# Patient Record
Sex: Female | Born: 1955
Health system: Southern US, Community
[De-identification: ages and names within clinical notes are randomized; demographics above are authoritative.]

## PROBLEM LIST (undated history)

## (undated) DIAGNOSIS — F32A Depression, unspecified: Secondary | ICD-10-CM

## (undated) DIAGNOSIS — M1990 Unspecified osteoarthritis, unspecified site: Secondary | ICD-10-CM

## (undated) DIAGNOSIS — I10 Essential (primary) hypertension: Secondary | ICD-10-CM

## (undated) DIAGNOSIS — M069 Rheumatoid arthritis, unspecified: Secondary | ICD-10-CM

## (undated) DIAGNOSIS — F329 Major depressive disorder, single episode, unspecified: Secondary | ICD-10-CM

## (undated) HISTORY — DX: Unspecified osteoarthritis, unspecified site: M19.90

## (undated) HISTORY — PX: BREAST SURGERY: SHX581

## (undated) HISTORY — DX: Depression, unspecified: F32.A

## (undated) HISTORY — DX: Essential (primary) hypertension: I10

## (undated) HISTORY — DX: Rheumatoid arthritis, unspecified: M06.9

## (undated) HISTORY — DX: Major depressive disorder, single episode, unspecified: F32.9

## (undated) HISTORY — PX: BREAST EXCISIONAL BIOPSY: SUR124

## (undated) MED FILL — Adalimumab Auto-injector Kit 40 MG/0.4ML: SUBCUTANEOUS | Fill #0 | Status: CN

---

## 2019-01-08 ENCOUNTER — Ambulatory Visit (INDEPENDENT_AMBULATORY_CARE_PROVIDER_SITE_OTHER): Payer: PRIVATE HEALTH INSURANCE | Admitting: Family Medicine

## 2019-01-08 ENCOUNTER — Encounter: Payer: Self-pay | Admitting: Family Medicine

## 2019-01-08 VITALS — BP 112/75 | HR 79 | Temp 98.4°F | Ht 63.5 in | Wt 219.0 lb

## 2019-01-08 DIAGNOSIS — Z114 Encounter for screening for human immunodeficiency virus [HIV]: Secondary | ICD-10-CM

## 2019-01-08 DIAGNOSIS — I10 Essential (primary) hypertension: Secondary | ICD-10-CM | POA: Insufficient documentation

## 2019-01-08 DIAGNOSIS — Z1211 Encounter for screening for malignant neoplasm of colon: Secondary | ICD-10-CM

## 2019-01-08 DIAGNOSIS — M2550 Pain in unspecified joint: Secondary | ICD-10-CM | POA: Diagnosis not present

## 2019-01-08 DIAGNOSIS — Z800 Family history of malignant neoplasm of digestive organs: Secondary | ICD-10-CM | POA: Insufficient documentation

## 2019-01-08 DIAGNOSIS — Z8349 Family history of other endocrine, nutritional and metabolic diseases: Secondary | ICD-10-CM | POA: Insufficient documentation

## 2019-01-08 DIAGNOSIS — Z1159 Encounter for screening for other viral diseases: Secondary | ICD-10-CM

## 2019-01-08 MED ORDER — LOSARTAN POTASSIUM-HCTZ 100-12.5 MG PO TABS: 1.0000 | ORAL_TABLET | Freq: Every day | ORAL | 1 refills | Status: DC

## 2019-01-08 NOTE — Patient Instructions (Signed)
STOP the Amlodipine START the increased dose of Losartan/ HCTZ (100/12.5mg ).  Take ONE tablet a day.  Return in 1 week for recheck lab and blood pressure with the nurse.  We discussed that you are on high dose of the celebrex. We are checking labs to look for autoimmune arthritis like rheumatoid or lupus arthritis today.  If we find that you are positive for these markers in your blood, I will place the referral to the specialist.  I will contact you within the next 3 days with your lab results.  Schedule your mammogram when you check out today.  I am placing referral to the GI doctor to do your colonoscopy.

## 2019-01-08 NOTE — Progress Notes (Signed)
Subjective: WG:[id number] care,  HTN HPI: Jean Howell is a 63 y.o. female presenting to clinic today for:  1. HTN Patient with hypertension that was diagnosed 1 year ago.  She had been lost to follow-up for many years prior to that so unsure as to how long she has had elevated blood pressures.  She is been under good control with losartan 50/12.5 mg and Norvasc 10 mg daily.  She denies any chest pain, shortness of breath, lower extremity edema but does note she has "tingling in both legs" since starting Norvasc 10 mg.  She does not find that amitriptyline changes this.  She is on chronic NSAID with Celebrex for arthritis.  She is a non-smoker.  No known family history of heart disease but there was a history of CVA in her mother she thinks in her mother 1s.  2.  Polyarthralgia Patient reports longstanding history of polyarthritis.  She feels like symptoms got worse in February of last year and she was started on Celebrex by her previous PCP.  She currently takes 200 mg p.o. twice daily and amitriptyline 25 mg nightly.  She does report some improvement at nighttime because the amitriptyline helps her sleep.  However, during the daytime she continues to have arthritic pain.  She wears braces in bilateral hands.  She reports pain is present in bilateral hands, fingers, wrists, knees and left ankle.  She has some joint swelling within the hands.  She has pins-and-needles sensation in bilateral lower extremities but does not endorse any numbness or tingling in the hands.  She does not work and she attributes this to symptomatic arthritis.  3. Preventative care Patient has never had a colonoscopy but does report that there was a history of colon cancer in her father.  Denies any rectal bleeding, early satiety, unplanned weight loss.  She has not had a mammogram or Pap smear in some time now.  She declines all vaccinations.  Unsure if she is ever been screened for HIV or hepatitis C.  Past Medical  History:  Diagnosis Date  . Arthritis   . Depression   . Hypertension    Past Surgical History:  Procedure Laterality Date  . BREAST SURGERY     nodules removed   Social History   Socioeconomic History  . Marital status: Widowed    Spouse name: Not on file  . Number of children: 1  . Years of education: Not on file  . Highest education level: Not on file  Occupational History  . Not on file  Social Needs  . Financial resource strain: Not on file  . Food insecurity:    Worry: Not on file    Inability: Not on file  . Transportation needs:    Medical: Not on file    Non-medical: Not on file  Tobacco Use  . Smoking status: Former Smoker    Packs/day: 1.00    Years: 15.00    Pack years: 15.00    Types: Cigarettes    Last attempt to quit: 2000    Years since quitting: 20.1  Substance and Sexual Activity  . Alcohol use: Not Currently  . Drug use: Never  . Sexual activity: Not Currently    Comment: widowed  Lifestyle  . Physical activity:    Days per week: Not on file    Minutes per session: Not on file  . Stress: Not on file  Relationships  . Social connections:    Talks on phone: Not on file  Gets together: Not on file    Attends religious service: Not on file    Active member of club or organization: Not on file    Attends meetings of clubs or organizations: Not on file    Relationship status: Not on file  . Intimate partner violence:    Fear of current or ex partner: Not on file    Emotionally abused: Not on file    Physically abused: Not on file    Forced sexual activity: Not on file  Other Topics Concern  . Not on file  Social History Narrative   Patient is a widow that resides independently in [place].  She has 1 son, who resides in [place].   Current Meds  Medication Sig  . amitriptyline (ELAVIL) 25 MG tablet Take 25 mg by mouth at bedtime.  . celecoxib (CELEBREX) 200 MG capsule Take 200 mg by mouth 2 (two) times daily.  . [DISCONTINUED]  amLODipine (NORVASC) 10 MG tablet Take 10 mg by mouth daily.  . [DISCONTINUED] losartan-hydrochlorothiazide (HYZAAR) 50-12.5 MG tablet Take 1 tablet by mouth daily.   Family History  Problem Relation Age of Onset  . Cancer Father   . Colon cancer Father   . Thyroid disease Mother   . CVA Mother 37  . Thyroid disease Sister    Allergies not on file   Health Maintenance: Colonscopy, declines vaccines. Mammogram and pap also due.  ROS: Per HPI  Objective: Office vital signs reviewed. BP 112/75   Pulse 79   Temp 98.4 F (36.9 C) (Oral)   Ht 5' 3.5" (1.613 m)   Wt 219 lb (99.3 kg)   BMI 38.19 kg/m   Physical Examination:  General: Awake, alert, well nourished, No acute distress HEENT: Normal, sclera white, MMM Cardio: regular rate and rhythm, S1S2 heard, no murmurs appreciated Pulm: clear to auscultation bilaterally, no wheezes, rhonchi or rales; normal work of breathing on room air Extremities: warm, well perfused, No edema, cyanosis or clubbing; +2 pulses bilaterally MSK: slow/stiff gait and station  Hands: She has hyperpigmentation and joint swelling noted within bilateral DIP joints of the third fingers and in the PIP joint of the second finger of the left hand.  No increased warmth.  Range of motion is somewhat limited secondary to stiffness. Skin: dry; intact; no rashes or lesions Neuro: light touch sensation grossly in tact Psych: mood stable, speech normal.  Pleasant. Depression screen [facility] 2/9 01/08/2019  Decreased Interest 0  Down, Depressed, Hopeless 0  PHQ - 2 Score 0  Altered sleeping 0  Tired, decreased energy 0  Change in appetite 0  Feeling bad or failure about yourself  0  Trouble concentrating 0  Moving slowly or fidgety/restless 0  Suicidal thoughts 0  PHQ-9 Score 0    Assessment/ Plan: 63 y.o. female   1. Essential hypertension Under excellent control but patient feels that the Norvasc has caused side effects within the lower extremities.  We will  trial off of the Norvasc though if and instructed her to keep the medication for now.  Will increase losartan to 100 mg with remaining 12.5 mg of hydrochlorothiazide.  We discussed this dose change.  New prescription has been sent to pharmacy.  She will return in 1 week for BMP check.  In the meantime, will check nonfasting labs of A1c, lipid panel, metabolic panel.  I will contact you with results once available - Lipid Panel - Bayer DCA Hb A1c Waived - CMP14+EGFR - losartan-hydrochlorothiazide (HYZAAR) 100-12.5  MG tablet; Take 1 tablet by mouth daily.  Dispense: 30 tablet; Refill: 1  2. Morbid obesity ([place]) - TSH - Bayer DCA Hb A1c Waived  3. Family history of thyroid disease - TSH  4. Polyarthralgia ?  Autoimmune arthritis given joint changes within the DIP joints of bilateral hands.  Check ANA, RF, sed rate and CRP.  For now, continue Celebrex at max dose and amitriptyline - amitriptyline (ELAVIL) 25 MG tablet; Take 25 mg by mouth at bedtime. - CMP14+EGFR - CBC - ANA w/Reflex if Positive - Rheumatoid factor - Sedimentation Rate - C-reactive protein  5. Encounter for hepatitis C screening test for low risk patient - Hepatitis C antibody  6. Screening for HIV without presence of risk factors - HIV antibody (with reflex)  7. Screen for colon cancer - Ambulatory referral to Gastroenterology  8. Family history of colon cancer - Ambulatory referral to Gastroenterology   [name], [place] [phone]

## 2019-01-09 ENCOUNTER — Other Ambulatory Visit: Payer: Self-pay | Admitting: Family Medicine

## 2019-01-09 DIAGNOSIS — M0580 Other rheumatoid arthritis with rheumatoid factor of unspecified site: Secondary | ICD-10-CM

## 2019-01-09 LAB — BAYER DCA HB A1C WAIVED: HB A1C (BAYER DCA - WAIVED): 5.3 % (ref <7.0)

## 2019-01-09 NOTE — Progress Notes (Signed)
r 

## 2019-01-11 LAB — HEPATITIS C ANTIBODY: Hep C Virus Ab: <0.1 s/co ratio (ref 0.0–0.9)

## 2019-01-11 LAB — CMP14+EGFR
ALT: 13 IU/L (ref 0–32)
AST: 12 IU/L (ref 0–40)
Albumin/Globulin Ratio: 0.9 — ABNORMAL LOW (ref 1.2–2.2)
Albumin: 3.7 g/dL — ABNORMAL LOW (ref 3.8–4.8)
Alkaline Phosphatase: 90 IU/L (ref 39–117)
BUN/Creatinine Ratio: 19 (ref 12–28)
BUN: 25 mg/dL (ref 8–27)
Bilirubin Total: 0.3 mg/dL (ref 0.0–1.2)
CHLORIDE: 99 mmol/L (ref 96–106)
CO2: 25 mmol/L (ref 20–29)
Calcium: 9.4 mg/dL (ref 8.7–10.3)
Creatinine, Ser: 1.35 mg/dL — ABNORMAL HIGH (ref 0.57–1.00)
GFR calc Af Amer: 48 mL/min/{1.73_m2} — ABNORMAL LOW (ref >59)
GFR calc non Af Amer: 42 mL/min/{1.73_m2} — ABNORMAL LOW (ref >59)
Globulin, Total: 3.9 g/dL (ref 1.5–4.5)
Glucose: 98 mg/dL (ref 65–99)
Potassium: 4.3 mmol/L (ref 3.5–5.2)
Sodium: 141 mmol/L (ref 134–144)
Total Protein: 7.6 g/dL (ref 6.0–8.5)

## 2019-01-11 LAB — ANA W/REFLEX IF POSITIVE
Anti JO-1: <0.2 AI (ref 0.0–0.9)
Anti Nuclear Antibody(ANA): POSITIVE — AB
Centromere Ab Screen: <0.2 AI (ref 0.0–0.9)
Chromatin Ab SerPl-aCnc: <0.2 AI (ref 0.0–0.9)
ENA RNP Ab: <0.2 AI (ref 0.0–0.9)
ENA SM Ab Ser-aCnc: <0.2 AI (ref 0.0–0.9)
ENA SSA (RO) Ab: <0.2 AI (ref 0.0–0.9)
ENA SSB (LA) Ab: <0.2 AI (ref 0.0–0.9)
Scleroderma SCL-70: 1.7 AI — ABNORMAL HIGH (ref 0.0–0.9)
dsDNA Ab: <1 IU/mL (ref 0–9)

## 2019-01-11 LAB — SEDIMENTATION RATE: Sed Rate: 1 mm/hr (ref 0–40)

## 2019-01-11 LAB — CBC
HEMATOCRIT: 34.5 % (ref 34.0–46.6)
Hemoglobin: 11.5 g/dL (ref 11.1–15.9)
MCH: 26.9 pg (ref 26.6–33.0)
MCHC: 33.3 g/dL (ref 31.5–35.7)
MCV: 81 fL (ref 79–97)
Platelets: 456 10*3/uL — ABNORMAL HIGH (ref 150–450)
RBC: 4.27 x10E6/uL (ref 3.77–5.28)
RDW: 15.3 % (ref 11.7–15.4)
WBC: 7.7 10*3/uL (ref 3.4–10.8)

## 2019-01-11 LAB — HIV ANTIBODY (ROUTINE TESTING W REFLEX): HIV Screen 4th Generation wRfx: NONREACTIVE

## 2019-01-11 LAB — LIPID PANEL
Chol/HDL Ratio: 3 ratio (ref 0.0–4.4)
Cholesterol, Total: 168 mg/dL (ref 100–199)
HDL: 56 mg/dL (ref >39)
LDL Calculated: 97 mg/dL (ref 0–99)
Triglycerides: 77 mg/dL (ref 0–149)
VLDL CHOLESTEROL CAL: 15 mg/dL (ref 5–40)

## 2019-01-11 LAB — TSH: TSH: 1.16 u[IU]/mL (ref 0.450–4.500)

## 2019-01-11 LAB — RHEUMATOID FACTOR: Rheumatoid fact SerPl-aCnc: 100.6 IU/mL — ABNORMAL HIGH (ref 0.0–13.9)

## 2019-01-11 LAB — C-REACTIVE PROTEIN: CRP: 47 mg/L — ABNORMAL HIGH (ref 0–10)

## 2019-01-15 ENCOUNTER — Ambulatory Visit (INDEPENDENT_AMBULATORY_CARE_PROVIDER_SITE_OTHER): Payer: PRIVATE HEALTH INSURANCE | Admitting: *Deleted

## 2019-01-15 VITALS — BP 127/82 | HR 83

## 2019-01-15 DIAGNOSIS — Z0130 Encounter for examination of blood pressure without abnormal findings: Secondary | ICD-10-CM

## 2019-01-15 NOTE — Progress Notes (Signed)
  Bp here today for Bp Check Blood pressure reading 127/82 pulse 83

## 2019-01-15 NOTE — Progress Notes (Signed)
BP under good control.  Ok to stay off Norvasc and continue ARB/ HCTZ combo.

## 2019-01-30 LAB — HM MAMMOGRAPHY

## 2019-02-09 ENCOUNTER — Other Ambulatory Visit: Payer: Self-pay | Admitting: Family Medicine

## 2019-02-09 DIAGNOSIS — I10 Essential (primary) hypertension: Secondary | ICD-10-CM

## 2019-02-12 ENCOUNTER — Encounter: Payer: Self-pay | Admitting: Family Medicine

## 2019-02-12 ENCOUNTER — Ambulatory Visit (INDEPENDENT_AMBULATORY_CARE_PROVIDER_SITE_OTHER): Payer: PRIVATE HEALTH INSURANCE | Admitting: Family Medicine

## 2019-02-12 VITALS — BP 127/79 | HR 89 | Temp 98.4°F | Ht 63.5 in | Wt 229.0 lb

## 2019-02-12 DIAGNOSIS — R768 Other specified abnormal immunological findings in serum: Secondary | ICD-10-CM | POA: Diagnosis not present

## 2019-02-12 DIAGNOSIS — R7689 Other specified abnormal immunological findings in serum: Secondary | ICD-10-CM | POA: Insufficient documentation

## 2019-02-12 DIAGNOSIS — I10 Essential (primary) hypertension: Secondary | ICD-10-CM

## 2019-02-12 DIAGNOSIS — M2550 Pain in unspecified joint: Secondary | ICD-10-CM

## 2019-02-12 MED ORDER — LOSARTAN POTASSIUM-HCTZ 100-12.5 MG PO TABS: ORAL_TABLET | ORAL | 0 refills | Status: DC

## 2019-02-12 NOTE — Progress Notes (Signed)
Subjective: CC: Arthritis, HTN HPI: Jean Howell is a 63 y.o. female presenting to clinic today for:  1. HTN Reports compliance with losartan hydrochlorothiazide.  Doing well after discontinuation of Norvasc.  No chest pain, shortness of breath, dizziness.  2.  Polyarthralgia Noted to have positive rheumatoid markers, positive ANA, elevated CRP and mildly elevated platelets.  Referral to rheumatology in place and patient aware of appointment.  She continues to have various aches and pains but states that the arthritis actually seems a little bit better than last visit.  3. Preventative care Scheduled to see GI for colonoscopy.  Past Medical History:  Diagnosis Date  . Arthritis   . Depression   . Hypertension    Past Surgical History:  Procedure Laterality Date  . BREAST SURGERY     nodules removed   Social History   Socioeconomic History  . Marital status: Widowed    Spouse name: Not on file  . Number of children: 1  . Years of education: Not on file  . Highest education level: Not on file  Occupational History  . Not on file  Social Needs  . Financial resource strain: Not on file  . Food insecurity:    Worry: Not on file    Inability: Not on file  . Transportation needs:    Medical: Not on file    Non-medical: Not on file  Tobacco Use  . Smoking status: Former Smoker    Packs/day: 1.00    Years: 15.00    Pack years: 15.00    Types: Cigarettes    Last attempt to quit: 2000    Years since quitting: 20.2  . Smokeless tobacco: Never Used  Substance and Sexual Activity  . Alcohol use: Not Currently  . Drug use: Never  . Sexual activity: Not Currently    Comment: widowed  Lifestyle  . Physical activity:    Days per week: Not on file    Minutes per session: Not on file  . Stress: Not on file  Relationships  . Social connections:    Talks on phone: Not on file    Gets together: Not on file    Attends religious service: Not on file    Active member of  club or organization: Not on file    Attends meetings of clubs or organizations: Not on file    Relationship status: Not on file  . Intimate partner violence:    Fear of current or ex partner: Not on file    Emotionally abused: Not on file    Physically abused: Not on file    Forced sexual activity: Not on file  Other Topics Concern  . Not on file  Social History Narrative   Patient is a widow that resides independently in [place].  She has 1 son, who resides in [place].   No outpatient medications have been marked as taking for the 02/12/19 encounter (Appointment) with [name], DO.   Family History  Problem Relation Age of Onset  . Cancer Father   . Colon cancer Father   . Thyroid disease Mother   . CVA Mother 23  . Thyroid disease Sister    No Known Allergies   Health Maintenance: Colonscopy, declines vaccines. Mammogram and pap also due.  ROS: Per HPI  Objective: Office vital signs reviewed. BP 127/79   Pulse 89   Temp 98.4 F (36.9 C) (Oral)   Ht 5' 3.5" (1.613 m)   Wt 229 lb (103.9  kg)   BMI 39.93 kg/m   Physical Examination:  General: Awake, alert, well nourished, No acute distress HEENT: Normal, sclera white, MMM Cardio: regular rate and rhythm, S1S2 heard, no murmurs appreciated Pulm: clear to auscultation bilaterally, no wheezes, rhonchi or rales; normal work of breathing on room air Extremities: warm, well perfused, No edema, cyanosis or clubbing; +2 pulses bilaterally Psych: mood stable, speech normal.  Pleasant. Depression screen [facility] 2/9 02/12/2019 01/08/2019  Decreased Interest 0 0  Down, Depressed, Hopeless 0 0  PHQ - 2 Score 0 0  Altered sleeping 0 0  Tired, decreased energy 0 0  Change in appetite 0 0  Feeling bad or failure about yourself  0 0  Trouble concentrating 0 0  Moving slowly or fidgety/restless 0 0  Suicidal thoughts 0 0  PHQ-9 Score 0 0    Assessment/ Plan: 63 y.o. female   1. Essential hypertension Under  excellent control despite discontinuation of Norvasc.  Refill of losartan/hydrochlorthiazide sent to pharmacy.  Follow-up in 3 months.  Plan for repeat renal panel.  Advised to discontinue twice daily dosing of Celebrex. - losartan-hydrochlorothiazide (HYZAAR) 100-12.5 MG tablet; TAKE ONE (1) TABLET EACH DAY  Dispense: 90 tablet; Refill: 0  2. Polyarthralgia Okay to continue once daily dosing of Celebrex if needed.  Plan for repeat renal function panel at next visit.  Polyarthralgia likely due to underlying rheumatoid arthritis given positive RA latex and ANA.  She has an appointment scheduled with rheumatology in 2 weeks.  Follow-up in 3 months with me.  3. ANA positive  4. Rheumatoid factor positive   [name], DO [place] [phone]

## 2019-02-12 NOTE — Progress Notes (Signed)
Office Visit Note  Patient: Jean Howell             Date of Birth: [birth year]           MRN: [id number]             PCP: [name], DO Referring: [name], DO Visit Date: 02/26/2019 Occupation: @GUAROCC @  Subjective:  Pain in multiple joints.   History of Present Illness: Jean Howell is a 63 y.o. femalewith history of inflammatory arthritis and positive rheumatoid factor.  According to patient about 1 year ago she started experiencing morning stiffness.  She states she had difficulty getting out of the bed.  She states she was experiencing pain in her shoulders, wrist joints, hands, knees and ankles for many months now.  She also had some neck and lower back pain.  She states she suffered for a while and took [facility] powders Tylenol and over-the-counter medications.  She states in April 2019 she was seen by her PCP at the time she was placed on Celebrex and Tylenol combination.  She states she felt some better but her symptoms never did resolve.  She continued to have joint pain and joint swelling.  She states recently she has been experiencing a lot of discomfort in her joints and swelling in her both hands her right knee joint and left ankle joint.  She has difficulty doing routine activities.  Activities of Daily Living:  Patient reports morning stiffness for 4 hours.   Patient Reports nocturnal pain.  Difficulty dressing/grooming: Reports Difficulty climbing stairs: Reports Difficulty getting out of chair: Reports Difficulty using hands for taps, buttons, cutlery, and/or writing: Reports  Review of Systems  Constitutional: Positive for fatigue. Negative for night sweats, weight gain and weight loss.  HENT: Positive for mouth dryness. Negative for mouth sores, trouble swallowing, trouble swallowing and nose dryness.   Eyes: Positive for dryness. Negative for pain, redness and visual disturbance.  Respiratory: Negative for cough, shortness of breath and difficulty  breathing.   Cardiovascular: Negative for chest pain, palpitations, hypertension, irregular heartbeat and swelling in legs/feet.  Gastrointestinal: Positive for constipation. Negative for blood in stool and diarrhea.  Endocrine: Negative for increased urination.  Genitourinary: Negative for vaginal dryness.  Musculoskeletal: Positive for arthralgias, joint pain, joint swelling and morning stiffness. Negative for myalgias, muscle weakness, muscle tenderness and myalgias.  Skin: Negative for color change, rash, hair loss, skin tightness, ulcers and sensitivity to sunlight.  Allergic/Immunologic: Negative for susceptible to infections.  Neurological: Negative for dizziness, memory loss, night sweats and weakness.  Hematological: Negative for swollen glands.  Psychiatric/Behavioral: Positive for depressed mood and sleep disturbance. The patient is not nervous/anxious.     PMFS History:  Patient Active Problem List   Diagnosis Date Noted  . ANA positive 02/12/2019  . Family history of thyroid disease 01/08/2019  . Morbid obesity ([place]) 01/08/2019  . Essential hypertension 01/08/2019  . Polyarthralgia 01/08/2019  . Family history of colon cancer 01/08/2019    Past Medical History:  Diagnosis Date  . Arthritis   . Depression   . Hypertension     Family History  Problem Relation Age of Onset  . Cancer Father   . Colon cancer Father   . Thyroid disease Mother   . CVA Mother 70  . Thyroid disease Sister   . Healthy Son   . Congestive Heart Failure Sister   . Hypertension Sister   . Diabetes Sister   . Hypertension Sister   .  Diabetes Sister   . Hypertension Sister   . Hypertension Sister   . Cancer Brother        kidney    Past Surgical History:  Procedure Laterality Date  . BREAST SURGERY     nodules removed   Social History   Social History Narrative   Patient is a widow that resides independently in [place].  She has 1 son, who resides in [place].    There  is no immunization history on file for this patient.   Objective: Vital Signs: BP (!) 145/102 (BP Location: Right Arm, Patient Position: Sitting, Cuff Size: Normal)   Pulse 76   Resp 14   Ht 5' 4.5" (1.638 m)   Wt 229 lb (103.9 kg)   BMI 38.70 kg/m    Physical Exam Vitals signs and nursing note reviewed.  Constitutional:      Appearance: She is well-developed.  HENT:     Head: Normocephalic and atraumatic.  Eyes:     Conjunctiva/sclera: Conjunctivae normal.  Neck:     Musculoskeletal: Normal range of motion.  Cardiovascular:     Rate and Rhythm: Normal rate and regular rhythm.     Heart sounds: Normal heart sounds.  Pulmonary:     Effort: Pulmonary effort is normal.     Breath sounds: Rales present.     Comments: Bilateral lung bases Abdominal:     General: Bowel sounds are normal.     Palpations: Abdomen is soft.  Lymphadenopathy:     Cervical: No cervical adenopathy.  Skin:    General: Skin is warm and dry.     Capillary Refill: Capillary refill takes less than 2 seconds.  Neurological:     Mental Status: She is alert and oriented to person, place, and time.  Psychiatric:        Behavior: Behavior normal.      Musculoskeletal Exam: C-spine her lumbar spine limited range of motion.  She has painful range of motion of bilateral shoulder joints.  Elbow joints with good range of motion.  Wrist joints with good range of motion without any swelling.  She has some tenderness that is across MCPs and PIPs without any swelling.  She has right second DIP thickening.  Hip joints with good range of motion.  Right knee joint had limited extension with warmth and swelling.  Left ankle joint was also warm and swollen.  She is some tenderness across MTPs and PIPs but no synovitis was noted.  CDAI Exam: CDAI Score: Not documented Patient Global Assessment: Not documented; Provider Global Assessment: Not documented Swollen: Not documented; Tender: Not documented Joint Exam   Not  documented   There is currently no information documented on the homunculus. Go to the Rheumatology activity and complete the homunculus joint exam.  Investigation: Findings:  01/08/19: RF 100.6, sed rate 1, CRP 47, Scl-70 1.7, ANA+, dsDNA-, RNP-, Ro-, La-, smith-, Hep C ab-, HIV-  Component     Latest Ref Rng & Units 01/08/2019  Anti Nuclear Antibody(ANA)     Negative Positive (A)  dsDNA Ab     0 - 9 IU/mL <1  ENA RNP Ab     0.0 - 0.9 AI <0.2  ENA SM Ab Ser-aCnc     0.0 - 0.9 AI <0.2  Scleroderma SCL-70     0.0 - 0.9 AI 1.7 (H)  ENA SSA (RO) Ab     0.0 - 0.9 AI <0.2  ENA SSB (LA) Ab  0.0 - 0.9 AI <0.2  Chromatin Ab SerPl-aCnc     0.0 - 0.9 AI <0.2  Anti JO-1     0.0 - 0.9 AI <0.2  CENTROMERE AB SCREEN     0.0 - 0.9 AI <0.2  SEE BELOW      Comment  RA Latex Turbid.     0.0 - 13.9 IU/mL 100.6 (H)  Sed Rate     0 - 40 mm/hr 1  CRP     0 - 10 mg/L 47 (H)  Hep C Virus Ab     0.0 - 0.9 s/co ratio <0.1  HIV Screen 4th Generation wRfx     Non Reactive Non Reactive   Imaging: Hm Mammography  Result Date: 01/30/2019 [facility] Cat 2   Recent Labs: Lab Results  Component Value Date   WBC 7.7 01/08/2019   HGB 11.5 01/08/2019   PLT 456 (H) 01/08/2019   NA 141 01/08/2019   K 4.3 01/08/2019   CL 99 01/08/2019   CO2 25 01/08/2019   GLUCOSE 98 01/08/2019   BUN 25 01/08/2019   CREATININE 1.35 (H) 01/08/2019   BILITOT 0.3 01/08/2019   ALKPHOS 90 01/08/2019   AST 12 01/08/2019   ALT 13 01/08/2019   PROT 7.6 01/08/2019   ALBUMIN 3.7 (L) 01/08/2019   CALCIUM 9.4 01/08/2019   GFRAA 48 (L) 01/08/2019    Speciality Comments: No specialty comments available.  Procedures:  No procedures performed Allergies: Patient has no known allergies.   Assessment / Plan:     Visit Diagnoses: Rheumatoid factor positive - 01/08/19: RF 100.6, sed rate 1, CRP 47, Scl-70 1.7, ANA+, dsDNA-, RNP-, Ro-, La-, smith-, Hep C ab-, HIV-.  Patient is  significantly high rheumatoid factor.  She also complains of significant morning stiffness generalized pain and intermittent swelling in multiple joints.  She had swelling in her right knee and left ankle today.  Most likely diagnosis is rheumatoid arthritis.  I will obtain some additional labs and x-rays today.  Chronic pain of both shoulders -she had painful range of motion of bilateral shoulders today.  Plan: XR Shoulder Left  Chronic right shoulder pain - Plan: XR Shoulder Right.  The x-ray of shoulder joints were unremarkable.  Pain in both hands -painful range of motion of bilateral hands with tenderness over MCPs and PIPs.  No obvious synovitis was noted.  She is in complete fist formation with the right hand.  Plan: XR Hand 2 View Right, XR Hand 2 View Left, 14-3-3 eta Protein, Rheumatoid factor.  X-rays were consistent with rheumatoid arthritis and osteoarthritis overlap.  Chronic pain of both knees -she has warmth and swelling in her right knee joint.  Plan: XR KNEE 3 VIEW RIGHT, XR KNEE 3 VIEW LEFT.  Right knee joint showed severe osteoarthritis and inflammatory arthritis .  Left knee joint showed moderate osteoarthritis.  Pain in both feet -she had tenderness across MTPs and PIPs.  She also had left ankle joint swelling.  Plan: XR Foot 2 Views Right, XR Foot 2 Views Left.  X-rays were consistent with inflammatory and osteoarthritis overlap.  Former smoker-patient had crackles in her lung bases.  Due to pandemic issue currently I would hold off x-ray of her chest until next visit.  I plan to obtain this x-ray next visit.  High risk medication use - Plan: Urinalysis, Routine w reflex microscopic, Hepatitis B core antibody, IgM, Hepatitis B surface antigen, HIV Antibody (routine testing w  rflx), QuantiFERON-TB Gold Plus, Serum protein electrophoresis with reflex, IgG, IgA, IgM, Glucose 6 phosphate dehydrogenase  Other fatigue - Plan: CK   ANA positive  Essential hypertension  History  of depression  Family history of thyroid disease  Family history of colon cancer    Orders: Orders Placed This Encounter  Procedures  . XR Shoulder Left  . XR Shoulder Right  . XR Hand 2 View Right  . XR Hand 2 View Left  . XR KNEE 3 VIEW RIGHT  . XR KNEE 3 VIEW LEFT  . XR Foot 2 Views Right  . XR Foot 2 Views Left  . Urinalysis, Routine w reflex microscopic  . CK  . 14-3-3 eta Protein  . Rheumatoid factor  . Hepatitis B core antibody, IgM  . Hepatitis B surface antigen  . QuantiFERON-TB Gold Plus  . Serum protein electrophoresis with reflex  . IgG, IgA, IgM  . Glucose 6 phosphate dehydrogenase   No orders of the defined types were placed in this encounter.   Face-to-face time spent with patient was 50 minutes. Greater than 50% of time was spent in counseling and coordination of care.  Follow-Up Instructions: Return for arthritis and positive rheumatoid factor.   Bo Merino, MD  Note - This record has been created using Editor, commissioning.  Chart creation errors have been sought, but may not always  have been located. Such creation errors do not reflect on  the standard of medical care.

## 2019-02-12 NOTE — Patient Instructions (Signed)
Blood pressure looks good on just the Losartan/ hydrochlorothiazide pill.  I refilled this today.  Take ONLY 1 capsule of the celebrex a day.  I think this is what may have caused your kidney function issue last check.  We will recheck your kidney function at your next visit.  See me in 3 months for recheck.   Rheumatoid Arthritis Rheumatoid arthritis (RA) is a long-term (chronic) disease. RA causes inflammation in your joints. Your joints may feel painful, stiff, swollen, and warm. RA may start slowly. It most often affects the small joints of the hands and feet. It can also affect other parts of the body. Symptoms of RA often come and go. There is no cure for RA, but medicines can help your symptoms. What are the causes?  RA is an autoimmune disease. This means that your body's defense system (immune system) attacks healthy parts of your body by mistake. The exact cause of RA is not known. What increases the risk?  Being a woman.  Having a family history of RA or other diseases like RA.  Smoking.  Being overweight.  Being exposed to pollutants or chemicals. What are the signs or symptoms?  Morning stiffness that lasts longer than 30 minutes. This is often the first symptom.  Symptoms start slowly. They are often worse in the morning.  As RA gets worse, symptoms may include: ? Pain, stiffness, swelling, warmth, and tenderness in joints on both sides of your body. ? Loss of energy. ? Not feeling hungry. ? Weight loss. ? A low fever. ? Dry eyes and a dry mouth. ? Firm lumps that grow under your skin. ? Changes in the way your joints look. ? Changes in the way your joints work.  Symptoms vary and they: ? Often come and go. ? Sometimes get worse for a period of time. These are called flares. How is this treated?   Treatment may include: ? Taking good care of yourself. Be sure to rest as needed, eat a healthy diet, and exercise. ? Medicines. These may include:  Pain  relievers.  Medicines to help with inflammation.  Disease-modifying antirheumatic drugs (DMARDs).  Medicines called biologic response modifiers. ? Physical therapy and occupational therapy. ? Surgery, if joint damage is very bad. Your doctor will work with you to find the best treatments. Follow these instructions at home: Activity  Return to your normal activities as told by your doctor. Ask your doctor what activities are safe for you.  Rest when you have a flare.  Exercise as told by your doctor. General instructions  Take over-the-counter and prescription medicines only as told by your doctor.  Keep all follow-up visits as told by your doctor. This is important. Where to find more information  [employer]: www.rheumatology.[place]: www.arthritis.org Contact a doctor if:  You have a flare.  You have a fever.  You have problems because of your medicines. Get help right away if:  You have chest pain.  You have trouble breathing.  You get a hot, painful joint all of a sudden, and it is worse than your normal joint aches. Summary  RA is a long-term disease.  Symptoms of RA start slowly. They are often worse in the morning.  RA causes inflammation in your joints. This information is not intended to replace advice given to you by your health care provider. Make sure you discuss any questions you have with your health care provider. Document Released: 02/14/2012 Document Revised: 07/26/2018  Document Reviewed: 07/26/2018 Elsevier Interactive Patient Education  [employer].

## 2019-02-19 ENCOUNTER — Other Ambulatory Visit: Payer: Self-pay

## 2019-02-19 ENCOUNTER — Ambulatory Visit (INDEPENDENT_AMBULATORY_CARE_PROVIDER_SITE_OTHER): Payer: Self-pay | Admitting: *Deleted

## 2019-02-19 DIAGNOSIS — Z1211 Encounter for screening for malignant neoplasm of colon: Secondary | ICD-10-CM

## 2019-02-19 MED ORDER — NA SULFATE-K SULFATE-MG SULF 17.5-3.13-1.6 GM/177ML PO SOLN: 1.0000 | Freq: Once | ORAL | 0 refills | Status: AC

## 2019-02-19 NOTE — Patient Instructions (Addendum)
Jean Howell  1956/08/28 MRN: [id number]     Procedure Date: 06/25/2019 Time to register: 11:00 Place to register: [facility] Procedure Time: 12:00 Scheduled provider: Barney Drain, MD    PREPARATION FOR COLONOSCOPY WITH SUPREP BOWEL PREP KIT  Note: Suprep Bowel Prep Kit is a split-dose (2day) regimen. Consumption of BOTH 6-ounce bottles is required for a complete prep.  Please notify us immediately if you are diabetic, take iron supplements, or if you are on Coumadin or any other blood thinners.                                                                                                                                                 1 DAY BEFORE PROCEDURE:  DATE: 06/24/2019   DAY: Sunday clear liquids the entire day - NO SOLID FOOD.    At 6:00pm: Complete steps 1 through 4 below, using ONE (1) 6-ounce bottle, before going to bed. Step 1:  Pour ONE (1) 6-ounce bottle of SUPREP liquid into the mixing container.  Step 2:  Add cool drinking water to the 16 ounce line on the container and mix.  Note: Dilute the solution concentrate as directed prior to use. Step 3:  DRINK ALL the liquid in the container. Step 4:  You MUST drink an additional two (2) or more 16 ounce containers of water over the next one (1) hour.   Continue clear liquids.  DAY OF PROCEDURE:   DATE: 06/25/2019   DAY: Monday If you take medications for your heart, blood pressure, or breathing, you may take these medications.   5 hours before your procedure at 7:00: Step 1:  Pour ONE (1) 6-ounce bottle of SUPREP liquid into the mixing container.  Step 2:  Add cool drinking water to the 16 ounce line on the container and mix.  Note: Dilute the solution concentrate as directed prior to use. Step 3:  DRINK ALL the liquid in the container. Step 4:  You MUST drink an additional two (2) or more 16 ounce containers of water over the next one (1) hour. You MUST complete the final glass of water at least 3 hours  before your colonoscopy. Nothing by mouth past 9:00.  You may take your morning medications with sip of water unless we have instructed otherwise.    Please see below for Dietary Information.  CLEAR LIQUIDS INCLUDE:  Water Jello (NOT red in color)   Ice Popsicles (NOT red in color)   Tea (sugar ok, no milk/cream) Powdered fruit flavored drinks  Coffee (sugar ok, no milk/cream) Gatorade/ Lemonade/ Kool-Aid  (NOT red in color)   Juice: apple, white grape, white cranberry Soft drinks  Clear bullion, consomme, broth (fat free beef/chicken/vegetable)  Carbonated beverages (any kind)  Strained chicken noodle soup Hard Candy   Remember: Clear liquids are liquids that will allow you to  see your fingers on the other side of a clear glass. Be sure liquids are NOT red in color, and not cloudy, but CLEAR.  DO NOT EAT OR DRINK ANY OF THE FOLLOWING:  Dairy products of any kind   Cranberry juice Tomato juice / V8 juice   Grapefruit juice Orange juice     Red grape juice  Do not eat any solid foods, including such foods as: cereal, oatmeal, yogurt, fruits, vegetables, creamed soups, eggs, bread, crackers, pureed foods in a blender, etc.   HELPFUL HINTS FOR DRINKING PREP SOLUTION:   Make sure prep is extremely cold. Mix and refrigerate the the morning of the prep. You may also put in the freezer.   You may try mixing some Crystal Light or Country Time Lemonade if you prefer. Mix in small amounts; add more if necessary.  Try drinking through a straw  Rinse mouth with water or a mouthwash between glasses, to remove after-taste.  Try sipping on a cold beverage /ice/ popsicles between glasses of prep.  Place a piece of sugar-free hard candy in mouth between glasses.  If you become nauseated, try consuming smaller amounts, or stretch out the time between glasses. Stop for 30-60 minutes, then slowly start back drinking.     OTHER INSTRUCTIONS  You will need a responsible adult at least 65  years of age to accompany you and drive you home. This person must remain in the waiting room during your procedure. The hospital will cancel your procedure if you do not have a responsible adult with you.   1. Wear loose fitting clothing that is easily removed. 2. Leave jewelry and other valuables at home.  3. Remove all body piercing jewelry and leave at home. 4. Total time from sign-in until discharge is approximately 2-3 hours. 5. You should go home directly after your procedure and rest. You can resume normal activities the day after your procedure. 6. The day of your procedure you should not:  Drive  Make legal decisions  Operate machinery  Drink alcohol  Return to work   You may call the office (Dept: [phone]) before 5:00pm, or page the doctor on call [phone]) after 5:00pm, for further instructions, if necessary.   Insurance Information YOU WILL NEED TO CHECK WITH YOUR INSURANCE COMPANY FOR THE BENEFITS OF COVERAGE YOU HAVE FOR THIS PROCEDURE.  UNFORTUNATELY, NOT ALL INSURANCE COMPANIES HAVE BENEFITS TO COVER ALL OR PART OF THESE TYPES OF PROCEDURES.  IT IS YOUR RESPONSIBILITY TO CHECK YOUR BENEFITS, HOWEVER, WE WILL BE GLAD TO ASSIST YOU WITH ANY CODES YOUR INSURANCE COMPANY MAY NEED.    PLEASE NOTE THAT MOST INSURANCE COMPANIES WILL NOT COVER A SCREENING COLONOSCOPY FOR PEOPLE UNDER THE AGE OF 50  IF YOU HAVE BCBS INSURANCE, YOU MAY HAVE BENEFITS FOR A SCREENING COLONOSCOPY BUT IF POLYPS ARE FOUND THE DIAGNOSIS WILL CHANGE AND THEN YOU MAY HAVE A DEDUCTIBLE THAT WILL NEED TO BE MET. SO PLEASE MAKE SURE YOU CHECK YOUR BENEFITS FOR A SCREENING COLONOSCOPY AS WELL AS A DIAGNOSTIC COLONOSCOPY.

## 2019-02-19 NOTE — Progress Notes (Addendum)
Gastroenterology Pre-Procedure Review  Request Date: 02/19/2019 Requesting Physician: Adam Phenix [facility]  PATIENT REVIEW QUESTIONS: The patient responded to the following health history questions as indicated:    1. Diabetes Melitis: no 2. Joint replacements in the past 12 months: no 3. Major health problems in the past 3 months: no 4. Has an artificial valve or MVP: no 5. Has a defibrillator: no 6. Has been advised in past to take antibiotics in advance of a procedure like teeth cleaning: no 7. Family history of colon cancer: no  8. Alcohol Use: no 9. History of sleep apnea: no  10. History of coronary artery or other vascular stents placed within the last 12 months: no 11. History of any prior anesthesia complications: no    MEDICATIONS & ALLERGIES:    Patient reports the following regarding taking any blood thinners:   Plavix? no Aspirin? no Coumadin? no Brilinta? no Xarelto? no Eliquis? no Pradaxa? no Savaysa? no Effient? no  Patient confirms/reports the following medications:  Current Outpatient Medications  Medication Sig Dispense Refill  . amitriptyline (ELAVIL) 25 MG tablet Take 25 mg by mouth at bedtime.    . celecoxib (CELEBREX) 200 MG capsule Take 200 mg by mouth daily as needed.    Marland Kitchen losartan-hydrochlorothiazide (HYZAAR) 100-12.5 MG tablet TAKE ONE (1) TABLET EACH DAY 90 tablet 0   No current facility-administered medications for this visit.     Patient confirms/reports the following allergies:  No Known Allergies  No orders of the defined types were placed in this encounter.   AUTHORIZATION INFORMATION Primary Insurance: [employer] [place],  ID #: [id number] Pre-Cert / Josem Kaufmann required: no Pre-Cert / Auth #: Per Morey Hum[employer] Ref# [id number]   SCHEDULE INFORMATION: Procedure has been scheduled as follows:  Date: 05/07/2019, Time: 1:30 Location: [facility]/[name]  This Gastroenterology Pre-Precedure Review Form is being routed to the following  provider(s): Walden Field, NP

## 2019-02-22 NOTE — Addendum Note (Signed)
Addended by: Metro Kung on: 02/22/2019 11:04 AM   Modules accepted: Orders, SmartSet

## 2019-02-22 NOTE — Progress Notes (Signed)
Ok to schedule.

## 2019-02-26 ENCOUNTER — Ambulatory Visit (INDEPENDENT_AMBULATORY_CARE_PROVIDER_SITE_OTHER): Payer: PRIVATE HEALTH INSURANCE

## 2019-02-26 ENCOUNTER — Ambulatory Visit (INDEPENDENT_AMBULATORY_CARE_PROVIDER_SITE_OTHER): Payer: Self-pay

## 2019-02-26 ENCOUNTER — Other Ambulatory Visit: Payer: Self-pay

## 2019-02-26 ENCOUNTER — Ambulatory Visit: Payer: PRIVATE HEALTH INSURANCE | Admitting: Rheumatology

## 2019-02-26 ENCOUNTER — Encounter: Payer: Self-pay | Admitting: Rheumatology

## 2019-02-26 ENCOUNTER — Telehealth: Payer: Self-pay | Admitting: *Deleted

## 2019-02-26 VITALS — BP 145/102 | HR 76 | Resp 14 | Ht 64.5 in | Wt 229.0 lb

## 2019-02-26 DIAGNOSIS — M79642 Pain in left hand: Secondary | ICD-10-CM

## 2019-02-26 DIAGNOSIS — M79671 Pain in right foot: Secondary | ICD-10-CM

## 2019-02-26 DIAGNOSIS — M79641 Pain in right hand: Secondary | ICD-10-CM | POA: Diagnosis not present

## 2019-02-26 DIAGNOSIS — Z8659 Personal history of other mental and behavioral disorders: Secondary | ICD-10-CM

## 2019-02-26 DIAGNOSIS — M25511 Pain in right shoulder: Secondary | ICD-10-CM

## 2019-02-26 DIAGNOSIS — M25562 Pain in left knee: Secondary | ICD-10-CM | POA: Diagnosis not present

## 2019-02-26 DIAGNOSIS — M79672 Pain in left foot: Secondary | ICD-10-CM

## 2019-02-26 DIAGNOSIS — G8929 Other chronic pain: Secondary | ICD-10-CM

## 2019-02-26 DIAGNOSIS — M25512 Pain in left shoulder: Secondary | ICD-10-CM

## 2019-02-26 DIAGNOSIS — R768 Other specified abnormal immunological findings in serum: Secondary | ICD-10-CM | POA: Diagnosis not present

## 2019-02-26 DIAGNOSIS — R5383 Other fatigue: Secondary | ICD-10-CM

## 2019-02-26 DIAGNOSIS — I10 Essential (primary) hypertension: Secondary | ICD-10-CM

## 2019-02-26 DIAGNOSIS — Z87891 Personal history of nicotine dependence: Secondary | ICD-10-CM

## 2019-02-26 DIAGNOSIS — M25561 Pain in right knee: Secondary | ICD-10-CM

## 2019-02-26 DIAGNOSIS — Z8349 Family history of other endocrine, nutritional and metabolic diseases: Secondary | ICD-10-CM

## 2019-02-26 DIAGNOSIS — Z79899 Other long term (current) drug therapy: Secondary | ICD-10-CM

## 2019-02-26 DIAGNOSIS — Z800 Family history of malignant neoplasm of digestive organs: Secondary | ICD-10-CM

## 2019-02-26 NOTE — Telephone Encounter (Signed)
Called pt to let her know that no pre-cert was required for her colonoscopy as long as our provider was in network.  Advised pt to call to follow up to make sure we are in network.  Pt voiced understanding.

## 2019-03-05 LAB — PROTEIN ELECTROPHORESIS, SERUM, WITH REFLEX
ALPHA 2: 0.9 g/dL (ref 0.5–0.9)
Albumin ELP: 3.3 g/dL — ABNORMAL LOW (ref 3.8–4.8)
Alpha 1: 0.4 g/dL — ABNORMAL HIGH (ref 0.2–0.3)
Beta 2: 0.8 g/dL — ABNORMAL HIGH (ref 0.2–0.5)
Beta Globulin: 0.6 g/dL (ref 0.4–0.6)
Gamma Globulin: 1.8 g/dL — ABNORMAL HIGH (ref 0.8–1.7)
TOTAL PROTEIN: 7.9 g/dL (ref 6.1–8.1)

## 2019-03-05 LAB — RHEUMATOID FACTOR: Rheumatoid fact SerPl-aCnc: 152 IU/mL — ABNORMAL HIGH (ref <14)

## 2019-03-05 LAB — URINALYSIS, ROUTINE W REFLEX MICROSCOPIC
Bacteria, UA: NONE SEEN /HPF
Bilirubin Urine: NEGATIVE
Glucose, UA: NEGATIVE
Hgb urine dipstick: NEGATIVE
Hyaline Cast: NONE SEEN /LPF
NITRITE: NEGATIVE
Protein, ur: NEGATIVE
Specific Gravity, Urine: 1.025 (ref 1.001–1.03)

## 2019-03-05 LAB — QUANTIFERON-TB GOLD PLUS
Mitogen-NIL: 8.1 IU/mL
NIL: 0.02 IU/mL
QuantiFERON-TB Gold Plus: NEGATIVE
TB1-NIL: 0 IU/mL
TB2-NIL: 0 IU/mL

## 2019-03-05 LAB — IGG, IGA, IGM
IgG (Immunoglobin G), Serum: 1848 mg/dL — ABNORMAL HIGH (ref 600–1540)
IgM, Serum: 118 mg/dL (ref 50–300)
Immunoglobulin A: 967 mg/dL — ABNORMAL HIGH (ref 70–320)

## 2019-03-05 LAB — IFE INTERPRETATION: Immunofix Electr Int: NOT DETECTED

## 2019-03-05 LAB — GLUCOSE 6 PHOSPHATE DEHYDROGENASE: G-6PDH: 14.3 U/g{Hb} (ref 7.0–20.5)

## 2019-03-05 LAB — HEPATITIS B SURFACE ANTIGEN: Hepatitis B Surface Ag: NONREACTIVE

## 2019-03-05 LAB — HEPATITIS B CORE ANTIBODY, IGM: Hep B C IgM: NONREACTIVE

## 2019-03-05 LAB — CK: Total CK: 48 U/L (ref 29–143)

## 2019-03-05 LAB — 14-3-3 ETA PROTEIN: 14-3-3 eta Protein: 4.5 ng/mL — ABNORMAL HIGH (ref <0.2)

## 2019-03-05 NOTE — Progress Notes (Signed)
Labs are c/w RA. Plan tt at the fu visit. If earlier appt is available then you may schedule.

## 2019-03-06 ENCOUNTER — Telehealth (INDEPENDENT_AMBULATORY_CARE_PROVIDER_SITE_OTHER): Payer: PRIVATE HEALTH INSURANCE | Admitting: Rheumatology

## 2019-03-06 ENCOUNTER — Encounter: Payer: Self-pay | Admitting: Rheumatology

## 2019-03-06 ENCOUNTER — Telehealth: Payer: Self-pay | Admitting: Rheumatology

## 2019-03-06 DIAGNOSIS — M0579 Rheumatoid arthritis with rheumatoid factor of multiple sites without organ or systems involvement: Secondary | ICD-10-CM | POA: Diagnosis not present

## 2019-03-06 DIAGNOSIS — Z79899 Other long term (current) drug therapy: Secondary | ICD-10-CM

## 2019-03-06 DIAGNOSIS — M170 Bilateral primary osteoarthritis of knee: Secondary | ICD-10-CM | POA: Insufficient documentation

## 2019-03-06 DIAGNOSIS — I10 Essential (primary) hypertension: Secondary | ICD-10-CM

## 2019-03-06 DIAGNOSIS — Z87891 Personal history of nicotine dependence: Secondary | ICD-10-CM

## 2019-03-06 DIAGNOSIS — Z800 Family history of malignant neoplasm of digestive organs: Secondary | ICD-10-CM

## 2019-03-06 MED ORDER — HYDROXYCHLOROQUINE SULFATE 200 MG PO TABS: 200.0000 mg | ORAL_TABLET | Freq: Two times a day (BID) | ORAL | 0 refills | Status: DC

## 2019-03-06 NOTE — Progress Notes (Signed)
Virtual Visit via Telephone Note  I connected with Jean Howell on 03/06/19 at  1:00 PM EDT by telephone and verified that I am speaking with the correct person using two identifiers.   I discussed the limitations, risks, security and privacy concerns of performing an evaluation and management service by telephone and the availability of in person appointments. I also discussed with the patient that there may be a patient responsible charge related to this service. The patient expressed understanding and agreed to proceed.  CC: Pain in multiple joints  History of Present Illness: Patient is a 63 year old female with a past medical history of rheumatoid arthritis and osteoarthritis. She takes celebrex 200 mg 1 tablet by mouth daily for pain relief.  She has pain in both hands, both wrist joints, right knee joint, and left ankle joint.  She has been using a right knee joint brace which has been helping relieve some of her discomfort.  She has difficulty walking due to the pain in both knee joints.  She has joint swelling in both hands, right knee, and left ankle joint.    Review of Systems  Constitutional: Positive for malaise/fatigue. Negative for fever.  Eyes: Negative for pain and redness.  Respiratory: Negative for cough, shortness of breath and wheezing.   Cardiovascular: Negative for chest pain and palpitations.  Gastrointestinal: Negative for blood in stool, constipation and diarrhea.  Genitourinary: Negative for dysuria.  Musculoskeletal: Positive for joint pain. Negative for back pain, myalgias and neck pain.  Skin: Negative for rash.  Neurological: Negative for dizziness and headaches.  Psychiatric/Behavioral: Negative for depression. The patient is not nervous/anxious.    Observations/Objective: Physical Exam  Constitutional: She is oriented to person, place, and time.  Neurological: She is alert and oriented to person, place, and time.  Psychiatric: Mood, memory, affect and  judgment normal.    Patient reports  stiffness all day.   Patient reports nocturnal pain.  Difficulty dressing/grooming: Denies Difficulty climbing stairs: Reports Difficulty getting out of chair: Reports Difficulty using hands for taps, buttons, cutlery, and/or writing: Reports  February 26, 2019 UA negative, IFE negative, G6PD normal, hepatitis B-, IgG elevated, TB Gold negative, CK 48, RF 152, 14 3 3  eta 4.5, 01/08/19: RF 100.6, sed rate 1, CRP 47, Scl-70 1.7, ANA+, dsDNA-, RNP-, Ro-, La-, smith-, Hep C ab-, HIV-  Assessment and Plan: Rheumatoid arthritis involving multiple joints with positive rheumatoid factor: Positive RF, +14-3-3 eta, Positive ANA, inflammatory arthritis:  The lab work obtained on 02/26/19 was reviewed with the patient and all questions were addressed.  We discussed the disease process of rheumatoid arthritis and the role of DMARDs.  She continues to have pain in multiple joints including bilateral hands, bilateral wrist joints, right knee joint, and left ankle joint.  She report swelling in both hands, right knee joint, and left ankle joint.  Her joint stiffness has been lasting all day.  We discussed the indications, contraindications, and potential side effects of plaquenil.  All questions were addressed.  She will schedule a baseline PLQ eye exam and return for lab work in 1 month, 3 months, and then every 5 months.  A prescription for Plaquenil 200 mg 1 tablet by mouth twice daily will be sent to the pharmacy.  She was advised to notify us if she cannot tolerate taking PLQ or develops any SEs.  She will also notify us if she has increased joint pain or joint swelling. She will follow up in 3 months.  High risk medication use: Discussed starting on plaquenil for treatment of rheumatoid arthritis.  We reviewed the indications, contraindications, and potential side effects of PLQ.  We will send her a consent form to fill out and send back to [place].  We advised her to schedule a  baseline PLQ eye exam.  We will send an eye exam form to her to take with her to her appointment. She will return for lab work in 1 month, 3 months, and then every 5 months.  Standing orders were placed today.  Patient was counseled on the purpose, proper use, and adverse effects of hydroxychloroquine including nausea/diarrhea, skin rash, headaches, and sun sensitivity.  Discussed importance of annual eye exams while on hydroxychloroquine to monitor to ocular toxicity and discussed importance of frequent laboratory monitoring.  Provided patient with eye exam form for baseline ophthalmologic exam.  Provided patient with educational materials on hydroxychloroquine and answered all questions.  Patient consented to hydroxychloroquine.  Will upload consent in the media tab.    Dose will be Plaquenil 200 mg twice daily.  Prescription pending lab results.  Primary osteoarthritis of both knees: right knee severe OA, Left knee moderate OA: She has chronic pain in both knee joints.  She reports right knee joint swelling intermittently.  She has been wearing a right knee joint brace which has been providing some pain relief.    Follow Up Instructions: She will follow up in 3 months.  We will send in a prescription for PLQ 200 mg 1 tablet by mouth twice daily.   She will return for lab work in 1 month, then 3 months, then every 5 months.   Information about PLQ and the consent form will be mailed to the patient.  She will send the completed consent form back to our office.  She voiced understanding of the above plan.     I discussed the assessment and treatment plan with the patient. The patient was provided an opportunity to ask questions and all were answered. The patient agreed with the plan and demonstrated an understanding of the instructions.   The patient was advised to call back or seek an in-person evaluation if the symptoms worsen or if the condition fails to improve as anticipated.  I provided 25  minutes of non-face-to-face time during this encounter.  Bo Merino, MD  Scribed by- Ofilia Neas, PA-C

## 2019-03-06 NOTE — Patient Instructions (Addendum)
Standing Labs We placed an order today for your standing lab work.    Please come back and get your standing labs in 1 month after starting plaquenil and then every 3 months.  We have open lab Monday through Friday from 8:30-11:30 AM and 1:30-4:00 PM  at the office of Dr. Bo Merino.   You may experience shorter wait times on Monday and Friday afternoons. The office is located at [address] [place], [place], [place] [address] No appointment is necessary.   Labs are drawn by [facility].  You may receive a bill from [facility] for your lab work.  If you wish to have your labs drawn at another location, please call the office 24 hours in advance to send orders.  If you have any questions regarding directions or hours of operation,  please call [phone].   Just as a reminder please drink plenty of water prior to coming for your lab work. Thanks!   Hydroxychloroquine tablets What is this medicine? HYDROXYCHLOROQUINE (hye drox ee KLOR oh kwin) is used to treat rheumatoid arthritis and systemic lupus erythematosus. It is also used to treat malaria. This medicine may be used for other purposes; ask your health care provider or pharmacist if you have questions. COMMON BRAND NAME(S): Plaquenil, Quineprox What should I tell my health care provider before I take this medicine? They need to know if you have any of these conditions: -diabetes -eye disease, vision problems -G6PD deficiency -history of blood diseases -history of irregular heartbeat -if you often drink alcohol -kidney disease -liver disease -porphyria -psoriasis -seizures -an unusual or allergic reaction to chloroquine, hydroxychloroquine, other medicines, foods, dyes, or preservatives -pregnant or trying to get pregnant -breast-feeding How should I use this medicine? Take this medicine by mouth with a glass of water. Follow the directions on the prescription label. Avoid taking antacids within 4 hours of taking  this medicine. It is best to separate these medicines by at least 4 hours. Do not cut, crush or chew this medicine. You can take it with or without food. If it upsets your stomach, take it with food. Take your medicine at regular intervals. Do not take your medicine more often than directed. Take all of your medicine as directed even if you think you are better. Do not skip doses or stop your medicine early. Talk to your pediatrician regarding the use of this medicine in children. While this drug may be prescribed for selected conditions, precautions do apply. Overdosage: If you think you have taken too much of this medicine contact a poison control center or emergency room at once. NOTE: This medicine is only for you. Do not share this medicine with others. What if I miss a dose? If you miss a dose, take it as soon as you can. If it is almost time for your next dose, take only that dose. Do not take double or extra doses. What may interact with this medicine? Do not take this medicine with any of the following medications: -cisapride -dofetilide -dronedarone -live virus vaccines -penicillamine -pimozide -thioridazine -ziprasidone This medicine may also interact with the following medications: -ampicillin -antacids -cimetidine -cyclosporine -digoxin -medicines for diabetes, like insulin, glipizide, glyburide -medicines for seizures like carbamazepine, phenobarbital, phenytoin -mefloquine -methotrexate -other medicines that prolong the QT interval (cause an abnormal heart rhythm) -praziquantel This list may not describe all possible interactions. Give your health care provider a list of all the medicines, herbs, non-prescription drugs, or dietary supplements you use. Also tell them if you  smoke, drink alcohol, or use illegal drugs. Some items may interact with your medicine. What should I watch for while using this medicine? Tell your doctor or healthcare professional if your symptoms do  not start to get better or if they get worse. Avoid taking antacids within 4 hours of taking this medicine. It is best to separate these medicines by at least 4 hours. Tell your doctor or health care professional right away if you have any change in your eyesight. Your vision and blood may be tested before and during use of this medicine. This medicine can make you more sensitive to the sun. Keep out of the sun. If you cannot avoid being in the sun, wear protective clothing and use sunscreen. Do not use sun lamps or tanning beds/booths. What side effects may I notice from receiving this medicine? Side effects that you should report to your doctor or health care professional as soon as possible: -allergic reactions like skin rash, itching or hives, swelling of the face, lips, or tongue -changes in vision -decreased hearing or ringing of the ears -redness, blistering, peeling or loosening of the skin, including inside the mouth -seizures -sensitivity to light -signs and symptoms of a dangerous change in heartbeat or heart rhythm like chest pain; dizziness; fast or irregular heartbeat; palpitations; feeling faint or lightheaded, falls; breathing problems -signs and symptoms of liver injury like dark yellow or brown urine; general ill feeling or flu-like symptoms; light-colored stools; loss of appetite; nausea; right upper belly pain; unusually weak or tired; yellowing of the eyes or skin -signs and symptoms of low blood sugar such as feeling anxious; confusion; dizziness; increased hunger; unusually weak or tired; sweating; shakiness; cold; irritable; headache; blurred vision; fast heartbeat; loss of consciousness -uncontrollable head, mouth, neck, arm, or leg movements Side effects that usually do not require medical attention (report to your doctor or health care professional if they continue or are bothersome): -anxious -diarrhea -dizziness -hair loss -headache -irritable -loss of  appetite -nausea, vomiting -stomach pain This list may not describe all possible side effects. Call your doctor for medical advice about side effects. You may report side effects to FDA at 1-800-FDA-1088. Where should I keep my medicine? Keep out of the reach of children. In children, this medicine can cause overdose with small doses. Store at room temperature between 15 and 30 degrees C (59 and 86 degrees F). Protect from moisture and light. Throw away any unused medicine after the expiration date. NOTE: This sheet is a summary. It may not cover all possible information. If you have questions about this medicine, talk to your doctor, pharmacist, or health care provider.  2019 Elsevier/Gold Standard (2016-07-07 14:16:15)

## 2019-03-06 NOTE — Telephone Encounter (Signed)
Patient just had a virtual appt with doctor. Doctor mentioned calling in a new medication for her to her pharmacy. [facility] in [place] [facility] not have the medication doctor sent in, and cannot get it. Patient ? calling in a different medication. Please call to advise.

## 2019-03-06 NOTE — Telephone Encounter (Signed)
Patient advised the prescription is PLQ. Patient advised to try one of the chain pharmacies, as they may have the prescription. Prescription sent to the [facility] in [place]. Patient advised.

## 2019-03-26 ENCOUNTER — Ambulatory Visit: Payer: PRIVATE HEALTH INSURANCE | Admitting: Rheumatology

## 2019-04-10 ENCOUNTER — Other Ambulatory Visit: Payer: Self-pay | Admitting: Family Medicine

## 2019-04-10 DIAGNOSIS — I10 Essential (primary) hypertension: Secondary | ICD-10-CM

## 2019-04-26 NOTE — Progress Notes (Signed)
Called pt to re-schedule her procedure on 05/07/2019 due to COVID 19 restrictions.  Pt aware that we will mail out new prep instructions.  Endo notified.

## 2019-05-11 ENCOUNTER — Other Ambulatory Visit: Payer: Self-pay

## 2019-05-14 ENCOUNTER — Other Ambulatory Visit: Payer: Self-pay

## 2019-05-14 ENCOUNTER — Ambulatory Visit (INDEPENDENT_AMBULATORY_CARE_PROVIDER_SITE_OTHER): Payer: PRIVATE HEALTH INSURANCE | Admitting: Family Medicine

## 2019-05-14 VITALS — BP 133/87 | HR 85 | Temp 97.9°F | Ht 64.5 in | Wt 238.0 lb

## 2019-05-14 DIAGNOSIS — M0579 Rheumatoid arthritis with rheumatoid factor of multiple sites without organ or systems involvement: Secondary | ICD-10-CM

## 2019-05-14 DIAGNOSIS — I10 Essential (primary) hypertension: Secondary | ICD-10-CM

## 2019-05-14 MED ORDER — LOSARTAN POTASSIUM-HCTZ 100-12.5 MG PO TABS: 1.0000 | ORAL_TABLET | Freq: Every day | ORAL | 3 refills | Status: DC

## 2019-05-14 NOTE — Patient Instructions (Signed)
Blood pressure looks great.  I have sent your medication to the pharmacy.  You had labs performed today.  You will be contacted with the results of the labs once they are available, usually in the next 3 business days for routine lab work.  If you had a pap smear or biopsy performed, expect to be contacted in about 7-10 days.  I will copy Dr Estanislado Pandy on your labs.

## 2019-05-14 NOTE — Progress Notes (Signed)
Subjective: CC: Arthritis, HTN HPI: Jean Howell is a 63 y.o. female presenting to clinic today for:  1. HTN Patient reports compliance with her Hyzaar.  No chest pain, shortness of breath, dizziness or lower extremity edema.  She overall feels good.  She does not exercise regularly secondary to chronic pain in her left ankle and right knee.  She is under the care of rheumatology for rheumatoid arthritis and is treated with Plaquenil.  She is compliant with his medication as well.  She had a virtual visit recently and no changes were made but she was encouraged to obtain labs.  She would like to get these done at our office today.  Additionally, since our last visit she has only been using the Celebrex 200 mg once daily as we discussed.  She does find this to be somewhat helpful but again continues to have some pain that she does not want to see the orthopedist for because she does not want any shots or surgery.    Past Medical History:  Diagnosis Date  . Arthritis   . Depression   . Hypertension    Past Surgical History:  Procedure Laterality Date  . BREAST SURGERY     nodules removed   Social History   Socioeconomic History  . Marital status: Widowed    Spouse name: Not on file  . Number of children: 1  . Years of education: Not on file  . Highest education level: Not on file  Occupational History  . Not on file  Social Needs  . Financial resource strain: Not on file  . Food insecurity:    Worry: Not on file    Inability: Not on file  . Transportation needs:    Medical: Not on file    Non-medical: Not on file  Tobacco Use  . Smoking status: Former Smoker    Packs/day: 1.00    Years: 15.00    Pack years: 15.00    Types: Cigarettes    Last attempt to quit: 2000    Years since quitting: 20.4  . Smokeless tobacco: Never Used  Substance and Sexual Activity  . Alcohol use: Not Currently  . Drug use: Never  . Sexual activity: Not Currently    Comment: widowed   Lifestyle  . Physical activity:    Days per week: Not on file    Minutes per session: Not on file  . Stress: Not on file  Relationships  . Social connections:    Talks on phone: Not on file    Gets together: Not on file    Attends religious service: Not on file    Active member of club or organization: Not on file    Attends meetings of clubs or organizations: Not on file    Relationship status: Not on file  . Intimate partner violence:    Fear of current or ex partner: Not on file    Emotionally abused: Not on file    Physically abused: Not on file    Forced sexual activity: Not on file  Other Topics Concern  . Not on file  Social History Narrative   Patient is a widow that resides independently in [place].  She has 1 son, who resides in [place].   Current Meds  Medication Sig  . amitriptyline (ELAVIL) 25 MG tablet Take 25 mg by mouth at bedtime.  . celecoxib (CELEBREX) 200 MG capsule Take 200 mg by mouth daily as needed.  . hydroxychloroquine (PLAQUENIL) 200  MG tablet Take 1 tablet (200 mg total) by mouth 2 (two) times daily.  Marland Kitchen losartan-hydrochlorothiazide (HYZAAR) 100-12.5 MG tablet TAKE ONE (1) TABLET EACH DAY   Family History  Problem Relation Age of Onset  . Cancer Father   . Colon cancer Father   . Thyroid disease Mother   . CVA Mother 56  . Thyroid disease Sister   . Healthy Son   . Congestive Heart Failure Sister   . Hypertension Sister   . Diabetes Sister   . Hypertension Sister   . Diabetes Sister   . Hypertension Sister   . Hypertension Sister   . Cancer Brother        kidney    No Known Allergies   Health Maintenance: Colonscopy, declines vaccines. Mammogram and pap also due.  ROS: Per HPI  Objective: Office vital signs reviewed. BP 133/87   Pulse 85   Temp 97.9 F (36.6 C) (Oral)   Ht 5' 4.5" (1.638 m)   Wt 238 lb (108 kg)   BMI 40.22 kg/m   Physical Examination:  General: Awake, alert, well nourished, No acute distress  HEENT: Normal, sclera white, MMM Cardio: regular rate and rhythm, S1S2 heard, no murmurs appreciated Pulm: clear to auscultation bilaterally, no wheezes, rhonchi or rales; normal work of breathing on room air Extremities: warm, well perfused, No edema, cyanosis or clubbing; +2 pulses bilaterally MSK: Left ankle with some joint swelling.  Right knee with no significant swelling.  She is able to ambulate independently.  Assessment/ Plan: 63 y.o. female   1. Essential hypertension Controlled.  Continue current regimen.  Refills have been sent to her pharmacy.  Check renal function. - losartan-hydrochlorothiazide (HYZAAR) 100-12.5 MG tablet; Take 1 tablet by mouth daily.  Dispense: 90 tablet; Refill: 3 - CMP14+EGFR  2. Rheumatoid arthritis involving multiple sites with positive rheumatoid factor ([place]) Stable and followed by rheumatology.  I will obtain the CMP and CBC that were recommended.  Will CC results to rheumatologist once available. - [place], [place] [phone]

## 2019-05-15 LAB — CMP14+EGFR
ALT: 13 IU/L (ref 0–32)
AST: 14 IU/L (ref 0–40)
Albumin/Globulin Ratio: 0.9 — ABNORMAL LOW (ref 1.2–2.2)
Albumin: 3.5 g/dL — ABNORMAL LOW (ref 3.8–4.8)
Alkaline Phosphatase: 97 IU/L (ref 39–117)
BUN/Creatinine Ratio: 15 (ref 12–28)
BUN: 18 mg/dL (ref 8–27)
Bilirubin Total: <0.2 mg/dL (ref 0.0–1.2)
CO2: 26 mmol/L (ref 20–29)
Calcium: 8.8 mg/dL (ref 8.7–10.3)
Chloride: 99 mmol/L (ref 96–106)
Creatinine, Ser: 1.21 mg/dL — ABNORMAL HIGH (ref 0.57–1.00)
GFR calc Af Amer: 55 mL/min/{1.73_m2} — ABNORMAL LOW (ref >59)
GFR calc non Af Amer: 47 mL/min/{1.73_m2} — ABNORMAL LOW (ref >59)
Globulin, Total: 4 g/dL (ref 1.5–4.5)
Glucose: 84 mg/dL (ref 65–99)
Potassium: 4.3 mmol/L (ref 3.5–5.2)
Sodium: 136 mmol/L (ref 134–144)
Total Protein: 7.5 g/dL (ref 6.0–8.5)

## 2019-05-15 LAB — CBC
Hematocrit: 34.1 % (ref 34.0–46.6)
Hemoglobin: 11.4 g/dL (ref 11.1–15.9)
MCH: 26.3 pg — ABNORMAL LOW (ref 26.6–33.0)
MCHC: 33.4 g/dL (ref 31.5–35.7)
MCV: 79 fL (ref 79–97)
Platelets: 387 10*3/uL (ref 150–450)
RBC: 4.33 x10E6/uL (ref 3.77–5.28)
RDW: 15.2 % (ref 11.7–15.4)
WBC: 6.2 10*3/uL (ref 3.4–10.8)

## 2019-05-28 NOTE — Progress Notes (Signed)
Office Visit Note  Patient: Jean Howell             Date of Birth: [birth year]           MRN: [id number]             PCP: [name], DO Referring: [name], DO Visit Date: 06/11/2019 Occupation: @GUAROCC @  Subjective:  Pain in multiple joints.      History of Present Illness: Jean Howell is a 63 y.o. female with seropositive rheumatoid arthritis and osteoarthritis.  She states she has not noticed great improvement on Plaquenil so far.  She still has intermittent swelling in her hands, her knee joints and her ankles.  Although she is not interested in more aggressive therapy at this point.  She states she gets a stiff after prolonged sitting.  Patient has been having difficulty doing routine activities.  She has been limping and having difficulty walking.  She states her right knee joint is so swollen that she has to wear a brace on it.  Activities of Daily Living:  Patient reports morning stiffness for several hours.   Patient Reports nocturnal pain.  Difficulty dressing/grooming: Reports Difficulty climbing stairs: Reports Difficulty getting out of chair: Reports Difficulty using hands for taps, buttons, cutlery, and/or writing: Reports  Review of Systems  Constitutional: Positive for fatigue. Negative for night sweats, weight gain and weight loss.  HENT: Negative for mouth sores, trouble swallowing, trouble swallowing, mouth dryness and nose dryness.   Eyes: Negative for pain, redness, itching, visual disturbance and dryness.  Respiratory: Negative for cough, shortness of breath and difficulty breathing.   Cardiovascular: Negative for chest pain, palpitations, hypertension, irregular heartbeat and swelling in legs/feet.  Gastrointestinal: Negative for abdominal pain, blood in stool, constipation and diarrhea.  Endocrine: Negative for increased urination.  Genitourinary: Negative for painful urination, pelvic pain and vaginal dryness.  Musculoskeletal:  Positive for arthralgias, joint pain, joint swelling and morning stiffness. Negative for myalgias, muscle weakness, muscle tenderness and myalgias.  Skin: Negative for color change, rash, hair loss, redness, skin tightness, ulcers and sensitivity to sunlight.  Allergic/Immunologic: Negative for susceptible to infections.  Neurological: Positive for weakness. Negative for dizziness, light-headedness, headaches, memory loss and night sweats.  Hematological: Negative for bruising/bleeding tendency and swollen glands.  Psychiatric/Behavioral: Positive for sleep disturbance. Negative for depressed mood and confusion. The patient is not nervous/anxious.     PMFS History:  Patient Active Problem List   Diagnosis Date Noted  . Rheumatoid arthritis involving multiple sites with positive rheumatoid factor ([place]) 03/06/2019  . Primary osteoarthritis of both knees 03/06/2019  . Former smoker 03/06/2019  . ANA positive 02/12/2019  . Family history of thyroid disease 01/08/2019  . Morbid obesity ([facility] [facility]) 01/08/2019  . Essential hypertension 01/08/2019  . Polyarthralgia 01/08/2019  . Family history of colon cancer 01/08/2019    Past Medical History:  Diagnosis Date  . Arthritis   . Depression   . Hypertension     Family History  Problem Relation Age of Onset  . Cancer Father   . Colon cancer Father   . Thyroid disease Mother   . CVA Mother 64  . Thyroid disease Sister   . Healthy Son   . Congestive Heart Failure Sister   . Hypertension Sister   . Diabetes Sister   . Hypertension Sister   . Diabetes Sister   . Hypertension Sister   . Hypertension Sister   . Cancer Brother  kidney    Past Surgical History:  Procedure Laterality Date  . BREAST SURGERY     nodules removed   Social History   Social History Narrative   Patient is a widow that resides independently in [place].  She has 1 son, who resides in [place].    There is no immunization history on file for this  patient.   Objective: Vital Signs: BP 133/85 (BP Location: Left Arm, Patient Position: Sitting, Cuff Size: Normal)   Pulse 73   Resp 14   Ht 5\' 4"  (1.626 m)   Wt 238 lb (108 kg)   BMI 40.85 kg/m    Physical Exam Vitals signs and nursing note reviewed.  Constitutional:      Appearance: She is well-developed.  HENT:     Head: Normocephalic and atraumatic.  Eyes:     Conjunctiva/sclera: Conjunctivae normal.  Neck:     Musculoskeletal: Normal range of motion.  Cardiovascular:     Rate and Rhythm: Normal rate and regular rhythm.     Heart sounds: Normal heart sounds.  Pulmonary:     Effort: Pulmonary effort is normal.     Breath sounds: Normal breath sounds.  Abdominal:     General: Bowel sounds are normal.     Palpations: Abdomen is soft.  Lymphadenopathy:     Cervical: No cervical adenopathy.  Skin:    General: Skin is warm and dry.     Capillary Refill: Capillary refill takes less than 2 seconds.  Neurological:     Mental Status: She is alert and oriented to person, place, and time.  Psychiatric:        Behavior: Behavior normal.      Musculoskeletal Exam: C-spine was in good range of motion.  Lumbar spine was difficult to assess.  Shoulder joints and elbow joints with good range of motion.  She has synovitis over left second PIP joint.  She has warmth and swelling over her right knee joint.  She has warmth and swelling over her ankle joint.  She was limping during the exam.  CDAI Exam: CDAI Score: 5.4  Patient Global: 7 mm; Provider Global: 7 mm Swollen: 3 ; Tender: 3  Joint Exam      Right  Left  PIP 2     Swollen Tender  Knee  Swollen Tender     Ankle     Swollen Tender     Investigation: No additional findings.  Imaging: No results found.  Recent Labs: Lab Results  Component Value Date   WBC 6.2 05/14/2019   HGB 11.4 05/14/2019   PLT 387 05/14/2019   NA 136 05/14/2019   K 4.3 05/14/2019   CL 99 05/14/2019   CO2 26 05/14/2019   GLUCOSE 84  05/14/2019   BUN 18 05/14/2019   CREATININE 1.21 (H) 05/14/2019   BILITOT <0.2 05/14/2019   ALKPHOS 97 05/14/2019   AST 14 05/14/2019   ALT 13 05/14/2019   PROT 7.5 05/14/2019   ALBUMIN 3.5 (L) 05/14/2019   CALCIUM 8.8 05/14/2019   GFRAA 55 (L) 05/14/2019   QFTBGOLDPLUS NEGATIVE 02/26/2019    Speciality Comments: No specialty comments available.  Procedures:  No procedures performed Allergies: Patient has no known allergies.   Assessment / Plan:     Visit Diagnoses: Rheumatoid arthritis involving multiple sites with positive rheumatoid factor (H[facility]) - Positive RF, +14-3-3 eta, Positive ANA, erosive inflammatory arthritis -patient has severe disease with pain and inflammation in multiple joints.  She has an  adequate response to Plaquenil.  We had detailed discussion regarding different treatment options and their side effects.  She is quite hesitant about the side effects of the medications.  After reviewing indications side effects contraindications she was willing to proceed with methotrexate.  A handout was given and consent was taken.  She will get a baseline chest x-ray today.  After that we will start her on methotrexate 6 tablets p.o. weekly along with folic acid 1 mg p.o. daily.  We will check labs in 2 weeks, 2 months and then every 3 months.  Will prefer to keep her on a lower dose of methotrexate due to low GFR.  Leflunomide could be the next choice if she has changes in her GFR on methotrexate.  High risk medication use -Plaquenil 200 mg 1 tablet twice daily started in April 2020.  No baseline Plaquenil eye exam on file.  Most recent CBC/CMP within normal limits except for elevated creatinine and decreased GFR but stable on 05/14/2019. Due for CBC/CMP in August and will monitor every 5 months.  Standing orders are in place. Recommend annual influenza, Pneumovax 23, Prevnar 13, and Shingrix as indicated for immunosuppressant therapy.   Primary osteoarthritis and rheumatoid arthritis  of both knees - right knee severe OA, Left knee moderate OA -she has warmth and swelling in her right knee joint.  She also has severe osteoarthritis in this knee joint.  She is not ready for total knee replacement yet.  She may benefit from more aggressive therapy.  Swelling of ankle joint, left - Plan: More aggressive therapy for rheumatoid arthritis was discussed.  ANA positive   Other fatigue -likely due to chronic illness.  Essential hypertension-blood pressure is fairly well controlled.  History of depression  Family history of thyroid disease  Family history of colon cancer Orders: Orders Placed This Encounter  Procedures  . DG Chest 2 View   Meds ordered this encounter  Medications  . hydroxychloroquine (PLAQUENIL) 200 MG tablet    Sig: Take 1 tablet (200 mg total) by mouth 2 (two) times daily.    Dispense:  180 tablet    Refill:  0    Rheumatoid arthritis    Face-to-face time spent with patient was 35 minutes. Greater than 50% of time was spent in counseling and coordination of care.  Follow-Up Instructions: Return in about 5 months (around 11/11/2019) for Rheumatoid arthritis, Osteoarthritis.   Bo Merino, MD  Note - This record has been created using Editor, commissioning.  Chart creation errors have been sought, but may not always  have been located. Such creation errors do not reflect on  the standard of medical care.

## 2019-06-05 ENCOUNTER — Telehealth: Payer: Self-pay | Admitting: *Deleted

## 2019-06-05 NOTE — Telephone Encounter (Signed)
Pt is scheduled for her COVID 19 screening on 06/21/2019.  Pt is aware to remain in quarantine once testing is done.  Pt voiced understanding.

## 2019-06-11 ENCOUNTER — Ambulatory Visit (HOSPITAL_COMMUNITY)
Admission: RE | Admit: 2019-06-11 | Discharge: 2019-06-11 | Disposition: A | Discharge status: Home / Self Care | Payer: PRIVATE HEALTH INSURANCE | Source: Ambulatory Visit | Attending: Rheumatology | Admitting: Rheumatology

## 2019-06-11 ENCOUNTER — Ambulatory Visit (INDEPENDENT_AMBULATORY_CARE_PROVIDER_SITE_OTHER): Payer: PRIVATE HEALTH INSURANCE | Admitting: Rheumatology

## 2019-06-11 ENCOUNTER — Encounter: Payer: Self-pay | Admitting: Rheumatology

## 2019-06-11 ENCOUNTER — Telehealth: Payer: Self-pay | Admitting: Pharmacist

## 2019-06-11 ENCOUNTER — Other Ambulatory Visit: Payer: Self-pay

## 2019-06-11 VITALS — BP 133/85 | HR 73 | Resp 14 | Ht 64.0 in | Wt 238.0 lb

## 2019-06-11 DIAGNOSIS — Z9225 Personal history of immunosupression therapy: Secondary | ICD-10-CM | POA: Insufficient documentation

## 2019-06-11 DIAGNOSIS — M170 Bilateral primary osteoarthritis of knee: Secondary | ICD-10-CM | POA: Diagnosis not present

## 2019-06-11 DIAGNOSIS — M0579 Rheumatoid arthritis with rheumatoid factor of multiple sites without organ or systems involvement: Secondary | ICD-10-CM

## 2019-06-11 DIAGNOSIS — M25472 Effusion, left ankle: Secondary | ICD-10-CM

## 2019-06-11 DIAGNOSIS — R768 Other specified abnormal immunological findings in serum: Secondary | ICD-10-CM

## 2019-06-11 DIAGNOSIS — Z79899 Other long term (current) drug therapy: Secondary | ICD-10-CM | POA: Diagnosis not present

## 2019-06-11 DIAGNOSIS — R5383 Other fatigue: Secondary | ICD-10-CM

## 2019-06-11 MED ORDER — HYDROXYCHLOROQUINE SULFATE 200 MG PO TABS: 200.0000 mg | ORAL_TABLET | Freq: Two times a day (BID) | ORAL | 0 refills | Status: DC

## 2019-06-11 NOTE — Telephone Encounter (Signed)
Dose of methotrexate will be 6 tablets weekly along with folic acid 1mg  2 tablets daily. Prescription pending chest x-ray.

## 2019-06-11 NOTE — Progress Notes (Signed)
Pharmacy Note  Subjective: Patient presents today to the [facility] to see Dr. Estanislado Pandy.  Patient seen by the pharmacist for counseling on methotrexate for rheumatoid arthritis. Prior therapy includes:Plaquenil with inadequate response. She is hesitant to try any other medications and is fearful of any side effects.  Objective: CBC    Component Value Date/Time   WBC 6.2 05/14/2019 1606   RBC 4.33 05/14/2019 1606   HGB 11.4 05/14/2019 1606   HCT 34.1 05/14/2019 1606   PLT 387 05/14/2019 1606   MCV 79 05/14/2019 1606   MCH 26.3 (L) 05/14/2019 1606   MCHC 33.4 05/14/2019 1606   RDW 15.2 05/14/2019 1606    CMP     Component Value Date/Time   NA 136 05/14/2019 1606   K 4.3 05/14/2019 1606   CL 99 05/14/2019 1606   CO2 26 05/14/2019 1606   GLUCOSE 84 05/14/2019 1606   BUN 18 05/14/2019 1606   CREATININE 1.21 (H) 05/14/2019 1606   CALCIUM 8.8 05/14/2019 1606   PROT 7.5 05/14/2019 1606   ALBUMIN 3.5 (L) 05/14/2019 1606   AST 14 05/14/2019 1606   ALT 13 05/14/2019 1606   ALKPHOS 97 05/14/2019 1606   BILITOT <0.2 05/14/2019 1606   GFRNONAA 47 (L) 05/14/2019 1606   GFRAA 55 (L) 05/14/2019 1606    Baseline Immunosuppressant Therapy Labs TB GOLD Quantiferon TB Gold Latest Ref Rng & Units 02/26/2019  Quantiferon TB Gold Plus NEGATIVE NEGATIVE   Hepatitis Panel Hepatitis Latest Ref Rng & Units 02/26/2019  Hep B Surface Ag NON-REACTI NON-REACTIVE  Hep B IgM NON-REACTI NON-REACTIVE   HIV Lab Results  Component Value Date   HIV Non Reactive 01/08/2019   Immunoglobulins Immunoglobulin Electrophoresis Latest Ref Rng & Units 02/26/2019  IgA  70 - 320 mg/dL 967(H)  IgG 600 - 1,540 mg/dL 1,848(H)  IgM 50 - 300 mg/dL 118   SPEP Serum Protein Electrophoresis Latest Ref Rng & Units 05/14/2019  Total Protein 6.0 - 8.5 g/dL 7.5  Albumin 3.8 - 4.8 g/dL -  Alpha-1 0.2 - 0.3 g/dL -  Alpha-2 0.5 - 0.9 g/dL -  Beta Globulin 0.4 - 0.6 g/dL -  Beta 2 0.2 - 0.5 g/dL -   Gamma Globulin 0.8 - 1.7 g/dL -   G6PD Lab Results  Component Value Date   G6PDH 14.3 02/26/2019   TPMT No results found for: TPMT   Chest-xray:  Pending 06/11/2019  Contraception: post-menopausal  Alcohol use: N/A  Assessment/Plan:   Extensive discussion about rheumatoid arthritis disease process and long term complications if left untreated.  Compared treatment options of methotrexate and Arava.  She was leaning more towards methotrexate.  Patient was counseled on the purpose, proper use, and adverse effects of methotrexate including nausea, infection, and signs and symptoms of pneumonitis. Discussed that there is the possibility of an increased risk of malignancy, specifically lymphomas, but it is not well understood if this increased risk is due to the medication or the disease state.  Instructed patient that medication should be held for infection and prior to surgery.  Advised patient to avoid live vaccines. Recommend annual influenza, Pneumovax 23, Prevnar 13, and Shingrix as indicated.   Reviewed instructions with patient to take methotrexate weekly along with folic acid daily.  Discussed the importance of frequent monitoring of kidney and liver function and blood counts, and provided patient with standing lab instructions.  Counseled patient to avoid NSAIDs and alcohol while on methotrexate.  Provided patient with educational materials on methotrexate  and answered all questions.   Patient voiced understanding.  Patient consented to methotrexate use.  Will upload into chart.    Dose of methotrexate will be 6 tablets weekly along with folic acid 1mg  2 tablets daily. Prescription pending chest x-ray.  All questions encouraged and answered.  Instructed patient to call with any further questions or concerns.  Mariella Saa, PharmD, [facility] Clinical Pharmacist  06/11/2019 11:18 AM

## 2019-06-11 NOTE — Patient Instructions (Signed)
Standing Labs We placed an order today for your standing lab work.    Please come back and get your standing labs in 2 weeks, 4 weeks, 8 weeks, then every 3 months.  We have open lab daily Monday through Thursday from 8:30-12:30 PM and 1:30-4:30 PM and Friday from 8:30-12:30 PM and 1:30 -4:00 PM at the office of Dr. Bo Merino.   You may experience shorter wait times on Monday and Friday afternoons. The office is located at [address] [place], [place], [place] [address] No appointment is necessary.   Labs are drawn by [facility].  You may receive a bill from [facility] for your lab work.  If you wish to have your labs drawn at another location, please call the office 24 hours in advance to send orders.  If you have any questions regarding directions or hours of operation,  please call [phone].   Just as a reminder please drink plenty of water prior to coming for your lab work. Thanks!  Marland KitchenMethotrexate tablets What is this medicine? METHOTREXATE (METH oh TREX ate) is a chemotherapy drug used to treat cancer including breast cancer, leukemia, and lymphoma. This medicine can also be used to treat psoriasis and certain kinds of arthritis. This medicine may be used for other purposes; ask your health care provider or pharmacist if you have questions. COMMON BRAND NAME(S): Rheumatrex, Trexall What should I tell my health care provider before I take this medicine? They need to know if you have any of these conditions:  fluid in the stomach area or lungs  if you often drink alcohol  infection or immune system problems  kidney disease or on hemodialysis  liver disease  low blood counts, like low white cell, platelet, or red cell counts  lung disease  radiation therapy  stomach ulcers  ulcerative colitis  an unusual or allergic reaction to methotrexate, other medicines, foods, dyes, or preservatives  pregnant or trying to get pregnant  breast-feeding How should I  use this medicine? Take this medicine by mouth with a glass of water. Follow the directions on the prescription label. Take your medicine at regular intervals. Do not take it more often than directed. Do not stop taking except on your doctor's advice. Make sure you know why you are taking this medicine and how often you should take it. If this medicine is used for a condition that is not cancer, like arthritis or psoriasis, it should be taken weekly, NOT daily. Taking this medicine more often than directed can cause serious side effects, even death. Talk to your healthcare provider about safe handling and disposal of this medicine. You may need to take special precautions. Talk to your pediatrician regarding the use of this medicine in children. While this drug may be prescribed for selected conditions, precautions do apply. Overdosage: If you think you have taken too much of this medicine contact a poison control center or emergency room at once. NOTE: This medicine is only for you. Do not share this medicine with others. What if I miss a dose? If you miss a dose, talk with your doctor or health care professional. Do not take double or extra doses. What may interact with this medicine? This medicine may interact with the following medication:  acitretin  aspirin and aspirin-like medicines including salicylates  azathioprine  certain antibiotics like penicillins, tetracycline, and chloramphenicol  cyclosporine  gold  hydroxychloroquine  live virus vaccines  NSAIDs, medicines for pain and inflammation, like ibuprofen or naproxen  other  cytotoxic agents  penicillamine  phenylbutazone  phenytoin  probenecid  retinoids such as isotretinoin and tretinoin  steroid medicines like prednisone or cortisone  sulfonamides like sulfasalazine and trimethoprim/sulfamethoxazole  theophylline This list may not describe all possible interactions. Give your health care provider a list of  all the medicines, herbs, non-prescription drugs, or dietary supplements you use. Also tell them if you smoke, drink alcohol, or use illegal drugs. Some items may interact with your medicine. What should I watch for while using this medicine? Avoid alcoholic drinks. This medicine can make you more sensitive to the sun. Keep out of the sun. If you cannot avoid being in the sun, wear protective clothing and use sunscreen. Do not use sun lamps or tanning beds/booths. You may need blood work done while you are taking this medicine. Call your doctor or health care professional for advice if you get a fever, chills or sore throat, or other symptoms of a cold or flu. Do not treat yourself. This drug decreases your body's ability to fight infections. Try to avoid being around people who are sick. This medicine may increase your risk to bruise or bleed. Call your doctor or health care professional if you notice any unusual bleeding. Check with your doctor or health care professional if you get an attack of severe diarrhea, nausea and vomiting, or if you sweat a lot. The loss of too much body fluid can make it dangerous for you to take this medicine. Talk to your doctor about your risk of cancer. You may be more at risk for certain types of cancers if you take this medicine. Both men and women must use effective birth control with this medicine. Do not become pregnant while taking this medicine or until at least 1 normal menstrual cycle has occurred after stopping it. Women should inform their doctor if they wish to become pregnant or think they might be pregnant. Men should not father a child while taking this medicine and for 3 months after stopping it. There is a potential for serious side effects to an unborn child. Talk to your health care professional or pharmacist for more information. Do not breast-feed an infant while taking this medicine. What side effects may I notice from receiving this medicine? Side  effects that you should report to your doctor or health care professional as soon as possible:  allergic reactions like skin rash, itching or hives, swelling of the face, lips, or tongue  breathing problems or shortness of breath  diarrhea  dry, nonproductive cough  low blood counts - this medicine may decrease the number of white blood cells, red blood cells and platelets. You may be at increased risk for infections and bleeding.  mouth sores  redness, blistering, peeling or loosening of the skin, including inside the mouth  signs of infection - fever or chills, cough, sore throat, pain or trouble passing urine  signs and symptoms of bleeding such as bloody or black, tarry stools; red or dark-brown urine; spitting up blood or brown material that looks like coffee grounds; red spots on the skin; unusual bruising or bleeding from the eye, gums, or nose  signs and symptoms of kidney injury like trouble passing urine or change in the amount of urine  signs and symptoms of liver injury like dark yellow or brown urine; general ill feeling or flu-like symptoms; light-colored stools; loss of appetite; nausea; right upper belly pain; unusually weak or tired; yellowing of the eyes or skin Side effects that usually  do not require medical attention (report to your doctor or health care professional if they continue or are bothersome):  dizziness  hair loss  tiredness  upset stomach  vomiting This list may not describe all possible side effects. Call your doctor for medical advice about side effects. You may report side effects to FDA at 1-800-FDA-1088. Where should I keep my medicine? Keep out of the reach of children. Store at room temperature between 20 and 25 degrees C (68 and 77 degrees F). Protect from light. Throw away any unused medicine after the expiration date. NOTE: This sheet is a summary. It may not cover all possible information. If you have questions about this medicine,  talk to your doctor, pharmacist, or health care provider.  2020 Elsevier/Gold Standard (2017-07-14 13:38:43)

## 2019-06-13 MED ORDER — FOLIC ACID 1 MG PO TABS: 2.0000 mg | ORAL_TABLET | Freq: Every day | ORAL | 3 refills | Status: DC

## 2019-06-13 MED ORDER — METHOTREXATE 2.5 MG PO TABS: 15.0000 mg | ORAL_TABLET | ORAL | 2 refills | Status: DC

## 2019-06-13 NOTE — Telephone Encounter (Signed)
Received chest x-ray results. Prescription sent to the pharmacy. Patient advised.

## 2019-06-14 ENCOUNTER — Telehealth: Payer: Self-pay | Admitting: Pharmacist

## 2019-06-14 NOTE — Telephone Encounter (Signed)
Received voicemail from patient with question about methotrexate.  Return patient call.  She was confused with methotrexate dosing.  She thought she was going to take 1 tablet every week and a prescription was written for 6 tablets every week.  Confirm that she is to take 1 dose of 6 tablets every week.  Patient verbalized understanding.    Asked patient if she picked up the folic acid prescription and patient confirmed that she picked it up.  She plans to start taking methotrexate on Sunday.  She asked if she should go ahead and start taking the folic acid now.  Instructed patient to go ahead and start folic acid.  Instructed patient to return in 2 weeks which will be the week of July 27.  Patient verbalized understanding.  All questions encouraged and answered.  Instructed patient to call with any further questions or concerns.  Mariella Saa, PharmD, [facility] Clinical Pharmacist  06/14/2019 3:02 PM

## 2019-06-21 ENCOUNTER — Other Ambulatory Visit: Payer: Self-pay

## 2019-06-21 ENCOUNTER — Other Ambulatory Visit (HOSPITAL_COMMUNITY)
Admission: RE | Admit: 2019-06-21 | Discharge: 2019-06-21 | Disposition: A | Discharge status: Home / Self Care | Payer: PRIVATE HEALTH INSURANCE | Source: Ambulatory Visit | Attending: Gastroenterology | Admitting: Gastroenterology

## 2019-06-21 DIAGNOSIS — Z1159 Encounter for screening for other viral diseases: Secondary | ICD-10-CM | POA: Diagnosis not present

## 2019-06-21 LAB — SARS CORONAVIRUS 2 (TAT 6-24 HRS): SARS Coronavirus 2: NEGATIVE

## 2019-06-25 ENCOUNTER — Ambulatory Visit (HOSPITAL_COMMUNITY)
Admission: RE | Admit: 2019-06-25 | Discharge: 2019-06-25 | Disposition: A | Discharge status: Home / Self Care | Payer: PRIVATE HEALTH INSURANCE | Attending: Gastroenterology | Admitting: Gastroenterology

## 2019-06-25 ENCOUNTER — Telehealth: Payer: Self-pay | Admitting: Pharmacist

## 2019-06-25 ENCOUNTER — Encounter (HOSPITAL_COMMUNITY)
Admission: RE | Disposition: A | Discharge status: Home / Self Care | Payer: Self-pay | Source: Home / Self Care | Attending: Gastroenterology

## 2019-06-25 ENCOUNTER — Encounter (HOSPITAL_COMMUNITY): Payer: Self-pay

## 2019-06-25 ENCOUNTER — Other Ambulatory Visit: Payer: Self-pay

## 2019-06-25 DIAGNOSIS — Z1211 Encounter for screening for malignant neoplasm of colon: Secondary | ICD-10-CM | POA: Diagnosis present

## 2019-06-25 DIAGNOSIS — F329 Major depressive disorder, single episode, unspecified: Secondary | ICD-10-CM | POA: Diagnosis not present

## 2019-06-25 DIAGNOSIS — K644 Residual hemorrhoidal skin tags: Secondary | ICD-10-CM | POA: Insufficient documentation

## 2019-06-25 DIAGNOSIS — Z79899 Other long term (current) drug therapy: Secondary | ICD-10-CM | POA: Diagnosis not present

## 2019-06-25 DIAGNOSIS — I10 Essential (primary) hypertension: Secondary | ICD-10-CM | POA: Diagnosis not present

## 2019-06-25 DIAGNOSIS — Z87891 Personal history of nicotine dependence: Secondary | ICD-10-CM | POA: Diagnosis not present

## 2019-06-25 DIAGNOSIS — K635 Polyp of colon: Secondary | ICD-10-CM

## 2019-06-25 DIAGNOSIS — D123 Benign neoplasm of transverse colon: Secondary | ICD-10-CM | POA: Insufficient documentation

## 2019-06-25 DIAGNOSIS — Q439 Congenital malformation of intestine, unspecified: Secondary | ICD-10-CM | POA: Insufficient documentation

## 2019-06-25 DIAGNOSIS — K648 Other hemorrhoids: Secondary | ICD-10-CM | POA: Insufficient documentation

## 2019-06-25 HISTORY — PX: POLYPECTOMY: SHX5525

## 2019-06-25 HISTORY — PX: COLONOSCOPY: SHX5424

## 2019-06-25 SURGERY — COLONOSCOPY
Anesthesia: Moderate Sedation

## 2019-06-25 MED ORDER — MIDAZOLAM HCL 5 MG/5ML IJ SOLN
INTRAMUSCULAR | Status: AC
  Filled 2019-06-25: qty 10

## 2019-06-25 MED ORDER — MIDAZOLAM HCL 5 MG/5ML IJ SOLN
INTRAMUSCULAR | Status: DC | PRN
  Administered 2019-06-25 (×2): 2 mg via INTRAVENOUS

## 2019-06-25 MED ORDER — STERILE WATER FOR IRRIGATION IR SOLN
Status: DC | PRN
  Administered 2019-06-25: 1.5 mL

## 2019-06-25 MED ORDER — MEPERIDINE HCL 100 MG/ML IJ SOLN
INTRAMUSCULAR | Status: DC | PRN
  Administered 2019-06-25: 50 mg
  Administered 2019-06-25: 25 mg via INTRAVENOUS

## 2019-06-25 MED ORDER — SODIUM CHLORIDE 0.9 % IV SOLN
INTRAVENOUS | Status: DC
  Administered 2019-06-25: 11:00:00 via INTRAVENOUS

## 2019-06-25 MED ORDER — MEPERIDINE HCL 100 MG/ML IJ SOLN
INTRAMUSCULAR | Status: AC
  Filled 2019-06-25: qty 2

## 2019-06-25 NOTE — H&P (Signed)
Primary Care Physician:  [name], DO Primary Gastroenterologist:  [name]  Pre-Procedure History & Physical: HPI:  Jean Howell is a 63 y.o. female here for [place].  Past Medical History:  Diagnosis Date  . Arthritis   . Depression   . Hypertension     Past Surgical History:  Procedure Laterality Date  . BREAST SURGERY     nodules removed    Prior to Admission medications   Medication Sig Start Date End Date Taking? Authorizing Provider  amitriptyline (ELAVIL) 25 MG tablet Take 25 mg by mouth at bedtime.   Yes [provider]  celecoxib (CELEBREX) 200 MG capsule Take 200 mg by mouth daily as needed (pain).    Yes [provider]  folic acid (FOLVITE) 1 MG tablet Take 2 tablets (2 mg total) by mouth daily. 06/13/19  Yes Deveshwar, Abel Presto, MD  losartan-hydrochlorothiazide (HYZAAR) 100-12.5 MG tablet Take 1 tablet by mouth daily. 05/14/19  Yes Gottschalk, Leatrice Jewels M, DO  methotrexate (RHEUMATREX) 2.5 MG tablet Take 6 tablets (15 mg total) by mouth once a week. Caution:Chemotherapy. Protect from light. Patient taking differently: Take 15 mg by mouth every Sunday. Caution:Chemotherapy. Protect from light. 06/13/19  Yes Deveshwar, Abel Presto, MD  hydroxychloroquine (PLAQUENIL) 200 MG tablet Take 1 tablet (200 mg total) by mouth 2 (two) times daily. Patient not taking: Reported on 06/19/2019 06/11/19   Bo Merino, MD    Allergies as of 02/22/2019  . (No Known Allergies)    Family History  Problem Relation Age of Onset  . Cancer Father   . Colon cancer Father   . Thyroid disease Mother   . CVA Mother 15  . Thyroid disease Sister   . Healthy Son   . Congestive Heart Failure Sister   . Hypertension Sister   . Diabetes Sister   . Hypertension Sister   . Diabetes Sister   . Hypertension Sister   . Hypertension Sister   . Cancer Brother        kidney     Social History   Socioeconomic History  . Marital status: Widowed    Spouse  name: Not on file  . Number of children: 1  . Years of education: Not on file  . Highest education level: Not on file  Occupational History  . Not on file  Social Needs  . Financial resource strain: Not on file  . Food insecurity    Worry: Not on file    Inability: Not on file  . Transportation needs    Medical: Not on file    Non-medical: Not on file  Tobacco Use  . Smoking status: Former Smoker    Packs/day: 1.00    Years: 15.00    Pack years: 15.00    Types: Cigarettes    Quit date: 2000    Years since quitting: 20.5  . Smokeless tobacco: Never Used  Substance and Sexual Activity  . Alcohol use: Not Currently  . Drug use: Never  . Sexual activity: Not Currently    Comment: widowed  Lifestyle  . Physical activity    Days per week: Not on file    Minutes per session: Not on file  . Stress: Not on file  Relationships  . Social Herbalist on phone: Not on file    Gets together: Not on file    Attends religious service: Not on file    Active member of club or organization: Not on file  Attends meetings of clubs or organizations: Not on file    Relationship status: Not on file  . Intimate partner violence    Fear of current or ex partner: Not on file    Emotionally abused: Not on file    Physically abused: Not on file    Forced sexual activity: Not on file  Other Topics Concern  . Not on file  Social History Narrative   Patient is a widow that resides independently in [place].  She has 1 son, who resides in [place].    Review of Systems: See HPI, otherwise negative ROS   Physical Exam: BP 137/82   Temp 97.8 F (36.6 C) (Oral)   Resp 20   SpO2 100%  General:   Alert,  pleasant and cooperative in NAD Head:  Normocephalic and atraumatic. Neck:  Supple; Lungs:  Clear throughout to auscultation.    Heart:  Regular rate and rhythm. Abdomen:  Soft, nontender and nondistended. Normal bowel sounds, without guarding, and without rebound.    Neurologic:  Alert and  oriented x4;  grossly normal neurologically.  Impression/Plan:    SCREENING  Plan:  1. TCS TODAY DISCUSSED PROCEDURE, BENEFITS, & RISKS: < 1% chance of medication reaction, bleeding, perforation, ASPIRATION, or rupture of spleen/liver requiring surgery to fix it and missed polyps < 1 cm 10-20% of the time.

## 2019-06-25 NOTE — Op Note (Signed)
[facility] Patient Name: Jean Howell Procedure Date: 06/25/2019 12:07 PM MRN: [id number] Date of Birth: 1956/11/22 Attending MD: Barney Drain MD, MD CSN: [id number] Age: 63 Admit Type: Outpatient Procedure:                Colonoscopy WITH COLD SNARE POLYPECTOMY Indications:              Screening for colorectal malignant neoplasm Providers:                Barney Drain MD, MD, Charlsie Quest. Theda Sers RN, RN,                            Raphael Gibney, Technician Referring MD:             Koleen Distance. Gottschalk Medicines:                Meperidine 75 mg IV, Midazolam 4 mg IV Complications:            No immediate complications. Estimated Blood Loss:     Estimated blood loss was minimal. Procedure:                Pre-Anesthesia Assessment:                           - Prior to the procedure, a History and Physical                            was performed, and patient medications and                            allergies were reviewed. The patient's tolerance of                            previous anesthesia was also reviewed. The risks                            and benefits of the procedure and the sedation                            options and risks were discussed with the patient.                            All questions were answered, and informed consent                            was obtained. Prior Anticoagulants: The patient has                            taken no previous anticoagulant or antiplatelet                            agents except for NSAID medication. ASA Grade                            Assessment: II - A patient with mild systemic  disease. After reviewing the risks and benefits,                            the patient was deemed in satisfactory condition to                            undergo the procedure. After obtaining informed                            consent, the colonoscope was passed under direct                            vision.  Throughout the procedure, the patient's                            blood pressure, pulse, and oxygen saturations were                            monitored continuously. The [id number]-[id number] ([id number])                            scope was introduced through the anus and advanced                            to the the cecum, identified by appendiceal orifice                            and ileocecal valve. The colonoscopy was somewhat                            difficult due to a tortuous colon. Successful                            completion of the procedure was aided by                            straightening and shortening the scope to obtain                            bowel loop reduction and COLOWRAP. The patient                            tolerated the procedure well. The quality of the                            bowel preparation was excellent. The ileocecal                            valve, appendiceal orifice, and rectum were                            photographed. Scope In: 12:25:47 PM Scope Out: 12:43:06 PM Scope Withdrawal Time: 0 hours 15 minutes 24 seconds  Total Procedure Duration: 0  hours 17 minutes 19 seconds  Findings:      Two sessile polyps were found in the hepatic flexure. The polyps were 5       to 6 mm in size. These polyps were removed with a cold snare. Resection       and retrieval were complete.      The recto-sigmoid colon and sigmoid colon were mildly tortuous.      External and internal hemorrhoids were found. Impression:               - Two 5 to 6 mm polyps at the hepatic flexure,                            removed with a cold snare. Resected and retrieved.                           - Tortuous LEFT colon.                           - External and internal hemorrhoids. Moderate Sedation:      Moderate (conscious) sedation was administered by the endoscopy nurse       and supervised by the endoscopist. The following parameters were       monitored: oxygen  saturation, heart rate, blood pressure, and response       to care. Total physician intraservice time was 28 minutes. Recommendation:           - Patient has a contact number available for                            emergencies. The signs and symptoms of potential                            delayed complications were discussed with the                            patient. Return to normal activities tomorrow.                            Written discharge instructions were provided to the                            patient.                           - High fiber diet.                           - Continue present medications.                           - Await pathology results.                           - Repeat colonoscopy in 5-10 years for surveillance. Procedure Code(s):        --- Professional ---  [id number], Colonoscopy, flexible; with removal of                            tumor(s), polyp(s), or other lesion(s) by snare                            technique                           99153, Moderate sedation; each additional 15                            minutes intraservice time                           G0500, Moderate sedation services provided by the                            same physician or other qualified health care                            professional performing a gastrointestinal                            endoscopic service that sedation supports,                            requiring the presence of an independent trained                            observer to assist in the monitoring of the                            patient's level of consciousness and physiological                            status; initial 15 minutes of intra-service time;                            patient age 53 years or older (additional time may                            be reported with [id number], as appropriate) Diagnosis Code(s):        --- Professional ---                            Z12.11, Encounter for screening for malignant                            neoplasm of colon                           K63.5, Polyp of colon                           K64.8, Other hemorrhoids  Q43.8, Other specified congenital malformations of                            intestine CPT copyright 2019 American Medical Association. All rights reserved. The codes documented in this report are preliminary and upon coder review may  be revised to meet current compliance requirements. Barney Drain, MD Barney Drain MD, MD 06/25/2019 12:55:43 PM This report has been signed electronically. Number of Addenda: 0

## 2019-06-25 NOTE — Telephone Encounter (Signed)
Received voicemail from patient on Sunday afternoon.  She is scheduled to have colonoscopy and doing the bowel prep regimen.  She takes her MTX on the weekend.  She was instructed to take her MTX with food but she is unable to eat due to her bowel prep.  She wanted to know if she should take her MTX or hold until she is able to take with food.  Returned patient call but line was busy.  Will try again at a later time.

## 2019-06-25 NOTE — Telephone Encounter (Signed)
Left message that I was returning her call with family member.

## 2019-06-25 NOTE — Discharge Instructions (Addendum)
You had 2 polyps removed. You have internal AND EXTERNAL hemorrhoids.   DRINK WATER TO KEEP YOUR URINE LIGHT YELLOW. Do not drink SODA, DIET SODA, OR ENERGY DRINKS. AVOID HIGH FRUCTOSE CORN SYRUP. DO NOT Chew SUGAR FREE GUM, OR USE ARTIFICIAL SWEETENERS. USE STEVIA ONLY AS A SWEETENER.  FOLLOW A HIGH FIBER DIET. AVOID ITEMS THAT CAUSE BLOATING & GAS. SEE INFO BELOW.  CONTINUE YOUR WEIGHT LOSS EFFORTS. YOUR BODY MASS INDEX IS OVER 40 WHICH MEANS YOU ARE MORBIDLY OBESE. OBESITY IS ASSOCIATED WITH AN INCREASED FOR CIRRHOSIS AND ALL CANCERS, INCLUDING ESOPHAGEAL AND COLON CANCER  AND DECREASES YOUR LIFE EXPECTANCY 10 YEARS. I RECOMMEND YOU READ AND FOLLOW RECOMMENDATIONS BY DR. MARK HYMAN, "10-DAY DETOX DIET". IF YOU TRY AND CANNOT LOSE WEIGHT OVER THE NEXT YEAR, PLEASE CALL FOR A REFERRAL TO THE BARIATRIC WEIGHT LOSS CLINIC.   YOUR BIOPSY RESULTS WILL BE BACK IN 5 BUSINESS DAYS.  Next colonoscopy BETWEEN AGE 38-71.   Colonoscopy Care After Read the instructions outlined below and refer to this sheet in the next week. These discharge instructions provide you with general information on caring for yourself after you leave the hospital. While your treatment has been planned according to the most current medical practices available, unavoidable complications occasionally occur. If you have any problems or questions after discharge, call DR. Shaconda Hajduk, [phone].  ACTIVITY  You may resume your regular activity, but move at a slower pace for the next 24 hours.   Take frequent rest periods for the next 24 hours.   Walking will help get rid of the air and reduce the bloated feeling in your belly (abdomen).   No driving for 24 hours (because of the medicine (anesthesia) used during the test).   You may shower.   Do not sign any important legal documents or operate any machinery for 24 hours (because of the anesthesia used during the test).    NUTRITION  Drink plenty of fluids.   You may  resume your normal diet as instructed by your doctor.   Begin with a light meal and progress to your normal diet. Heavy or fried foods are harder to digest and may make you feel sick to your stomach (nauseated).   Avoid alcoholic beverages for 24 hours or as instructed.    MEDICATIONS  You may resume your normal medications.   WHAT YOU CAN EXPECT TODAY  Some feelings of bloating in the abdomen.   Passage of more gas than usual.   Spotting of blood in your stool or on the toilet paper  .  IF YOU HAD POLYPS REMOVED DURING THE COLONOSCOPY:  Eat a soft diet IF YOU HAVE NAUSEA, BLOATING, ABDOMINAL PAIN, OR VOMITING.    FINDING OUT THE RESULTS OF YOUR TEST Not all test results are available during your visit. [name] WILL CALL YOU WITHIN 14 DAYS OF YOUR PROCEDUE WITH YOUR RESULTS. Do not assume everything is normal if you have not heard from DR. Solace Wendorff, CALL HER OFFICE AT [phone].  SEEK IMMEDIATE MEDICAL ATTENTION AND CALL THE OFFICE: [phone] IF:  You have more than a spotting of blood in your stool.   Your belly is swollen (abdominal distention).   You are nauseated or vomiting.   You have a temperature over 101F.   You have abdominal pain or discomfort that is severe or gets worse throughout the day.   High-Fiber Diet A high-fiber diet changes your normal diet to include more whole grains, legumes, fruits, and vegetables. Changes in  the diet involve replacing refined carbohydrates with unrefined foods. The calorie level of the diet is essentially unchanged. The Dietary Reference Intake (recommended amount) for adult males is 38 grams per day. For adult females, it is 25 grams per day. Pregnant and lactating women should consume 28 grams of fiber per day. Fiber is the intact part of a plant that is not broken down during digestion. Functional fiber is fiber that has been isolated from the plant to provide a beneficial effect in the body.  PURPOSE  Increase stool  bulk.   Ease and regulate bowel movements.   Lower cholesterol.   REDUCE RISK OF COLON CANCER  INDICATIONS THAT YOU NEED MORE FIBER  Constipation and hemorrhoids.   Uncomplicated diverticulosis (intestine condition) and irritable bowel syndrome.   Weight management.   As a protective measure against hardening of the arteries (atherosclerosis), diabetes, and cancer.   GUIDELINES FOR INCREASING FIBER IN THE DIET  Start adding fiber to the diet slowly. A gradual increase of about 5 more grams (2 slices of whole-wheat bread, 2 servings of most fruits or vegetables, or 1 bowl of high-fiber cereal) per day is best. Too rapid an increase in fiber may result in constipation, flatulence, and bloating.   Drink enough water and fluids to keep your urine clear or pale yellow. Water, juice, or caffeine-free drinks are recommended. Not drinking enough fluid may cause constipation.   Eat a variety of high-fiber foods rather than one type of fiber.   Try to increase your intake of fiber through using high-fiber foods rather than fiber pills or supplements that contain small amounts of fiber.   The goal is to change the types of food eaten. Do not supplement your present diet with high-fiber foods, but replace foods in your present diet.   INCLUDE A VARIETY OF FIBER SOURCES  Replace refined and processed grains with whole grains, canned fruits with fresh fruits, and incorporate other fiber sources. White rice, white breads, and most bakery goods contain little or no fiber.   Brown whole-grain rice, buckwheat oats, and many fruits and vegetables are all good sources of fiber. These include: broccoli, Brussels sprouts, cabbage, cauliflower, beets, sweet potatoes, white potatoes (skin on), carrots, tomatoes, eggplant, squash, berries, fresh fruits, and dried fruits.   Cereals appear to be the richest source of fiber. Cereal fiber is found in whole grains and bran. Bran is the fiber-rich outer coat of  cereal grain, which is largely removed in refining. In whole-grain cereals, the bran remains. In breakfast cereals, the largest amount of fiber is found in those with "bran" in their names. The fiber content is sometimes indicated on the label.   You may need to include additional fruits and vegetables each day.   In baking, for 1 cup white flour, you may use the following substitutions:   1 cup whole-wheat flour minus 2 tablespoons.   1/2 cup white flour plus 1/2 cup whole-wheat flour.   Polyps, Colon  A polyp is extra tissue that grows inside your body. Colon polyps grow in the large intestine. The large intestine, also called the colon, is part of your digestive system. It is a long, hollow tube at the end of your digestive tract where your body makes and stores stool. Most polyps are not dangerous. They are benign. This means they are not cancerous. But over time, some types of polyps can turn into cancer. Polyps that are smaller than a pea are usually not harmful. But larger polyps  could someday become or may already be cancerous. To be safe, doctors remove all polyps and test them.   WHO GETS POLYPS? Anyone can get polyps, but certain people are more likely than others. You may have a greater chance of getting polyps if:  You are over 50.   You have had polyps before.   Someone in your family has had polyps.   Someone in your family has had cancer of the large intestine.   Find out if someone in your family has had polyps. You may also be more likely to get polyps if you:   Eat a lot of fatty foods   Smoke   Drink alcohol   Do not exercise  Eat too much   PREVENTION There is not one sure way to prevent polyps. You might be able to lower your risk of getting them if you:  Eat more fruits and vegetables and less fatty food.   Do not smoke.   Avoid alcohol.   Exercise every day.   Lose weight if you are overweight.   Eating more calcium and folate can also lower your  risk of getting polyps. Some foods that are rich in calcium are milk, cheese, and broccoli. Some foods that are rich in folate are chickpeas, kidney beans, and spinach.

## 2019-06-28 NOTE — Telephone Encounter (Signed)
Patient returned call.  She took her MTX on Sunday.  She wanted to know if she should continue taking with food.  Advised patient if she tolerated the MtX without issue with no food it was not necessary.  Patient verbalized understanding.

## 2019-07-02 ENCOUNTER — Encounter (HOSPITAL_COMMUNITY): Payer: Self-pay | Admitting: Gastroenterology

## 2019-07-04 ENCOUNTER — Telehealth: Payer: Self-pay

## 2019-07-04 ENCOUNTER — Other Ambulatory Visit: Payer: Self-pay

## 2019-07-04 DIAGNOSIS — Z79899 Other long term (current) drug therapy: Secondary | ICD-10-CM

## 2019-07-04 NOTE — Telephone Encounter (Signed)
Pt received letter to call for path results and is now aware.

## 2019-07-05 LAB — CBC WITH DIFFERENTIAL/PLATELET
Absolute Monocytes: 273 cells/uL (ref 200–950)
Basophils Absolute: 39 cells/uL (ref 0–200)
Basophils Relative: 0.6 %
Eosinophils Absolute: 202 cells/uL (ref 15–500)
Eosinophils Relative: 3.1 %
HCT: 37.9 % (ref 35.0–45.0)
Hemoglobin: 12.1 g/dL (ref 11.7–15.5)
Lymphs Abs: 1801 cells/uL (ref 850–3900)
MCH: 26.3 pg — ABNORMAL LOW (ref 27.0–33.0)
MCHC: 31.9 g/dL — ABNORMAL LOW (ref 32.0–36.0)
MCV: 82.4 fL (ref 80.0–100.0)
MPV: 9 fL (ref 7.5–12.5)
Monocytes Relative: 4.2 %
Neutro Abs: 4186 cells/uL (ref 1500–7800)
Neutrophils Relative %: 64.4 %
Platelets: 402 10*3/uL — ABNORMAL HIGH (ref 140–400)
RBC: 4.6 10*6/uL (ref 3.80–5.10)
RDW: 16.5 % — ABNORMAL HIGH (ref 11.0–15.0)
Total Lymphocyte: 27.7 %
WBC: 6.5 10*3/uL (ref 3.8–10.8)

## 2019-07-05 LAB — COMPLETE METABOLIC PANEL WITH GFR
AG Ratio: 0.8 (calc) — ABNORMAL LOW (ref 1.0–2.5)
ALT: 19 U/L (ref 6–29)
AST: 15 U/L (ref 10–35)
Albumin: 3.5 g/dL — ABNORMAL LOW (ref 3.6–5.1)
Alkaline phosphatase (APISO): 78 U/L (ref 37–153)
BUN/Creatinine Ratio: 19 (calc) (ref 6–22)
BUN: 25 mg/dL (ref 7–25)
CO2: 30 mmol/L (ref 20–32)
Calcium: 9.3 mg/dL (ref 8.6–10.4)
Chloride: 100 mmol/L (ref 98–110)
Creat: 1.29 mg/dL — ABNORMAL HIGH (ref 0.50–0.99)
GFR, Est African American: 51 mL/min/{1.73_m2} — ABNORMAL LOW (ref >=60)
GFR, Est Non African American: 44 mL/min/{1.73_m2} — ABNORMAL LOW (ref >=60)
Globulin: 4.2 g/dL (calc) — ABNORMAL HIGH (ref 1.9–3.7)
Glucose, Bld: 99 mg/dL (ref 65–99)
Potassium: 4.6 mmol/L (ref 3.5–5.3)
Sodium: 137 mmol/L (ref 135–146)
Total Bilirubin: 0.4 mg/dL (ref 0.2–1.2)
Total Protein: 7.7 g/dL (ref 6.1–8.1)

## 2019-07-05 NOTE — Progress Notes (Signed)
Creatinine is elevated-1.29 and GFR is 51.  Please clarify if the patient continues to take Celebrex.  Please advise patient to avoid NSAIDs.  CBC stable

## 2019-07-25 ENCOUNTER — Other Ambulatory Visit: Payer: Self-pay

## 2019-07-25 DIAGNOSIS — Z79899 Other long term (current) drug therapy: Secondary | ICD-10-CM

## 2019-07-26 LAB — COMPLETE METABOLIC PANEL WITH GFR
AG Ratio: 1 (calc) (ref 1.0–2.5)
ALT: 25 U/L (ref 6–29)
AST: 17 U/L (ref 10–35)
Albumin: 3.8 g/dL (ref 3.6–5.1)
Alkaline phosphatase (APISO): 66 U/L (ref 37–153)
BUN/Creatinine Ratio: 16 (calc) (ref 6–22)
BUN: 18 mg/dL (ref 7–25)
CO2: 27 mmol/L (ref 20–32)
Calcium: 9.9 mg/dL (ref 8.6–10.4)
Chloride: 100 mmol/L (ref 98–110)
Creat: 1.15 mg/dL — ABNORMAL HIGH (ref 0.50–0.99)
GFR, Est African American: 58 mL/min/{1.73_m2} — ABNORMAL LOW (ref >=60)
GFR, Est Non African American: 50 mL/min/{1.73_m2} — ABNORMAL LOW (ref >=60)
Globulin: 3.8 g/dL (calc) — ABNORMAL HIGH (ref 1.9–3.7)
Glucose, Bld: 99 mg/dL (ref 65–99)
Potassium: 4.6 mmol/L (ref 3.5–5.3)
Sodium: 139 mmol/L (ref 135–146)
Total Bilirubin: 0.5 mg/dL (ref 0.2–1.2)
Total Protein: 7.6 g/dL (ref 6.1–8.1)

## 2019-07-26 LAB — CBC WITH DIFFERENTIAL/PLATELET
Absolute Monocytes: 348 cells/uL (ref 200–950)
Basophils Absolute: 40 cells/uL (ref 0–200)
Basophils Relative: 0.6 %
Eosinophils Absolute: 127 cells/uL (ref 15–500)
Eosinophils Relative: 1.9 %
HCT: 37.4 % (ref 35.0–45.0)
Hemoglobin: 12.4 g/dL (ref 11.7–15.5)
Lymphs Abs: 1809 cells/uL (ref 850–3900)
MCH: 27.9 pg (ref 27.0–33.0)
MCHC: 33.2 g/dL (ref 32.0–36.0)
MCV: 84.2 fL (ref 80.0–100.0)
MPV: 9.2 fL (ref 7.5–12.5)
Monocytes Relative: 5.2 %
Neutro Abs: 4375 cells/uL (ref 1500–7800)
Neutrophils Relative %: 65.3 %
Platelets: 395 10*3/uL (ref 140–400)
RBC: 4.44 10*6/uL (ref 3.80–5.10)
RDW: 17.1 % — ABNORMAL HIGH (ref 11.0–15.0)
Total Lymphocyte: 27 %
WBC: 6.7 10*3/uL (ref 3.8–10.8)

## 2019-07-26 NOTE — Progress Notes (Signed)
Creatinine borderline elevated-1.15 but improved.  GFR improved-50.  RDW elevated. Rest of CBC WNL

## 2019-08-10 ENCOUNTER — Other Ambulatory Visit: Payer: Self-pay | Admitting: Rheumatology

## 2019-08-10 ENCOUNTER — Other Ambulatory Visit: Payer: Self-pay | Admitting: Family Medicine

## 2019-08-10 DIAGNOSIS — M2550 Pain in unspecified joint: Secondary | ICD-10-CM

## 2019-08-10 NOTE — Telephone Encounter (Signed)
Please have schedule 3 m follow up.  We focused on HTN at last visit.

## 2019-09-05 ENCOUNTER — Telehealth: Payer: Self-pay | Admitting: Rheumatology

## 2019-09-05 NOTE — Telephone Encounter (Signed)
Patient left a voicemail requesting a return call regarding her knee pain.  Patient states she has been unable to exercise due to pain.

## 2019-09-05 NOTE — Telephone Encounter (Signed)
Spoke with patient and schedule patient for an evaluation with Dr. Estanislado Pandy on 09/06/19 at 9:15 am.

## 2019-09-05 NOTE — Progress Notes (Signed)
Office Visit Note  Patient: Jean Howell             Date of Birth: Feb 08, 1956           MRN: [id number]             PCP: [name], DO Referring: [name], DO Visit Date: 09/06/2019 Occupation: @GUAROCC @  Subjective:  Right knee pain and swelling   History of Present Illness: Jean Howell is a 63 y.o. female with history of seropositive rheumatoid arthritis and osteoarthritis.  Patient was started on methotrexate 6 tablets by mouth once weekly and folic acid 2 mg by mouth daily at the beginning of July.  She has been taking methotrexate for most 3 months and has not noticed much improvement.  She states she has not noticed any improvement since switching from methotrexate to Plaquenil. She continues to have pain in the right wrist, right knee, and left ankle joint.  She has warmth and inflammation in the right knee and left ankle.  She states that she discontinued taking Celebrex once she started on methotrexate.  She has not tried icing or elevating her ankle or knee joint.  She has tried using a walker but it exacerbates her right wrist pain.  She denies any recent falls.  She continues to have discomfort intermittently in both shoulders and both hands.     Activities of Daily Living:  Patient reports joint stiffness all day  Patient Reports nocturnal pain.  Difficulty dressing/grooming: Reports Difficulty climbing stairs: Reports Difficulty getting out of chair: Reports Difficulty using hands for taps, buttons, cutlery, and/or writing: Reports  Review of Systems  Constitutional: Positive for fatigue.  HENT: Negative for mouth sores, mouth dryness and nose dryness.   Eyes: Negative for pain, itching, visual disturbance and dryness.  Respiratory: Negative for cough, hemoptysis, shortness of breath, wheezing and difficulty breathing.   Cardiovascular: Negative for chest pain, palpitations, hypertension and swelling in legs/feet.  Gastrointestinal: Negative  for abdominal pain, blood in stool, constipation, diarrhea, nausea and vomiting.  Endocrine: Negative for increased urination.  Genitourinary: Negative for difficulty urinating and painful urination.  Musculoskeletal: Positive for arthralgias, joint pain, joint swelling and morning stiffness. Negative for myalgias, muscle weakness, muscle tenderness and myalgias.  Skin: Negative for color change, pallor, rash, hair loss, nodules/bumps, skin tightness, ulcers and sensitivity to sunlight.  Allergic/Immunologic: Negative for susceptible to infections.  Neurological: Negative for dizziness, light-headedness, headaches and memory loss.  Hematological: Negative for swollen glands.  Psychiatric/Behavioral: Positive for sleep disturbance. Negative for depressed mood and confusion. The patient is not nervous/anxious.     PMFS History:  Patient Active Problem List   Diagnosis Date Noted  . Special screening for malignant neoplasms, colon   . Rheumatoid arthritis involving multiple sites with positive rheumatoid factor ([place]) 03/06/2019  . Primary osteoarthritis of both knees 03/06/2019  . Former smoker 03/06/2019  . ANA positive 02/12/2019  . Family history of thyroid disease 01/08/2019  . Morbid obesity ([place]) 01/08/2019  . Essential hypertension 01/08/2019  . Polyarthralgia 01/08/2019  . Family history of colon cancer 01/08/2019    Past Medical History:  Diagnosis Date  . Arthritis   . Depression   . Hypertension     Family History  Problem Relation Age of Onset  . Cancer Father   . Colon cancer Father   . Thyroid disease Mother   . CVA Mother 7  . Thyroid disease Sister   . Healthy Son   . Congestive  Heart Failure Sister   . Hypertension Sister   . Diabetes Sister   . Hypertension Sister   . Diabetes Sister   . Hypertension Sister   . Hypertension Sister   . Cancer Brother        kidney    Past Surgical History:  Procedure Laterality Date  . BREAST SURGERY     nodules  removed  . COLONOSCOPY N/A 06/25/2019   Procedure: COLONOSCOPY;  Surgeon: Danie Binder, MD;  Location: [facility];  Service: Endoscopy;  Laterality: N/A;  1:30  . POLYPECTOMY  06/25/2019   Procedure: POLYPECTOMY;  Surgeon: Danie Binder, MD;  Location: [facility];  Service: Endoscopy;;   Social History   Social History Narrative   Patient is a widow that resides independently in [place].  She has 1 son, who resides in [place].    There is no immunization history on file for this patient.   Objective: Vital Signs: BP 132/89 (BP Location: Left Arm, Patient Position: Sitting, Cuff Size: Normal)   Pulse 86   Resp 16   Ht 5\' 4"  (1.626 m)   Wt 234 lb 3.2 oz (106.2 kg)   BMI 40.20 kg/m    Physical Exam Vitals signs and nursing note reviewed.  Constitutional:      Appearance: She is well-developed.  HENT:     Head: Normocephalic and atraumatic.  Eyes:     Conjunctiva/sclera: Conjunctivae normal.  Neck:     Musculoskeletal: Normal range of motion.  Cardiovascular:     Rate and Rhythm: Normal rate and regular rhythm.     Heart sounds: Normal heart sounds.  Pulmonary:     Effort: Pulmonary effort is normal.     Breath sounds: Normal breath sounds.  Abdominal:     General: Bowel sounds are normal.     Palpations: Abdomen is soft.  Lymphadenopathy:     Cervical: No cervical adenopathy.  Skin:    General: Skin is warm and dry.     Capillary Refill: Capillary refill takes less than 2 seconds.  Neurological:     Mental Status: She is alert and oriented to person, place, and time.  Psychiatric:        Behavior: Behavior normal.      Musculoskeletal Exam: C-spine, thoracic spine, lumbar spine good range of motion.  Shoulders, elbow joints, wrist joints, MCPs, PIPs, DIPs good range of motion no synovitis.  She has complete fist formation bilaterally.  Hip joints have good range of motion no discomfort.  Right knee has limited range of motion with discomfort.   Right knee warmth and swelling noted.  Left knee no warmth or effusion.  She has tenderness warmth, and swelling of the left ankle joint.  Right ankle has good range of motion no tenderness or inflammation.  CDAI Exam: CDAI Score: - Patient Global: -; Provider Global: - Swollen: 2 ; Tender: 3  Joint Exam      Right  Left  Wrist   Tender     Knee  Swollen Tender     Ankle     Swollen Tender     Investigation: No additional findings.  Imaging: No results found.  Recent Labs: Lab Results  Component Value Date   WBC 6.7 07/25/2019   HGB 12.4 07/25/2019   PLT 395 07/25/2019   NA 139 07/25/2019   K 4.6 07/25/2019   CL 100 07/25/2019   CO2 27 07/25/2019   GLUCOSE 99 07/25/2019   BUN 18  07/25/2019   CREATININE 1.15 (H) 07/25/2019   BILITOT 0.5 07/25/2019   ALKPHOS 97 05/14/2019   AST 17 07/25/2019   ALT 25 07/25/2019   PROT 7.6 07/25/2019   ALBUMIN 3.5 (L) 05/14/2019   CALCIUM 9.9 07/25/2019   GFRAA 58 (L) 07/25/2019   QFTBGOLDPLUS NEGATIVE 02/26/2019    Speciality Comments: PLQ Eye Exam: 12/16/2018 WNL @ Dr Anthony Sar Follow up in 1 year  Procedures:  No procedures performed Allergies: Patient has no known allergies.   Assessment / Plan:     Visit Diagnoses: Rheumatoid arthritis involving multiple sites with positive rheumatoid factor (HCC) - Positive RF, +14-3-3 eta, Positive ANA, erosive inflammatory arthritis: She has severe pain and inflammation in the right knee joint and left ankle joint.  She previously had an adequate response to Plaquenil and was switched to methotrexate in July 2020.  She has been taking methotrexate for about 3 months and has only noticed minimal improvement.  She has tenderness, warmth, and inflammation of the right knee joint and left ankle joint.  She has tenderness of the right wrist joint.  She has been taking methotrexate 6 tablets by mouth once weekly and folic acid 2 mg by mouth daily.  Creatinine was 1.15 and GFR was 58 on 07/25/2019.  She  declined a cortisone injection today.  We discussed adding on an anti-TNF to her current treatment regimen.  Indications, contraindications, potential side effects of Humira were discussed today.  All questions were addressed and consent was obtained.  We will apply for Humira, and once it has been approved we will schedule a nurse visit for the administration of the first injection.  She will follow up in the office in 6 weeks.   Counseled patient that Humira is a TNF blocking agent.  Counseled patient on purpose, proper use, and adverse effects of Humira.  Reviewed the most common adverse effects including infections, headache, and injection site reactions. Discussed that there is the possibility of an increased risk of malignancy but it is not well understood if this increased risk is due to the medication or the disease state.  Advised patient to get yearly dermatology exams due to risk of skin cancer. Counseled patient that Humira should be held prior to scheduled surgery.  Counseled patient to avoid live vaccines while on Humira.  Advised patient to get annual influenza vaccine and the pneumococcal vaccine as indicated.    Reviewed the importance of regular labs while on Humira therapy.  Standing orders placed.  Provided patient with medication education material and answered all questions.  Patient consented to Humira.  Will upload consent into the media tab.  Reviewed storage instructions of Humira.  Advised initial injection must be administered in office.  Patient verbalized understanding.  Dose will be for rheumatoid arthritis Humira 40 mg every 14 days.  Prescription pending lab results and/or insurance approval.  High risk medication use -methotrexate 6 tablets by mouth once weekly and folic acid 2 mg by mouth daily.  She is on reduced dose of methotrexate due to elevated creatinine-1.15 on 07/15/1819.  She previously had an inadequate response to Plaquenil. PLQ eye Exam: 12/16/2018  Primary  osteoarthritis and rheumatoid arthritis of both knees - Right knee severe OA, Left knee moderate OA: She is having severe pain in the right knee joint.  She has discomfort with range of motion and warmth and swelling on exam.  She has no left knee joint pain at this time.  No warmth or effusion of  the left knee was noted.  She declined a cortisone injection in the right knee today.  She has not tried ice or elevation.  She is not taking any over-the-counter products for pain relief.  She has tried walking with a walker but it exacerbated the right wrist pain.  Swelling of ankle joint, left: She has tenderness, warmth, and swelling of the left ankle joint on examination today.  She has not noticed any improvement since switching from Plaquenil to methotrexate.  She has been taking methotrexate 6 tablets by mouth once weekly for about 3 months.  ANA positive:  Positive ANA-no titer, Scl-70+: She has no other clinical features of autoimmune disease.   Other fatigue: Chronic and related to insomnia.   Other medical conditions are listed as follows:   Essential hypertension  Family history of colon cancer  Family history of thyroid disease  History of depression  Former smoker  Orders: No orders of the defined types were placed in this encounter.  No orders of the defined types were placed in this encounter.   Face-to-face time spent with patient was 30 minutes. Greater than 50% of time was spent in counseling and coordination of care.  Follow-Up Instructions: Return in 6 weeks (on 10/18/2019) for Rheumatoid arthritis, Osteoarthritis.   Ofilia Neas, PA-C   I examined and evaluated the patient with Hazel Sams PA.  Patient had significant synovitis on examination today.  She had an inadequate response to methotrexate.  Different treatment options and their side effects were discussed at length.  She was in agreement to proceed with Humira.  We will apply for Humira.  The plan of care was  discussed as noted above.  Bo Merino, MD  Note - This record has been created using Editor, commissioning.  Chart creation errors have been sought, but may not always  have been located. Such creation errors do not reflect on  the standard of medical care.

## 2019-09-06 ENCOUNTER — Telehealth: Payer: Self-pay | Admitting: Pharmacist

## 2019-09-06 ENCOUNTER — Encounter: Payer: Self-pay | Admitting: Rheumatology

## 2019-09-06 ENCOUNTER — Other Ambulatory Visit: Payer: Self-pay

## 2019-09-06 ENCOUNTER — Ambulatory Visit (INDEPENDENT_AMBULATORY_CARE_PROVIDER_SITE_OTHER): Payer: Medicare Other | Admitting: Rheumatology

## 2019-09-06 VITALS — BP 132/89 | HR 86 | Resp 16 | Ht 64.0 in | Wt 234.2 lb

## 2019-09-06 DIAGNOSIS — Z8659 Personal history of other mental and behavioral disorders: Secondary | ICD-10-CM

## 2019-09-06 DIAGNOSIS — M0579 Rheumatoid arthritis with rheumatoid factor of multiple sites without organ or systems involvement: Secondary | ICD-10-CM

## 2019-09-06 DIAGNOSIS — Z79899 Other long term (current) drug therapy: Secondary | ICD-10-CM | POA: Diagnosis not present

## 2019-09-06 DIAGNOSIS — M25472 Effusion, left ankle: Secondary | ICD-10-CM

## 2019-09-06 DIAGNOSIS — Z87891 Personal history of nicotine dependence: Secondary | ICD-10-CM

## 2019-09-06 DIAGNOSIS — R5383 Other fatigue: Secondary | ICD-10-CM

## 2019-09-06 DIAGNOSIS — M170 Bilateral primary osteoarthritis of knee: Secondary | ICD-10-CM | POA: Diagnosis not present

## 2019-09-06 DIAGNOSIS — R768 Other specified abnormal immunological findings in serum: Secondary | ICD-10-CM

## 2019-09-06 DIAGNOSIS — I10 Essential (primary) hypertension: Secondary | ICD-10-CM

## 2019-09-06 DIAGNOSIS — Z8349 Family history of other endocrine, nutritional and metabolic diseases: Secondary | ICD-10-CM

## 2019-09-06 DIAGNOSIS — Z800 Family history of malignant neoplasm of digestive organs: Secondary | ICD-10-CM

## 2019-09-06 NOTE — Telephone Encounter (Signed)
Received notification from [facility] regarding a prior authorization for [place]. Authorization has been APPROVED from 09/06/19 to 12/05/20.   Will send document to scan center.  Authorization # [id number] Phone # [phone]   Ran test claim, patient's plan did not show any pharmacy restrictions.She could use [facility].  Copay for 1 month supply is $8.95  12:57 PM Beatriz Chancellor, CPhT

## 2019-09-06 NOTE — Patient Instructions (Addendum)
We will apply for approval for Humira through insurance and update when we hear a response.  Standing Labs We placed an order today for your standing lab work.    Please come back and get your standing labs in November and every 3 months   We have open lab daily Monday through Thursday from 8:30-12:30 PM and 1:30-4:30 PM and Friday from 8:30-12:30 PM and 1:30-4:00 PM at the office of Dr. Bo Merino.   You may experience shorter wait times on Monday and Friday afternoons. The office is located at [address], [place], [place], [place] [address] No appointment is necessary.   Labs are drawn by [facility].  You may receive a bill from [facility] for your lab work.  If you wish to have your labs drawn at another location, please call the office 24 hours in advance to send orders.  If you have any questions regarding directions or hours of operation,  please call [phone].   Just as a reminder please drink plenty of water prior to coming for your lab work. Thanks!  Vaccines You are taking a medication(s) that can suppress your immune system.  The following immunizations are recommended: . Flu annually . Pneumonia (Pneumovax 23 and Prevnar 13 spaced at least 1 year apart) . Shingrix  Please check with your PCP to make sure you are up to date.  In addition to staying up to date on lab work and vaccines it is important to:  Marland Kitchen Yearly skin checks with dermatologist due to increase risk in skin cancer with TNF inhibitors .  Adalimumab Injection What is this medicine? ADALIMUMAB (a dal AYE mu mab) is used to treat rheumatoid and psoriatic arthritis. It is also used to treat ankylosing spondylitis, Crohn's disease, ulcerative colitis, plaque psoriasis, hidradenitis suppurativa, and uveitis. This medicine may be used for other purposes; ask your health care provider or pharmacist if you have questions. COMMON BRAND NAME(S): CYLTEZO, Humira What should I tell my health care provider  before I take this medicine? They need to know if you have any of these conditions:  diabetes  heart disease  hepatitis B or history of hepatitis B infection  immune system problems  infection or history of infections  multiple sclerosis  recently received or scheduled to receive a vaccine  scheduled to have surgery  tuberculosis, a positive skin test for tuberculosis or have recently been in close contact with someone who has tuberculosis  an unusual reaction to adalimumab, other medicines, mannitol, latex, rubber, foods, dyes, or preservatives  pregnant or trying to get pregnant  breast-feeding How should I use this medicine? This medicine is for injection under the skin. You will be taught how to prepare and give this medicine. Use exactly as directed. Take your medicine at regular intervals. Do not take your medicine more often than directed. A special MedGuide will be given to you by the pharmacist with each prescription and refill. Be sure to read this information carefully each time. It is important that you put your used needles and syringes in a special sharps container. Do not put them in a trash can. If you do not have a sharps container, call your pharmacist or healthcare provider to get one. Talk to your pediatrician regarding the use of this medicine in children. While this drug may be prescribed for children as young as 2 years for selected conditions, precautions do apply. The manufacturer of the medicine offers free information to patients and their health care partners. Call  [phone] for more information. Overdosage: If you think you have taken too much of this medicine contact a poison control center or emergency room at once. NOTE: This medicine is only for you. Do not share this medicine with others. What if I miss a dose? If you miss a dose, take it as soon as you can. If it is almost time for your next dose, take only that dose. Do not take double or  extra doses. Give the next dose when your next scheduled dose is due. Call your doctor or health care professional if you are not sure how to handle a missed dose. What may interact with this medicine? Do not take this medicine with any of the following medications:  abatacept  anakinra  etanercept  infliximab  live virus vaccines  rilonacept This medicine may also interact with the following medications:  vaccines This list may not describe all possible interactions. Give your health care provider a list of all the medicines, herbs, non-prescription drugs, or dietary supplements you use. Also tell them if you smoke, drink alcohol, or use illegal drugs. Some items may interact with your medicine. What should I watch for while using this medicine? Visit your doctor or health care professional for regular checks on your progress. Tell your doctor or healthcare professional if your symptoms do not start to get better or if they get worse. You will be tested for tuberculosis (TB) before you start this medicine. If your doctor prescribes any medicine for TB, you should start taking the TB medicine before starting this medicine. Make sure to finish the full course of TB medicine. Call your doctor or health care professional if you get a cold or other infection while receiving this medicine. Do not treat yourself. This medicine may decrease your body's ability to fight infection. Talk to your doctor about your risk of cancer. You may be more at risk for certain types of cancers if you take this medicine. What side effects may I notice from receiving this medicine? Side effects that you should report to your doctor or health care professional as soon as possible:  allergic reactions like skin rash, itching or hives, swelling of the face, lips, or tongue  breathing problems  changes in vision  chest pain  fever, chills, or any other sign of infection  numbness or tingling  red, scaly  patches or raised bumps on the skin  swelling of the ankles  swollen lymph nodes in the neck, underarm, or groin areas  unexplained weight loss  unusual bleeding or bruising  unusually weak or tired Side effects that usually do not require medical attention (report to your doctor or health care professional if they continue or are bothersome):  headache  nausea  redness, itching, swelling, or bruising at site where injected This list may not describe all possible side effects. Call your doctor for medical advice about side effects. You may report side effects to FDA at 1-800-FDA-1088. Where should I keep my medicine? Keep out of the reach of children. Store in the original container and in the refrigerator between 2 and 8 degrees C (36 and 46 degrees F). Do not freeze. The product may be stored in a cool carrier with an ice pack, if needed. Protect from light. Throw away any unused medicine after the expiration date. NOTE: This sheet is a summary. It may not cover all possible information. If you have questions about this medicine, talk to your doctor, pharmacist, or health care provider.  2020 Elsevier/Gold Standard (2018-09-11 13:22:46)

## 2019-09-06 NOTE — Telephone Encounter (Signed)
Please start benefits investigation for Humira for RA.  Patient has tried Plaquenil and MTX with inadequate response.

## 2019-09-06 NOTE — Telephone Encounter (Signed)
Patient notified.  Scheduled new start visit for 10/5 at 10AM.

## 2019-09-06 NOTE — Telephone Encounter (Signed)
Submitted a Prior Authorization request to [facility] [name] via Cover My Meds. Will update once we receive a response.  12:47 PM Beatriz Chancellor, CPhT

## 2019-09-06 NOTE — Progress Notes (Signed)
Pharmacy Note Subjective: Patient presents today to the [facility] to see Dr. Estanislado Pandy.   Patient seen by the pharmacist for counseling on Humira for rheumatoid arthritis.  Prior therapy includes: Plaquenil and methotrexate with inadequate response.  Objective:  CBC    Component Value Date/Time   WBC 6.7 07/25/2019 1429   RBC 4.44 07/25/2019 1429   HGB 12.4 07/25/2019 1429   HGB 11.4 05/14/2019 1606   HCT 37.4 07/25/2019 1429   HCT 34.1 05/14/2019 1606   PLT 395 07/25/2019 1429   PLT 387 05/14/2019 1606   MCV 84.2 07/25/2019 1429   MCV 79 05/14/2019 1606   MCH 27.9 07/25/2019 1429   MCHC 33.2 07/25/2019 1429   RDW 17.1 (H) 07/25/2019 1429   RDW 15.2 05/14/2019 1606   LYMPHSABS 1,809 07/25/2019 1429   EOSABS 127 07/25/2019 1429   BASOSABS 40 07/25/2019 1429     CMP     Component Value Date/Time   NA 139 07/25/2019 1429   NA 136 05/14/2019 1606   K 4.6 07/25/2019 1429   CL 100 07/25/2019 1429   CO2 27 07/25/2019 1429   GLUCOSE 99 07/25/2019 1429   BUN 18 07/25/2019 1429   BUN 18 05/14/2019 1606   CREATININE 1.15 (H) 07/25/2019 1429   CALCIUM 9.9 07/25/2019 1429   PROT 7.6 07/25/2019 1429   PROT 7.5 05/14/2019 1606   ALBUMIN 3.5 (L) 05/14/2019 1606   AST 17 07/25/2019 1429   ALT 25 07/25/2019 1429   ALKPHOS 97 05/14/2019 1606   BILITOT 0.5 07/25/2019 1429   BILITOT <0.2 05/14/2019 1606   GFRNONAA 50 (L) 07/25/2019 1429   GFRAA 58 (L) 07/25/2019 1429      Baseline Immunosuppressant Therapy Labs TB GOLD Quantiferon TB Gold Latest Ref Rng & Units 02/26/2019  Quantiferon TB Gold Plus NEGATIVE NEGATIVE   Hepatitis Panel Hepatitis Latest Ref Rng & Units 02/26/2019  Hep B Surface Ag NON-REACTI NON-REACTIVE  Hep B IgM NON-REACTI NON-REACTIVE   HIV Lab Results  Component Value Date   HIV Non Reactive 01/08/2019   Immunoglobulins Immunoglobulin Electrophoresis Latest Ref Rng & Units 02/26/2019  IgA  70 - 320 mg/dL 967(H)  IgG 600 - 1,540 mg/dL  1,848(H)  IgM 50 - 300 mg/dL 118   SPEP Serum Protein Electrophoresis Latest Ref Rng & Units 07/25/2019  Total Protein 6.1 - 8.1 g/dL 7.6  Albumin 3.8 - 4.8 g/dL -  Alpha-1 0.2 - 0.3 g/dL -  Alpha-2 0.5 - 0.9 g/dL -  Beta Globulin 0.4 - 0.6 g/dL -  Beta 2 0.2 - 0.5 g/dL -  Gamma Globulin 0.8 - 1.7 g/dL -   G6PD Lab Results  Component Value Date   G6PDH 14.3 02/26/2019   TPMT No results found for: TPMT   Chest x-ray: no active cardiopulmonary disease 06/11/2019  Does patient have diagnosis of heart failure?  No  Assessment/Plan:  Counseled patient that Humira is a TNF blocking agent.  Counseled patient on purpose, proper use, and adverse effects of Humira.  Reviewed the most common adverse effects including infections, headache, and injection site reactions. Discussed that there is the possibility of an increased risk of malignancy but it is not well understood if this increased risk is due to the medication or the disease state.  Advised patient to get yearly dermatology exams due to risk of skin cancer. Counseled patient that Humira should be held prior to scheduled surgery.  Counseled patient to avoid live vaccines while on Humira.  Advised  patient to get annual influenza vaccine and the pneumococcal vaccine as indicated.    Reviewed the importance of regular labs while on Humira therapy.  Standing orders placed.  Provided patient with medication education material and answered all questions.  Patient consented to Humira.  Will upload consent into the media tab.  Reviewed storage instructions of Humira.  Advised initial injection must be administered in office.  Patient verbalized understanding.  Dose will be for rheumatoid arthritis Humira 40 mg every 14 days.  Prescription pending lab results and/or insurance approval.  All questions encouraged and answered.  Instructed patient to call with any questions or concerns.   Mariella Saa, PharmD, [place]  Pharmac[facility] [phone]  09/06/2019 10:11 AM

## 2019-09-07 ENCOUNTER — Other Ambulatory Visit: Payer: Self-pay | Admitting: Rheumatology

## 2019-09-07 NOTE — Telephone Encounter (Signed)
Last Visit: 09/06/19 Next Visit: 10/08/19 Labs: 07/25/19 Creatinine borderline elevated-1.15 but improved. GFR improved-50. RDW elevated. Rest of CBC WNL.   Okay to refill per Dr. Estanislado Pandy

## 2019-09-10 ENCOUNTER — Other Ambulatory Visit: Payer: Self-pay

## 2019-09-10 ENCOUNTER — Ambulatory Visit (INDEPENDENT_AMBULATORY_CARE_PROVIDER_SITE_OTHER): Payer: Medicare Other | Admitting: Pharmacist

## 2019-09-10 VITALS — BP 132/86 | HR 90

## 2019-09-10 DIAGNOSIS — M0579 Rheumatoid arthritis with rheumatoid factor of multiple sites without organ or systems involvement: Secondary | ICD-10-CM

## 2019-09-10 MED ORDER — HUMIRA (2 PEN) 40 MG/0.4ML ~~LOC~~ AJKT: 40.0000 mg | AUTO-INJECTOR | Freq: Once | SUBCUTANEOUS | 0 refills | Status: DC

## 2019-09-10 MED ORDER — HUMIRA (2 PEN) 40 MG/0.4ML ~~LOC~~ AJKT: 40.0000 mg | AUTO-INJECTOR | SUBCUTANEOUS | 0 refills | Status: DC

## 2019-09-10 NOTE — Progress Notes (Signed)
Pharmacy Note  Subjective:   Patient is being initiated on Humira.  Patient was previously counseled extensively on and consented to initiation of Humira at that time.  Patient presents to clinic today to receive the first dose of Humira.     Objective: CMP     Component Value Date/Time   NA 139 07/25/2019 1429   NA 136 05/14/2019 1606   K 4.6 07/25/2019 1429   CL 100 07/25/2019 1429   CO2 27 07/25/2019 1429   GLUCOSE 99 07/25/2019 1429   BUN 18 07/25/2019 1429   BUN 18 05/14/2019 1606   CREATININE 1.15 (H) 07/25/2019 1429   CALCIUM 9.9 07/25/2019 1429   PROT 7.6 07/25/2019 1429   PROT 7.5 05/14/2019 1606   ALBUMIN 3.5 (L) 05/14/2019 1606   AST 17 07/25/2019 1429   ALT 25 07/25/2019 1429   ALKPHOS 97 05/14/2019 1606   BILITOT 0.5 07/25/2019 1429   BILITOT <0.2 05/14/2019 1606   GFRNONAA 50 (L) 07/25/2019 1429   GFRAA 58 (L) 07/25/2019 1429    CBC    Component Value Date/Time   WBC 6.7 07/25/2019 1429   RBC 4.44 07/25/2019 1429   HGB 12.4 07/25/2019 1429   HGB 11.4 05/14/2019 1606   HCT 37.4 07/25/2019 1429   HCT 34.1 05/14/2019 1606   PLT 395 07/25/2019 1429   PLT 387 05/14/2019 1606   MCV 84.2 07/25/2019 1429   MCV 79 05/14/2019 1606   MCH 27.9 07/25/2019 1429   MCHC 33.2 07/25/2019 1429   RDW 17.1 (H) 07/25/2019 1429   RDW 15.2 05/14/2019 1606   LYMPHSABS 1,809 07/25/2019 1429   EOSABS 127 07/25/2019 1429   BASOSABS 40 07/25/2019 1429    Baseline Immunosuppressant Therapy Labs TB GOLD Quantiferon TB Gold Latest Ref Rng & Units 02/26/2019  Quantiferon TB Gold Plus NEGATIVE NEGATIVE   Hepatitis Panel Hepatitis Latest Ref Rng & Units 02/26/2019  Hep B Surface Ag NON-REACTI NON-REACTIVE  Hep B IgM NON-REACTI NON-REACTIVE   HIV Lab Results  Component Value Date   HIV Non Reactive 01/08/2019   Immunoglobulins Immunoglobulin Electrophoresis Latest Ref Rng & Units 02/26/2019  IgA  70 - 320 mg/dL 967(H)  IgG 600 - 1,540 mg/dL 1,848(H)  IgM 50 - 300 mg/dL  118   SPEP Serum Protein Electrophoresis Latest Ref Rng & Units 07/25/2019  Total Protein 6.1 - 8.1 g/dL 7.6  Albumin 3.8 - 4.8 g/dL -  Alpha-1 0.2 - 0.3 g/dL -  Alpha-2 0.5 - 0.9 g/dL -  Beta Globulin 0.4 - 0.6 g/dL -  Beta 2 0.2 - 0.5 g/dL -  Gamma Globulin 0.8 - 1.7 g/dL -   G6PD Lab Results  Component Value Date   G6PDH 14.3 02/26/2019   TPMT No results found for: TPMT   Chest x-ray: no active cardiopulmonary disease  Patient running a fever or have signs/symptoms of infection? No  Assessment/Plan:   Counseled patient that Humira is a TNF blocking agent.  Counseled patient on purpose, proper use, and adverse effects of Humira.  Reviewed the most common adverse effects including infections, headache, and injection site reactions. Discussed that there is the possibility of an increased risk of malignancy but it is not well understood if this increased risk is due to the medication or the disease state.  Advised patient to get yearly dermatology exams due to risk of skin cancer. Counseled patient that Humira should be held prior to scheduled surgery.  Counseled patient to avoid live vaccines while on Humira.  Advised patient to get annual influenza vaccine and the pneumococcal vaccine as indicated.    Demonstrated proper injection technique with Humira demo pen.  Patient able to demonstrate proper injection technique using the teach back method.  Patient self injected in the right anterior thigh with:  Sample Medication: Humira 40 mg/0.4 ml NDC: [id number] Lot: [id number] Expiration: 05/2020  Patient tolerated well.  Observed for 30 mins in office for adverse reaction and none noted.   Patient is to return in 1 month for labs.  Standing orders placed. Prescription sent to [facility] per patient's request.  All questions encouraged and answered.  Instructed patient to call with any further questions or concerns.  Mariella Saa, PharmD, [facility] Clinical Pharmacist  09/10/2019  10:11 AM

## 2019-09-10 NOTE — Telephone Encounter (Signed)
Patient's 1st shipment of Humira from [facility] is scheduled for 09/17/19 to deliver to the patient on 09/18/19.

## 2019-09-10 NOTE — Patient Instructions (Addendum)
Standing Labs We placed an order today for your standing lab work.    Please come back and get your standing labs in 1 month and then every 3 months.  We have open lab daily Monday through Thursday from 8:30-12:30 PM and 1:30-4:30 PM and Friday from 8:30-12:30 PM and 1:30-4:00 PM at the office of Dr. Bo Merino.   You may experience shorter wait times on Monday and Friday afternoons. The office is located at [address], [place], [place], [place] [address] No appointment is necessary.   Labs are drawn by [facility].  You may receive a bill from [facility] for your lab work.  If you wish to have your labs drawn at another location, please call the office 24 hours in advance to send orders.  If you have any questions regarding directions or hours of operation,  please call [phone].   Just as a reminder please drink plenty of water prior to coming for your lab work. Thanks!  In addition to staying up to date on lab work and vaccines it is important to:  Marland Kitchen Yearly skin checks with dermatologist due to increase risk in skin cancer with TNF inhibitors .  Helpful Tips for Injecting To help alleviate pain and injection site reactions consider the following tips:  . Placing something cold (like and ice gel pack or cold water bottle) on the injection site just before cleansing with alcohol may help reduce pain . If you have a localized reaction (redness, mild swelling, warmth, and itching) you can use topical corticosteroids (hydrocortisone cream) or antihistamine (Claritin) to help minimize reaction the day of, the day before, and the day after injecting . Always inject this medication at room temperature (remove from refrigerator 15-20 minutes before injecting; this may help eliminate stinging). . Always rotate your injection sites using inside/outside thigh of both legs, abdomen divided into 4 quadrants (stay away from waist line, and 2" away from navel). . Do not inject into areas  where skin is tender, bruised, red, or hard or where there are scars or stretch marks. . Always let alcohol dry on the skin before injecting. . Place a cold, damp towel or small ice pack on the injection site for 10 or 15 minutes every 1 to 2 hours if it hurts or is swollen.

## 2019-09-17 MED FILL — HUMIRA PEN 40 MG/0.4ML PNKT: 40 | 84 days supply | Qty: 6 | Fill #0

## 2019-09-19 ENCOUNTER — Telehealth: Payer: Self-pay | Admitting: Rheumatology

## 2019-09-19 NOTE — Telephone Encounter (Signed)
Patient returned call to the office and left message asking if she was okay to get the flu vaccination while on Humira. Left message to advise patient she may get the flu vaccine.

## 2019-09-19 NOTE — Telephone Encounter (Signed)
Patient called requesting a return call regarding the flu vaccine.

## 2019-09-19 NOTE — Telephone Encounter (Signed)
Attempted to contact patient and left message for patient to call the office.  

## 2019-10-03 NOTE — Progress Notes (Signed)
Office Visit Note  Patient: Jean Howell             Date of Birth: 19-Nov-1956           MRN: [id number]             PCP: [name], DO Referring: [name], DO Visit Date: 10/16/2019 Occupation: @GUAROCC @  Subjective:  Right knee joint pain    History of Present Illness: Sheindy Klipp is a 63 y.o. female with history of seropositive rheumatoid arthritis and osteoarthritis.  She was started on Humira on 09/10/19.  She has had 3 injections so far.  She continues to take Methotrexate 6 tablets by mouth once weekly and folic acid 2 mg po daily.  She continues to have pain and intermittent swelling in the right knee joint. she states her left leg feels "heavy." She has noticed less discomfort in both hands and both wrist joints.  She is having less difficulty with ADLs.  She has been able to write better.  Activities of Daily Living:  Patient reports joint stiffness all day.  Patient Reports nocturnal pain.  Difficulty dressing/grooming: Reports Difficulty climbing stairs: Reports Difficulty getting out of chair: Reports Difficulty using hands for taps, buttons, cutlery, and/or writing: Reports  Review of Systems  Constitutional: Positive for fatigue.  HENT: Negative for mouth sores, mouth dryness and nose dryness.   Eyes: Negative for pain, visual disturbance and dryness.  Respiratory: Negative for cough, hemoptysis, shortness of breath and difficulty breathing.   Cardiovascular: Negative for chest pain, palpitations and hypertension.  Gastrointestinal: Negative for blood in stool, constipation and diarrhea.  Endocrine: Negative for increased urination.  Genitourinary: Negative for difficulty urinating and painful urination.  Musculoskeletal: Positive for arthralgias, joint pain, joint swelling, muscle weakness, morning stiffness and muscle tenderness. Negative for myalgias and myalgias.  Skin: Negative for color change, pallor, rash, hair loss, nodules/bumps,  skin tightness, ulcers and sensitivity to sunlight.  Allergic/Immunologic: Negative for susceptible to infections.  Neurological: Negative for dizziness and headaches.  Hematological: Negative for bruising/bleeding tendency and swollen glands.  Psychiatric/Behavioral: Positive for sleep disturbance. Negative for depressed mood. The patient is not nervous/anxious.     PMFS History:  Patient Active Problem List   Diagnosis Date Noted  . Special screening for malignant neoplasms, colon   . Rheumatoid arthritis involving multiple sites with positive rheumatoid factor ([place]) 03/06/2019  . Primary osteoarthritis of both knees 03/06/2019  . Former smoker 03/06/2019  . ANA positive 02/12/2019  . Family history of thyroid disease 01/08/2019  . Morbid obesity ([place]) 01/08/2019  . Essential hypertension 01/08/2019  . Polyarthralgia 01/08/2019  . Family history of colon cancer 01/08/2019    Past Medical History:  Diagnosis Date  . Arthritis   . Depression   . Hypertension   . Rheumatoid arthritis ([place])     Family History  Problem Relation Age of Onset  . Cancer Father   . Colon cancer Father   . Thyroid disease Mother   . CVA Mother 41  . Thyroid disease Sister   . Healthy Son   . Congestive Heart Failure Sister   . Hypertension Sister   . Diabetes Sister   . Hypertension Sister   . Diabetes Sister   . Hypertension Sister   . Hypertension Sister   . Cancer Brother        kidney    Past Surgical History:  Procedure Laterality Date  . BREAST SURGERY     nodules removed  .  COLONOSCOPY N/A 06/25/2019   Procedure: COLONOSCOPY;  Surgeon: Danie Binder, MD;  [facility];  Service: Endoscopy;  Laterality: N/A;  1:30  . POLYPECTOMY  06/25/2019   Procedure: POLYPECTOMY;  Surgeon: Danie Binder, MD;  [facility];  Service: Endoscopy;;   Social History   Social History Narrative   Patient is a widow that resides independently in [place].  She has 1 son,  who resides in [place].    There is no immunization history on file for this patient.   Objective: Vital Signs: BP 134/86 (BP Location: Left Arm, Patient Position: Sitting, Cuff Size: Normal)   Pulse 81   Resp 16   Ht 5\' 3"  (1.6 m)   Wt 236 lb 6.4 oz (107.2 kg)   BMI 41.88 kg/m    Physical Exam Vitals signs and nursing note reviewed.  Constitutional:      Appearance: She is well-developed.  HENT:     Head: Normocephalic and atraumatic.  Eyes:     Conjunctiva/sclera: Conjunctivae normal.  Neck:     Musculoskeletal: Normal range of motion.  Cardiovascular:     Rate and Rhythm: Normal rate and regular rhythm.     Heart sounds: Normal heart sounds.  Pulmonary:     Effort: Pulmonary effort is normal.     Breath sounds: Normal breath sounds.  Abdominal:     General: Bowel sounds are normal.     Palpations: Abdomen is soft.  Lymphadenopathy:     Cervical: No cervical adenopathy.  Skin:    General: Skin is warm and dry.     Capillary Refill: Capillary refill takes less than 2 seconds.  Neurological:     Mental Status: She is alert and oriented to person, place, and time.  Psychiatric:        Behavior: Behavior normal.      Musculoskeletal Exam: C-spine, thoracic spine, and lumbar spine good ROM.  Left shoulder ROM limited with discomfort.  Right shoulder full ROM with no discomfort.  Elbow joints good ROM with no tenderness or inflammation.  Wrist joints, MCPs, PIPs, and DIPs good ROM with no synovitis.  PIP and DIP synovial thickening consistent with osteoarthritis of both hands.  Hip joints, knee joints, ankle joints, MTPs, PIPs, and DIPs good ROM with no synovitis.  Right knee warmth but no effusion. Left ankle joint tenderness and swelling.   CDAI Exam: CDAI Score: 3.4  Patient Global: 7 mm; Provider Global: 7 mm Swollen: 2 ; Tender: 2  Joint Exam      Right  Left  Knee  Swollen Tender     Ankle     Swollen Tender     Investigation: No additional  findings.  Imaging: No results found.  Recent Labs: Lab Results  Component Value Date   WBC 6.2 10/11/2019   HGB 12.6 10/11/2019   PLT 393 10/11/2019   NA 139 10/11/2019   K 4.2 10/11/2019   CL 100 10/11/2019   CO2 29 10/11/2019   GLUCOSE 96 10/11/2019   BUN 15 10/11/2019   CREATININE 1.18 (H) 10/11/2019   BILITOT 0.4 10/11/2019   ALKPHOS 97 05/14/2019   AST 17 10/11/2019   ALT 18 10/11/2019   PROT 7.4 10/11/2019   ALBUMIN 3.5 (L) 05/14/2019   CALCIUM 9.4 10/11/2019   GFRAA 56 (L) 10/11/2019   QFTBGOLDPLUS NEGATIVE 02/26/2019    Speciality Comments: PLQ Eye Exam: 12/16/2018 WNL @ Dr Anthony Sar Follow up in 1 year  Procedures:  Large Joint Inj: R knee on 10/16/2019 1:45 PM Indications: pain Details: 27 G 1.5 in needle, medial approach  Arthrogram: No  Medications: 1.5 mL lidocaine 1 %; 40 mg triamcinolone acetonide 40 MG/ML Aspirate: 0 mL Outcome: tolerated well, no immediate complications Procedure, treatment alternatives, risks and benefits explained, specific risks discussed. Consent was given by the patient. Immediately prior to procedure a time out was called to verify the correct patient, procedure, equipment, support staff and site/side marked as required. Patient was prepped and draped in the usual sterile fashion.     Allergies: Patient has no known allergies.   Assessment / Plan:     Visit Diagnoses: Rheumatoid arthritis involving multiple sites with positive rheumatoid factor (HCC) - Positive RF, +14-3-3 eta, Positive ANA, erosive inflammatory arthritis: She has ongoing right knee joint warmth and inflammation and left ankle joint tenderness and synovitis.  She had a right knee joint cortisone injection today in the office.  She was started on Humira 40 mg subcutaneous injections every 14 days on 09/10/2019.  She has had 3 injections of Humira.  She continues to take methotrexate 6 tablets by mouth once weekly and folic acid 2 mg by mouth daily.  She has not missed  any doses of these medications recently.  She has noticed improvement since starting on Humira.  She has had less pain and stiffness in both hands and both wrist joints.  She will continue on the current treatment regimen.  She was advised to notify us if she develops increased joint pain or joint swelling.  She will follow-up in the office in 3 months.  High risk medication use - Humira 40 mg every 14 days, methotrexate 6 tablets every 7 days, and folic acid 2 mg daily. Last TB gold negative on 02/26/2019 and will monitor yearly.  Most recent CBC/CMP within normal limits except for elevated creatinine and decreased GFR but stable on 10/11/2019.    Primary osteoarthritis and rheumatoid arthritis of both knees: She has chronic pain in bilateral knee joints.  The pain is most severe in the right knee.  She had a right knee joint cortisone injection today.  We will refer her to physical therapy for lower extremity muscle strengthening.   Chronic pain of right knee: She presents today with ongoing right knee joint pain.  She has no mechanical symptoms at this time.  She has warmth in the right knee joint on exam today.  She has pain with range of motion.  Different treatment options were discussed.  She requested a right knee joint cortisone injection.  She tolerated the procedure well.  The procedure note was completed above.  Aftercare was discussed.  ANA positive - Positive ANA-no titer, Scl-70+: She has no other clinical features of autoimmune disease.   Other fatigue: She has chronic fatigue which has been stable.  Other medical conditions are listed as follows:   Essential hypertension  History of depression  Former smoker  Family history of colon cancer  Family history of thyroid disease  Orders: Orders Placed This Encounter  Procedures  . Large Joint Inj   No orders of the defined types were placed in this encounter.   Face-to-face time spent with patient was 30 minutes. Greater  than 50% of time was spent in counseling and coordination of care.  Follow-Up Instructions: Return in about 3 months (around 01/16/2020) for Rheumatoid arthritis, Osteoarthritis.   Ofilia Neas, PA-C   I examined and evaluated the patient with Hazel Sams PA.  Patient recently started Humira and she is doing much better.  She still had some warmth and swelling in her right knee joint.  After different treatment options were discussed right knee joint was injected with cortisone which she tolerated well.  We will see response to Humira over the next few months.  The plan of care was discussed as noted above.  Bo Merino, MD  Note - This record has been created using Editor, commissioning.  Chart creation errors have been sought, but may not always  have been located. Such creation errors do not reflect on  the standard of medical care.

## 2019-10-11 ENCOUNTER — Other Ambulatory Visit: Payer: Self-pay

## 2019-10-11 ENCOUNTER — Telehealth: Payer: Self-pay | Admitting: Rheumatology

## 2019-10-11 DIAGNOSIS — Z79899 Other long term (current) drug therapy: Secondary | ICD-10-CM | POA: Diagnosis not present

## 2019-10-11 NOTE — Telephone Encounter (Signed)
Patient called requesting a return call to let her know If she is due to come in today for labwork.

## 2019-10-11 NOTE — Telephone Encounter (Signed)
Patient advised to come into the office for labs today.

## 2019-10-12 LAB — CBC WITH DIFFERENTIAL/PLATELET
Absolute Monocytes: 260 cells/uL (ref 200–950)
Basophils Absolute: 37 cells/uL (ref 0–200)
Basophils Relative: 0.6 %
Eosinophils Absolute: 242 cells/uL (ref 15–500)
Eosinophils Relative: 3.9 %
HCT: 37.3 % (ref 35.0–45.0)
Hemoglobin: 12.6 g/dL (ref 11.7–15.5)
Lymphs Abs: 2468 cells/uL (ref 850–3900)
MCH: 29.8 pg (ref 27.0–33.0)
MCHC: 33.8 g/dL (ref 32.0–36.0)
MCV: 88.2 fL (ref 80.0–100.0)
MPV: 9.6 fL (ref 7.5–12.5)
Monocytes Relative: 4.2 %
Neutro Abs: 3193 cells/uL (ref 1500–7800)
Neutrophils Relative %: 51.5 %
Platelets: 393 10*3/uL (ref 140–400)
RBC: 4.23 10*6/uL (ref 3.80–5.10)
RDW: 14.9 % (ref 11.0–15.0)
Total Lymphocyte: 39.8 %
WBC: 6.2 10*3/uL (ref 3.8–10.8)

## 2019-10-12 LAB — COMPLETE METABOLIC PANEL WITH GFR
AG Ratio: 1.1 (calc) (ref 1.0–2.5)
ALT: 18 U/L (ref 6–29)
AST: 17 U/L (ref 10–35)
Albumin: 3.8 g/dL (ref 3.6–5.1)
Alkaline phosphatase (APISO): 71 U/L (ref 37–153)
BUN/Creatinine Ratio: 13 (calc) (ref 6–22)
BUN: 15 mg/dL (ref 7–25)
CO2: 29 mmol/L (ref 20–32)
Calcium: 9.4 mg/dL (ref 8.6–10.4)
Chloride: 100 mmol/L (ref 98–110)
Creat: 1.18 mg/dL — ABNORMAL HIGH (ref 0.50–0.99)
GFR, Est African American: 56 mL/min/{1.73_m2} — ABNORMAL LOW (ref >=60)
GFR, Est Non African American: 48 mL/min/{1.73_m2} — ABNORMAL LOW (ref >=60)
Globulin: 3.6 g/dL (calc) (ref 1.9–3.7)
Glucose, Bld: 96 mg/dL (ref 65–99)
Potassium: 4.2 mmol/L (ref 3.5–5.3)
Sodium: 139 mmol/L (ref 135–146)
Total Bilirubin: 0.4 mg/dL (ref 0.2–1.2)
Total Protein: 7.4 g/dL (ref 6.1–8.1)

## 2019-10-15 NOTE — Progress Notes (Signed)
Creatinine elevated but stable.  GFR is stable.  CBC WNL

## 2019-10-16 ENCOUNTER — Other Ambulatory Visit: Payer: Self-pay

## 2019-10-16 ENCOUNTER — Encounter: Payer: Self-pay | Admitting: Rheumatology

## 2019-10-16 ENCOUNTER — Ambulatory Visit (INDEPENDENT_AMBULATORY_CARE_PROVIDER_SITE_OTHER): Payer: Medicare Other | Admitting: Rheumatology

## 2019-10-16 VITALS — BP 134/86 | HR 81 | Resp 16 | Ht 63.0 in | Wt 236.4 lb

## 2019-10-16 DIAGNOSIS — Z8349 Family history of other endocrine, nutritional and metabolic diseases: Secondary | ICD-10-CM

## 2019-10-16 DIAGNOSIS — Z87891 Personal history of nicotine dependence: Secondary | ICD-10-CM

## 2019-10-16 DIAGNOSIS — G8929 Other chronic pain: Secondary | ICD-10-CM | POA: Diagnosis not present

## 2019-10-16 DIAGNOSIS — Z79899 Other long term (current) drug therapy: Secondary | ICD-10-CM

## 2019-10-16 DIAGNOSIS — M170 Bilateral primary osteoarthritis of knee: Secondary | ICD-10-CM

## 2019-10-16 DIAGNOSIS — R5383 Other fatigue: Secondary | ICD-10-CM

## 2019-10-16 DIAGNOSIS — M25561 Pain in right knee: Secondary | ICD-10-CM

## 2019-10-16 DIAGNOSIS — M0579 Rheumatoid arthritis with rheumatoid factor of multiple sites without organ or systems involvement: Secondary | ICD-10-CM | POA: Diagnosis not present

## 2019-10-16 DIAGNOSIS — Z8659 Personal history of other mental and behavioral disorders: Secondary | ICD-10-CM

## 2019-10-16 DIAGNOSIS — Z800 Family history of malignant neoplasm of digestive organs: Secondary | ICD-10-CM

## 2019-10-16 DIAGNOSIS — R7689 Other specified abnormal immunological findings in serum: Secondary | ICD-10-CM

## 2019-10-16 DIAGNOSIS — I10 Essential (primary) hypertension: Secondary | ICD-10-CM

## 2019-10-16 DIAGNOSIS — R768 Other specified abnormal immunological findings in serum: Secondary | ICD-10-CM

## 2019-10-16 MED ORDER — TRIAMCINOLONE ACETONIDE 40 MG/ML IJ SUSP
40.0000 mg | INTRAMUSCULAR | Status: AC | PRN
  Administered 2019-10-16: 40 mg via INTRA_ARTICULAR

## 2019-10-16 MED ORDER — LIDOCAINE HCL 1 % IJ SOLN
1.5000 mL | INTRAMUSCULAR | Status: AC | PRN
  Administered 2019-10-16: 1.5 mL

## 2019-10-16 NOTE — Patient Instructions (Addendum)
Recommend Shingrix vaccine (dead shingles vaccine)    Standing Labs We placed an order today for your standing lab work.    Please come back and get your standing labs in February and every 3 months   We have open lab daily Monday through Thursday from 8:30-12:30 PM and 1:30-4:30 PM and Friday from 8:30-12:30 PM and 1:30-4:00 PM at the office of Dr. Bo Merino.   You may experience shorter wait times on Monday and Friday afternoons. The office is located at [address], [place], [place], [place] [address] No appointment is necessary.   Labs are drawn by [facility].  You may receive a bill from [facility] for your lab work.  If you wish to have your labs drawn at another location, please call the office 24 hours in advance to send orders.  If you have any questions regarding directions or hours of operation,  please call [phone].   Just as a reminder please drink plenty of water prior to coming for your lab work. Thanks!

## 2019-10-24 ENCOUNTER — Other Ambulatory Visit: Payer: Self-pay

## 2019-10-24 ENCOUNTER — Ambulatory Visit: Payer: Medicare Other | Attending: Physician Assistant | Admitting: Physical Therapy

## 2019-10-24 ENCOUNTER — Encounter: Payer: Self-pay | Admitting: Physical Therapy

## 2019-10-24 DIAGNOSIS — M545 Low back pain: Secondary | ICD-10-CM | POA: Insufficient documentation

## 2019-10-24 DIAGNOSIS — M25561 Pain in right knee: Secondary | ICD-10-CM | POA: Diagnosis not present

## 2019-10-24 DIAGNOSIS — G8929 Other chronic pain: Secondary | ICD-10-CM

## 2019-10-24 DIAGNOSIS — R262 Difficulty in walking, not elsewhere classified: Secondary | ICD-10-CM | POA: Insufficient documentation

## 2019-10-24 NOTE — Therapy (Signed)
[facility] [place], [place], [address] Phone: [phone]   Fax:  [phone]  Physical Therapy Evaluation  Patient Details  Name: Jean Howell MRN: [id number] Date of Birth: Jan 16, 1956 Referring Provider (PT): Hazel Sams, PA-C   Encounter Date: 10/24/2019  PT End of Session - 10/24/19 1906    Visit Number  1    Number of Visits  12    Date for PT Re-Evaluation  12/12/19    Authorization Type  Progress note at 10th visit    PT Start Time  1115    PT Stop Time  1201    PT Time Calculation (min)  46 min    Activity Tolerance  Patient tolerated treatment well;Patient limited by pain    Behavior During Therapy  [facility] for tasks assessed/performed       Past Medical History:  Diagnosis Date  . Arthritis   . Depression   . Hypertension   . Rheumatoid arthritis [facility])     Past Surgical History:  Procedure Laterality Date  . BREAST SURGERY     nodules removed  . COLONOSCOPY N/A 06/25/2019   Procedure: COLONOSCOPY;  Surgeon: Danie Binder, MD;  [facility];  Service: Endoscopy;  Laterality: N/A;  1:30  . POLYPECTOMY  06/25/2019   Procedure: POLYPECTOMY;  Surgeon: Danie Binder, MD;  [facility];  Service: Endoscopy;;    There were no vitals filed for this visit.   Subjective Assessment - 10/24/19 1230    Subjective  COVID-19 screening performed upon arrival.Patient arrives to physical therapy with reports of bilateral knee pain, R>L, and low back pain that progressively worsened in the past year. Patient reported being diagnosed with Rheumatoid Arthritis to which worsened her pain. Patient reports a sedentary lifestyle and mainly sits at home due to pain throughout with activity. Patient reports difficulties with walking, transfers, and requires modifications to perform dressing and showering activities. Patient's pain at worst is 8/10 and pain at best is mild to moderate and states pain  fluctuates depending on activity. Patient's goals are to decrease pain, improve movement, improve strength, and improve standing tolerance, and improve ability to perform home activities.    Pertinent History  RA, HTN, Depression, arthritis    Limitations  Lifting;Standing;Walking;House hold activities    How long can you sit comfortably?  unlimited    How long can you stand comfortably?  short time    How long can you walk comfortably?  room to room comfortably    Diagnostic tests  X-Ray, see imaging    Patient Stated Goals  decrease pain, improve ability to perform ADLs.    Currently in Pain?  Yes    Pain Score  8     Pain Location  Back    Pain Descriptors / Indicators  Sharp    Pain Type  Chronic pain    Pain Onset  More than a month ago    Pain Frequency  Constant    Aggravating Factors   walking, standing or moving    Pain Relieving Factors  sitting    Effect of Pain on Daily Activities  "cant walk without pain"    Multiple Pain Sites  Yes    Pain Score  8    Pain Location  Knee    Pain Orientation  Right;Left    Pain Descriptors / Indicators  Sharp    Pain Type  Chronic pain    Pain Onset  More than  a month ago    Pain Frequency  Constant         [facility] PT Assessment - 10/24/19 0001      Assessment   Medical Diagnosis  Rheumatoid arthritis involving multiple sites with positive rheumatoid factor, Primary osteoarthritis of both knees, chronic pain of right knee    Referring Provider (PT)  Hazel Sams, PA-C    Onset Date/Surgical Date  --   about a year ago   Next MD Visit  February 2021    Prior Therapy  no      Precautions   Precautions  None      Restrictions   Weight Bearing Restrictions  No      Balance Screen   Has the patient fallen in the past 6 months  No    Has the patient had a decrease in activity level because of a fear of falling?   Yes    Is the patient reluctant to leave their home because of a fear of falling?   No      Home Environment    Living Environment  Private residence    Living Arrangements  Alone    Type of [place] Access  Stairs to enter    Entrance Stairs-Number of Steps  5    Entrance Stairs-Rails  Right    Home Layout  One level      Prior Function   Level of Independence  Independent with basic ADLs      ROM / Strength   AROM / PROM / Strength  Strength      Strength   Strength Assessment Site  Hip;Knee    Right/Left Hip  Right;Left    Right Hip Flexion  3+/5    Right Hip Extension  3+/5    Right Hip ABduction  3+/5    Left Hip Flexion  3+/5    Left Hip Extension  3+/5    Left Hip ABduction  3+/5    Right/Left Knee  Right;Left    Right Knee Flexion  4-/5    Right Knee Extension  4/5    Left Knee Flexion  4-/5    Left Knee Extension  4/5      Palpation   Palpation comment  very tender to palpation to bilateral lumbar paraspinals and QLs and upper gluteals. increased tenderness to right knee      Transfers   Comments  able to perform but with increased time and reports of pain      Ambulation/Gait   Gait Pattern  Step-to pattern;Step-through pattern;Decreased stride length;Decreased step length - right;Decreased step length - left;Shuffle;Antalgic;Decreased stance time - right;Decreased weight shift to right;Trunk flexed                Objective measurements completed on examination: See above findings.              PT Education - 10/24/19 1300    Education Details  draw ins, hip abduction, hip adduction, scapular retractions    Person(s) Educated  Patient    Methods  Explanation;Handout;Demonstration    Comprehension  Verbalized understanding;Returned demonstration          PT Long Term Goals - 10/24/19 1910      PT LONG TERM GOAL #1   Title  Patient will be independent with HEP    Time  6    Period  Weeks    Status  New  PT LONG TERM GOAL #2   Title  Patient will report ability to perform ADLs with low back and bilateral knee pain less than  or equal to 4/10    Time  6    Period  Weeks    Status  New      PT LONG TERM GOAL #3   Title  Patient will report ability to walk for 10 minutes or greater with low back pain and bilateral knee pain less than or equal to 4/10.    Time  6    Period  Weeks    Status  New             Plan - 10/24/19 2004    Clinical Impression Statement  Patient is a 63 year old female who presents to physical therapy with chronic low back pain and bilateral knee pain R>L that has progressively worsened over the past year. Patient is able to transfer independently but with increased time and forearm support and momentum as wrist pain causes difficulty with UE weight bearing. Patient noted with decreased bilateral LE MMT with reports of slight increase of pain with resisted R knee MMT in both planes. Patient ambulates with decreased stride length, decreased right stance time, decreased gait speed and a flexed trunk. Patient noted with more antalgic gait at end of evaluation and patient and PT discussed possibly an AD for safety when pain levels become high. Patient would benefit from skilled physical therapy to address deficits and address patient's goals.    Personal Factors and Comorbidities  Comorbidity 1;Time since onset of injury/illness/exacerbation    Comorbidities  RA, HTN, Depression    Examination-Activity Limitations  Bathing;Stand;Stairs;Hygiene/Grooming;Dressing;Locomotion Level;Transfers    Examination-Participation Restrictions  Community Activity    Stability/Clinical Decision Making  Stable/Uncomplicated    Clinical Decision Making  Low    Rehab Potential  Fair    PT Frequency  2x / week    PT Duration  6 weeks    PT Treatment/Interventions  ADLs/Self Care Home Management;Cryotherapy;Electrical Stimulation;Moist Heat;Ultrasound;Gait training;Stair training;Functional mobility training;Therapeutic activities;Therapeutic exercise;Balance training;Neuromuscular re-education;Patient/family  education;Manual techniques;Passive range of motion;Vasopneumatic Device    PT Next Visit Plan  Nustep, gentle LE strengthening per tolerance, progress to standing when ready,  modalities for pain relief.    PT Home Exercise Plan  see patent education section    Consulted and Agree with Plan of Care  Patient       Patient will benefit from skilled therapeutic intervention in order to improve the following deficits and impairments:  Decreased activity tolerance, Decreased strength, Difficulty walking, Decreased range of motion, Pain, Postural dysfunction, Decreased balance  Visit Diagnosis: Chronic pain of right knee - Plan: PT plan of care cert/re-cert  Chronic bilateral low back pain without sciatica - Plan: PT plan of care cert/re-cert  Difficulty in walking, not elsewhere classified - Plan: PT plan of care cert/re-cert     Problem List Patient Active Problem List   Diagnosis Date Noted  . Special screening for malignant neoplasms, colon   . Rheumatoid arthritis involving multiple sites with positive rheumatoid factor ([place]) 03/06/2019  . Primary osteoarthritis of both knees 03/06/2019  . Former smoker 03/06/2019  . ANA positive 02/12/2019  . Family history of thyroid disease 01/08/2019  . Morbid obesity ([place]) 01/08/2019  . Essential hypertension 01/08/2019  . Polyarthralgia 01/08/2019  . Family history of colon cancer 01/08/2019    Gabriela Eves, PT, DPT 10/24/2019, 8:12 PM  [facility] [address]  [place], [place], [address] Phone: [phone]   Fax:  [phone]  Name: Jean Howell MRN: [id number] Date of Birth: 02/16/1956

## 2019-10-26 ENCOUNTER — Ambulatory Visit: Payer: Medicare Other | Admitting: Physical Therapy

## 2019-10-26 ENCOUNTER — Other Ambulatory Visit: Payer: Self-pay

## 2019-10-26 ENCOUNTER — Encounter: Payer: Self-pay | Admitting: Physical Therapy

## 2019-10-26 DIAGNOSIS — G8929 Other chronic pain: Secondary | ICD-10-CM | POA: Diagnosis not present

## 2019-10-26 DIAGNOSIS — M545 Low back pain: Secondary | ICD-10-CM | POA: Diagnosis not present

## 2019-10-26 DIAGNOSIS — R262 Difficulty in walking, not elsewhere classified: Secondary | ICD-10-CM

## 2019-10-26 DIAGNOSIS — M25561 Pain in right knee: Secondary | ICD-10-CM | POA: Diagnosis not present

## 2019-10-26 NOTE — Therapy (Signed)
[facility] [place], [place], [address] Phone: [phone]   Fax:  [phone]  Physical Therapy Treatment  Patient Details  Name: Jean Howell MRN: [id number] Date of Birth: 03-11-1956 Referring Provider (PT): Hazel Sams, PA-C   Encounter Date: 10/26/2019  PT End of Session - 10/26/19 1229    Visit Number  2    Number of Visits  12    Date for PT Re-Evaluation  12/12/19    Authorization Type  Progress note at 10th visit    PT Start Time  1030    PT Stop Time  1126    PT Time Calculation (min)  56 min    Activity Tolerance  Patient tolerated treatment well;Patient limited by pain    Behavior During Therapy  [facility] for tasks assessed/performed       Past Medical History:  Diagnosis Date  . Arthritis   . Depression   . Hypertension   . Rheumatoid arthritis [facility])     Past Surgical History:  Procedure Laterality Date  . BREAST SURGERY     nodules removed  . COLONOSCOPY N/A 06/25/2019   Procedure: COLONOSCOPY;  Surgeon: Danie Binder, MD;  [facility];  Service: Endoscopy;  Laterality: N/A;  1:30  . POLYPECTOMY  06/25/2019   Procedure: POLYPECTOMY;  Surgeon: Danie Binder, MD;  [facility];  Service: Endoscopy;;    There were no vitals filed for this visit.  Subjective Assessment - 10/26/19 1050    Subjective  COVID-19 screening performed upon arrival. Patient arrives feeling "alright" and has been compliant with HEP provided.    Pertinent History  RA, HTN, Depression, arthritis    Limitations  Lifting;Standing;Walking;House hold activities    How long can you sit comfortably?  unlimited    How long can you stand comfortably?  short time    How long can you walk comfortably?  room to room comfortably    Diagnostic tests  X-Ray, see imaging    Patient Stated Goals  decrease pain, improve ability to perform ADLs.    Currently in Pain?  Yes    Pain Score  7     Pain Location  Back    Pain Orientation  Lower    Pain Descriptors / Indicators  Discomfort    Pain Type  Chronic pain    Pain Score  7    Pain Location  Knee    Pain Orientation  Right;Left    Pain Descriptors / Indicators  Discomfort         [facility] PT Assessment - 10/26/19 0001      Assessment   Medical Diagnosis  Rheumatoid arthritis involving multiple sites with positive rheumatoid factor, Primary osteoarthritis of both knees, chronic pain of right knee    Referring Provider (PT)  Hazel Sams, PA-C    Next MD Visit  February 2021    Prior Therapy  no      Precautions   Precautions  None                   [facility] Adult PT Treatment/Exercise - 10/26/19 0001      Exercises   Exercises  Lumbar;Knee/Hip      Lumbar Exercises: Stretches   Passive Hamstring Stretch  Right;Left;3 reps;20 seconds      Lumbar Exercises: Aerobic   Nustep  Level 2 x10 minutes      Lumbar Exercises: Seated   Other Seated Lumbar Exercises  ab  set x20 3" hold    Other Seated Lumbar Exercises  scapular retractions x20       Knee/Hip Exercises: Seated   Long Arc Quad  AROM;Both;2 sets;10 reps    Clamshell with TheraBand  Yellow    Marching  AROM;Both;2 sets;10 reps    Hamstring Curl  AROM;Both;2 sets;10 reps    Hamstring Limitations  yellow theraband      Modalities   Modalities  Electrical Stimulation;Moist Heat      Moist Heat Therapy   Number Minutes Moist Heat  10 Minutes    Moist Heat Location  Lumbar Spine      Electrical Stimulation   Electrical Stimulation Location  bilateral low back and right knee    Electrical Stimulation Action  pre-mod, 2 channels    Electrical Stimulation Parameters  80-150 hz x10 mins    Electrical Stimulation Goals  Pain                  PT Long Term Goals - 10/24/19 1910      PT LONG TERM GOAL #1   Title  Patient will be independent with HEP    Time  6    Period  Weeks    Status  New      PT LONG TERM GOAL #2   Title  Patient will report ability  to perform ADLs with low back and bilateral knee pain less than or equal to 4/10    Time  6    Period  Weeks    Status  New      PT LONG TERM GOAL #3   Title  Patient will report ability to walk for 10 minutes or greater with low back pain and bilateral knee pain less than or equal to 4/10.    Time  6    Period  Weeks    Status  New            Plan - 10/26/19 1225    Clinical Impression Statement  Patient responded fairly well to first treatment session despite ongoing low back and right knee pain. Patient guided through gentle seated exercises to address ROM and strength. Patient provided with short rest breaks throughout and overall was able to complete with minimal reports of pain. No adverse affects to modalities upon removal of modalities.    Personal Factors and Comorbidities  Comorbidity 1;Time since onset of injury/illness/exacerbation    Comorbidities  RA, HTN, Depression    Examination-Activity Limitations  Bathing;Stand;Stairs;Hygiene/Grooming;Dressing;Locomotion Level;Transfers    Examination-Participation Restrictions  Community Activity    Stability/Clinical Decision Making  Stable/Uncomplicated    Clinical Decision Making  Low    Rehab Potential  Fair    PT Frequency  2x / week    PT Duration  6 weeks    PT Treatment/Interventions  ADLs/Self Care Home Management;Cryotherapy;Electrical Stimulation;Moist Heat;Ultrasound;Gait training;Stair training;Functional mobility training;Therapeutic activities;Therapeutic exercise;Balance training;Neuromuscular re-education;Patient/family education;Manual techniques;Passive range of motion;Vasopneumatic Device    PT Next Visit Plan  Nustep, gentle LE strengthening per tolerance, progress to standing when ready,  modalities for pain relief.    PT Home Exercise Plan  see patent education section    Consulted and Agree with Plan of Care  Patient       Patient will benefit from skilled therapeutic intervention in order to improve  the following deficits and impairments:  Decreased activity tolerance, Decreased strength, Difficulty walking, Decreased range of motion, Pain, Postural dysfunction, Decreased balance  Visit Diagnosis: Chronic pain of right  knee  Chronic bilateral low back pain without sciatica  Difficulty in walking, not elsewhere classified     Problem List Patient Active Problem List   Diagnosis Date Noted  . Special screening for malignant neoplasms, colon   . Rheumatoid arthritis involving multiple sites with positive rheumatoid factor ([place]) 03/06/2019  . Primary osteoarthritis of both knees 03/06/2019  . Former smoker 03/06/2019  . ANA positive 02/12/2019  . Family history of thyroid disease 01/08/2019  . Morbid obesity ([place]) 01/08/2019  . Essential hypertension 01/08/2019  . Polyarthralgia 01/08/2019  . Family history of colon cancer 01/08/2019    Gabriela Eves , PT, DPT 10/26/2019, 12:31 PM  [facility] [address] [place], [place], [address] Phone: [phone]   Fax:  [phone]  Name: Shunteria Bish MRN: [id number] Date of Birth: 1956/10/17

## 2019-10-31 ENCOUNTER — Ambulatory Visit: Payer: Medicare Other | Admitting: Physical Therapy

## 2019-10-31 ENCOUNTER — Other Ambulatory Visit: Payer: Self-pay

## 2019-10-31 DIAGNOSIS — G8929 Other chronic pain: Secondary | ICD-10-CM

## 2019-10-31 DIAGNOSIS — M25561 Pain in right knee: Secondary | ICD-10-CM | POA: Diagnosis not present

## 2019-10-31 DIAGNOSIS — R262 Difficulty in walking, not elsewhere classified: Secondary | ICD-10-CM

## 2019-10-31 DIAGNOSIS — M545 Low back pain: Secondary | ICD-10-CM | POA: Diagnosis not present

## 2019-10-31 NOTE — Therapy (Signed)
[facility] [place], [place], [address] Phone: [phone]   Fax:  [phone]  Physical Therapy Treatment  Patient Details  Name: Jean Howell MRN: [id number] Date of Birth: Dec 06, 1956 Referring Provider (PT): Hazel Sams, PA-C   Encounter Date: 10/31/2019  PT End of Session - 10/31/19 1125    Visit Number  3    Number of Visits  12    Date for PT Re-Evaluation  12/12/19    Authorization Type  Progress note at 10th visit    PT Start Time  1030    PT Stop Time  1120    PT Time Calculation (min)  50 min    Activity Tolerance  Patient tolerated treatment well;Patient limited by pain    Behavior During Therapy  [facility] for tasks assessed/performed       Past Medical History:  Diagnosis Date  . Arthritis   . Depression   . Hypertension   . Rheumatoid arthritis [facility])     Past Surgical History:  Procedure Laterality Date  . BREAST SURGERY     nodules removed  . COLONOSCOPY N/A 06/25/2019   Procedure: COLONOSCOPY;  Surgeon: Danie Binder, MD;  [facility];  Service: Endoscopy;  Laterality: N/A;  1:30  . POLYPECTOMY  06/25/2019   Procedure: POLYPECTOMY;  Surgeon: Danie Binder, MD;  [facility];  Service: Endoscopy;;    There were no vitals filed for this visit.  Subjective Assessment - 10/31/19 1037    Subjective  COVID-19 screening performed upon arrival. Patient arrives feeling better. 5/10 pain    Pertinent History  RA, HTN, Depression, arthritis    Limitations  Lifting;Standing;Walking;House hold activities    How long can you sit comfortably?  unlimited    How long can you stand comfortably?  short time    How long can you walk comfortably?  room to room comfortably    Diagnostic tests  X-Ray, see imaging    Patient Stated Goals  decrease pain, improve ability to perform ADLs.    Currently in Pain?  Yes    Pain Score  5     Pain Location  Back    Pain Orientation  Lower    Pain  Descriptors / Indicators  Discomfort    Pain Type  Chronic pain    Pain Onset  More than a month ago    Pain Score  5    Pain Location  Back    Pain Orientation  Right;Left    Pain Descriptors / Indicators  Discomfort    Pain Type  Chronic pain    Pain Onset  More than a month ago    Pain Frequency  Constant         [facility] PT Assessment - 10/31/19 0001      Assessment   Medical Diagnosis  Rheumatoid arthritis involving multiple sites with positive rheumatoid factor, Primary osteoarthritis of both knees, chronic pain of right knee    Referring Provider (PT)  Hazel Sams, PA-C    Next MD Visit  February 2021    Prior Therapy  no      Precautions   Precautions  None                   [facility] Adult PT Treatment/Exercise - 10/31/19 0001      Exercises   Exercises  Lumbar;Knee/Hip      Lumbar Exercises: Aerobic   Nustep  Level 2 x10 minutes  Lumbar Exercises: Seated   Other Seated Lumbar Exercises  lumbar extension x20 followed by thoracic rotation x20      Knee/Hip Exercises: Seated   Clamshell with TheraBand  Yellow   x20   Marching  AROM;Both;2 sets;10 reps      Modalities   Modalities  Electrical Stimulation;Moist Research officer, political party Location  bilateral low back and right knee    Electrical Stimulation Action  pre-mod 2 channels    Electrical Stimulation Parameters  80-150 hz x10 mins    Electrical Stimulation Goals  Pain                  PT Long Term Goals - 10/24/19 1910      PT LONG TERM GOAL #1   Title  Patient will be independent with HEP    Time  6    Period  Weeks    Status  New      PT LONG TERM GOAL #2   Title  Patient will report ability to perform ADLs with low back and bilateral knee pain less than or equal to 4/10    Time  6    Period  Weeks    Status  New      PT LONG TERM GOAL #3   Title  Patient will report ability to walk for 10 minutes or greater with low back pain and  bilateral knee pain less than or equal to 4/10.    Time  6    Period  Weeks    Status  New            Plan - 10/31/19 1102    Clinical Impression Statement  Patient responded fairly well but did report slight increase of pain and heaviness with left LE marching. Patient attempted lumbar extension with shoulder AROM but pain occured in left shoulder therefore exercise terminated and seated lumbar extension performed with no complaints. No MHP provided due to machine being out of order; no adverse affects to e-stim at end of session.    Personal Factors and Comorbidities  Comorbidity 1;Time since onset of injury/illness/exacerbation    Comorbidities  RA, HTN, Depression    Examination-Activity Limitations  Bathing;Stand;Stairs;Hygiene/Grooming;Dressing;Locomotion Level;Transfers    Examination-Participation Restrictions  Community Activity    Stability/Clinical Decision Making  Stable/Uncomplicated    Clinical Decision Making  Low    Rehab Potential  Fair    PT Frequency  2x / week    PT Duration  6 weeks    PT Treatment/Interventions  ADLs/Self Care Home Management;Cryotherapy;Electrical Stimulation;Moist Heat;Ultrasound;Gait training;Stair training;Functional mobility training;Therapeutic activities;Therapeutic exercise;Balance training;Neuromuscular re-education;Patient/family education;Manual techniques;Passive range of motion;Vasopneumatic Device    PT Next Visit Plan  Nustep, gentle LE strengthening per tolerance, progress to standing when ready,  modalities for pain relief.    PT Home Exercise Plan  see patent education section    Consulted and Agree with Plan of Care  Patient       Patient will benefit from skilled therapeutic intervention in order to improve the following deficits and impairments:  Decreased activity tolerance, Decreased strength, Difficulty walking, Decreased range of motion, Pain, Postural dysfunction, Decreased balance  Visit Diagnosis: Chronic pain of  right knee  Chronic bilateral low back pain without sciatica  Difficulty in walking, not elsewhere classified     Problem List Patient Active Problem List   Diagnosis Date Noted  . Special screening for malignant neoplasms, colon   . Rheumatoid  arthritis involving multiple sites with positive rheumatoid factor ([place]) 03/06/2019  . Primary osteoarthritis of both knees 03/06/2019  . Former smoker 03/06/2019  . ANA positive 02/12/2019  . Family history of thyroid disease 01/08/2019  . Morbid obesity ([place]) 01/08/2019  . Essential hypertension 01/08/2019  . Polyarthralgia 01/08/2019  . Family history of colon cancer 01/08/2019    Gabriela Eves, PT, DPT  10/31/2019, 11:28 AM  [facility] [place], [place], [address] Phone: [phone]   Fax:  [phone]  Name: Jean Howell MRN: [id number] Date of Birth: 1956-10-28

## 2019-11-02 ENCOUNTER — Other Ambulatory Visit: Payer: Self-pay | Admitting: Rheumatology

## 2019-11-05 NOTE — Telephone Encounter (Signed)
Notes recorded by Ofilia Neas, PA-C on 10/15/2019 at 8:07 AM EST  Creatinine elevated but stable. GFR is stable. CBC WNL  Last RF 09/07/2019 Last appt 10/16/2019 Next appt 01/21/2020

## 2019-11-05 NOTE — Telephone Encounter (Signed)
Ok to refill 

## 2019-11-07 ENCOUNTER — Ambulatory Visit: Payer: Medicare Other | Attending: Physician Assistant | Admitting: Physical Therapy

## 2019-11-07 ENCOUNTER — Other Ambulatory Visit: Payer: Self-pay

## 2019-11-07 DIAGNOSIS — M545 Low back pain: Secondary | ICD-10-CM | POA: Insufficient documentation

## 2019-11-07 DIAGNOSIS — M25561 Pain in right knee: Secondary | ICD-10-CM | POA: Diagnosis not present

## 2019-11-07 DIAGNOSIS — G8929 Other chronic pain: Secondary | ICD-10-CM | POA: Insufficient documentation

## 2019-11-07 DIAGNOSIS — R262 Difficulty in walking, not elsewhere classified: Secondary | ICD-10-CM | POA: Insufficient documentation

## 2019-11-07 NOTE — Therapy (Signed)
[facility] [place], [place], [address] Phone: [phone]   Fax:  [phone]  Physical Therapy Treatment  Patient Details  Name: Jean Howell MRN: [id number] Date of Birth: [birth year] Referring Provider (PT): Hazel Sams, PA-C   Encounter Date: 11/07/2019  PT End of Session - 11/07/19 1125    Visit Number  4    Number of Visits  12    Date for PT Re-Evaluation  12/12/19    Authorization Type  Progress note at 10th visit    PT Start Time  1030    PT Stop Time  1122    PT Time Calculation (min)  52 min    Activity Tolerance  Patient tolerated treatment well    Behavior During Therapy  [facility] for tasks assessed/performed       Past Medical History:  Diagnosis Date  . Arthritis   . Depression   . Hypertension   . Rheumatoid arthritis [facility])     Past Surgical History:  Procedure Laterality Date  . BREAST SURGERY     nodules removed  . COLONOSCOPY N/A 06/25/2019   Procedure: COLONOSCOPY;  Surgeon: Danie Binder, MD;  [facility];  Service: Endoscopy;  Laterality: N/A;  1:30  . POLYPECTOMY  06/25/2019   Procedure: POLYPECTOMY;  Surgeon: Danie Binder, MD;  [facility];  Service: Endoscopy;;    There were no vitals filed for this visit.  Subjective Assessment - 11/07/19 1128    Subjective  COVID-19 screening performed upon arrival. Patient arrives stating her back is feeling much better and she is able to walk with less pain.    Pertinent History  RA, HTN, Depression, arthritis    Limitations  Lifting;Standing;Walking;House hold activities    How long can you sit comfortably?  unlimited    How long can you stand comfortably?  short time    How long can you walk comfortably?  room to room comfortably    Diagnostic tests  X-Ray, see imaging    Patient Stated Goals  decrease pain, improve ability to perform ADLs.    Currently in Pain?  Yes    Pain Score  0-No pain    Pain Location  Back     Pain Orientation  Lower    Pain Descriptors / Indicators  Discomfort    Pain Type  Chronic pain    Pain Onset  More than a month ago    Pain Frequency  Constant    Pain Score  --   did not provide number on pain scale   Pain Location  Knee    Pain Orientation  Right    Pain Descriptors / Indicators  Discomfort    Pain Type  Chronic pain         [facility] PT Assessment - 11/07/19 0001      Assessment   Medical Diagnosis  Rheumatoid arthritis involving multiple sites with positive rheumatoid factor, Primary osteoarthritis of both knees, chronic pain of right knee    Referring Provider (PT)  Hazel Sams, PA-C    Next MD Visit  February 2021    Prior Therapy  no                   [facility] Adult PT Treatment/Exercise - 11/07/19 0001      Lumbar Exercises: Aerobic   Nustep  Level 4 x12 minutes      Lumbar Exercises: Seated   Other Seated Lumbar Exercises  scapular  retractions x20       Knee/Hip Exercises: Seated   Long Arc Quad  AROM;Both;2 sets;10 reps    Clamshell with TheraBand  Red   x20   Hamstring Curl  AROM;Both;2 sets;10 reps    Hamstring Limitations  red theraband    Sit to [employer]  10 reps;with UE support      Modalities   Modalities  Electrical Stimulation;Moist Heat      Moist Heat Therapy   Number Minutes Moist Heat  10 Minutes    Moist Heat Location  Lumbar Spine      Electrical Stimulation   Electrical Stimulation Location  bilateral low back and right knee    Electrical Stimulation Action  pre-mod 2 channels    Electrical Stimulation Parameters  80-150 hz x10 mins    Electrical Stimulation Goals  Pain                  PT Long Term Goals - 10/24/19 1910      PT LONG TERM GOAL #1   Title  Patient will be independent with HEP    Time  6    Period  Weeks    Status  New      PT LONG TERM GOAL #2   Title  Patient will report ability to perform ADLs with low back and bilateral knee pain less than or equal to 4/10    Time  6     Period  Weeks    Status  New      PT LONG TERM GOAL #3   Title  Patient will report ability to walk for 10 minutes or greater with low back pain and bilateral knee pain less than or equal to 4/10.    Time  6    Period  Weeks    Status  New            Plan - 11/07/19 1125    Clinical Impression Statement  Patient responded fairly well to exercise but still with intermittent rest breaks. Patient was able to complete all TEs with good form and technique. Patient noted with the use of her posterior legs to control descent to sit. Patient and PT discussed improving eccentric control of LEs throughout therapy session. No adverse affects upon removal of modalities.    Personal Factors and Comorbidities  Comorbidity 1;Time since onset of injury/illness/exacerbation    Comorbidities  RA, HTN, Depression    Examination-Activity Limitations  Bathing;Stand;Stairs;Hygiene/Grooming;Dressing;Locomotion Level;Transfers    Examination-Participation Restrictions  Community Activity    Stability/Clinical Decision Making  Stable/Uncomplicated    Clinical Decision Making  Low    Rehab Potential  Fair    PT Frequency  2x / week    PT Duration  6 weeks    PT Treatment/Interventions  ADLs/Self Care Home Management;Cryotherapy;Electrical Stimulation;Moist Heat;Ultrasound;Gait training;Stair training;Functional mobility training;Therapeutic activities;Therapeutic exercise;Balance training;Neuromuscular re-education;Patient/family education;Manual techniques;Passive range of motion;Vasopneumatic Device    PT Next Visit Plan  Nustep, gentle LE strengthening per tolerance, progress to standing when ready,  modalities for pain relief.    PT Home Exercise Plan  see patent education section    Consulted and Agree with Plan of Care  Patient       Patient will benefit from skilled therapeutic intervention in order to improve the following deficits and impairments:  Decreased activity tolerance, Decreased strength,  Difficulty walking, Decreased range of motion, Pain, Postural dysfunction, Decreased balance  Visit Diagnosis: Chronic pain of right knee  Chronic bilateral low  back pain without sciatica  Difficulty in walking, not elsewhere classified     Problem List Patient Active Problem List   Diagnosis Date Noted  . Special screening for malignant neoplasms, colon   . Rheumatoid arthritis involving multiple sites with positive rheumatoid factor ([place]) 03/06/2019  . Primary osteoarthritis of both knees 03/06/2019  . Former smoker 03/06/2019  . ANA positive 02/12/2019  . Family history of thyroid disease 01/08/2019  . Morbid obesity ([place]) 01/08/2019  . Essential hypertension 01/08/2019  . Polyarthralgia 01/08/2019  . Family history of colon cancer 01/08/2019    Gabriela Eves, PT, DPT 11/07/2019, 12:16 PM  [facility] [address] [place], [place], [address] Phone: [phone]   Fax:  [phone]  Name: Jean Howell MRN: [id number] Date of Birth: 26-Feb-1956

## 2019-11-12 ENCOUNTER — Ambulatory Visit: Payer: PRIVATE HEALTH INSURANCE | Admitting: Rheumatology

## 2019-11-14 ENCOUNTER — Ambulatory Visit: Payer: Medicare Other | Admitting: Physical Therapy

## 2019-11-14 ENCOUNTER — Other Ambulatory Visit: Payer: Self-pay

## 2019-11-14 ENCOUNTER — Other Ambulatory Visit: Payer: Self-pay | Admitting: Family Medicine

## 2019-11-14 ENCOUNTER — Encounter: Payer: Self-pay | Admitting: Physical Therapy

## 2019-11-14 DIAGNOSIS — M545 Low back pain, unspecified: Secondary | ICD-10-CM

## 2019-11-14 DIAGNOSIS — M25561 Pain in right knee: Secondary | ICD-10-CM

## 2019-11-14 DIAGNOSIS — R262 Difficulty in walking, not elsewhere classified: Secondary | ICD-10-CM

## 2019-11-14 DIAGNOSIS — M2550 Pain in unspecified joint: Secondary | ICD-10-CM

## 2019-11-14 DIAGNOSIS — G8929 Other chronic pain: Secondary | ICD-10-CM

## 2019-11-14 NOTE — Therapy (Signed)
[facility] [place], [place], [address] Phone: [phone]   Fax:  [phone]  Physical Therapy Treatment  Patient Details  Name: Jean Howell MRN: [id number] Date of Birth: [birth year] Referring Provider (PT): Hazel Sams, PA-C   Encounter Date: 11/14/2019  PT End of Session - 11/14/19 1044    Visit Number  5    Number of Visits  12    Date for PT Re-Evaluation  12/12/19    Authorization Type  Progress note at 10th visit    PT Start Time  1030    PT Stop Time  1122    PT Time Calculation (min)  52 min    Activity Tolerance  Patient tolerated treatment well    Behavior During Therapy  [facility] for tasks assessed/performed       Past Medical History:  Diagnosis Date  . Arthritis   . Depression   . Hypertension   . Rheumatoid arthritis [facility])     Past Surgical History:  Procedure Laterality Date  . BREAST SURGERY     nodules removed  . COLONOSCOPY N/A 06/25/2019   Procedure: COLONOSCOPY;  Surgeon: Danie Binder, MD;  [facility];  Service: Endoscopy;  Laterality: N/A;  1:30  . POLYPECTOMY  06/25/2019   Procedure: POLYPECTOMY;  Surgeon: Danie Binder, MD;  [facility];  Service: Endoscopy;;    There were no vitals filed for this visit.  Subjective Assessment - 11/14/19 1036    Subjective  COVID-19 screening performed upon arrival. Patient reports feeling good today and reports improvement in function since the start of therapy.    Pertinent History  RA, HTN, Depression, arthritis    Limitations  Lifting;Standing;Walking;House hold activities    How long can you sit comfortably?  unlimited    How long can you stand comfortably?  short time    How long can you walk comfortably?  room to room comfortably    Diagnostic tests  X-Ray, see imaging    Patient Stated Goals  decrease pain, improve ability to perform ADLs.    Currently in Pain?  Yes    Pain Score  4     Pain Location  Back    Pain Orientation  Lower    Pain Descriptors / Indicators  Discomfort    Pain Type  Chronic pain    Pain Onset  More than a month ago    Pain Frequency  Constant    Pain Score  4    Pain Location  Knee    Pain Orientation  Right    Pain Descriptors / Indicators  Discomfort    Pain Type  Chronic pain    Pain Onset  More than a month ago    Pain Frequency  Constant         [facility] PT Assessment - 11/14/19 0001      Assessment   Medical Diagnosis  Rheumatoid arthritis involving multiple sites with positive rheumatoid factor, Primary osteoarthritis of both knees, chronic pain of right knee    Referring Provider (PT)  Hazel Sams, PA-C    Next MD Visit  February 2021    Prior Therapy  no                   [facility] Adult PT Treatment/Exercise - 11/14/19 0001      Exercises   Exercises  Lumbar;Knee/Hip      Lumbar Exercises: Aerobic   Nustep  Level 4 x12  minutes      Lumbar Exercises: Seated   Other Seated Lumbar Exercises  lumbar extension x20 followed by thoracic rotation x20    Other Seated Lumbar Exercises  scapular retractions x20       Knee/Hip Exercises: Seated   Long Arc Quad  AROM;Both;2 sets;10 reps    Clamshell with TheraBand  Red   3x10   Sit to Sand  5 reps;without UE support   elevated plinth     Modalities   Modalities  Electrical Stimulation;Moist Heat      Moist Heat Therapy   Number Minutes Moist Heat  15 Minutes    Moist Heat Location  Lumbar Spine      Electrical Stimulation   Electrical Stimulation Location  bilateral low back and right knee    Electrical Stimulation Action  pre-mod 2 channels    Electrical Stimulation Parameters  80-150 hz x15 mins    Electrical Stimulation Goals  Pain                  PT Long Term Goals - 11/14/19 1045      PT LONG TERM GOAL #1   Title  Patient will be independent with HEP    Time  6    Period  Weeks    Status  Achieved      PT LONG TERM GOAL #2   Title  Patient will report ability  to perform ADLs with low back and bilateral knee pain less than or equal to 4/10    Period  Weeks    Status  On-going      PT LONG TERM GOAL #3   Title  Patient will report ability to walk for 10 minutes or greater with low back pain and bilateral knee pain less than or equal to 4/10.    Time  6    Period  Weeks    Status  On-going            Plan - 11/14/19 1050    Clinical Impression Statement  Patient tolerated treatment well and was able to demonstrate good form with all exercises. Patient guided through eccentric strengthening through sit to stands and reported pulling of the right knee. Patient instructed to practice at home on elevated surface like her bed. Patient reported understanding.  Normal response to modalities upon removal. Goals ongoing at this time.    Personal Factors and Comorbidities  Comorbidity 1;Time since onset of injury/illness/exacerbation    Comorbidities  RA, HTN, Depression    Examination-Activity Limitations  Bathing;Stand;Stairs;Hygiene/Grooming;Dressing;Locomotion Level;Transfers    Examination-Participation Restrictions  Community Activity    Stability/Clinical Decision Making  Stable/Uncomplicated    Clinical Decision Making  Low    Rehab Potential  Fair    PT Frequency  2x / week    PT Duration  6 weeks    PT Treatment/Interventions  ADLs/Self Care Home Management;Cryotherapy;Electrical Stimulation;Moist Heat;Ultrasound;Gait training;Stair training;Functional mobility training;Therapeutic activities;Therapeutic exercise;Balance training;Neuromuscular re-education;Patient/family education;Manual techniques;Passive range of motion;Vasopneumatic Device    PT Next Visit Plan  Nustep, gentle LE strengthening per tolerance, progress to standing when ready,  modalities for pain relief.    PT Home Exercise Plan  see patent education section    Consulted and Agree with Plan of Care  Patient       Patient will benefit from skilled therapeutic intervention  in order to improve the following deficits and impairments:  Decreased activity tolerance, Decreased strength, Difficulty walking, Decreased range of motion, Pain, Postural dysfunction, Decreased  balance  Visit Diagnosis: Chronic pain of right knee  Chronic bilateral low back pain without sciatica  Difficulty in walking, not elsewhere classified     Problem List Patient Active Problem List   Diagnosis Date Noted  . Special screening for malignant neoplasms, colon   . Rheumatoid arthritis involving multiple sites with positive rheumatoid factor ([place]) 03/06/2019  . Primary osteoarthritis of both knees 03/06/2019  . Former smoker 03/06/2019  . ANA positive 02/12/2019  . Family history of thyroid disease 01/08/2019  . Morbid obesity ([place]) 01/08/2019  . Essential hypertension 01/08/2019  . Polyarthralgia 01/08/2019  . Family history of colon cancer 01/08/2019    Gabriela Eves, PT, DPT 11/14/2019, 11:26 AM  [facility] [address] [place], [place], [address] Phone: [phone]   Fax:  [phone]  Name: Jean Howell MRN: [id number] Date of Birth: 03-05-1956

## 2019-11-21 ENCOUNTER — Other Ambulatory Visit: Payer: Self-pay

## 2019-11-21 ENCOUNTER — Ambulatory Visit: Payer: Medicare Other | Admitting: Physical Therapy

## 2019-11-21 DIAGNOSIS — G8929 Other chronic pain: Secondary | ICD-10-CM

## 2019-11-21 DIAGNOSIS — R262 Difficulty in walking, not elsewhere classified: Secondary | ICD-10-CM | POA: Diagnosis not present

## 2019-11-21 DIAGNOSIS — M545 Low back pain, unspecified: Secondary | ICD-10-CM

## 2019-11-21 DIAGNOSIS — M25561 Pain in right knee: Secondary | ICD-10-CM

## 2019-11-21 NOTE — Therapy (Addendum)
[facility] [place], [place], [address] Phone: [phone]   Fax:  [phone]  Physical Therapy Treatment PHYSICAL THERAPY DISCHARGE SUMMARY  Visits from Start of Care: 6  Current functional level related to goals / functional outcomes: See below   Remaining deficits: See goals   Education / Equipment: HEP Plan: Patient agrees to discharge.  Patient goals were partially met. Patient is being discharged due to not returning since the last visit.  ?????    Gabriela Eves, PT, DPT 01/24/20   Patient Details  Name: Jean Howell MRN: [id number] Date of Birth: 1956-06-16 Referring Provider (PT): Hazel Sams, PA-C   Encounter Date: 11/21/2019  PT End of Session - 11/21/19 1047    Visit Number  6    Number of Visits  12    Date for PT Re-Evaluation  12/12/19    Authorization Type  Progress note at 10th visit    PT Start Time  1032    PT Stop Time  1119    PT Time Calculation (min)  47 min    Activity Tolerance  Patient tolerated treatment well    Behavior During Therapy  [facility] for tasks assessed/performed       Past Medical History:  Diagnosis Date  . Arthritis   . Depression   . Hypertension   . Rheumatoid arthritis [facility])     Past Surgical History:  Procedure Laterality Date  . BREAST SURGERY     nodules removed  . COLONOSCOPY N/A 06/25/2019   Procedure: COLONOSCOPY;  Surgeon: Danie Binder, MD;  [facility];  Service: Endoscopy;  Laterality: N/A;  1:30  . POLYPECTOMY  06/25/2019   Procedure: POLYPECTOMY;  Surgeon: Danie Binder, MD;  [facility];  Service: Endoscopy;;    There were no vitals filed for this visit.  Subjective Assessment - 11/21/19 1043    Subjective  COVID-19 screening performed upon arrival. Patient reports having a bad day yesterday and is unsure why she was so sore but today she feels good.    Pertinent History  RA, HTN, Depression, arthritis     Limitations  Lifting;Standing;Walking;House hold activities    How long can you sit comfortably?  unlimited    How long can you stand comfortably?  short time    How long can you walk comfortably?  room to room comfortably    Diagnostic tests  X-Ray, see imaging    Patient Stated Goals  decrease pain, improve ability to perform ADLs.    Currently in Pain?  Other (Comment)   did not provide pain scale        [facility] PT Assessment - 11/21/19 0001      Assessment   Medical Diagnosis  Rheumatoid arthritis involving multiple sites with positive rheumatoid factor, Primary osteoarthritis of both knees, chronic pain of right knee    Referring Provider (PT)  Hazel Sams, PA-C    Next MD Visit  February 2021    Prior Therapy  no                   [facility] Adult PT Treatment/Exercise - 11/21/19 0001      Exercises   Exercises  Lumbar;Knee/Hip      Lumbar Exercises: Aerobic   Nustep  Level 4 x15 minutes      Lumbar Exercises: Seated   Other Seated Lumbar Exercises  lumbar extension x20       Knee/Hip Exercises: Standing   Heel  Raises  Both;10 reps    Hip Flexion  --    Hip Abduction  Both;10 reps      Knee/Hip Exercises: Seated   Long Arc Quad  AROM;Both;10 reps    Long Arc Quad Weight  2 lbs.    Clamshell with TheraBand  Red   x20     Modalities   Modalities  Electrical Stimulation;Moist Heat      Moist Heat Therapy   Number Minutes Moist Heat  15 Minutes    Moist Heat Location  Lumbar Spine      Electrical Stimulation   Electrical Stimulation Location  bilateral low back and right knee    Electrical Stimulation Action  pre-mod 2 channels    Electrical Stimulation Parameters  80-150 hz x15 mins    Electrical Stimulation Goals  Pain                  PT Long Term Goals - 11/14/19 1045      PT LONG TERM GOAL #1   Title  Patient will be independent with HEP    Time  6    Period  Weeks    Status  Achieved      PT LONG TERM GOAL #2   Title  Patient  will report ability to perform ADLs with low back and bilateral knee pain less than or equal to 4/10    Period  Weeks    Status  On-going      PT LONG TERM GOAL #3   Title  Patient will report ability to walk for 10 minutes or greater with low back pain and bilateral knee pain less than or equal to 4/10.    Time  6    Period  Weeks    Status  On-going            Plan - 11/21/19 1240    Clinical Impression Statement  Patient responded fairly well to therapy session but was having an increase of discomfort with right SLS duing hip abdution. Patient was able to complete with rests. Patient educated importance of exercise in various positions to build muscluature. Patient reported understanding. Patient provided with HEP as well as handout for home TENS unit. Patient instructed to bring unit in if she decides to buy it to help her with set up. Patient reported agreement.    Personal Factors and Comorbidities  Comorbidity 1;Time since onset of injury/illness/exacerbation    Comorbidities  RA, HTN, Depression    Examination-Activity Limitations  Bathing;Stand;Stairs;Hygiene/Grooming;Dressing;Locomotion Level;Transfers    Examination-Participation Restrictions  Community Activity    Stability/Clinical Decision Making  Stable/Uncomplicated    Clinical Decision Making  Low    Rehab Potential  Fair    PT Frequency  2x / week    PT Duration  6 weeks    PT Treatment/Interventions  ADLs/Self Care Home Management;Cryotherapy;Electrical Stimulation;Moist Heat;Ultrasound;Gait training;Stair training;Functional mobility training;Therapeutic activities;Therapeutic exercise;Balance training;Neuromuscular re-education;Patient/family education;Manual techniques;Passive range of motion;Vasopneumatic Device    PT Next Visit Plan  Nustep, gentle LE strengthening per tolerance, progress to standing when ready,  modalities for pain relief.    Consulted and Agree with Plan of Care  Patient       Patient will  benefit from skilled therapeutic intervention in order to improve the following deficits and impairments:  Decreased activity tolerance, Decreased strength, Difficulty walking, Decreased range of motion, Pain, Postural dysfunction, Decreased balance  Visit Diagnosis: Chronic pain of right knee  Chronic bilateral low back pain without sciatica  Difficulty in walking, not elsewhere classified     Problem List Patient Active Problem List   Diagnosis Date Noted  . Special screening for malignant neoplasms, colon   . Rheumatoid arthritis involving multiple sites with positive rheumatoid factor ([place]) 03/06/2019  . Primary osteoarthritis of both knees 03/06/2019  . Former smoker 03/06/2019  . ANA positive 02/12/2019  . Family history of thyroid disease 01/08/2019  . Morbid obesity ([place]) 01/08/2019  . Essential hypertension 01/08/2019  . Polyarthralgia 01/08/2019  . Family history of colon cancer 01/08/2019    Gabriela Eves, PT, DPT 11/21/2019, 12:44 PM  [facility] [address] [place], [place], [address] Phone: [phone]   Fax:  [phone]  Name: Jean Howell MRN: [id number] Date of Birth: [birth year]

## 2019-11-23 ENCOUNTER — Ambulatory Visit (INDEPENDENT_AMBULATORY_CARE_PROVIDER_SITE_OTHER): Payer: Medicare Other | Admitting: Family Medicine

## 2019-11-23 VITALS — BP 138/80

## 2019-11-23 DIAGNOSIS — M0579 Rheumatoid arthritis with rheumatoid factor of multiple sites without organ or systems involvement: Secondary | ICD-10-CM | POA: Diagnosis not present

## 2019-11-23 DIAGNOSIS — I10 Essential (primary) hypertension: Secondary | ICD-10-CM | POA: Diagnosis not present

## 2019-11-23 DIAGNOSIS — D84821 Immunodeficiency due to drugs: Secondary | ICD-10-CM | POA: Diagnosis not present

## 2019-11-23 DIAGNOSIS — Z79899 Other long term (current) drug therapy: Secondary | ICD-10-CM | POA: Diagnosis not present

## 2019-11-23 MED ORDER — AMITRIPTYLINE HCL 25 MG PO TABS: 25.0000 mg | ORAL_TABLET | Freq: Every day | ORAL | 3 refills | Status: DC

## 2019-11-23 NOTE — Patient Instructions (Signed)
Ok to use tylenol arthritis up to 3 times daily.  NO NSAIDS (ibuprofen/ motrin/ advil/ aleve/ naproxen)  Claritin on for ear.

## 2019-11-23 NOTE — Progress Notes (Signed)
Telephone visit  Subjective: CC: f/u hypertension, polyarthralgia PCP: [name], DO [id number] Schrom is a 63 y.o. female calls for telephone consult today. Patient provides verbal consent for consult held via phone.  Due to COVID-19 pandemic this visit was conducted virtually. This visit type was conducted due to national recommendations for restrictions regarding the COVID-19 Pandemic (e.g. social distancing, sheltering in place) in an effort to limit this patient's exposure and mitigate transmission in our community. All issues noted in this document were discussed and addressed.  A physical exam was not performed with this format.   Location of patient: home Location of provider: [facility] Others present for call: none  1.  Hypertension/morbid obesity Patient reports compliance with losartan-hydrochlorothiazide.  She keeps an eye on her blood pressure and systolics are never higher than 0000000 with diastolics no higher than [id number].  No chest pain, shortness of breath.  No lower extremity edema.  She also wants to see if we can get her scale and blood pressure cuff.  2.  Rheumatoid arthritis Patient is followed by rheumatology for rheumatoid arthritis with RF positive.  She is currently on Humira and methotrexate.  She is also taking folic acid for RA.  She notes that overall she is doing better than when we saw each other last time.  She seems to be ambulating more independently.  She does still continue to have some bad days but notes that she is only been on Humira for a little while now.  Her rheumatologist told her that it probably would start really taking until after 3 months.  She is optimistic about this.  She reports compliance with her folic acid.  She just wanted to clarify which medicine she could take together which needed to be separated.  She is interested in getting a thermometer and is asking if I can write a prescription for this.    ROS: Per HPI  No Known  Allergies Past Medical History:  Diagnosis Date  . Arthritis   . Depression   . Hypertension   . Rheumatoid arthritis ([place])     Current Outpatient Medications:  .  Adalimumab (HUMIRA PEN) 40 MG/0.4ML PNKT, Inject 40 mg into the skin every 14 (fourteen) days., Disp: 3 each, Rfl: 0 .  amitriptyline (ELAVIL) 25 MG tablet, Take 1 tablet (25 mg total) by mouth daily. (Needs to be seen before next refill), Disp: 30 tablet, Rfl: 0 .  folic acid (FOLVITE) 1 MG tablet, Take 2 tablets (2 mg total) by mouth daily., Disp: 180 tablet, Rfl: 3 .  losartan-hydrochlorothiazide (HYZAAR) 100-12.5 MG tablet, Take 1 tablet by mouth daily., Disp: 90 tablet, Rfl: 3 .  methotrexate (RHEUMATREX) 2.5 MG tablet, TAKE 6 TABLETS EVERY WEEK, Disp: 24 tablet, Rfl: 2  Blood pressure 138/80.  Assessment/ Plan: 63 y.o. female   1. Essential hypertension Controlled.  Continue current regimen.  Handwritten prescription for blood pressure cuff mailed to patient.  2. Rheumatoid arthritis involving multiple sites with positive rheumatoid factor (HCC) Improving.  Elavil renewed.  Continue to follow-up with rheumatologist as scheduled - amitriptyline (ELAVIL) 25 MG tablet; Take 1 tablet (25 mg total) by mouth at bedtime.  Dispense: 90 tablet; Refill: 3  3. Immunocompromised state due to drug therapy Handwritten prescription for thermometer mailed to patient.  4. Morbid obesity ([place]) Handwritten prescription for scale mailed to patient.   Start time: 1:27pm End time: 1:38pm  Total time spent on patient care (including telephone call/ virtual visit): 15 minutes  Jazminn Pomales M  [place], [place] [phone]

## 2019-11-26 ENCOUNTER — Other Ambulatory Visit: Payer: Self-pay | Admitting: Rheumatology

## 2019-11-26 DIAGNOSIS — M0579 Rheumatoid arthritis with rheumatoid factor of multiple sites without organ or systems involvement: Secondary | ICD-10-CM

## 2019-11-26 NOTE — Telephone Encounter (Signed)
Last Visit: 10/16/2019 Next Visit: 01/21/2020 Labs: 10/11/2019 Creatinine elevated but stable. GFR is stable. CBC WNL TB Gold: 02/26/2019 negative   Okay to refill per Dr. Estanislado Pandy.

## 2019-11-28 ENCOUNTER — Ambulatory Visit: Payer: Medicare Other | Admitting: Physical Therapy

## 2019-12-12 MED FILL — HUMIRA PEN 40 MG/0.4ML PNKT: 40 | 28 days supply | Qty: 2 | Fill #0

## 2020-01-09 MED FILL — HUMIRA PEN 40 MG/0.4ML PNKT: 40 | 28 days supply | Qty: 2 | Fill #1

## 2020-01-11 ENCOUNTER — Telehealth: Payer: Self-pay | Admitting: Pharmacy Technician

## 2020-01-11 NOTE — Telephone Encounter (Signed)
Received fax stating patient has new [employer] and new Prior Authorization for Humira is required.  ID- [id number] Phone# [phone]

## 2020-01-11 NOTE — Telephone Encounter (Signed)
Submitted a Prior Authorization request to [facility] for [place] via Phone. Will update once we receive a response.  Case# [id number] Phone# [phone]  3:56 PM Beatriz Chancellor, CPhT

## 2020-01-14 NOTE — Telephone Encounter (Signed)
Received notification from [facility] regarding a prior authorization for HUMIRA. Authorization has been APPROVED from 01/11/20 to 12/05/20.   Will send document to scan center.  Authorization # [id number] Phone # [phone]  8:26 AM Beatriz Chancellor, CPhT

## 2020-01-15 NOTE — Progress Notes (Signed)
Office Visit Note  Patient: Jean Howell             Date of Birth: [birth year]           MRN: [id number]             PCP: [name], DO Referring: [name], DO Visit Date: 01/21/2020 Occupation: @GUAROCC @  Subjective:  Pain in right knee   History of Present Illness: Zakaiya Grossman is a 64 y.o. female with a past medical history of seropositive rheumatoid arthritis and osteoarthritis. Patient endorses swelling in her right knee. She describes her right knee as feeling "tight and heavy." Patient states that her right knee is also sore at night. She states that her pain level has significantly improved and that she is no longer experiencing any pain. She endorses dry skin and numbness in her hands at night. Patient is taking Humira as prescribed and has recently stopped Methotrexate after receiving a phone call from our office advising her to do so.    Activities of Daily Living:  Patient reports morning stiffness for 2 minutes.   Patient Reports nocturnal pain.  Difficulty dressing/grooming: Denies Difficulty climbing stairs: Denies Difficulty getting out of chair: Denies Difficulty using hands for taps, buttons, cutlery, and/or writing: Reports  Review of Systems  Constitutional: Positive for fatigue. Negative for night sweats, weight gain and weight loss.  HENT: Positive for mouth dryness. Negative for mouth sores, trouble swallowing, trouble swallowing and nose dryness.   Eyes: Negative for pain, redness, visual disturbance and dryness.  Respiratory: Negative for cough, shortness of breath and difficulty breathing.   Cardiovascular: Negative for chest pain, palpitations, hypertension, irregular heartbeat and swelling in legs/feet.  Gastrointestinal: Negative for blood in stool, constipation and diarrhea.  Endocrine: Negative for increased urination.  Genitourinary: Negative for difficulty urinating and vaginal dryness.  Musculoskeletal: Positive for  arthralgias, gait problem, joint pain, joint swelling and morning stiffness. Negative for myalgias, muscle weakness, muscle tenderness and myalgias.  Skin: Negative for color change, rash, hair loss, skin tightness, ulcers and sensitivity to sunlight.  Allergic/Immunologic: Negative for susceptible to infections.  Neurological: Negative for dizziness, numbness, memory loss, night sweats and weakness.  Hematological: Negative for bruising/bleeding tendency and swollen glands.  Psychiatric/Behavioral: Positive for sleep disturbance. Negative for depressed mood. The patient is not nervous/anxious.     PMFS History:  Patient Active Problem List   Diagnosis Date Noted  . Special screening for malignant neoplasms, colon   . Rheumatoid arthritis involving multiple sites with positive rheumatoid factor ([facility]) 03/06/2019  . Primary osteoarthritis of both knees 03/06/2019  . Former smoker 03/06/2019  . ANA positive 02/12/2019  . Family history of thyroid disease 01/08/2019  . Morbid obesity ([place]) 01/08/2019  . Essential hypertension 01/08/2019  . Polyarthralgia 01/08/2019  . Family history of colon cancer 01/08/2019    Past Medical History:  Diagnosis Date  . Arthritis   . Depression   . Hypertension   . Rheumatoid arthritis ([place])     Family History  Problem Relation Age of Onset  . Cancer Father   . Colon cancer Father   . Thyroid disease Mother   . CVA Mother 56  . Thyroid disease Sister   . Healthy Son   . Congestive Heart Failure Sister   . Hypertension Sister   . Diabetes Sister   . Hypertension Sister   . Diabetes Sister   . Hypertension Sister   . Hypertension Sister   . Cancer Brother  kidney    Past Surgical History:  Procedure Laterality Date  . BREAST SURGERY     nodules removed  . COLONOSCOPY N/A 06/25/2019   Procedure: COLONOSCOPY;  Surgeon: Danie Binder, MD;  [facility];  Service: Endoscopy;  Laterality: N/A;  1:30  . POLYPECTOMY  06/25/2019    Procedure: POLYPECTOMY;  Surgeon: Danie Binder, MD;  Location: [facility];  Service: Endoscopy;;   Social History   Social History Narrative   Patient is a widow that resides independently in [place].  She has 1 son, who resides in [place].    There is no immunization history on file for this patient.   Objective: Vital Signs: BP (!) 154/79 (BP Location: Left Arm, Patient Position: Sitting, Cuff Size: Normal)   Pulse 83   Resp 18   Ht 5\' 3"  (1.6 m)   Wt 235 lb 12.8 oz (107 kg)   BMI 41.77 kg/m    Physical Exam Vitals and nursing note reviewed.  Constitutional:      Appearance: She is well-developed.  HENT:     Head: Normocephalic and atraumatic.  Eyes:     Conjunctiva/sclera: Conjunctivae normal.  Cardiovascular:     Rate and Rhythm: Normal rate and regular rhythm.     Heart sounds: Normal heart sounds.  Pulmonary:     Effort: Pulmonary effort is normal.     Breath sounds: Normal breath sounds.  Abdominal:     General: Bowel sounds are normal.     Palpations: Abdomen is soft.  Musculoskeletal:     Cervical back: Normal range of motion.  Lymphadenopathy:     Cervical: No cervical adenopathy.  Skin:    General: Skin is warm and dry.     Capillary Refill: Capillary refill takes less than 2 seconds.  Neurological:     Mental Status: She is alert and oriented to person, place, and time.  Psychiatric:        Behavior: Behavior normal.      Musculoskeletal Exam: C-spine was in good range of motion.  Shoulder joints elbow joints wrist joints with good range of motion.  She has no synovitis over MCPs or PIPs.  Some DIP and PIP thickening was noted.  She has crepitus in her bilateral knee joints more prominent in the right knee.  No warmth swelling or effusion was noted.  Ankle joints with good range of motion.  CDAI Exam: CDAI Score: 1.6  Patient Global: 3 mm; Provider Global: 3 mm Swollen: 0 ; Tender: 1  Joint Exam 01/21/2020      Right  Left  Knee    Tender      There is currently no information documented on the homunculus. Go to the Rheumatology activity and complete the homunculus joint exam.  Investigation: No additional findings.  Imaging: No results found.  Recent Labs: Lab Results  Component Value Date   WBC 4.8 01/16/2020   HGB 14.2 01/16/2020   PLT 370 01/16/2020   NA 137 01/16/2020   K 4.6 01/16/2020   CL 97 (L) 01/16/2020   CO2 31 01/16/2020   GLUCOSE 88 01/16/2020   BUN 14 01/16/2020   CREATININE 1.34 (H) 01/16/2020   BILITOT 0.7 01/16/2020   ALKPHOS 97 05/14/2019   AST 21 01/16/2020   ALT 35 (H) 01/16/2020   PROT 7.6 01/16/2020   ALBUMIN 3.5 (L) 05/14/2019   CALCIUM 9.7 01/16/2020   GFRAA 48 (L) 01/16/2020   QFTBGOLDPLUS NEGATIVE 02/26/2019  Speciality Comments: PLQ Eye Exam: 12/16/2018 WNL @ Dr Anthony Sar Follow up in 1 year  Procedures:  No procedures performed Allergies: Patient has no known allergies.   Assessment / Plan:     Visit Diagnoses: Rheumatoid arthritis involving multiple sites with positive rheumatoid factor (HCC) - Positive RF, +14-3-3 eta, Positive ANA, erosive inflammatory arthritis.  Patient had no synovitis on examination.  She has been on Humira and methotrexate combination.  We recently stopped methotrexate due to elevation in her creatinine.  High risk medication use - Humira 40 mg every 14 days, methotrexate 6 tablets every 7 days was discontinued due to elevated creatinine., and folic acid 2 mg daily. TB gold: 02/26/2019 negative  -we will recheck BMP in 2 weeks.  Plan: QuantiFERON-TB Gold Plus, BASIC METABOLIC PANEL WITH GFR  Primary osteoarthritis and rheumatoid arthritis of both knees-she been having increased pain and discomfort in her knee joints.  She has crepitus in her knee joints.  She would benefit from knee joint exercises which were emphasized.  No synovitis was noted.  ANA positive - Positive ANA-no titer, Scl-70+: She has no other clinical features of autoimmune  disease.   Other fatigue-improved.  Essential hypertension-her systolic blood pressure is elevated.  History of depression  Former smoker  Family history of colon cancer  Family history of thyroid disease  Orders: Orders Placed This Encounter  Procedures  . QuantiFERON-TB Gold Plus  . BASIC METABOLIC PANEL WITH GFR   No orders of the defined types were placed in this encounter.   Face-to-face time spent with patient was 30 minutes. Greater than 50% of time was spent in counseling and coordination of care.  Follow-Up Instructions: Return in about 5 months (around 06/19/2020) for Rheumatoid arthritis, Osteoarthritis.   Bo Merino, MD  Note - This record has been created using Editor, commissioning.  Chart creation errors have been sought, but may not always  have been located. Such creation errors do not reflect on  the standard of medical care.

## 2020-01-16 ENCOUNTER — Other Ambulatory Visit: Payer: Self-pay

## 2020-01-16 DIAGNOSIS — Z79899 Other long term (current) drug therapy: Secondary | ICD-10-CM

## 2020-01-17 LAB — CBC WITH DIFFERENTIAL/PLATELET
Absolute Monocytes: 221 cells/uL (ref 200–950)
Basophils Absolute: 38 cells/uL (ref 0–200)
Basophils Relative: 0.8 %
Eosinophils Absolute: 110 cells/uL (ref 15–500)
Eosinophils Relative: 2.3 %
HCT: 41.2 % (ref 35.0–45.0)
Hemoglobin: 14.2 g/dL (ref 11.7–15.5)
Lymphs Abs: 1646 cells/uL (ref 850–3900)
MCH: 31.4 pg (ref 27.0–33.0)
MCHC: 34.5 g/dL (ref 32.0–36.0)
MCV: 91.2 fL (ref 80.0–100.0)
MPV: 9.5 fL (ref 7.5–12.5)
Monocytes Relative: 4.6 %
Neutro Abs: 2784 cells/uL (ref 1500–7800)
Neutrophils Relative %: 58 %
Platelets: 370 10*3/uL (ref 140–400)
RBC: 4.52 10*6/uL (ref 3.80–5.10)
RDW: 16.2 % — ABNORMAL HIGH (ref 11.0–15.0)
Total Lymphocyte: 34.3 %
WBC: 4.8 10*3/uL (ref 3.8–10.8)

## 2020-01-17 LAB — COMPLETE METABOLIC PANEL WITH GFR
AG Ratio: 1.1 (calc) (ref 1.0–2.5)
ALT: 35 U/L — ABNORMAL HIGH (ref 6–29)
AST: 21 U/L (ref 10–35)
Albumin: 3.9 g/dL (ref 3.6–5.1)
Alkaline phosphatase (APISO): 78 U/L (ref 37–153)
BUN/Creatinine Ratio: 10 (calc) (ref 6–22)
BUN: 14 mg/dL (ref 7–25)
CO2: 31 mmol/L (ref 20–32)
Calcium: 9.7 mg/dL (ref 8.6–10.4)
Chloride: 97 mmol/L — ABNORMAL LOW (ref 98–110)
Creat: 1.34 mg/dL — ABNORMAL HIGH (ref 0.50–0.99)
GFR, Est African American: 48 mL/min/{1.73_m2} — ABNORMAL LOW (ref >=60)
GFR, Est Non African American: 41 mL/min/{1.73_m2} — ABNORMAL LOW (ref >=60)
Globulin: 3.7 g/dL (calc) (ref 1.9–3.7)
Glucose, Bld: 88 mg/dL (ref 65–99)
Potassium: 4.6 mmol/L (ref 3.5–5.3)
Sodium: 137 mmol/L (ref 135–146)
Total Bilirubin: 0.7 mg/dL (ref 0.2–1.2)
Total Protein: 7.6 g/dL (ref 6.1–8.1)

## 2020-01-17 NOTE — Progress Notes (Signed)
Creatinine is elevated.  GFR trending down-48.  ALT is borderline elevated.  She is taking MTX 6 tablets po once weekly. Please advise her to hold MTX. We will discuss other treatment options at her follow up visit on 01/21/20.  She will continue on Humira  as prescribed.  CBC stable.

## 2020-01-18 ENCOUNTER — Other Ambulatory Visit: Payer: Self-pay | Admitting: *Deleted

## 2020-01-21 ENCOUNTER — Other Ambulatory Visit: Payer: Self-pay

## 2020-01-21 ENCOUNTER — Ambulatory Visit: Payer: Medicare Other | Admitting: Rheumatology

## 2020-01-21 ENCOUNTER — Encounter: Payer: Self-pay | Admitting: Rheumatology

## 2020-01-21 VITALS — BP 154/79 | HR 83 | Resp 18 | Ht 63.0 in | Wt 235.8 lb

## 2020-01-21 DIAGNOSIS — M170 Bilateral primary osteoarthritis of knee: Secondary | ICD-10-CM | POA: Diagnosis not present

## 2020-01-21 DIAGNOSIS — R768 Other specified abnormal immunological findings in serum: Secondary | ICD-10-CM

## 2020-01-21 DIAGNOSIS — Z8659 Personal history of other mental and behavioral disorders: Secondary | ICD-10-CM | POA: Diagnosis not present

## 2020-01-21 DIAGNOSIS — Z87891 Personal history of nicotine dependence: Secondary | ICD-10-CM | POA: Diagnosis not present

## 2020-01-21 DIAGNOSIS — R5383 Other fatigue: Secondary | ICD-10-CM

## 2020-01-21 DIAGNOSIS — M0579 Rheumatoid arthritis with rheumatoid factor of multiple sites without organ or systems involvement: Secondary | ICD-10-CM | POA: Diagnosis not present

## 2020-01-21 DIAGNOSIS — Z79899 Other long term (current) drug therapy: Secondary | ICD-10-CM

## 2020-01-21 DIAGNOSIS — I10 Essential (primary) hypertension: Secondary | ICD-10-CM | POA: Diagnosis not present

## 2020-01-21 DIAGNOSIS — Z800 Family history of malignant neoplasm of digestive organs: Secondary | ICD-10-CM | POA: Diagnosis not present

## 2020-01-21 DIAGNOSIS — Z8349 Family history of other endocrine, nutritional and metabolic diseases: Secondary | ICD-10-CM

## 2020-01-21 NOTE — Patient Instructions (Signed)
Standing Labs We placed an order today for your standing lab work.    Please come back and get your standing labs in 2 weeks and then every 3 months  We have open lab daily Monday through Thursday from 8:30-12:30 PM and 1:30-4:30 PM and Friday from 8:30-12:30 PM and 1:30-4:00 PM at the office of Dr. Bo Merino.   You may experience shorter wait times on Monday and Friday afternoons. The office is located at [address], [place], [place], [place] [address] No appointment is necessary.   Labs are drawn by [facility].  You may receive a bill from [facility] for your lab work.  If you wish to have your labs drawn at another location, please call the office 24 hours in advance to send orders.  If you have any questions regarding directions or hours of operation,  please call [phone].   Just as a reminder please drink plenty of water prior to coming for your lab work. Thanks!

## 2020-02-04 ENCOUNTER — Other Ambulatory Visit: Payer: Self-pay | Admitting: *Deleted

## 2020-02-04 DIAGNOSIS — Z79899 Other long term (current) drug therapy: Secondary | ICD-10-CM

## 2020-02-05 NOTE — Progress Notes (Signed)
GFR is better.

## 2020-02-06 LAB — QUANTIFERON-TB GOLD PLUS
Mitogen-NIL: >10 [IU]/mL
NIL: 0.05 [IU]/mL
QuantiFERON-TB Gold Plus: NEGATIVE
TB1-NIL: 0.01 [IU]/mL
TB2-NIL: <0 [IU]/mL

## 2020-02-06 LAB — BASIC METABOLIC PANEL WITH GFR
BUN/Creatinine Ratio: 15 (calc) (ref 6–22)
BUN: 17 mg/dL (ref 7–25)
CO2: 27 mmol/L (ref 20–32)
Calcium: 9.2 mg/dL (ref 8.6–10.4)
Chloride: 104 mmol/L (ref 98–110)
Creat: 1.12 mg/dL — ABNORMAL HIGH (ref 0.50–0.99)
GFR, Est African American: 60 mL/min/{1.73_m2} (ref >=60)
GFR, Est Non African American: 52 mL/min/{1.73_m2} — ABNORMAL LOW (ref >=60)
Glucose, Bld: 79 mg/dL (ref 65–99)
Potassium: 4.3 mmol/L (ref 3.5–5.3)
Sodium: 141 mmol/L (ref 135–146)

## 2020-02-06 MED FILL — HUMIRA PEN 40 MG/0.4ML PNKT: 40 | 28 days supply | Qty: 2 | Fill #2

## 2020-03-05 ENCOUNTER — Other Ambulatory Visit: Payer: Self-pay | Admitting: Rheumatology

## 2020-03-05 DIAGNOSIS — M0579 Rheumatoid arthritis with rheumatoid factor of multiple sites without organ or systems involvement: Secondary | ICD-10-CM

## 2020-03-05 NOTE — Telephone Encounter (Signed)
Last Visit: 01/21/20 Next Visit: 06/18/20 Labs: 01/16/20 Creatinine is elevated. GFR trending down-48. ALT is borderline elevated. CBC stable TB Gold: 02/04/20 Neg   Okay to refill per Dr. Estanislado Pandy

## 2020-03-06 MED FILL — HUMIRA PEN 40 MG/0.4ML PNKT: 40 | 28 days supply | Qty: 2 | Fill #0

## 2020-03-10 ENCOUNTER — Other Ambulatory Visit: Payer: Self-pay | Admitting: Rheumatology

## 2020-03-10 NOTE — Telephone Encounter (Addendum)
Patient has discontinued MTX and folic acid per office note on 01/21/20.

## 2020-03-17 DIAGNOSIS — G5711 Meralgia paresthetica, right lower limb: Secondary | ICD-10-CM | POA: Diagnosis not present

## 2020-03-17 DIAGNOSIS — Z1211 Encounter for screening for malignant neoplasm of colon: Secondary | ICD-10-CM

## 2020-03-19 ENCOUNTER — Telehealth (INDEPENDENT_AMBULATORY_CARE_PROVIDER_SITE_OTHER): Payer: Medicare HMO | Admitting: Family Medicine

## 2020-03-19 ENCOUNTER — Other Ambulatory Visit: Payer: Self-pay | Admitting: Family Medicine

## 2020-03-19 DIAGNOSIS — Z5329 Procedure and treatment not carried out because of patient's decision for other reasons: Secondary | ICD-10-CM

## 2020-03-19 DIAGNOSIS — Z91199 Patient's noncompliance with other medical treatment and regimen due to unspecified reason: Secondary | ICD-10-CM

## 2020-03-19 NOTE — Progress Notes (Signed)
MyChart Video visit  No Show.  Start time: 8:15 am (video started) End time: 8:26pm (no answer. Waited for 11 minutes before hanging up)

## 2020-03-24 ENCOUNTER — Telehealth (INDEPENDENT_AMBULATORY_CARE_PROVIDER_SITE_OTHER): Payer: Medicare HMO | Admitting: Family Medicine

## 2020-03-24 DIAGNOSIS — M792 Neuralgia and neuritis, unspecified: Secondary | ICD-10-CM

## 2020-03-24 DIAGNOSIS — I10 Essential (primary) hypertension: Secondary | ICD-10-CM

## 2020-03-24 MED ORDER — LOSARTAN POTASSIUM-HCTZ 100-12.5 MG PO TABS: 1.0000 | ORAL_TABLET | Freq: Every day | ORAL | 3 refills | Status: DC

## 2020-03-24 NOTE — Progress Notes (Signed)
MyChart Video visit  Subjective: CC: arthritis PCP: [name], DO AXE:[id number] Jean Howell is a 64 y.o. female. Patient provides verbal consent for consult held via video.  Due to COVID-19 pandemic this visit was conducted virtually. This visit type was conducted due to national recommendations for restrictions regarding the COVID-19 Pandemic (e.g. social distancing, sheltering in place) in an effort to limit this patient's exposure and mitigate transmission in our community. All issues noted in this document were discussed and addressed.  A physical exam was not performed with this format.   Location of patient: home Location of provider: [facility] Others present for call: none  1. Arthritis Patient with known rheumatoid arthritis.  She is seen by rheumatology.  She is treated with Humira and folic acid.  Methotrexate discontinued due to elevated creatinine.  She did not administer her Humira shot totally and she had a flare of RA.   She reports to me that arthritis has flared as a result of trying to keep her legs cold at bedtime.  She has been having a burning sensation in bilateral legs that started in the feet and has progressed to her hips bilaterally.  She saw someone at urgent care and it was thought to be meralgia parasthetica.  She was placed on prednisone dosepak, which did help but she is nearing the end of the pak.  She is wanting to know what to do.  She has not contacted Dr D about the Humira issue or flare in her joints.  Her follow up is not until this summer.    ROS: Per HPI  No Known Allergies Past Medical History:  Diagnosis Date  . Arthritis   . Depression   . Hypertension   . Rheumatoid arthritis (HCC)     Current Outpatient Medications:  .  amitriptyline (ELAVIL) 25 MG tablet, Take 1 tablet (25 mg total) by mouth at bedtime., Disp: 90 tablet, Rfl: 3 .  folic acid (FOLVITE) 1 MG tablet, Take 2 tablets (2 mg total) by mouth daily., Disp: 180 tablet, Rfl: 3 .   HUMIRA PEN 40 MG/0.4ML PNKT, INJECT 40 MG INTO THE SKIN EVERY 14 (FOURTEEN) DAYS., Disp: 6 each, Rfl: 0 .  losartan-hydrochlorothiazide (HYZAAR) 100-12.5 MG tablet, Take 1 tablet by mouth daily., Disp: 90 tablet, Rfl: 3  Gen: well groomed, well appearing female MSK: antalgic gait   Assessment/ Plan: 64 y.o. female   1. Neuralgia Uncertain etiology.  Will look for metabolic cause.  I am going to reach out to her pharmacist to figure out why she is running out of the amitriptyline prematurely.  I will also reach out to her rheumatologist about her polyarthralgia which I suspect is likely related to in adequate injection of the Humira.  May need to have closer follow-up. - Bayer DCA Hb A1c Waived; Future - Vitamin B12; Future - CMP14+EGFR; Future - CBC with Differential; Future  2. Essential hypertension - losartan-hydrochlorothiazide (HYZAAR) 100-12.5 MG tablet; Take 1 tablet by mouth daily.  Dispense: 90 tablet; Refill: 3   Start time: 10:30am (text sent); 10:44am (called back after being disconnected) End time: 10:44am (video ended); 10:45am (hung up)  Total time spent on patient care (including video visit/ documentation): 25 minutes  [place], [place] [phone]

## 2020-03-25 ENCOUNTER — Other Ambulatory Visit: Payer: Self-pay

## 2020-03-25 ENCOUNTER — Other Ambulatory Visit: Payer: Medicare HMO

## 2020-03-25 DIAGNOSIS — M792 Neuralgia and neuritis, unspecified: Secondary | ICD-10-CM

## 2020-03-25 DIAGNOSIS — R739 Hyperglycemia, unspecified: Secondary | ICD-10-CM | POA: Diagnosis not present

## 2020-03-25 LAB — BAYER DCA HB A1C WAIVED: HB A1C (BAYER DCA - WAIVED): 7 % — ABNORMAL HIGH (ref <7.0)

## 2020-03-26 ENCOUNTER — Telehealth: Payer: Self-pay | Admitting: Pharmacist

## 2020-03-26 LAB — CBC WITH DIFFERENTIAL/PLATELET
Basophils Absolute: 0 10*3/uL (ref 0.0–0.2)
Basos: 0 %
EOS (ABSOLUTE): 0.1 10*3/uL (ref 0.0–0.4)
Eos: 1 %
Hematocrit: 38.3 % (ref 34.0–46.6)
Hemoglobin: 13.2 g/dL (ref 11.1–15.9)
Immature Grans (Abs): 0 10*3/uL (ref 0.0–0.1)
Immature Granulocytes: 0 %
Lymphocytes Absolute: 2.8 10*3/uL (ref 0.7–3.1)
Lymphs: 26 %
MCH: 28.8 pg (ref 26.6–33.0)
MCHC: 34.5 g/dL (ref 31.5–35.7)
MCV: 84 fL (ref 79–97)
Monocytes Absolute: 0.8 10*3/uL (ref 0.1–0.9)
Monocytes: 8 %
Neutrophils Absolute: 7 10*3/uL (ref 1.4–7.0)
Neutrophils: 65 %
Platelets: 492 10*3/uL — ABNORMAL HIGH (ref 150–450)
RBC: 4.58 x10E6/uL (ref 3.77–5.28)
RDW: 13.3 % (ref 11.7–15.4)
WBC: 10.7 10*3/uL (ref 3.4–10.8)

## 2020-03-26 LAB — CMP14+EGFR
ALT: 32 IU/L (ref 0–32)
AST: 17 IU/L (ref 0–40)
Albumin/Globulin Ratio: 0.8 — ABNORMAL LOW (ref 1.2–2.2)
Albumin: 3.3 g/dL — ABNORMAL LOW (ref 3.8–4.8)
Alkaline Phosphatase: 75 IU/L (ref 39–117)
BUN/Creatinine Ratio: 13 (ref 12–28)
BUN: 16 mg/dL (ref 8–27)
Bilirubin Total: 0.6 mg/dL (ref 0.0–1.2)
CO2: 22 mmol/L (ref 20–29)
Calcium: 9.4 mg/dL (ref 8.7–10.3)
Chloride: 94 mmol/L — ABNORMAL LOW (ref 96–106)
Creatinine, Ser: 1.24 mg/dL — ABNORMAL HIGH (ref 0.57–1.00)
GFR calc Af Amer: 53 mL/min/{1.73_m2} — ABNORMAL LOW (ref >59)
GFR calc non Af Amer: 46 mL/min/{1.73_m2} — ABNORMAL LOW (ref >59)
Globulin, Total: 4.2 g/dL (ref 1.5–4.5)
Glucose: 103 mg/dL — ABNORMAL HIGH (ref 65–99)
Potassium: 4.5 mmol/L (ref 3.5–5.2)
Sodium: 137 mmol/L (ref 134–144)
Total Protein: 7.5 g/dL (ref 6.0–8.5)

## 2020-03-26 LAB — VITAMIN B12: Vitamin B-12: 1728 pg/mL — ABNORMAL HIGH (ref 232–1245)

## 2020-03-26 NOTE — Telephone Encounter (Signed)
-----   Message from Bo Merino, MD sent at 03/25/2020  4:24 PM EDT ----- I spoke with patient today.  She is having difficulty injecting herself with Humira.  She states she could not inject herself with the previous Humira injection.  Yesterday, she dropped Humira pen and could not inject herself.  She has missed 2 doses of Humira.  Please schedule an earlier appointment so we can start her on in-office Cimzia injections.  Patient was in agreement to proceed with in-office injections. Please, apply for the medication. Thank you, Bo Merino, MD  ----- Message ----- From: [name], DO Sent: 03/24/2020  10:49 AM EDT To: Bo Merino, MD  Hi, Dr D.  So this patient apparently is having a flare of her arthritis.  She told me she did not get the entire humira injection to go in (so I suspect this to be the cause).  I told her I'd reach out to you to see if she needed to be seen sooner than her summer follow up.  Thanks, [employer]. Lajuana Ripple, [facility]

## 2020-03-27 ENCOUNTER — Telehealth: Payer: Self-pay | Admitting: Rheumatology

## 2020-03-27 NOTE — Telephone Encounter (Signed)
Calling to let you know in office Cimza is not covered under patients plan. ? Change to  prefilled syringe, or alternate therapy. Please call to advise.

## 2020-03-27 NOTE — Telephone Encounter (Signed)
Cimzia In office PA is pending, will await determination to decide if change is needed.

## 2020-03-27 NOTE — Telephone Encounter (Signed)
Submitted patient info to [employer] for benefit review.  Submitted a Prior Authorization request to [place] for [facility] in office- [phone] via Phone. Will update once we receive a response.  PA# [id number] Phone# [phone] Fax# [phone]  10:47 AM Beatriz Chancellor, CPhT

## 2020-03-28 ENCOUNTER — Telehealth: Payer: Self-pay | Admitting: Rheumatology

## 2020-03-28 NOTE — Telephone Encounter (Signed)
Patient calling to let you know she did not take Humira Monday because she dropped it, and something was in it. Patient called for a refill, and it will be delivered Wednesday. Patient wants to know if she should take it when it comes? Please call to advise.

## 2020-03-28 NOTE — Telephone Encounter (Signed)
Returned patient call and no answer.  Requested return call.  We are still waiting on insurance approval for in office Cimzia.  Patient can take medication when it arrives of Wednesday if it has been 14 days or longer since her previous dose (4/14 or prior).   Mariella Saa, PharmD, [place], [place] Clinical Specialty Pharmacist [phone]  03/28/2020 2:44 PM

## 2020-03-31 MED FILL — HUMIRA PEN 40 MG/0.4ML PNKT: 40 | 28 days supply | Qty: 2 | Fill #1

## 2020-04-01 ENCOUNTER — Telehealth: Payer: Self-pay | Admitting: Rheumatology

## 2020-04-01 ENCOUNTER — Telehealth: Payer: Self-pay | Admitting: Family Medicine

## 2020-04-01 NOTE — Telephone Encounter (Signed)
Pt called stating that she recently had an appt with Dr Lajuana Ripple regarding her pain. Says she was told by Dr Lajuana Ripple that she would send Rx in for pain but pt says they pharmacy has not received anything. Pt requesting pain medication.

## 2020-04-01 NOTE — Telephone Encounter (Signed)
Received notification from [facility] regarding a prior authorization for [facility] In office. Authorization has been APPROVED from 03/27/20 to 12/05/20.   Authorization # [id number] Phone # [phone]  New [employer] benefits review, reveals $40 office visit copay and 20% coinsurance  8:45 AM Beatriz Chancellor, CPhT

## 2020-04-01 NOTE — Telephone Encounter (Signed)
Video visit 03/24/20

## 2020-04-01 NOTE — Telephone Encounter (Signed)
This in fact was not what was discussed.  What I did is reach out to her rheumatologist about an adequate Humira injection.  From what I can see the rheumatologist has reached out to her about this.  Additionally, I have reached out to her pharmacist about her amitriptyline which should have been ready for pickup.  He was going to refill it and reach out to her to let her know is ready.  Much of her issues are coming from her uncontrolled rheumatoid arthritis.  Again I will defer back to her rheumatologist for treatment of this as this is outside of the scope of my practice.  If she would like a separate pain referral, I would be willing to place this.

## 2020-04-01 NOTE — Telephone Encounter (Signed)
Patient left a voicemail stating she was returning Jean Howell's call regarding her Humira and if she should take it.

## 2020-04-02 NOTE — Telephone Encounter (Signed)
Left message for pt to return call.

## 2020-04-02 NOTE — Telephone Encounter (Signed)
Ok to apply for Enbrel 50 mg sq injections once weekly.

## 2020-04-02 NOTE — Telephone Encounter (Signed)
Received call from patient asking if she can take her next Humira injection which she received yesterday in the mail.  Patient has been having difficulty injecting Humira.  Patient states that on March 8th she had a defective pen and did not receive all of the injection.  She requested a replacement which she just received from the pharmacy.  She was able to administer her next injection on March 22 without issue.  For her next dose on April 5th, patient states she dropped the pen device which caused malfunction.  This was her last dose of Humira.  Due to difficulty injecting applied for Cimzia in office injection.  Patient has a Medicare advantage plan.  Cimzia in office injection was approved but patient would be required to pay $40 for office visit and will be responsible for 20% of the medication which would be 100s of dollars.  Notified patient and states that it is unaffordable.  There are no patient assistance programs for Cimzia.  Instructed patient to administer Humira today and we can look into other options since she is having difficulty with the pen device.  Advised that we would not be able to start a new medication until 2 weeks after her last Humira dose and will hopefully have a response from insurance prior to her next dose of Humira.  Patient verbalized understanding.  Patient was on methotrexate 6 tablets weekly but discontinued in February 2021 due to borderline elevated ALT.  She has not tried any other medication besides Humira, Plaquenil, and methotrexate.  Enbrel device is easy to use and patient currently.  Believe this would be a good option since Cimzia in office injections are unaffordable.  I do not see any contraindications to Enbrel.  Would you like [place] to apply for approval of Enbrel through insurance?  Mariella Saa, PharmD, [place], [place] Clinical Specialty Pharmacist [phone]  04/02/2020 9:02 AM

## 2020-04-02 NOTE — Telephone Encounter (Signed)
Submitted a Prior Authorization request to [facility] for ENBREL via Cover My Meds. Will update once we receive a response.  Gita KudoMarygrace Drought) - [id number]

## 2020-04-02 NOTE — Telephone Encounter (Signed)
Pt made aware that most of her concerns are due to her uncontrolled RA. She understood and knows to follow up with her rheumatologist. Patient will be starting her Humira today and injecting every 2 weeks. She has already picked up her amitriptyline prescription. Patient knows in the future to address the pain concerns with Rheumatology.

## 2020-04-02 NOTE — Telephone Encounter (Signed)
Addressed in another encounter.  Nothing further needed.  Mariella Saa, PharmD, [place], [place] Clinical Specialty Pharmacist [phone]  04/02/2020 8:53 AM

## 2020-04-03 NOTE — Telephone Encounter (Signed)
Received notification from [facility] regarding a prior authorization for ENBREL. Authorization has been APPROVED from 12/07/19 to 12/05/20.   Authorization # [id number] Phone # [phone]  Ran test claim, patient's copay is $0.00. Patient fills through [facility].

## 2020-04-08 NOTE — Telephone Encounter (Signed)
Attempted to contact patient and left message on machine to advise patient Ok to continue on Humira.  advised the patient to notify us if she changes her mind.  We can further discuss at her follow up visit on 06/18/20.

## 2020-04-08 NOTE — Telephone Encounter (Signed)
Attempted to contact patient and left message on machine to advise patient to call the office to schedule.

## 2020-04-08 NOTE — Telephone Encounter (Signed)
Please schedule an office visit and visit with Jean Howell to start her on Enbrel at the same appointment.  It would be a short visit, should be okay to overbook the appointment.

## 2020-04-08 NOTE — Telephone Encounter (Signed)
Patient called stating she is not able to schedule an appointment due to not having any transportation available.  Patient states she would like to stay on her Humira.  Please advise.

## 2020-04-08 NOTE — Telephone Encounter (Signed)
Patient approved for Enbrel Mini.  Does she need to have an office visit prior to new start or can we go ahead and schedule a new start appointment?  Thanks! Museum/gallery conservator

## 2020-04-08 NOTE — Telephone Encounter (Signed)
Thank you for informing us.  Ok to continue on Humira.  Please advise the patient to notify us if she changes her mind.  We can further discuss at her follow up visit on 06/18/20.

## 2020-05-07 MED FILL — HUMIRA PEN 40 MG/0.4ML PNKT: 40 | 28 days supply | Qty: 2 | Fill #2

## 2020-05-14 ENCOUNTER — Telehealth: Payer: Self-pay | Admitting: Pharmacy Technician

## 2020-05-14 NOTE — Telephone Encounter (Signed)
Patient had insurance change.  Submitted a Prior Authorization request to [facility] for [place] via Cover My Meds. Will update once we receive a response.   (KeyJaneice Robinson) - [id number]

## 2020-05-15 NOTE — Telephone Encounter (Signed)
Received notification from [facility] regarding a prior authorization for [place]. Authorization has been APPROVED from 05/14/20 to 12/05/20.   Authorization # [id number] Phone # [phone]

## 2020-06-03 ENCOUNTER — Telehealth: Payer: Self-pay | Admitting: Rheumatology

## 2020-06-03 ENCOUNTER — Other Ambulatory Visit: Payer: Self-pay | Admitting: Rheumatology

## 2020-06-03 DIAGNOSIS — M0579 Rheumatoid arthritis with rheumatoid factor of multiple sites without organ or systems involvement: Secondary | ICD-10-CM

## 2020-06-03 MED FILL — HUMIRA PEN 40 MG/0.4ML PNKT: 40 | 28 days supply | Qty: 2 | Fill #0

## 2020-06-03 NOTE — Telephone Encounter (Signed)
Patient called requesting prescription refill of Humira.  Patient states her insurance changed on 05/06/20 to [facility] which has been updated in her chart.

## 2020-06-03 NOTE — Telephone Encounter (Signed)
Please call the patient to discuss that if she is no longer taking MTX she does not need to continue folic acid.

## 2020-06-03 NOTE — Telephone Encounter (Signed)
Last Visit: 01/21/20 Next Visit: 06/18/20 Labs: 03/25/2020 Glucose 03, Creat. 1.24 GFR 53 Chloride 94, Albumin 3.3, Albumin/GLobulin Ratio 0.8 Platelets 492 TB Gold: 02/04/20 Neg   Current Dose per office note on 01/21/2020: Humira 40 mg every 14 days  Okay to refill Humira?

## 2020-06-03 NOTE — Telephone Encounter (Signed)
See refill request note for details.  

## 2020-06-03 NOTE — Telephone Encounter (Signed)
Last Visit: 01/21/20 Next Visit: 06/18/20  Per office note on 01/21/2020 patient was discontinued off MTX  Okay to refill Folic Acid?

## 2020-06-03 NOTE — Telephone Encounter (Signed)
Patient left a voicemail requesting prescription refill of Folic Acid to be sent to [facility] in [place].

## 2020-06-03 NOTE — Telephone Encounter (Signed)
See Rx refill note for details.

## 2020-06-04 NOTE — Telephone Encounter (Signed)
Patient advised as she has discontinued MTX she no longer needs to take the Folic Acid.

## 2020-06-06 NOTE — Progress Notes (Signed)
Office Visit Note  Patient: Jean Howell             Date of Birth: 02-26-1956           MRN: [id number]             PCP: [name], DO Referring: [name], DO Visit Date: 06/18/2020 Occupation: @GUAROCC @  Subjective:  Other (increased pain)   History of Present Illness: Jean Howell is a 64 y.o. female with seropositive rheumatoid arthritis and osteoarthritis.  She had to stop methotrexate due to elevated creatinine.  She states since she stopped methotrexate arthritis is getting worse.  She has been having pain and swelling in multiple joints.  She is having difficulty doing routine activities.  She states all of her joints are swollen and painful.  She has been taking Humira every other week.  Activities of Daily Living:  Patient reports morning stiffness for 1  hour.   Patient Reports nocturnal pain.  Difficulty dressing/grooming: Reports Difficulty climbing stairs: Reports Difficulty getting out of chair: Reports Difficulty using hands for taps, buttons, cutlery, and/or writing: Reports  Review of Systems  Constitutional: Positive for fatigue.  HENT: Positive for mouth dryness. Negative for mouth sores and nose dryness.   Eyes: Negative for itching and dryness.  Respiratory: Negative for shortness of breath and difficulty breathing.   Cardiovascular: Negative for chest pain and palpitations.  Gastrointestinal: Negative for blood in stool, constipation and diarrhea.  Endocrine: Negative for increased urination.  Genitourinary: Positive for difficulty urinating.  Musculoskeletal: Positive for arthralgias, joint pain, joint swelling, myalgias, morning stiffness, muscle tenderness and myalgias.  Skin: Positive for color change. Negative for rash and redness.  Allergic/Immunologic: Negative for susceptible to infections.  Neurological: Positive for numbness and weakness. Negative for dizziness, headaches and memory loss.  Hematological: Negative for  bruising/bleeding tendency.  Psychiatric/Behavioral: Negative for confusion.    PMFS History:  Patient Active Problem List   Diagnosis Date Noted  . Full code status 06/16/2020  . Impaired mobility and endurance 06/16/2020  . Sciatica of left side 06/16/2020  . Screen for colon cancer 03/17/2020  . Rheumatoid arthritis involving multiple sites with positive rheumatoid factor ([place]) 03/06/2019  . Primary osteoarthritis of both knees 03/06/2019  . Former smoker 03/06/2019  . ANA positive 02/12/2019  . Family history of thyroid disease 01/08/2019  . Morbid obesity ([place]) 01/08/2019  . Essential hypertension 01/08/2019  . Polyarthralgia 01/08/2019  . Family history of colon cancer 01/08/2019    Past Medical History:  Diagnosis Date  . Arthritis   . Depression   . Hypertension   . Rheumatoid arthritis ([place])     Family History  Problem Relation Age of Onset  . Cancer Father   . Colon cancer Father   . Thyroid disease Mother   . CVA Mother 4  . Thyroid disease Sister   . Healthy Son   . Congestive Heart Failure Sister   . Hypertension Sister   . Diabetes Sister   . Hypertension Sister   . Diabetes Sister   . Hypertension Sister   . Hypertension Sister   . Cancer Brother        kidney    Past Surgical History:  Procedure Laterality Date  . BREAST SURGERY     nodules removed  . COLONOSCOPY N/A 06/25/2019   Procedure: COLONOSCOPY;  Surgeon: Danie Binder, MD;  [facility];  Service: Endoscopy;  Laterality: N/A;  1:30  . POLYPECTOMY  06/25/2019  Procedure: POLYPECTOMY;  Surgeon: Danie Binder, MD;  [facility];  Service: Endoscopy;;   Social History   Social History Narrative   Patient is a widow that resides independently in [place].  She has 1 son, who resides in [place].   Immunization History  Administered Date(s) Administered  . Influenza, Quadrivalent, Recombinant, Inj, Pf 09/19/2019  . PFIZER SARS-COV-2 Vaccination  05/23/2020, 06/13/2020     Objective: Vital Signs: BP 120/84 (BP Location: Left Arm, Patient Position: Sitting, Cuff Size: Normal)   Pulse 87   Resp 18   Ht 5\' 3"  (1.6 m)   Wt 219 lb 6.4 oz (99.5 kg)   BMI 38.86 kg/m    Physical Exam Vitals and nursing note reviewed.  Constitutional:      Appearance: She is well-developed.  HENT:     Head: Normocephalic and atraumatic.  Eyes:     Conjunctiva/sclera: Conjunctivae normal.  Cardiovascular:     Rate and Rhythm: Normal rate and regular rhythm.     Heart sounds: Normal heart sounds.  Pulmonary:     Effort: Pulmonary effort is normal.     Breath sounds: Normal breath sounds.  Abdominal:     General: Bowel sounds are normal.     Palpations: Abdomen is soft.  Musculoskeletal:     Cervical back: Normal range of motion.  Lymphadenopathy:     Cervical: No cervical adenopathy.  Skin:    General: Skin is warm and dry.     Capillary Refill: Capillary refill takes less than 2 seconds.  Neurological:     Mental Status: She is alert and oriented to person, place, and time.  Psychiatric:        Behavior: Behavior normal.      Musculoskeletal Exam: C-spine was not with range of motion.  She has painful range of motion of bilateral shoulders.  Palpation abdomen was transmitted with range of motion.  She has synovitis of her bilateral wrist joints MCPs and PIPs as described below.  She is swelling her bilateral knee joints and ankle joints.  CDAI Exam: CDAI Score: 19.4  Patient Global: 7 mm; Provider Global: 7 mm Swollen: 10 ; Tender: 13  Joint Exam 06/18/2020      Right  Left  Glenohumeral   Tender   Tender  Wrist  Swollen Tender  Swollen Tender  MCP 2  Swollen Tender  Swollen Tender  MCP 3  Swollen Tender     MCP 5  Swollen Tender     Knee  Swollen Tender  Swollen Tender  Ankle  Swollen Tender  Swollen Tender  MTP 2   Tender        Investigation: No additional findings.  Imaging: No results found.  Recent Labs: Lab  Results  Component Value Date   WBC 10.7 03/25/2020   HGB 13.2 03/25/2020   PLT 492 (H) 03/25/2020   NA 137 03/25/2020   K 4.5 03/25/2020   CL 94 (L) 03/25/2020   CO2 22 03/25/2020   GLUCOSE 103 (H) 03/25/2020   BUN 16 03/25/2020   CREATININE 1.24 (H) 03/25/2020   BILITOT 0.6 03/25/2020   ALKPHOS 75 03/25/2020   AST 17 03/25/2020   ALT 32 03/25/2020   PROT 7.5 03/25/2020   ALBUMIN 3.3 (L) 03/25/2020   CALCIUM 9.4 03/25/2020   GFRAA 53 (L) 03/25/2020   QFTBGOLDPLUS NEGATIVE 02/04/2020    Speciality Comments: PLQ Eye Exam: 12/16/2018 WNL @ Dr Anthony Sar Follow up in 1 year  Procedures:  No procedures performed Allergies: Patient has no known allergies.   Assessment / Plan:     Visit Diagnoses: Rheumatoid arthritis involving multiple sites with positive rheumatoid factor (HCC) - Positive RF, +14-3-3 eta, Positive ANA, erosive inflammatory arthritis.  Patient is having a flare of rheumatoid arthritis since she has been off methotrexate.  She had pain and swelling in multiple joints as described above.  A detailed discussion regarding different treatment options and their side effects.  Methotrexate was discontinued due to elevated creatinine.  We discussed the option of adding leflunomide.  Indications and side effects of leflunomide was discussed.  We will obtain labs today.  If her labs are normal we will start her on leflunomide 10 mg p.o. daily and will increase it to 20 mg p.o. daily at the follow-up visit.  We will check labs in 2 weeks and then in a month.  I will also give her a prednisone taper starting at 20 mg and taper by 5 mg every 4 days as a bridging therapy.  Medication counseling:   Baseline Immunosuppressant Therapy Labs  Quantiferon TB Gold Latest Ref Rng & Units 02/04/2020  Quantiferon TB Gold Plus NEGATIVE NEGATIVE    Hepatitis Latest Ref Rng & Units 02/26/2019  Hep B Surface Ag NON-REACTI NON-REACTIVE  Hep B IgM NON-REACTI NON-REACTIVE    Lab Results  Component  Value Date   HIV Non Reactive 01/08/2019    Immunoglobulin Electrophoresis Latest Ref Rng & Units 02/26/2019  IgA  70 - 320 mg/dL 967(H)  IgG 600 - 1,540 mg/dL 1,848(H)  IgM 50 - 300 mg/dL 118    Serum Protein Electrophoresis Latest Ref Rng & Units 03/25/2020  Total Protein 6.0 - 8.5 g/dL 7.5  Albumin 3.8 - 4.8 g/dL -  Alpha-1 0.2 - 0.3 g/dL -  Alpha-2 0.5 - 0.9 g/dL -  Beta Globulin 0.4 - 0.6 g/dL -  Beta 2 0.2 - 0.5 g/dL -  Gamma Globulin 0.8 - 1.7 g/dL -    Lab Results  Component Value Date   G6PDH 14.3 02/26/2019    No results found for: TPMT   Patient was counseled on the purpose, proper use, and adverse effects of leflunomide including risk of infection, nausea/diarrhea/weight loss, increase in blood pressure, rash, hair loss, tingling in the hands and feet, and signs and symptoms of interstitial lung disease.   Also counseled on Black Box warning of liver injury and importance of avoiding alcohol while on therapy. Discussed that there is the possibility of an increased risk of malignancy but it is not well understood if this increased risk is due to the medication or the disease state.  Counseled patient to avoid live vaccines. Recommend annual influenza, Pneumovax 23, Prevnar 13, and Shingrix as indicated.   Discussed the importance of frequent monitoring of liver function and blood count.  Standing orders placed.  Provided patient with educational materials on leflunomide and answered all questions.  Patient consented to [place] use, and consent will be uploaded into the media tab.   Patient dose will be 10 mg p.o. daily.  Prescription pending lab results and/or insurance approval.  High risk medication use - Humira 40 mg every 14 days,( methotrexate 6 tablets every 7 days was discontinued due to elevated creatinine) - Plan: CBC with Differential/Platelet, COMPLETE METABOLIC PANEL WITH GFR  Primary osteoarthritis and rheumatoid arthritis of both knees  ANA positive - Positive  ANA-no titer, Scl-70+:  Other fatigue  Essential hypertension-blood pressure is controlled.  History of depression  Former smoker  Family history of thyroid disease  Family history of colon cancer  Orders: Orders Placed This Encounter  Procedures  . CBC with Differential/Platelet  . COMPLETE METABOLIC PANEL WITH GFR   Meds ordered this encounter  Medications  . predniSONE (DELTASONE) 5 MG tablet    Sig: Take 4 tabs po x 4 days, 3  tabs po x 4 days, 2  tabs po x 4 days, 1  tab po x 4 days    Dispense:  40 tablet    Refill:  0      Follow-Up Instructions: Return in about 4 weeks (around 07/16/2020) for Rheumatoid arthritis, Osteoarthritis.   Bo Merino, MD  Note - This record has been created using Editor, commissioning.  Chart creation errors have been sought, but may not always  have been located. Such creation errors do not reflect on  the standard of medical care.

## 2020-06-16 ENCOUNTER — Ambulatory Visit (INDEPENDENT_AMBULATORY_CARE_PROVIDER_SITE_OTHER): Payer: Medicare Other | Admitting: Family Medicine

## 2020-06-16 ENCOUNTER — Encounter: Payer: Self-pay | Admitting: Family Medicine

## 2020-06-16 ENCOUNTER — Other Ambulatory Visit: Payer: Self-pay

## 2020-06-16 VITALS — BP 111/75 | HR 68 | Temp 97.0°F | Ht 63.0 in | Wt 217.0 lb

## 2020-06-16 DIAGNOSIS — Z7409 Other reduced mobility: Secondary | ICD-10-CM | POA: Insufficient documentation

## 2020-06-16 DIAGNOSIS — Z0000 Encounter for general adult medical examination without abnormal findings: Secondary | ICD-10-CM

## 2020-06-16 DIAGNOSIS — M5432 Sciatica, left side: Secondary | ICD-10-CM

## 2020-06-16 DIAGNOSIS — Z789 Other specified health status: Secondary | ICD-10-CM | POA: Insufficient documentation

## 2020-06-16 DIAGNOSIS — M0579 Rheumatoid arthritis with rheumatoid factor of multiple sites without organ or systems involvement: Secondary | ICD-10-CM

## 2020-06-16 MED ORDER — GABAPENTIN 100 MG PO CAPS: ORAL_CAPSULE | ORAL | 3 refills | Status: DC

## 2020-06-16 NOTE — Progress Notes (Signed)
Subjective:   Jean Howell is a 64 y.o. female who presents for an Initial Medicare Annual Wellness Visit.  Review of Systems    Has chronic arthritis of multiple joints.  Has known rheumatoid arthritis and is currently being treated with Humira.  Symptoms are getting somewhat better but she still has quite a bit of pain.  She has follow-up with Dr. Estanislado Pandy in 2 days.  She reports chronic left lower extremity pain that is sciatic in nature.  She is currently on amitriptyline but does not find that to be helpful.  Cannot take NSAIDs secondary to CKD 3  Cardiac Risk Factors include: advanced age (>44men, >33 women);hypertension;sedentary lifestyle;obesity (BMI >30kg/m2)     Objective:    Today's Vitals   06/16/20 1514  BP: 111/75  Pulse: 68  Temp: (!) 97 F (36.1 C)  TempSrc: Temporal  SpO2: 95%  Weight: 217 lb (98.4 kg)  Height: 5\' 3"  (1.6 m)  PainSc: 7    Body mass index is 38.44 kg/m.   Gen: chronically ill appearing, obese female. Cardio: RRR, no murmurs Pulmonary: CTAB, normal WOB on room air MSK: She has arthritic changes to several of the MCP joints of bilateral hands and bilateral knees.  Gait is antalgic.  Utilizing cane for ambulation.  She is stable but does require frequent breaks.  She has somewhat a difficult time getting up from a seated position.  However, she is able to do this independently  Advanced Directives 06/16/2020 10/24/2019 06/25/2019  Does Patient Have a Medical Advance Directive? No No No  Would patient like information on creating a medical advance directive? No - Patient declined - No - Patient declined    Current Medications (verified) Outpatient Encounter Medications as of 06/16/2020  Medication Sig   amitriptyline (ELAVIL) 25 MG tablet Take 1 tablet (25 mg total) by mouth at bedtime.   HUMIRA PEN 40 MG/0.4ML PNKT INJECT 40 MG INTO THE SKIN EVERY 14 (FOURTEEN) DAYS.   losartan-hydrochlorothiazide (HYZAAR) 100-12.5 MG tablet Take 1 tablet  by mouth daily.   [DISCONTINUED] folic acid (FOLVITE) 1 MG tablet Take 2 tablets (2 mg total) by mouth daily.   No facility-administered encounter medications on file as of 06/16/2020.    Allergies (verified) Patient has no known allergies.   History: Past Medical History:  Diagnosis Date   Arthritis    Depression    Hypertension    Rheumatoid arthritis ([place])    Past Surgical History:  Procedure Laterality Date   BREAST SURGERY     nodules removed   COLONOSCOPY N/A 06/25/2019   Procedure: COLONOSCOPY;  Surgeon: Danie Binder, MD;  [facility];  Service: Endoscopy;  Laterality: N/A;  1:30   POLYPECTOMY  06/25/2019   Procedure: POLYPECTOMY;  Surgeon: Danie Binder, MD;  [facility];  Service: Endoscopy;;   Family History  Problem Relation Age of Onset   Cancer Father    Colon cancer Father    Thyroid disease Mother    CVA Mother 68   Thyroid disease Sister    Healthy Son    Congestive Heart Failure Sister    Hypertension Sister    Diabetes Sister    Hypertension Sister    Diabetes Sister    Hypertension Sister    Hypertension Sister    Cancer Brother        kidney    Social History   Socioeconomic History   Marital status: Widowed    Spouse name: Not on  file   Number of children: 1   Years of education: Not on file   Highest education level: GED or equivalent  Occupational History   Not on file  Tobacco Use   Smoking status: Former Smoker    Packs/day: 1.00    Years: 15.00    Pack years: 15.00    Types: Cigarettes    Quit date: 2000    Years since quitting: 21.5   Smokeless tobacco: Never Used  Scientific laboratory technician Use: Never used  Substance and Sexual Activity   Alcohol use: Not Currently   Drug use: Never   Sexual activity: Not Currently    Comment: widowed  Other Topics Concern   Not on file  Social History Narrative   Patient is a widow that resides independently in [place].  She  has 1 son, who resides in [place].   Social Determinants of Health   Financial Resource Strain:    Difficulty of Paying Living Expenses:   Food Insecurity:    Worried About Charity fundraiser in the Last Year:    Arboriculturist in the Last Year:   Transportation Needs:    Film/video editor (Medical):    Lack of Transportation (Non-Medical):   Physical Activity:    Days of Exercise per Week:    Minutes of Exercise per Session:   Stress:    Feeling of Stress :   Social Connections:    Frequency of Communication with Friends and Family:    Frequency of Social Gatherings with Friends and Family:    Attends Religious Services:    Active Member of Clubs or Organizations:    Attends Music therapist:    Marital Status:     Tobacco Counseling Counseling given: Not Answered   Clinical Intake:  Pre-visit preparation completed: No  Pain : 0-10 Pain Score: 7  Pain Type: Chronic pain Pain Location:  (all joints/arthritis) Pain Descriptors / Indicators: Aching Pain Onset: More than a month ago Pain Frequency: Constant Pain Relieving Factors: nothing  Pain Relieving Factors: nothing  BMI - recorded: 40 Nutritional Status: BMI > 30  Obese Nutritional Risks: None Diabetes: No  What is the last grade level you completed in school?: GED  Diabetic? NO  Interpreter Needed?: No  Information entered by :: Gina Rintelmann,LPN   Activities of Daily Living In your present state of health, do you have any difficulty performing the following activities: 06/16/2020  Hearing? N  Vision? N  Difficulty concentrating or making decisions? N  Walking or climbing stairs? Y  Comment due to joint pain  Dressing or bathing? N  Doing errands, shopping? N  Preparing Food and eating ? Y  Using the Toilet? N  In the past six months, have you accidently leaked urine? N  Do you have problems with loss of bowel control? N  Managing your Medications? Y    Managing your Finances? N  Housekeeping or managing your Housekeeping? Y  Some recent data might be hidden    Patient Care Team: [name], DO as PCP - General (Family Medicine) Gala Romney Cristopher Estimable, MD as Consulting Physician (Gastroenterology) Bo Merino, MD as Consulting Physician (Rheumatology)  Indicate any recent Medical Services you may have received from other than [facility] providers in the past year (date may be approximate).     Assessment:   This is a routine wellness examination for Martin.  Hearing/Vision screen  Hearing Screening   125Hz  250Hz  500Hz   1000Hz  2000Hz  3000Hz  4000Hz  6000Hz  8000Hz   Right ear:           Left ear:           Comments: No difficulty  Vision Screening Comments: Wears glasses, no problems  Dietary issues and exercise activities discussed: Current Exercise Habits: The patient does not participate in regular exercise at present, Exercise limited by: orthopedic condition(s)  Goals     DIET - EAT MORE FRUITS AND VEGETABLES     Prevent falls      Depression Screen PHQ 2/9 Scores 06/16/2020 05/14/2019 02/12/2019 01/08/2019  PHQ - 2 Score 4 0 0 0  PHQ- 9 Score 7 0 0 0    Fall Risk Fall Risk  06/16/2020 05/14/2019 01/08/2019  Falls in the past year? 0 0 0    Any stairs in or around the home? at her personal home but she is staying with niece and is on ground floor If so, are there any without handrails? No  Home free of loose throw rugs in walkways, pet beds, electrical cords, etc? Yes  Adequate lighting in your home to reduce risk of falls? Yes   ASSISTIVE DEVICES UTILIZED TO PREVENT FALLS:  Life alert? No  Use of a cane, walker or w/c? Yes  Grab bars in the bathroom? No  Shower chair or bench in shower? No  Elevated toilet seat or a handicapped toilet? No   TIMED UP AND GO:  Was the test performed? Yes .  Length of time to ambulate 10 feet: 49 sec.   Gait slow and steady with assistive device  Cognitive Function: MMSE -  Mini Mental State Exam 06/16/2020  Orientation to time 5  Orientation to Place 5  Registration 3  Attention/ Calculation 5  Recall 3  Language- name 2 objects 2  Language- repeat 1  Language- follow 3 step command 3  Language- read & follow direction 1  Write a sentence 1  Copy design 1  Total score 30       Immunizations Immunization History  Administered Date(s) Administered   Influenza, Quadrivalent, Recombinant, Inj, Pf 09/19/2019   PFIZER SARS-COV-2 Vaccination 05/23/2020, 06/13/2020    TDAP status: Due, Education has been provided regarding the importance of this vaccine. Advised may receive this vaccine at local pharmacy or Health Dept. Aware to provide a copy of the vaccination record if obtained from local pharmacy or Health Dept. Verbalized acceptance and understanding. Flu Vaccine status: Up to date Pneumococcal vaccine status: Declined,  Education has been provided regarding the importance of this vaccine but patient still declined. Advised may receive this vaccine at local pharmacy or Health Dept. Aware to provide a copy of the vaccination record if obtained from local pharmacy or Health Dept. Verbalized acceptance and understanding.  Covid-19 vaccine status: Completed vaccines  Qualifies for Shingles Vaccine? Yes   Zostavax completed No   Shingrix Completed?: No.    Education has been provided regarding the importance of this vaccine. Patient has been advised to call insurance company to determine out of pocket expense if they have not yet received this vaccine. Advised may also receive vaccine at local pharmacy or Health Dept. Verbalized acceptance and understanding.  Screening Tests Health Maintenance  Topic Date Due   PAP SMEAR-Modifier  Never done   DEXA SCAN  Never done   TETANUS/TDAP  06/16/2021 (Originally 07/12/1975)   PNA vac Low Risk Adult (1 of 2 - PCV13) 06/16/2021 (Originally 07/13/2019)   INFLUENZA VACCINE  07/06/2020  MAMMOGRAM  01/30/2021    COLONOSCOPY  06/24/2029   COVID-19 Vaccine  Completed   Hepatitis C Screening  Completed   HIV Screening  Completed    Health Maintenance  Health Maintenance Due  Topic Date Due   PAP SMEAR-Modifier  Never done   DEXA SCAN  Never done    Colorectal cancer screening: Completed .Marland Kitchen Repeat every 10 years Mammogram status: Completed 06/29/20. Repeat every year Bone Density status: Ordered 06/16/2020.  Needs due to RA.Marland Kitchen Pt provided with contact info and advised to call to schedule appt.  Lung Cancer Screening: (Low Dose CT Chest recommended if Age 98-80 years, 30 pack-year currently smoking OR have quit w/in 15years.) does not qualify.   Lung Cancer Screening Referral: n/a  Additional Screening:  Hepatitis C Screening: does not qualify; Completed already  Vision Screening: Recommended annual ophthalmology exams for early detection of glaucoma and other disorders of the eye. Is the patient up to date with their annual eye exam?  Yes  Who is the provider or what is the name of the office in which the patient attends annual eye exams? Dr Marin Comment If pt is not established with a provider, would they like to be referred to a provider to establish care? n/a.   Dental Screening: Recommended annual dental exams for proper oral hygiene  Community Resource Referral / Chronic Care Management: CRR required this visit?  No   CCM required this visit?  No      Plan:      1. Welcome to Medicare preventive visit  2. Full code status Reviewed with patient.  She does not wish to put this in writing but does wish to be full code with the family to determine whether or not to prolong life pending nature of event  3. Rheumatoid arthritis involving multiple sites with positive rheumatoid factor ([place]) Continue to follow with rheumatology as scheduled.  Continue Tylenol.  4. Sciatica of left side Gabapentin added today.  Instructions for use discussed.  Caution sedation - gabapentin (NEURONTIN)  100 MG capsule; Take 1 capsule (100 mg total) by mouth at bedtime for 7 days, THEN 2 capsules (200 mg total) at bedtime for 7 days, THEN 3 capsules (300 mg total) at bedtime.  Dispense: 90 capsule; Refill: 3  5. Impaired mobility and endurance Utilizing cane/walker. No falls   I have personally reviewed and noted the following in the patients chart:    Medical and social history  Use of alcohol, tobacco or illicit drugs   Current medications and supplements  Functional ability and status  Nutritional status  Physical activity  Advanced directives  List of other physicians  Hospitalizations, surgeries, and ER visits in previous 12 months  Vitals  Screenings to include cognitive, depression, and falls  Referrals and appointments  In addition, I have reviewed and discussed with patient certain preventive protocols, quality metrics, and best practice recommendations. A written personalized care plan for preventive services as well as general preventive health recommendations were provided to patient.     Ronnie Doss, DO   06/16/2020

## 2020-06-17 NOTE — Progress Notes (Signed)
Order in chart - patient on list to contact when DXA is back up

## 2020-06-18 ENCOUNTER — Encounter: Payer: Self-pay | Admitting: Rheumatology

## 2020-06-18 ENCOUNTER — Other Ambulatory Visit: Payer: Self-pay

## 2020-06-18 ENCOUNTER — Ambulatory Visit (INDEPENDENT_AMBULATORY_CARE_PROVIDER_SITE_OTHER): Payer: Medicare Other | Admitting: Rheumatology

## 2020-06-18 VITALS — BP 120/84 | HR 87 | Resp 18 | Ht 63.0 in | Wt 219.4 lb

## 2020-06-18 DIAGNOSIS — Z79899 Other long term (current) drug therapy: Secondary | ICD-10-CM

## 2020-06-18 DIAGNOSIS — R768 Other specified abnormal immunological findings in serum: Secondary | ICD-10-CM | POA: Diagnosis not present

## 2020-06-18 DIAGNOSIS — M0579 Rheumatoid arthritis with rheumatoid factor of multiple sites without organ or systems involvement: Secondary | ICD-10-CM

## 2020-06-18 DIAGNOSIS — M170 Bilateral primary osteoarthritis of knee: Secondary | ICD-10-CM | POA: Diagnosis not present

## 2020-06-18 DIAGNOSIS — Z87891 Personal history of nicotine dependence: Secondary | ICD-10-CM

## 2020-06-18 DIAGNOSIS — R5383 Other fatigue: Secondary | ICD-10-CM

## 2020-06-18 DIAGNOSIS — Z8349 Family history of other endocrine, nutritional and metabolic diseases: Secondary | ICD-10-CM

## 2020-06-18 DIAGNOSIS — Z8659 Personal history of other mental and behavioral disorders: Secondary | ICD-10-CM

## 2020-06-18 DIAGNOSIS — Z800 Family history of malignant neoplasm of digestive organs: Secondary | ICD-10-CM

## 2020-06-18 DIAGNOSIS — I10 Essential (primary) hypertension: Secondary | ICD-10-CM

## 2020-06-18 MED ORDER — PREDNISONE 5 MG PO TABS
ORAL_TABLET | ORAL | 0 refills | Status: DC
Start: 2020-06-18 — End: 2020-06-19

## 2020-06-18 MED FILL — predniSONE 5 MG TABS: 5 | 16 days supply | Qty: 40 | Fill #0

## 2020-06-18 NOTE — Telephone Encounter (Signed)
Patient will start arava pending lab results, per Dr. Estanislado Pandy. Consent obtained and sent to the scan center.

## 2020-06-18 NOTE — Patient Instructions (Signed)
Standing Labs We placed an order today for your standing lab work.   Please have your standing labs drawn in 2 weeks after starting leflunomide.  If possible, please have your labs drawn 2 weeks prior to your appointment so that the provider can discuss your results at your appointment.  We have open lab daily Monday through Thursday from 8:30-12:30 PM and 1:30-4:30 PM and Friday from 8:30-12:30 PM and 1:30-4:00 PM at the office of Dr. Bo Merino, [facility].   Please be advised, patients with office appointments requiring lab work will take precedents over walk-in lab work.  If possible, please come for your lab work on Monday and Friday afternoons, as you may experience shorter wait times. The office is located at [address] [place], [place], [place] [address] No appointment is necessary.   Labs are drawn by [facility]. Please bring your co-pay at the time of your lab draw.  You may receive a bill from [facility] for your lab work.  If you wish to have your labs drawn at another location, please call the office 24 hours in advance to send orders.  If you have any questions regarding directions or hours of operation,  please call [phone].   As a reminder, please drink plenty of water prior to coming for your lab work. Thanks!   Leflunomide tablets What is this medicine? LEFLUNOMIDE (le FLOO na mide) is for rheumatoid arthritis. This medicine may be used for other purposes; ask your health care provider or pharmacist if you have questions. COMMON BRAND NAME(S): Arava What should I tell my health care provider before I take this medicine? They need to know if you have any of these conditions:  diabetes  have a fever or infection  high blood pressure  immune system problems  kidney disease  liver disease  low blood cell counts, like low white cell, platelet, or red cell counts  lung or breathing disease, like asthma  recently received or scheduled to  receive a vaccine  receiving treatment for cancer  skin conditions or sensitivity  tingling of the fingers or toes, or other nerve disorder  tuberculosis  an unusual or allergic reaction to leflunomide, teriflunomide, other medicines, food, dyes, or preservatives  pregnant or trying to get pregnant  breast-feeding How should I use this medicine? Take this medicine by mouth with a full glass of water. Follow the directions on the prescription label. Take your medicine at regular intervals. Do not take your medicine more often than directed. Do not stop taking except on your doctor's advice. Talk to your pediatrician regarding the use of this medicine in children. Special care may be needed. Overdosage: If you think you have taken too much of this medicine contact a poison control center or emergency room at once. NOTE: This medicine is only for you. Do not share this medicine with others. What if I miss a dose? If you miss a dose, take it as soon as you can. If it is almost time for your next dose, take only that dose. Do not take double or extra doses. What may interact with this medicine? Do not take this medicine with any of the following medications:  teriflunomide This medicine may also interact with the following medications:  alosetron  birth control pills  caffeine  cefaclor  certain medicines for diabetes like nateglinide, repaglinide, rosiglitazone, pioglitazone  certain medicines for high cholesterol like atorvastatin, pravastatin, rosuvastatin, simvastatin  charcoal  cholestyramine  ciprofloxacin  duloxetine  furosemide  ketoprofen  live virus vaccines  medicines that increase your risk for infection  methotrexate  mitoxantrone  paclitaxel  penicillin  theophylline  tizanidine  warfarin This list may not describe all possible interactions. Give your health care provider a list of all the medicines, herbs, non-prescription drugs, or  dietary supplements you use. Also tell them if you smoke, drink alcohol, or use illegal drugs. Some items may interact with your medicine. What should I watch for while using this medicine? Visit your health care provider for regular checks on your progress. Tell your doctor or health care provider if your symptoms do not start to get better or if they get worse. You may need blood work done while you are taking this medicine. This medicine may cause serious skin reactions. They can happen weeks to months after starting the medicine. Contact your health care provider right away if you notice fevers or flu-like symptoms with a rash. The rash may be red or purple and then turn into blisters or peeling of the skin. Or, you might notice a red rash with swelling of the face, lips or lymph nodes in your neck or under your arms. This medicine may stay in your body for up to 2 years after your last dose. Tell your doctor about any unusual side effects or symptoms. A medicine can be given to help lower your blood levels of this medicine more quickly. Women must use effective birth control with this medicine. There is a potential for serious side effects to an unborn child. Do not become pregnant while taking this medicine. Inform your doctor if you wish to become pregnant. This medicine remains in your blood after you stop taking it. You must continue using effective birth control until the blood levels have been checked and they are low enough. A medicine can be given to help lower your blood levels of this medicine more quickly. Immediately talk to your doctor if you think you may be pregnant. You may need a pregnancy test. Talk to your health care provider or pharmacist for more information. You should not receive certain vaccines during your treatment and for a certain time after your treatment with this medication ends. Talk to your health care provider for more information. What side effects may I notice from  receiving this medicine? Side effects that you should report to your doctor or health care professional as soon as possible:  allergic reactions like skin rash, itching or hives, swelling of the face, lips, or tongue  breathing problems  cough  increased blood pressure  low blood counts - this medicine may decrease the number of white blood cells and platelets. You may be at increased risk for infections and bleeding.  pain, tingling, numbness in the hands or feet  rash, fever, and swollen lymph nodes  redness, blistering, peeing or loosening of the skin, including inside the mouth  signs of decreased platelets or bleeding - bruising, pinpoint red spots on the skin, black, tarry stools, blood in urine  signs of infection - fever or chills, cough, sore throat, pain or trouble passing urine  signs and symptoms of liver injury like dark yellow or brown urine; general ill feeling or flu-like symptoms; light-colored stools; loss of appetite; nausea; right upper belly pain; unusually weak or tired; yellowing of the eyes or skin  trouble passing urine or change in the amount of urine  vomiting Side effects that usually do not require medical attention (report to your doctor or health care  professional if they continue or are bothersome):  diarrhea  hair thinning or loss  headache  nausea  tiredness This list may not describe all possible side effects. Call your doctor for medical advice about side effects. You may report side effects to FDA at 1-800-FDA-1088. Where should I keep my medicine? Keep out of the reach of children. Store at room temperature between 15 and 30 degrees C (59 and 86 degrees F). Protect from moisture and light. Throw away any unused medicine after the expiration date. NOTE: This sheet is a summary. It may not cover all possible information. If you have questions about this medicine, talk to your doctor, pharmacist, or health care provider.  2020  Elsevier/Gold Standard (2019-02-23 15:06:48)

## 2020-06-19 ENCOUNTER — Telehealth: Payer: Self-pay | Admitting: Rheumatology

## 2020-06-19 MED ORDER — PREDNISONE 5 MG PO TABS: ORAL_TABLET | ORAL | 0 refills | Status: DC

## 2020-06-19 NOTE — Telephone Encounter (Signed)
Patient advised prescription for prednisone was resent to [facility]. Patient advised we are awaiting lab results before we can send in the Leflunomide.

## 2020-06-19 NOTE — Telephone Encounter (Signed)
Patient called stating her prescription of Prednisone and Leflunomide need to be sent to [facility] in [place].  Patient requested a return call.

## 2020-06-24 ENCOUNTER — Telehealth: Payer: Self-pay | Admitting: Rheumatology

## 2020-06-24 NOTE — Telephone Encounter (Signed)
Lab results received  Albumin 3.4 Globulin 4.7 Albumin/Globulin Ratio 0.7 Hgb: 11.1 MCH 26.1 MCHC 31.4 RDW 16.5 Platelet 526  Per office note on 06/18/2020: leflunomide 10 mg p.o. daily and will increase it to 20 mg p.o. daily at the follow-up visit

## 2020-06-24 NOTE — Telephone Encounter (Signed)
Patient advised we received her results in the office today. Patient advised they will be reviewed by Dr. Estanislado Pandy and then the prescription will be sent. Patient advised will call once Dr. Estanislado Pandy has reviewed labs to review with her and to advise when the prescription has been sent.

## 2020-06-24 NOTE — Telephone Encounter (Signed)
Patient called to see if we received her labwork results so her prescription of Leflunomide can be sent to the pharmacy.

## 2020-06-25 MED ORDER — LEFLUNOMIDE 10 MG PO TABS
10.0000 mg | ORAL_TABLET | Freq: Every day | ORAL | 0 refills | Status: DC
Start: 2020-06-25 — End: 2020-07-24

## 2020-07-01 ENCOUNTER — Other Ambulatory Visit: Payer: Self-pay | Admitting: Physician Assistant

## 2020-07-01 DIAGNOSIS — M0579 Rheumatoid arthritis with rheumatoid factor of multiple sites without organ or systems involvement: Secondary | ICD-10-CM

## 2020-07-01 NOTE — Telephone Encounter (Signed)
Last Visit: 06/18/2020  Next Visit: 07/23/2020 Labs: 06/18/2020 Albumin 3.4 Globulin 4.7 Albumin/Globulin Ratio 0.7 Hgb: 11.1 MCH 26.1 MCHC 31.4 RDW 16.5 Platelet 526  TB Gold: 02/04/2020 Neg   Current Dose per office note 06/18/2020: Humira 40 mg every 14 days  TX:[id number] arthritis involving multiple sites with positive rheumatoid factor   Okay to refill Humira?

## 2020-07-09 MED FILL — HUMIRA PEN 40 MG/0.4ML PNKT: 40 | 28 days supply | Qty: 2 | Fill #0

## 2020-07-10 ENCOUNTER — Telehealth: Payer: Self-pay | Admitting: Rheumatology

## 2020-07-10 DIAGNOSIS — Z79899 Other long term (current) drug therapy: Secondary | ICD-10-CM

## 2020-07-10 NOTE — Telephone Encounter (Signed)
Patient going to [facility] this coming week for draw. Please release orders.

## 2020-07-10 NOTE — Telephone Encounter (Signed)
Lab Orders released.  

## 2020-07-11 NOTE — Progress Notes (Signed)
Office Visit Note  Patient: Jean Howell             Date of Birth: May 13, 1956           MRN: [id number]             PCP: [name], DO Referring: [name], DO Visit Date: 07/23/2020 Occupation: @GUAROCC @  Subjective:  Medication management.   History of Present Illness: Rosalba Totty is a 64 y.o. female with history of rheumatoid arthritis and osteoarthritis. She states she has been having increased pain in her right knee joint for the last few days which is subsiding gradually. She has been taking Arava and Humira on a regular basis. She denies any joint swelling currently. She has been tolerating her medications well. She has had both COVID-19 vaccinations.  Activities of Daily Living:  Patient reports morning stiffness for several  hours.   Patient Reports nocturnal pain.  Difficulty dressing/grooming: Denies Difficulty climbing stairs: Reports Difficulty getting out of chair: Reports Difficulty using hands for taps, buttons, cutlery, and/or writing: Reports  Review of Systems  Constitutional: Negative for fatigue.  HENT: Negative for mouth sores and mouth dryness.   Eyes: Negative for itching and dryness.  Respiratory: Negative for shortness of breath and difficulty breathing.   Cardiovascular: Negative for chest pain and palpitations.  Gastrointestinal: Positive for constipation. Negative for blood in stool and diarrhea.  Endocrine: Negative for increased urination.  Genitourinary: Negative for difficulty urinating.  Musculoskeletal: Positive for arthralgias, joint pain, joint swelling, morning stiffness and muscle tenderness.  Skin: Negative for color change, rash and redness.  Allergic/Immunologic: Negative for susceptible to infections.  Neurological: Positive for numbness. Negative for dizziness, headaches, memory loss and weakness.  Hematological: Negative for bruising/bleeding tendency.  Psychiatric/Behavioral: Negative for confusion.     PMFS History:  Patient Active Problem List   Diagnosis Date Noted  . Full code status 06/16/2020  . Impaired mobility and endurance 06/16/2020  . Sciatica of left side 06/16/2020  . Screen for colon cancer 03/17/2020  . Rheumatoid arthritis involving multiple sites with positive rheumatoid factor ([place]) 03/06/2019  . Primary osteoarthritis of both knees 03/06/2019  . Former smoker 03/06/2019  . ANA positive 02/12/2019  . Family history of thyroid disease 01/08/2019  . Morbid obesity ([place]) 01/08/2019  . Essential hypertension 01/08/2019  . Polyarthralgia 01/08/2019  . Family history of colon cancer 01/08/2019    Past Medical History:  Diagnosis Date  . Arthritis   . Depression   . Hypertension   . Rheumatoid arthritis ([place])     Family History  Problem Relation Age of Onset  . Cancer Father   . Colon cancer Father   . Thyroid disease Mother   . CVA Mother 14  . Thyroid disease Sister   . Healthy Son   . Congestive Heart Failure Sister   . Hypertension Sister   . Diabetes Sister   . Hypertension Sister   . Diabetes Sister   . Hypertension Sister   . Hypertension Sister   . Cancer Brother        kidney    Past Surgical History:  Procedure Laterality Date  . BREAST SURGERY     nodules removed  . COLONOSCOPY N/A 06/25/2019   Procedure: COLONOSCOPY;  Surgeon: Danie Binder, MD;  [facility];  Service: Endoscopy;  Laterality: N/A;  1:30  . POLYPECTOMY  06/25/2019   Procedure: POLYPECTOMY;  Surgeon: Danie Binder, MD;  Location: [facility];  Service: Endoscopy;;   Social History   Social History Narrative   Patient is a widow that resides independently in [place].  She has 1 son, who resides in [place].   Immunization History  Administered Date(s) Administered  . Influenza, Quadrivalent, Recombinant, Inj, Pf 09/19/2019  . PFIZER SARS-COV-2 Vaccination 05/23/2020, 06/13/2020     Objective: Vital Signs: BP 125/77 (BP Location: Right  Arm, Patient Position: Sitting, Cuff Size: Normal)   Pulse 99   Resp 15   Ht 5\' 3"  (1.6 m)   Wt 232 lb (105.2 kg)   BMI 41.10 kg/m    Physical Exam Vitals and nursing note reviewed. Chaperone present: uses walker for mobility.  Constitutional:      Appearance: She is well-developed.  HENT:     Head: Normocephalic and atraumatic.  Eyes:     Conjunctiva/sclera: Conjunctivae normal.  Cardiovascular:     Rate and Rhythm: Normal rate and regular rhythm.     Heart sounds: Normal heart sounds.  Pulmonary:     Effort: Pulmonary effort is normal.     Breath sounds: Normal breath sounds.  Abdominal:     General: Bowel sounds are normal.     Palpations: Abdomen is soft.  Musculoskeletal:     Cervical back: Normal range of motion.  Lymphadenopathy:     Cervical: No cervical adenopathy.  Skin:    General: Skin is warm and dry.     Capillary Refill: Capillary refill takes less than 2 seconds.  Neurological:     Mental Status: She is alert and oriented to person, place, and time.  Psychiatric:        Behavior: Behavior normal.      Musculoskeletal Exam: She has some limitation range of motion of her cervical spine.  She had discomfort range of motion of her shoulder joints.  Elbow joints were in good range of motion.  She had good range of motion of wrist joints.  She is synovial thickening over bilateral second and third MCP joint but no synovitis was noted.  Hip joints were difficult to assess in the sitting position.  Knee joints were in good range of motion.  She is some warmth on palpation of her right knee joint.  There was no tenderness over ankle joints and MTPs.  CDAI Exam: CDAI Score: 4.2  Patient Global: 7 mm; Provider Global: 5 mm Swollen: 1 ; Tender: 2  Joint Exam 07/23/2020      Right  Left  Knee  Swollen Tender   Tender     Investigation: No additional findings.  Imaging: No results found.  Recent Labs: Lab Results  Component Value Date   WBC 5.5  07/17/2020   HGB 12.6 07/17/2020   PLT 299 07/17/2020   NA 139 07/17/2020   K 4.2 07/17/2020   CL 100 07/17/2020   CO2 25 07/17/2020   GLUCOSE 94 07/17/2020   BUN 20 07/17/2020   CREATININE 1.12 (H) 07/17/2020   BILITOT 0.3 07/17/2020   ALKPHOS 89 07/17/2020   AST 21 07/17/2020   ALT 30 07/17/2020   PROT 6.6 07/17/2020   ALBUMIN 3.3 (L) 07/17/2020   CALCIUM 9.6 07/17/2020   GFRAA 59 (L) 07/17/2020   QFTBGOLDPLUS NEGATIVE 02/04/2020    Speciality Comments: PLQ Eye Exam: 12/16/2018 WNL @ Dr Anthony Sar Follow up in 1 year  Procedures:  No procedures performed Allergies: Patient has no known allergies.   Assessment / Plan:     Visit Diagnoses: Rheumatoid arthritis involving multiple  sites with positive rheumatoid factor ([facility]) - Positive RF, +14-3-3 eta, Positive ANA, erosive inflammatory arthritis.  She had recent flare with right knee joint pain and discomfort.  The symptoms are gradually getting better.  None of the other joints were swollen today on examination.  She has been on Humira and Arava combination which has been working quite well for her.  High risk medication use - Arava + Humira 40 mg every 14 days,( methotrexate 6 tablets every 7 days was discontinued due to elevated creatinine).  She has low GFR but the labs are stable.  We will continue to monitor labs every 3 months.  TB gold was in March 2021.  Acute pain of right knee-she has been having pain and swelling in her right knee joint recently.  She states her swelling is gradually subsiding.  We will avoid cortisone injection.  Have advised her to use Aspercreme topically.  Primary osteoarthritis and rheumatoid arthritis of both knees she has severe osteoarthritis and moderate chondromalacia patella in her knee joints.  She has chronic discomfort in her knee joints.  ANA positive-she has no clinical features of lupus.  She denies any history of oral ulcers, nasal ulcers, malar rash, photosensitivity, sicca symptoms or  Raynaud's phenomenon.  Other fatigue-due to chronic disease.  Essential hypertension-blood pressure was within her normal limits today.  History of depression  Former smoker  Family history of thyroid disease  Family history of colon cancer  Osteoporosis screening-she is not sure if she has had a DEXA scan.  Have advised her to discuss this further with her PCP.  I could not find a DEXA scan in epic.  Patient has received both COVID-19 vaccinations.  I have advised her to continue to wear the mask, practice social distancing and hand hygiene.  She was also advised to get the booster COVID-19 vaccination.  She may be a candidate for monoclonal antibody infusion in case she develops COVID-19 infection.  All the instructions were placed in the AVS.  Orders: No orders of the defined types were placed in this encounter.  No orders of the defined types were placed in this encounter.     Follow-Up Instructions: Return in about 5 months (around 12/23/2020) for Rheumatoid arthritis, Osteoarthritis.   Bo Merino, MD  Note - This record has been created using Editor, commissioning.  Chart creation errors have been sought, but may not always  have been located. Such creation errors do not reflect on  the standard of medical care.

## 2020-07-17 ENCOUNTER — Other Ambulatory Visit: Payer: Medicare Other

## 2020-07-17 ENCOUNTER — Other Ambulatory Visit: Payer: Self-pay

## 2020-07-17 DIAGNOSIS — Z79899 Other long term (current) drug therapy: Secondary | ICD-10-CM | POA: Diagnosis not present

## 2020-07-18 LAB — CBC WITH DIFFERENTIAL/PLATELET
Basophils Absolute: 0.1 10*3/uL (ref 0.0–0.2)
Basos: 1 %
EOS (ABSOLUTE): 0.3 10*3/uL (ref 0.0–0.4)
Eos: 5 %
Hematocrit: 37.7 % (ref 34.0–46.6)
Hemoglobin: 12.6 g/dL (ref 11.1–15.9)
Immature Grans (Abs): 0 10*3/uL (ref 0.0–0.1)
Immature Granulocytes: 0 %
Lymphocytes Absolute: 2 10*3/uL (ref 0.7–3.1)
Lymphs: 37 %
MCH: 27.8 pg (ref 26.6–33.0)
MCHC: 33.4 g/dL (ref 31.5–35.7)
MCV: 83 fL (ref 79–97)
Monocytes Absolute: 0.4 10*3/uL (ref 0.1–0.9)
Monocytes: 7 %
Neutrophils Absolute: 2.7 10*3/uL (ref 1.4–7.0)
Neutrophils: 50 %
Platelets: 299 10*3/uL (ref 150–450)
RBC: 4.54 x10E6/uL (ref 3.77–5.28)
RDW: 17.9 % — ABNORMAL HIGH (ref 11.7–15.4)
WBC: 5.5 10*3/uL (ref 3.4–10.8)

## 2020-07-18 LAB — CMP14+EGFR
ALT: 30 IU/L (ref 0–32)
AST: 21 IU/L (ref 0–40)
Albumin/Globulin Ratio: 1 — ABNORMAL LOW (ref 1.2–2.2)
Albumin: 3.3 g/dL — ABNORMAL LOW (ref 3.8–4.8)
Alkaline Phosphatase: 89 IU/L (ref 48–121)
BUN/Creatinine Ratio: 18 (ref 12–28)
BUN: 20 mg/dL (ref 8–27)
Bilirubin Total: 0.3 mg/dL (ref 0.0–1.2)
CO2: 25 mmol/L (ref 20–29)
Calcium: 9.6 mg/dL (ref 8.7–10.3)
Chloride: 100 mmol/L (ref 96–106)
Creatinine, Ser: 1.12 mg/dL — ABNORMAL HIGH (ref 0.57–1.00)
GFR calc Af Amer: 59 mL/min/{1.73_m2} — ABNORMAL LOW (ref >59)
GFR calc non Af Amer: 51 mL/min/{1.73_m2} — ABNORMAL LOW (ref >59)
Globulin, Total: 3.3 g/dL (ref 1.5–4.5)
Glucose: 94 mg/dL (ref 65–99)
Potassium: 4.2 mmol/L (ref 3.5–5.2)
Sodium: 139 mmol/L (ref 134–144)
Total Protein: 6.6 g/dL (ref 6.0–8.5)

## 2020-07-18 NOTE — Telephone Encounter (Signed)
Labs are stable.

## 2020-07-23 ENCOUNTER — Ambulatory Visit (INDEPENDENT_AMBULATORY_CARE_PROVIDER_SITE_OTHER): Payer: Medicare Other | Admitting: Rheumatology

## 2020-07-23 ENCOUNTER — Encounter: Payer: Self-pay | Admitting: Physician Assistant

## 2020-07-23 ENCOUNTER — Other Ambulatory Visit: Payer: Self-pay

## 2020-07-23 VITALS — BP 125/77 | HR 99 | Resp 15 | Ht 63.0 in | Wt 232.0 lb

## 2020-07-23 DIAGNOSIS — Z800 Family history of malignant neoplasm of digestive organs: Secondary | ICD-10-CM

## 2020-07-23 DIAGNOSIS — M170 Bilateral primary osteoarthritis of knee: Secondary | ICD-10-CM

## 2020-07-23 DIAGNOSIS — R5383 Other fatigue: Secondary | ICD-10-CM | POA: Diagnosis not present

## 2020-07-23 DIAGNOSIS — M0579 Rheumatoid arthritis with rheumatoid factor of multiple sites without organ or systems involvement: Secondary | ICD-10-CM | POA: Diagnosis not present

## 2020-07-23 DIAGNOSIS — Z87891 Personal history of nicotine dependence: Secondary | ICD-10-CM

## 2020-07-23 DIAGNOSIS — R768 Other specified abnormal immunological findings in serum: Secondary | ICD-10-CM | POA: Diagnosis not present

## 2020-07-23 DIAGNOSIS — I10 Essential (primary) hypertension: Secondary | ICD-10-CM

## 2020-07-23 DIAGNOSIS — Z79899 Other long term (current) drug therapy: Secondary | ICD-10-CM

## 2020-07-23 DIAGNOSIS — Z8659 Personal history of other mental and behavioral disorders: Secondary | ICD-10-CM

## 2020-07-23 DIAGNOSIS — Z8349 Family history of other endocrine, nutritional and metabolic diseases: Secondary | ICD-10-CM

## 2020-07-23 DIAGNOSIS — M25561 Pain in right knee: Secondary | ICD-10-CM

## 2020-07-23 NOTE — Patient Instructions (Addendum)
COVID-19 vaccine recommendations:  ° °COVID-19 vaccine is recommended for everyone (unless you are allergic to a vaccine component), even if you are on a medication that suppresses your immune system.  ° °If you are on Methotrexate, Cellcept (mycophenolate), Rinvoq, Xeljanz, and Olumiant- hold the medication for 1 week after each vaccine. Hold Methotrexate for 2 weeks after the single dose COVID-19 vaccine.  ° °If you are on Orencia subcutaneous injection - hold medication one week prior to and one week after the first COVID-19 vaccine dose (only).  ° °If you are on Orencia IV infusions- time vaccination administration so that the first COVID-19 vaccination will occur four weeks after the infusion and postpone the subsequent infusion by one week.  ° °If you are on Cyclophosphamide or Rituxan infusions please contact your doctor prior to receiving the COVID-19 vaccine.  ° °Do not take Tylenol or any anti-inflammatory medications (NSAIDs) 24 hours prior to the COVID-19 vaccination.  ° °There is no direct evidence about the efficacy of the COVID-19 vaccine in individuals who are on medications that suppress the immune system.  ° °Even if you are fully vaccinated, and you are on any medications that suppress your immune system, please continue to wear a mask, maintain at least six feet social distance and practice hand hygiene.  ° °If you develop a COVID-19 infection, please contact your PCP or our office to determine if you need antibody infusion. ° °The booster vaccine is now available for immunocompromised patients. It is advised that if you had Pfizer vaccine you should get Pfizer booster.  If you had a Moderna vaccine then you should get a Moderna booster. Johnson and Johnson does not have a booster vaccine at this time. ° °Please see the following web sites for updated information.   ° °[email or web address]://[email or web address]/[email or web address]/0/Files/[email or web address]-Vaccination-Patient-Resources.pdf ° °[email or web address]://[email or web address]/About-[email or web address]-Releases/ID/1159 ° °Standing Labs °We placed an order today for your standing lab work.  ° °Please have your standing labs drawn in November and every 3 months  ° °If possible, please have your labs drawn 2 weeks prior to your appointment so that the provider can discuss your results at your appointment. ° °We have open lab daily °Monday through Thursday from 8:30-12:30 PM and 1:30-4:30 PM and Friday from 8:30-12:30 PM and 1:30-4:00 PM °at the office of Dr. Zakyah Yanes,  Rheumatology.   °Please be advised, patients with office appointments requiring lab work will take precedents over walk-in lab work.  °If possible, please come for your lab work on Monday and Friday afternoons, as you may experience shorter wait times. °The office is located at [address], [address], [place], [place] [address] °No appointment is necessary.   °Labs are drawn by [facility]. Please bring your co-pay at the time of your lab draw.  You may receive a bill from [facility] for your lab work. ° °If you wish to have your labs drawn at another location, please call the office 24 hours in advance to send orders. ° °If you have any questions regarding directions or hours of operation,  °please call [phone].   °As a reminder, please drink plenty of water prior to coming for your lab work. Thanks! ° ° °

## 2020-07-24 ENCOUNTER — Other Ambulatory Visit: Payer: Self-pay | Admitting: Physician Assistant

## 2020-07-24 LAB — CBC WITH DIFFERENTIAL/PLATELET
Absolute Monocytes: 274 cells/uL (ref 200–950)
Basophils Absolute: 50 cells/uL (ref 0–200)
Basophils Relative: 0.7 %
Eosinophils Absolute: 288 cells/uL (ref 15–500)
Eosinophils Relative: 4 %
HCT: 35.3 % (ref 35.0–45.0)
Hemoglobin: 11.1 g/dL — ABNORMAL LOW (ref 11.7–15.5)
Lymphs Abs: 2592 cells/uL (ref 850–3900)
MCH: 26.1 pg — ABNORMAL LOW (ref 27.0–33.0)
MCHC: 31.4 g/dL — ABNORMAL LOW (ref 32.0–36.0)
MCV: 83.1 fL (ref 80.0–100.0)
MPV: 9.1 fL (ref 7.5–12.5)
Monocytes Relative: 3.8 %
Neutro Abs: 3996 cells/uL (ref 1500–7800)
Neutrophils Relative %: 55.5 %
Platelets: 526 10*3/uL — ABNORMAL HIGH (ref 140–400)
RBC: 4.25 10*6/uL (ref 3.80–5.10)
RDW: 16.5 % — ABNORMAL HIGH (ref 11.0–15.0)
Total Lymphocyte: 36 %
WBC: 7.2 10*3/uL (ref 3.8–10.8)

## 2020-07-24 LAB — COMPLETE METABOLIC PANEL WITH GFR
AG Ratio: 0.7 (calc) — ABNORMAL LOW (ref 1.0–2.5)
ALT: 16 U/L (ref 6–29)
AST: 15 U/L (ref 10–35)
Albumin: 3.4 g/dL — ABNORMAL LOW (ref 3.6–5.1)
Alkaline phosphatase (APISO): 69 U/L (ref 37–153)
BUN: 20 mg/dL (ref 7–25)
CO2: 27 mmol/L (ref 20–32)
Calcium: 9.9 mg/dL (ref 8.6–10.4)
Chloride: 101 mmol/L (ref 98–110)
Creat: 0.99 mg/dL (ref 0.50–0.99)
GFR, Est African American: 69 mL/min/{1.73_m2} (ref >=60)
GFR, Est Non African American: 60 mL/min/{1.73_m2} (ref >=60)
Globulin: 4.7 g/dL (calc) — ABNORMAL HIGH (ref 1.9–3.7)
Glucose, Bld: 85 mg/dL (ref 65–99)
Potassium: 4.9 mmol/L (ref 3.5–5.3)
Sodium: 139 mmol/L (ref 135–146)
Total Bilirubin: 0.2 mg/dL (ref 0.2–1.2)
Total Protein: 8.1 g/dL (ref 6.1–8.1)

## 2020-07-24 NOTE — Telephone Encounter (Signed)
Last Visit: 07/23/2020 Next Visit: 1/19/202 Labs: 07/17/2020 Labs are stable  Current Dose per office note on 07/23/2020: no mentioned in note  Okay to refill Arava?

## 2020-07-24 NOTE — Progress Notes (Signed)
Drop in hemoglobin noted.  Please ask patient if she is having any symptoms of dark stools or GI bleed.  She should contact her PCP for evaluation.  CMP is a stable.

## 2020-07-25 ENCOUNTER — Telehealth: Payer: Self-pay | Admitting: Family Medicine

## 2020-07-25 NOTE — Telephone Encounter (Signed)
Spoke with pt and advised to take 3 capsules at bedtime since she had already titrated up to that dose. Pt voiced understanding.

## 2020-08-07 MED FILL — HUMIRA PEN 40 MG/0.4ML PNKT: 40 | 28 days supply | Qty: 2 | Fill #1

## 2020-09-03 MED FILL — HUMIRA PEN 40 MG/0.4ML PNKT: 40 | 28 days supply | Qty: 2 | Fill #2

## 2020-09-16 ENCOUNTER — Ambulatory Visit (INDEPENDENT_AMBULATORY_CARE_PROVIDER_SITE_OTHER): Payer: Medicare Other | Admitting: Family Medicine

## 2020-09-16 ENCOUNTER — Other Ambulatory Visit: Payer: Self-pay

## 2020-09-16 ENCOUNTER — Encounter: Payer: Self-pay | Admitting: Family Medicine

## 2020-09-16 ENCOUNTER — Ambulatory Visit (INDEPENDENT_AMBULATORY_CARE_PROVIDER_SITE_OTHER): Payer: Medicare Other

## 2020-09-16 VITALS — BP 138/85 | HR 88 | Temp 97.2°F | Ht 63.0 in | Wt 235.2 lb

## 2020-09-16 DIAGNOSIS — Z23 Encounter for immunization: Secondary | ICD-10-CM | POA: Diagnosis not present

## 2020-09-16 DIAGNOSIS — M5432 Sciatica, left side: Secondary | ICD-10-CM | POA: Diagnosis not present

## 2020-09-16 DIAGNOSIS — M0579 Rheumatoid arthritis with rheumatoid factor of multiple sites without organ or systems involvement: Secondary | ICD-10-CM | POA: Diagnosis not present

## 2020-09-16 DIAGNOSIS — M792 Neuralgia and neuritis, unspecified: Secondary | ICD-10-CM | POA: Diagnosis not present

## 2020-09-16 DIAGNOSIS — Z7409 Other reduced mobility: Secondary | ICD-10-CM | POA: Diagnosis not present

## 2020-09-16 DIAGNOSIS — Z780 Asymptomatic menopausal state: Secondary | ICD-10-CM

## 2020-09-16 DIAGNOSIS — I10 Essential (primary) hypertension: Secondary | ICD-10-CM

## 2020-09-16 DIAGNOSIS — M85851 Other specified disorders of bone density and structure, right thigh: Secondary | ICD-10-CM | POA: Diagnosis not present

## 2020-09-16 MED ORDER — GABAPENTIN 300 MG PO CAPS: 300.0000 mg | ORAL_CAPSULE | Freq: Every day | ORAL | 3 refills | Status: DC

## 2020-09-16 NOTE — Patient Instructions (Signed)
The Gabapentin has been changed to the 300mg  capsules.  TAKE ONE only at bedtime now.

## 2020-09-16 NOTE — Progress Notes (Signed)
Subjective: CC: follow up leg pain PCP: [name], DO FWY:OVZCHYIF Sassano is a 64 y.o. female presenting to clinic today for:  1.  Neuralgia, RA Patient notes that the neuralgia is getting better in her left leg.  She is up to 300 mg of gabapentin each night would like me to send in a new prescription with the updated dosing schedule.  She denies any excessive daytime sedation.  She certainly has had no falls.  No edema.  She continues to take Humira and Arava for her rheumatoid arthritis.  She continues to have some joint pain but she certainly mobile and able to ambulate with a cane now.  She also continues to use Elavil nightly  2.  Hypertension Patient is compliant with her Hyzaar.  No chest pain, shortness of breath, edema.  No refills needed at this time.  ROS: Per HPI  No Known Allergies Past Medical History:  Diagnosis Date  . Arthritis   . Depression   . Hypertension   . Rheumatoid arthritis ([place])     Current Outpatient Medications:  .  Acetaminophen (TYLENOL PO), Take 2 tablets by mouth every 8 (eight) hours as needed. , Disp: , Rfl:  .  amitriptyline (ELAVIL) 25 MG tablet, Take 1 tablet (25 mg total) by mouth at bedtime., Disp: 90 tablet, Rfl: 3 .  Docusate Calcium (STOOL SOFTENER PO), Take by mouth 2 (two) times daily., Disp: , Rfl:  .  gabapentin (NEURONTIN) 100 MG capsule, Take 1 capsule (100 mg total) by mouth at bedtime for 7 days, THEN 2 capsules (200 mg total) at bedtime for 7 days, THEN 3 capsules (300 mg total) at bedtime., Disp: 90 capsule, Rfl: 3 .  HUMIRA PEN 40 MG/0.4ML PNKT, INJECT 40 MG INTO THE SKIN EVERY 14 (FOURTEEN) DAYS., Disp: 6 each, Rfl: 0 .  leflunomide (ARAVA) 10 MG tablet, TAKE ONE (1) TABLET EACH DAY, Disp: 90 tablet, Rfl: 0 .  losartan-hydrochlorothiazide (HYZAAR) 100-12.5 MG tablet, Take 1 tablet by mouth daily., Disp: 90 tablet, Rfl: 3 Social History   Socioeconomic History  . Marital status: Widowed    Spouse name: Not on file    . Number of children: 1  . Years of education: Not on file  . Highest education level: GED or equivalent  Occupational History  . Not on file  Tobacco Use  . Smoking status: Former Smoker    Packs/day: 1.00    Years: 15.00    Pack years: 15.00    Types: Cigarettes    Quit date: 2000    Years since quitting: 21.7  . Smokeless tobacco: Never Used  Vaping Use  . Vaping Use: Never used  Substance and Sexual Activity  . Alcohol use: Not Currently  . Drug use: Never  . Sexual activity: Not Currently    Comment: widowed  Other Topics Concern  . Not on file  Social History Narrative   Patient is a widow that resides independently in [place].  She has 1 son, who resides in [place].   Social Determinants of Health   Financial Resource Strain:   . Difficulty of Paying Living Expenses: Not on file  Food Insecurity:   . Worried About Charity fundraiser in the Last Year: Not on file  . Ran Out of Food in the Last Year: Not on file  Transportation Needs:   . Lack of Transportation (Medical): Not on file  . Lack of Transportation (Non-Medical): Not on file  Physical Activity:   .  Days of Exercise per Week: Not on file  . Minutes of Exercise per Session: Not on file  Stress:   . Feeling of Stress : Not on file  Social Connections:   . Frequency of Communication with Friends and Family: Not on file  . Frequency of Social Gatherings with Friends and Family: Not on file  . Attends Religious Services: Not on file  . Active Member of Clubs or Organizations: Not on file  . Attends Archivist Meetings: Not on file  . Marital Status: Not on file  Intimate Partner Violence:   . Fear of Current or Ex-Partner: Not on file  . Emotionally Abused: Not on file  . Physically Abused: Not on file  . Sexually Abused: Not on file   Family History  Problem Relation Age of Onset  . Cancer Father   . Colon cancer Father   . Thyroid disease Mother   . CVA Mother 67  . Thyroid  disease Sister   . Healthy Son   . Congestive Heart Failure Sister   . Hypertension Sister   . Diabetes Sister   . Hypertension Sister   . Diabetes Sister   . Hypertension Sister   . Hypertension Sister   . Cancer Brother        kidney     Objective: Office vital signs reviewed. BP 138/85   Pulse 88   Temp (!) 97.2 F (36.2 C)   Ht 5\' 3"  (1.6 m)   Wt 235 lb 3.2 oz (106.7 kg)   SpO2 99%   BMI 41.66 kg/m   Physical Examination:  General: Awake, alert, obese, well appearing, No acute distress HEENT: Normal; sclera white.  Moist mucous membranes Cardio: regular rate and rhythm, S1S2 heard, no murmurs appreciated Pulm: clear to auscultation bilaterally, no wheezes, rhonchi or rales; normal work of breathing on room airs Extremities: warm, well perfused, No edema, cyanosis or clubbing; +2 pulses bilaterally MSK: antalgic gait and station Neuro: Alert and oriented  Assessment/ Plan: 64 y.o. female   1. Sciatica of left side Stable with gabapentin.  I have adjusted her prescription to reflect current dose.  She will follow-up with me in the next 4 to 6 months for physical - gabapentin (NEURONTIN) 300 MG capsule; Take 1 capsule (300 mg total) by mouth at bedtime.  Dispense: 90 capsule; Refill: 3  2. Neuralgia As above - gabapentin (NEURONTIN) 300 MG capsule; Take 1 capsule (300 mg total) by mouth at bedtime.  Dispense: 90 capsule; Refill: 3  3. Impaired mobility and endurance Stable.  Continues to use cane  4. Rheumatoid arthritis involving multiple sites with positive rheumatoid factor ([place]) Followed by rheumatology.  On immunomodulators.  She is had her booster of the COVID-19 shot already.  Unsure the efficacy given close administration from her last Covid shot which was less than a month and a half prior  5. Essential hypertension Controlled.  Continue current regimen  6. Need for immunization against influenza Administered - Flu Vaccine QUAD High  Dose(Fluad)   No orders of the defined types were placed in this encounter.  No orders of the defined types were placed in this encounter.    [name], DO [place] [phone]

## 2020-09-17 ENCOUNTER — Telehealth: Payer: Self-pay

## 2020-09-17 NOTE — Telephone Encounter (Signed)
Patient aware and verbalized understanding. °

## 2020-09-18 ENCOUNTER — Other Ambulatory Visit: Payer: Self-pay | Admitting: Family Medicine

## 2020-09-18 ENCOUNTER — Other Ambulatory Visit: Payer: Self-pay | Admitting: Rheumatology

## 2020-09-18 DIAGNOSIS — M5432 Sciatica, left side: Secondary | ICD-10-CM

## 2020-09-18 DIAGNOSIS — M792 Neuralgia and neuritis, unspecified: Secondary | ICD-10-CM

## 2020-09-29 ENCOUNTER — Other Ambulatory Visit: Payer: Self-pay | Admitting: Rheumatology

## 2020-09-29 ENCOUNTER — Other Ambulatory Visit: Payer: Self-pay | Admitting: Physician Assistant

## 2020-09-29 DIAGNOSIS — M0579 Rheumatoid arthritis with rheumatoid factor of multiple sites without organ or systems involvement: Secondary | ICD-10-CM

## 2020-09-29 NOTE — Telephone Encounter (Signed)
Last Visit: 07/23/2020 Next Visit: 1/19/202 Labs: 07/17/2020 Labs are stable TB Gold: 02/04/2020 Neg   Current Dose per office note on 07/23/2020: Humira 40 mg every 14 days Dx: Rheumatoid arthritis involving multiple sites with positive rheumatoid factor   Okay to refill per Dr. Estanislado Pandy

## 2020-10-01 MED FILL — HUMIRA PEN 40 MG/0.4ML PNKT: 40 | 28 days supply | Qty: 2 | Fill #0

## 2020-10-22 ENCOUNTER — Other Ambulatory Visit: Payer: Self-pay | Admitting: Family Medicine

## 2020-10-22 ENCOUNTER — Other Ambulatory Visit: Payer: Self-pay | Admitting: Rheumatology

## 2020-10-22 DIAGNOSIS — M5432 Sciatica, left side: Secondary | ICD-10-CM

## 2020-10-22 NOTE — Telephone Encounter (Signed)
Last Visit: 07/23/2020 Next Visit: 1/19/202 Labs: 07/17/2020 Labs are stable  Current Dose per office note on 07/23/2020: no mentioned in note  Left message to advise patient she is due for labs.   Okay to refill Arava?

## 2020-10-23 ENCOUNTER — Telehealth: Payer: Self-pay | Admitting: Rheumatology

## 2020-10-23 DIAGNOSIS — Z79899 Other long term (current) drug therapy: Secondary | ICD-10-CM

## 2020-10-23 NOTE — Telephone Encounter (Signed)
Lab Orders faxed

## 2020-10-23 NOTE — Telephone Encounter (Signed)
Patient wants to go to [place] tomorrow for lab draw. Please send orders.

## 2020-10-24 ENCOUNTER — Other Ambulatory Visit: Payer: Medicare Other

## 2020-10-24 ENCOUNTER — Other Ambulatory Visit: Payer: Self-pay | Admitting: Rheumatology

## 2020-10-24 ENCOUNTER — Other Ambulatory Visit: Payer: Self-pay

## 2020-10-24 ENCOUNTER — Other Ambulatory Visit: Payer: Self-pay | Admitting: Physician Assistant

## 2020-10-24 DIAGNOSIS — M0579 Rheumatoid arthritis with rheumatoid factor of multiple sites without organ or systems involvement: Secondary | ICD-10-CM

## 2020-10-24 DIAGNOSIS — Z79899 Other long term (current) drug therapy: Secondary | ICD-10-CM | POA: Diagnosis not present

## 2020-10-24 NOTE — Telephone Encounter (Signed)
Last Visit: 07/23/2020 Next Visit: 1/19/202 Labs: 8/12/2021Labs are stable TB Gold: 02/04/2020 Neg   Current Dose per office note on 07/23/2020: Humira 40 mg every 14 days Dx: Rheumatoid arthritis involving multiple sites with positive rheumatoid factor   Patient advised she is due to update labs.   Okay to refill 30 day supply Humira?

## 2020-10-25 LAB — CBC WITH DIFFERENTIAL/PLATELET
Basophils Absolute: 0 10*3/uL (ref 0.0–0.2)
Basos: 0 %
EOS (ABSOLUTE): 0.2 10*3/uL (ref 0.0–0.4)
Eos: 3 %
Hematocrit: 36.5 % (ref 34.0–46.6)
Hemoglobin: 12.2 g/dL (ref 11.1–15.9)
Immature Grans (Abs): 0 10*3/uL (ref 0.0–0.1)
Immature Granulocytes: 0 %
Lymphocytes Absolute: 2.5 10*3/uL (ref 0.7–3.1)
Lymphs: 36 %
MCH: 28 pg (ref 26.6–33.0)
MCHC: 33.4 g/dL (ref 31.5–35.7)
MCV: 84 fL (ref 79–97)
Monocytes Absolute: 0.4 10*3/uL (ref 0.1–0.9)
Monocytes: 5 %
Neutrophils Absolute: 4 10*3/uL (ref 1.4–7.0)
Neutrophils: 56 %
Platelets: 465 10*3/uL — ABNORMAL HIGH (ref 150–450)
RBC: 4.35 x10E6/uL (ref 3.77–5.28)
RDW: 15 % (ref 11.7–15.4)
WBC: 7.1 10*3/uL (ref 3.4–10.8)

## 2020-10-25 LAB — CMP14+EGFR
ALT: 20 IU/L (ref 0–32)
AST: 18 IU/L (ref 0–40)
Albumin/Globulin Ratio: 0.9 — ABNORMAL LOW (ref 1.2–2.2)
Albumin: 3.6 g/dL — ABNORMAL LOW (ref 3.8–4.8)
Alkaline Phosphatase: 71 IU/L (ref 44–121)
BUN/Creatinine Ratio: 15 (ref 12–28)
BUN: 12 mg/dL (ref 8–27)
Bilirubin Total: 0.4 mg/dL (ref 0.0–1.2)
CO2: 20 mmol/L (ref 20–29)
Calcium: 9.4 mg/dL (ref 8.7–10.3)
Chloride: 97 mmol/L (ref 96–106)
Creatinine, Ser: 0.82 mg/dL (ref 0.57–1.00)
GFR calc Af Amer: 86 mL/min/{1.73_m2} (ref >59)
GFR calc non Af Amer: 75 mL/min/{1.73_m2} (ref >59)
Globulin, Total: 3.8 g/dL (ref 1.5–4.5)
Glucose: 98 mg/dL (ref 65–99)
Potassium: 3.7 mmol/L (ref 3.5–5.2)
Sodium: 138 mmol/L (ref 134–144)
Total Protein: 7.4 g/dL (ref 6.0–8.5)

## 2020-10-27 NOTE — Telephone Encounter (Signed)
CBC and CMP are stable.

## 2020-10-28 MED FILL — HUMIRA PEN 40 MG/0.4ML PNKT: 40 | 28 days supply | Qty: 2 | Fill #0

## 2020-11-19 ENCOUNTER — Other Ambulatory Visit: Payer: Self-pay | Admitting: Physician Assistant

## 2020-11-19 NOTE — Telephone Encounter (Signed)
Last Visit: 07/23/2020 Next Visit: 12/24/2020  Labs: 10/24/2020 CBC and CMP are stable.  Current Dose per office note on 07/23/2020: no mentioned in note  Okay to refill Arava?

## 2020-11-25 ENCOUNTER — Other Ambulatory Visit: Payer: Self-pay | Admitting: Rheumatology

## 2020-11-25 ENCOUNTER — Other Ambulatory Visit: Payer: Self-pay | Admitting: Physician Assistant

## 2020-11-25 DIAGNOSIS — M0579 Rheumatoid arthritis with rheumatoid factor of multiple sites without organ or systems involvement: Secondary | ICD-10-CM

## 2020-11-25 NOTE — Telephone Encounter (Signed)
Last Visit: 07/23/2020 Next Visit: 1/19/202 Labs: 10/24/2020 CBC and CMP are stable. TB Gold: 02/04/2020 Neg  Current Dose per office note on 07/23/2020:Humira 40 mg every 14 days JE:ADGNPHQNET arthritis involving multiple sites with positive rheumatoid factor  Okay to refill per Dr. Estanislado Pandy

## 2020-11-26 MED FILL — HUMIRA PEN 40 MG/0.4ML PNKT: 40 | 28 days supply | Qty: 2 | Fill #0

## 2020-12-05 ENCOUNTER — Other Ambulatory Visit: Payer: Self-pay | Admitting: Family Medicine

## 2020-12-05 DIAGNOSIS — M0579 Rheumatoid arthritis with rheumatoid factor of multiple sites without organ or systems involvement: Secondary | ICD-10-CM

## 2020-12-11 NOTE — Progress Notes (Signed)
Office Visit Note  Patient: Jean Howell             Date of Birth: Mar 21, 1956           MRN: [id number]             PCP: Raliegh Ip, DO Referring: Raliegh Ip, DO Visit Date: 12/24/2020 Occupation: @GUAROCC @  Subjective:  Other (Joint pain/stiffness in bilateral knees and hands )   History of Present Illness: Jean Howell is a 65 y.o. female with seropositive rheumatoid arthritis and osteoarthritis.  She states she has been taking leflunomide 10 mg p.o. daily and Humira 40 mg subcu every other week.  She states she continues to have discomfort in her both hands and her both knees.  She continues to have swelling in her right hand and her both knees.  She states her symptoms have improved but not completely resolved.  Activities of Daily Living:  Patient reports morning stiffness for all day.  Patient Reports nocturnal pain.  Difficulty dressing/grooming: Reports Difficulty climbing stairs: Reports Difficulty getting out of chair: Reports Difficulty using hands for taps, buttons, cutlery, and/or writing: Reports  Review of Systems  Constitutional: Negative for fatigue.  HENT: Positive for mouth dryness. Negative for mouth sores and nose dryness.   Eyes: Negative for pain, itching and dryness.  Respiratory: Negative for shortness of breath and difficulty breathing.   Cardiovascular: Negative for chest pain and palpitations.  Gastrointestinal: Negative for blood in stool, constipation and diarrhea.  Endocrine: Negative for increased urination.  Genitourinary: Negative for difficulty urinating.  Musculoskeletal: Positive for arthralgias, joint pain, joint swelling, myalgias, muscle weakness, morning stiffness, muscle tenderness and myalgias.  Skin: Negative for color change, rash and redness.  Allergic/Immunologic: Negative for susceptible to infections.  Neurological: Positive for dizziness, numbness and memory loss. Negative for headaches and weakness.   Hematological: Negative for bruising/bleeding tendency.  Psychiatric/Behavioral: Negative for confusion.    PMFS History:  Patient Active Problem List   Diagnosis Date Noted  . Full code status 06/16/2020  . Impaired mobility and endurance 06/16/2020  . Sciatica of left side 06/16/2020  . Screen for colon cancer 03/17/2020  . Rheumatoid arthritis involving multiple sites with positive rheumatoid factor (HCC) 03/06/2019  . Primary osteoarthritis of both knees 03/06/2019  . Former smoker 03/06/2019  . ANA positive 02/12/2019  . Family history of thyroid disease 01/08/2019  . Morbid obesity (HCC) 01/08/2019  . Essential hypertension 01/08/2019  . Polyarthralgia 01/08/2019  . Family history of colon cancer 01/08/2019    Past Medical History:  Diagnosis Date  . Arthritis   . Depression   . Hypertension   . Rheumatoid arthritis (HCC)     Family History  Problem Relation Age of Onset  . Cancer Father   . Colon cancer Father   . Thyroid disease Mother   . CVA Mother 19  . Thyroid disease Sister   . Healthy Son   . Congestive Heart Failure Sister   . Hypertension Sister   . Diabetes Sister   . Hypertension Sister   . Diabetes Sister   . Hypertension Sister   . Hypertension Sister   . Cancer Brother        kidney    Past Surgical History:  Procedure Laterality Date  . BREAST SURGERY     nodules removed  . COLONOSCOPY N/A 06/25/2019   Procedure: COLONOSCOPY;  Surgeon: West Bali, MD;  [facility];  Service: Endoscopy;  Laterality: N/A;  1:30  . POLYPECTOMY  06/25/2019   Procedure: POLYPECTOMY;  Surgeon: Danie Binder, MD;  Location: [facility];  Service: Endoscopy;;   Social History   Social History Narrative   Patient is a widow that resides independently in [place].  She has 1 son, who resides in [place].   Immunization History  Administered Date(s) Administered  . Fluad Quad(high Dose 65+) 09/16/2020  . Influenza, Quadrivalent,  Recombinant, Inj, Pf 09/19/2019  . PFIZER(Purple Top)SARS-COV-2 Vaccination 05/23/2020, 06/13/2020, 07/31/2020     Objective: Vital Signs: BP 117/81 (BP Location: Left Wrist, Patient Position: Sitting, Cuff Size: Normal)   Pulse 97   Resp 17   Ht 5\' 3"  (1.6 m)   Wt 220 lb 12.8 oz (100.2 kg)   BMI 39.11 kg/m    Physical Exam Vitals and nursing note reviewed.  Constitutional:      Appearance: She is well-developed and well-nourished.  HENT:     Head: Normocephalic and atraumatic.  Eyes:     Extraocular Movements: EOM normal.     Conjunctiva/sclera: Conjunctivae normal.  Cardiovascular:     Rate and Rhythm: Normal rate and regular rhythm.     Pulses: Intact distal pulses.     Heart sounds: Normal heart sounds.  Pulmonary:     Effort: Pulmonary effort is normal.     Breath sounds: Normal breath sounds.  Abdominal:     General: Bowel sounds are normal.     Palpations: Abdomen is soft.  Musculoskeletal:     Cervical back: Normal range of motion.  Lymphadenopathy:     Cervical: No cervical adenopathy.  Skin:    General: Skin is warm and dry.     Capillary Refill: Capillary refill takes less than 2 seconds.  Neurological:     Mental Status: She is alert and oriented to person, place, and time.  Psychiatric:        Mood and Affect: Mood and affect normal.        Behavior: Behavior normal.      Musculoskeletal Exam: C-spine was in good range of motion.  Shoulder joints and elbow joints with good range of motion.  She has synovitis over bilateral wrist joint and several of her MCPs and PIPs as described below.  Hip joints were difficult to assess in the sitting position.  She has warmth and swelling in her bilateral knee joints.  There was no tenderness over ankles or MTPs.  CDAI Exam: CDAI Score: 15.4  Patient Global: 7 mm; Provider Global: 7 mm Swollen: 7 ; Tender: 7  Joint Exam 12/24/2020      Right  Left  Wrist  Swollen Tender  Swollen Tender  MCP 3  Swollen Tender      MCP 5  Swollen Tender     PIP 3  Swollen Tender     Knee  Swollen Tender  Swollen Tender     Investigation: No additional findings.  Imaging: No results found.  Recent Labs: Lab Results  Component Value Date   WBC 7.1 10/24/2020   HGB 12.2 10/24/2020   PLT 465 (H) 10/24/2020   NA 138 10/24/2020   K 3.7 10/24/2020   CL 97 10/24/2020   CO2 20 10/24/2020   GLUCOSE 98 10/24/2020   BUN 12 10/24/2020   CREATININE 0.82 10/24/2020   BILITOT 0.4 10/24/2020   ALKPHOS 71 10/24/2020   AST 18 10/24/2020   ALT 20 10/24/2020   PROT 7.4 10/24/2020   ALBUMIN 3.6 (L) 10/24/2020  CALCIUM 9.4 10/24/2020   GFRAA 86 10/24/2020   QFTBGOLDPLUS NEGATIVE 02/04/2020    Speciality Comments: PLQ Eye Exam: 12/16/2018 WNL @ Dr Despina Arias Follow up in 1 year  Procedures:  No procedures performed Allergies: Patient has no known allergies.   Assessment / Plan:     Visit Diagnoses: Rheumatoid arthritis involving multiple sites with positive rheumatoid factor (HCC) - Positive RF, +14-3-3 eta, Positive ANA, erosive inflammatory arthritis.  She has severe disease with ongoing synovitis.  She has done better on Arava but she needs more aggressive therapy.  I will increase Arava to 20 mg p.o. daily.  She has 10 mg tablets.  She will increase it to 2 tablets daily until she finishes those.  I have advised her to come back for labs in 2 weeks and then 2 months.  If labs are stable we will monitor labs every 3 months.  When she runs out of the prescription we will call in a prescription for Arava 20 mg tablet which she can take 1 tablet daily.  If she has an inadequate response to the combination therapy we may have to switch her biologic therapy.  High risk medication use - Arava 10 mg p.o. daily + Humira 40 mg every 14 days,( methotrexate 6 tablets every 7 days was discontinued due to elevated creatinine).  Her labs were normal on 10/24/2020.  TB gold was negative on 02/04/2020.  She is fully vaccinated against  COVID-19 and also received a booster.  Use of mask, social distancing and hand hygiene was discussed.  Primary osteoarthritis and rheumatoid arthritis of both knees - she has severe osteoarthritis and moderate chondromalacia patella in her knee joints.  She has difficulty walking and uses a walker.  She still had warmth and swelling in her knee joints.  ANA positive - she has no clinical features of lupus.  Other fatigue-she continues to experience fatigue due to chronic disease.  Essential hypertension-blood pressure is normal today.  Former smoker  History of depression  Family history of thyroid disease  Family history of colon cancer  Orders: No orders of the defined types were placed in this encounter.  No orders of the defined types were placed in this encounter.    Follow-Up Instructions: Return in about 2 months (around 02/21/2021) for Rheumatoid arthritis.   Pollyann Savoy, MD  Note - This record has been created using Animal nutritionist.  Chart creation errors have been sought, but may not always  have been located. Such creation errors do not reflect on  the standard of medical care.

## 2020-12-24 ENCOUNTER — Ambulatory Visit (INDEPENDENT_AMBULATORY_CARE_PROVIDER_SITE_OTHER): Payer: Medicare Other | Admitting: Rheumatology

## 2020-12-24 ENCOUNTER — Encounter: Payer: Self-pay | Admitting: Rheumatology

## 2020-12-24 ENCOUNTER — Telehealth: Payer: Self-pay

## 2020-12-24 ENCOUNTER — Other Ambulatory Visit: Payer: Self-pay

## 2020-12-24 VITALS — BP 117/81 | HR 97 | Resp 17 | Ht 63.0 in | Wt 220.8 lb

## 2020-12-24 DIAGNOSIS — R5383 Other fatigue: Secondary | ICD-10-CM

## 2020-12-24 DIAGNOSIS — Z800 Family history of malignant neoplasm of digestive organs: Secondary | ICD-10-CM

## 2020-12-24 DIAGNOSIS — Z8349 Family history of other endocrine, nutritional and metabolic diseases: Secondary | ICD-10-CM | POA: Diagnosis not present

## 2020-12-24 DIAGNOSIS — I10 Essential (primary) hypertension: Secondary | ICD-10-CM

## 2020-12-24 DIAGNOSIS — Z79899 Other long term (current) drug therapy: Secondary | ICD-10-CM

## 2020-12-24 DIAGNOSIS — M170 Bilateral primary osteoarthritis of knee: Secondary | ICD-10-CM

## 2020-12-24 DIAGNOSIS — R768 Other specified abnormal immunological findings in serum: Secondary | ICD-10-CM

## 2020-12-24 DIAGNOSIS — M0579 Rheumatoid arthritis with rheumatoid factor of multiple sites without organ or systems involvement: Secondary | ICD-10-CM

## 2020-12-24 DIAGNOSIS — Z87891 Personal history of nicotine dependence: Secondary | ICD-10-CM

## 2020-12-24 DIAGNOSIS — R7689 Other specified abnormal immunological findings in serum: Secondary | ICD-10-CM

## 2020-12-24 DIAGNOSIS — Z8659 Personal history of other mental and behavioral disorders: Secondary | ICD-10-CM

## 2020-12-24 MED FILL — HUMIRA PEN 40 MG/0.4ML PNKT: 40 | 28 days supply | Qty: 2 | Fill #1

## 2020-12-24 NOTE — Telephone Encounter (Signed)
Dr. Estanislado Pandy instructed patient to call the office prior to the next refill of leflunomide. The patient has been advised to increase leflunomide dose to 10mg , 2 tablets by mouth daily. The next prescription should be written for leflunomide 20mg , 1 tablet by mouth daily if labs are stable. Thanks!

## 2020-12-24 NOTE — Telephone Encounter (Signed)
Noted  

## 2020-12-24 NOTE — Patient Instructions (Signed)
Increase leflunomide 10 mg tablet, 2 tablets daily.  Please come in for blood work in 2 weeks to monitor for any drug toxicity.  When you finish your leflunomide prescription please call us for a refill your new prescription is going to be leflunomide 20 mg tablet, 1 tablet daily    Standing Labs We placed an order today for your standing lab work.   Please have your standing labs drawn in 2 weeks, 2 months and then every 3 months  If possible, please have your labs drawn 2 weeks prior to your appointment so that the provider can discuss your results at your appointment.  We have open lab daily Monday through Thursday from 8:30-12:30 PM and 1:30-4:30 PM and Friday from 8:30-12:30 PM and 1:30-4:00 PM at the office of Dr. Bo Merino, [facility].   Please be advised, patients with office appointments requiring lab work will take precedents over walk-in lab work.  If possible, please come for your lab work on Monday and Friday afternoons, as you may experience shorter wait times. The office is located at [address], [place], [place], [place] [address] No appointment is necessary.   Labs are drawn by [facility]. Please bring your co-pay at the time of your lab draw.  You may receive a bill from [facility] for your lab work.  If you wish to have your labs drawn at another location, please call the office 24 hours in advance to send orders.  If you have any questions regarding directions or hours of operation,  please call [phone].   As a reminder, please drink plenty of water prior to coming for your lab work. Thanks!

## 2021-01-01 ENCOUNTER — Telehealth: Payer: Self-pay

## 2021-01-01 NOTE — Telephone Encounter (Signed)
Marisol from [facility] left a voicemail yesterday, 12/31/20 at 7:07 pm concerning patient's Humira PEN 40 mg.  Yves Dill states that patient notified their office that she had one malfunctioning pen.  We will replace this pen at no cost to the patient once we receive the prescription.  You can call [phone] to give [place] the verbal or you can fax the prescription to [phone]

## 2021-01-01 NOTE — Telephone Encounter (Signed)
Contacted [facility] and spoke with Joe the pharmacist and gave a verbal authorization to dispense the replacement Humira pen with the sig: INJECT 40 MG INTO THE SKIN EVERY 14 (FOURTEEN) DAYS. They will contact the patient and set up shipment.

## 2021-01-07 ENCOUNTER — Other Ambulatory Visit: Payer: Self-pay | Admitting: Rheumatology

## 2021-01-07 ENCOUNTER — Other Ambulatory Visit: Payer: Self-pay | Admitting: *Deleted

## 2021-01-07 DIAGNOSIS — Z79899 Other long term (current) drug therapy: Secondary | ICD-10-CM

## 2021-01-07 DIAGNOSIS — M0579 Rheumatoid arthritis with rheumatoid factor of multiple sites without organ or systems involvement: Secondary | ICD-10-CM

## 2021-01-07 DIAGNOSIS — Z111 Encounter for screening for respiratory tuberculosis: Secondary | ICD-10-CM

## 2021-01-07 DIAGNOSIS — Z9225 Personal history of immunosupression therapy: Secondary | ICD-10-CM

## 2021-01-07 NOTE — Telephone Encounter (Signed)
Patient came in for labs today, and needs refill on Leflunomide. Please send to [facility] in [place]. Patient took last tab today.

## 2021-01-07 NOTE — Telephone Encounter (Signed)
Last Visit: 12/24/2020 Next Visit: 02/18/2021 Labs: 10/24/2020 CBC and CMP are stable. (Updated today)  Current Dose per office note 12/24/2020:Arava  She has 10 mg tablets.  She will increase it to 2 tablets daily until she finishes those.  I have advised her to come back for labs in 2 weeks and then 2 months.  DX: Rheumatoid arthritis involving multiple sites with positive rheumatoid factor   Okay to refill Arava?

## 2021-01-08 MED ORDER — LEFLUNOMIDE 20 MG PO TABS: 20.0000 mg | ORAL_TABLET | Freq: Every day | ORAL | 0 refills | Status: DC

## 2021-01-09 LAB — CBC WITH DIFFERENTIAL/PLATELET
Absolute Monocytes: 600 cells/uL (ref 200–950)
Basophils Absolute: 61 cells/uL (ref 0–200)
Basophils Relative: 0.7 %
Eosinophils Absolute: 252 cells/uL (ref 15–500)
Eosinophils Relative: 2.9 %
HCT: 35.3 % (ref 35.0–45.0)
Hemoglobin: 11.6 g/dL — ABNORMAL LOW (ref 11.7–15.5)
Lymphs Abs: 2419 cells/uL (ref 850–3900)
MCH: 27.3 pg (ref 27.0–33.0)
MCHC: 32.9 g/dL (ref 32.0–36.0)
MCV: 83.1 fL (ref 80.0–100.0)
MPV: 9.5 fL (ref 7.5–12.5)
Monocytes Relative: 6.9 %
Neutro Abs: 5368 cells/uL (ref 1500–7800)
Neutrophils Relative %: 61.7 %
Platelets: 489 10*3/uL — ABNORMAL HIGH (ref 140–400)
RBC: 4.25 10*6/uL (ref 3.80–5.10)
RDW: 14.5 % (ref 11.0–15.0)
Total Lymphocyte: 27.8 %
WBC: 8.7 10*3/uL (ref 3.8–10.8)

## 2021-01-09 LAB — COMPLETE METABOLIC PANEL WITH GFR
AG Ratio: 0.8 (calc) — ABNORMAL LOW (ref 1.0–2.5)
ALT: 22 U/L (ref 6–29)
AST: 20 U/L (ref 10–35)
Albumin: 3.3 g/dL — ABNORMAL LOW (ref 3.6–5.1)
Alkaline phosphatase (APISO): 66 U/L (ref 37–153)
BUN: 14 mg/dL (ref 7–25)
CO2: 26 mmol/L (ref 20–32)
Calcium: 9.9 mg/dL (ref 8.6–10.4)
Chloride: 101 mmol/L (ref 98–110)
Creat: 0.81 mg/dL (ref 0.50–0.99)
GFR, Est African American: 88 mL/min/{1.73_m2} (ref >=60)
GFR, Est Non African American: 76 mL/min/{1.73_m2} (ref >=60)
Globulin: 3.9 g/dL (calc) — ABNORMAL HIGH (ref 1.9–3.7)
Glucose, Bld: 92 mg/dL (ref 65–99)
Potassium: 4.1 mmol/L (ref 3.5–5.3)
Sodium: 139 mmol/L (ref 135–146)
Total Bilirubin: 0.3 mg/dL (ref 0.2–1.2)
Total Protein: 7.2 g/dL (ref 6.1–8.1)

## 2021-01-09 LAB — QUANTIFERON-TB GOLD PLUS
Mitogen-NIL: 7.54 IU/mL
NIL: 0.04 IU/mL
QuantiFERON-TB Gold Plus: NEGATIVE
TB1-NIL: 0 IU/mL
TB2-NIL: 0.01 IU/mL

## 2021-01-10 NOTE — Progress Notes (Signed)
TB gold is negative.

## 2021-01-20 MED FILL — HUMIRA PEN 40 MG/0.4ML PNKT: 40 | 28 days supply | Qty: 2 | Fill #2

## 2021-02-04 NOTE — Progress Notes (Signed)
Office Visit Note  Patient: Jean Howell             Date of Birth: June 17, 1956           MRN: [id number]             PCP: Raliegh Ip, DO Referring: Raliegh Ip, DO Visit Date: 02/18/2021 Occupation: @GUAROCC @  Subjective:  Medication monitoring   History of Present Illness: Jean Howell is a 65 y.o. female with history of seropositive rheumatoid arthritis and osteoarthritis.  She is taking arava 20 mg 1 tablet by mouth daily and Humira 40 mg sq injections every 14 days.  She increased the dose of Arava from 10 mg to 20 mg by mouth daily after her last office visit on 12/24/2020.  She has started to notice gradual improvement in her symptoms since then.  She states that since Saturday she has had all "good days.".  She currently rates her pain a 2 out of 10.  She states that this is the best she has felt since initially being diagnosed.  She continues to walk with a walker due to chronic pain in both knee joints.  She also continues to wear wrist brace bilaterally to help with the pain and stiffness she experiences.  The rheumatoid nodule on the extensor surface of her left elbow has started to reduce in size. She denies any recent infections.     Activities of Daily Living:  Patient reports morning stiffness for all day.   Patient Denies nocturnal pain.  Difficulty dressing/grooming: Reports Difficulty climbing stairs: Reports Difficulty getting out of chair: Reports Difficulty using hands for taps, buttons, cutlery, and/or writing: Reports  Review of Systems  Constitutional: Negative for fatigue.  HENT: Positive for mouth dryness. Negative for mouth sores and nose dryness.   Eyes: Negative for pain, itching, visual disturbance and dryness.  Respiratory: Negative for cough, hemoptysis, shortness of breath and difficulty breathing.   Cardiovascular: Negative for chest pain, palpitations, hypertension and swelling in legs/feet.  Gastrointestinal: Negative for blood  in stool, constipation and diarrhea.  Endocrine: Negative for increased urination.  Genitourinary: Positive for difficulty urinating. Negative for painful urination.  Musculoskeletal: Positive for arthralgias, joint pain, joint swelling, myalgias, muscle weakness, morning stiffness, muscle tenderness and myalgias.  Skin: Negative for color change, pallor, rash, hair loss, nodules/bumps, redness, skin tightness, ulcers and sensitivity to sunlight.  Allergic/Immunologic: Negative for susceptible to infections.  Neurological: Negative for headaches, memory loss and weakness.  Hematological: Negative for bruising/bleeding tendency and swollen glands.  Psychiatric/Behavioral: Negative for depressed mood, confusion and sleep disturbance. The patient is not nervous/anxious.     PMFS History:  Patient Active Problem List   Diagnosis Date Noted  . Full code status 06/16/2020  . Impaired mobility and endurance 06/16/2020  . Sciatica of left side 06/16/2020  . Screen for colon cancer 03/17/2020  . Rheumatoid arthritis involving multiple sites with positive rheumatoid factor (HCC) 03/06/2019  . Primary osteoarthritis of both knees 03/06/2019  . Former smoker 03/06/2019  . ANA positive 02/12/2019  . Family history of thyroid disease 01/08/2019  . Morbid obesity (HCC) 01/08/2019  . Essential hypertension 01/08/2019  . Polyarthralgia 01/08/2019  . Family history of colon cancer 01/08/2019    Past Medical History:  Diagnosis Date  . Arthritis   . Depression   . Hypertension   . Rheumatoid arthritis (HCC)     Family History  Problem Relation Age of Onset  . Cancer Father   . Colon  cancer Father   . Thyroid disease Mother   . CVA Mother 54  . Thyroid disease Sister   . Healthy Son   . Congestive Heart Failure Sister   . Hypertension Sister   . Diabetes Sister   . Hypertension Sister   . Diabetes Sister   . Hypertension Sister   . Hypertension Sister   . Cancer Brother        kidney     Past Surgical History:  Procedure Laterality Date  . BREAST SURGERY     nodules removed  . COLONOSCOPY N/A 06/25/2019   Procedure: COLONOSCOPY;  Surgeon: West Bali, MD;  [facility];  Service: Endoscopy;  Laterality: N/A;  1:30  . POLYPECTOMY  06/25/2019   Procedure: POLYPECTOMY;  Surgeon: West Bali, MD;  [facility];  Service: Endoscopy;;   Social History   Social History Narrative   Patient is a widow that resides independently in [place].  She has 1 son, who resides in [place].   Immunization History  Administered Date(s) Administered  . Fluad Quad(high Dose 65+) 09/16/2020  . Influenza, Quadrivalent, Recombinant, Inj, Pf 09/19/2019  . PFIZER(Purple Top)SARS-COV-2 Vaccination 05/23/2020, 06/13/2020, 07/31/2020     Objective: Vital Signs: BP 128/87 (BP Location: Left Wrist, Patient Position: Sitting, Cuff Size: Normal)   Pulse 89   Resp 17   Ht 5\' 3"  (1.6 m)   Wt 222 lb (100.7 kg)   BMI 39.33 kg/m    Physical Exam Vitals and nursing note reviewed.  Constitutional:      Appearance: She is well-developed.  HENT:     Head: Normocephalic and atraumatic.  Eyes:     Conjunctiva/sclera: Conjunctivae normal.  Pulmonary:     Effort: Pulmonary effort is normal.  Abdominal:     Palpations: Abdomen is soft.  Musculoskeletal:     Cervical back: Normal range of motion.  Skin:    General: Skin is warm and dry.     Capillary Refill: Capillary refill takes less than 2 seconds.  Neurological:     Mental Status: She is alert and oriented to person, place, and time.  Psychiatric:        Behavior: Behavior normal.      Musculoskeletal Exam: C-spine has good range of motion with no discomfort.  Some discomfort with lumbar range of motion.  Shoulder joints, elbow joints, wrist joints have good range of motion with no tenderness or synovitis.  She has synovitis over the right third MCP and PIP joint but no tenderness was noted.  She is  able to make a complete fist bilaterally.  Hip joints have good range of motion with no discomfort.  She has painful range of motion of both knee joints.  Ongoing warmth in both knees but no effusion was noted.  Ankle joints have good range of motion with some tenderness in the right ankle.  CDAI Exam: CDAI Score: 2.4  Patient Global: 2 mm; Provider Global: 2 mm Swollen: 2 ; Tender: 1  Joint Exam 02/18/2021      Right  Left  MCP 3  Swollen      PIP 3  Swollen      Ankle   Tender        Investigation: No additional findings.  Imaging: No results found.  Recent Labs: Lab Results  Component Value Date   WBC 8.7 01/07/2021   HGB 11.6 (L) 01/07/2021   PLT 489 (H) 01/07/2021   NA 139 01/07/2021  K 4.1 01/07/2021   CL 101 01/07/2021   CO2 26 01/07/2021   GLUCOSE 92 01/07/2021   BUN 14 01/07/2021   CREATININE 0.81 01/07/2021   BILITOT 0.3 01/07/2021   ALKPHOS 71 10/24/2020   AST 20 01/07/2021   ALT 22 01/07/2021   PROT 7.2 01/07/2021   ALBUMIN 3.6 (L) 10/24/2020   CALCIUM 9.9 01/07/2021   GFRAA 88 01/07/2021   QFTBGOLDPLUS NEGATIVE 01/07/2021    Speciality Comments: PLQ Eye Exam: 12/16/2018 WNL @ Dr Despina Arias Follow up in 1 year  Procedures:  No procedures performed Allergies: Patient has no known allergies.    Assessment / Plan:     Visit Diagnoses: Rheumatoid arthritis involving multiple sites with positive rheumatoid factor (HCC) - Positive RF, +14-3-3 eta, Positive ANA, erosive inflammatory arthritis: She has ongoing synovitis in the right third MCP and PIP joint but no tenderness was noted.  She continues to have chronic pain in both knee joints leading to difficulty with ambulation.  She is currently on Humira 40 mg subcutaneous injections every 14 days and Arava 20 mg 1 tablet by mouth daily.  The dose of Arava was increased from 10 mg to 20 mg daily after her office visit on 12/24/2020 with Dr. Corliss Skains.  Over the past 1 week she has noticed significant clinical  improvement on combination therapy.  She has rated her pain a 2 out of 10 and feels the best she has since being diagnosed.  She does not want to make any medication changes at this time.  She will continue on Humira and Arava as prescribed.  She was advised to notify us if she develops signs or symptoms of recurrent flares.  She will follow-up in the office in 3 months to reassess.  High risk medication use - Arava 20 mg 1 tablet by mouth daily and Humira 40 mg sq injections every 14 days.  ( methotrexate 6 tablets every 7 days was discontinued due to elevated creatinine).CBC and CMP drawn on 01/07/21.  Her next lab work will be due in May and every 3 months.  Standing orders for CBC and CMP remain in place.  TB gold negative on 01/07/21 and will continue to be monitored yearly. She has not had any recent infections.  She was advised to hold arava and humira if she develops signs or symptoms of an infection and to resume once the infection has completely cleared. She has received 3 covid-19 vaccine doses.  Discussed that ACR has recommended a fourth dose 5 to 6 months after the third dose.  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  She continues to have chronic pain in both knee joints.  She has difficulty ambulating due to severity of pain and stiffness.  She continues to use a walker to assist with ambulation.  On examination today she has good range of motion with discomfort in both knees.  Warmth of both knees but no effusion was noted.  ANA positive - She has no clinical features of lupus.  Other fatigue: She is not experiencing any increased fatigue at this time.  Essential hypertension: Her blood pressure was 128/87 today in the office.  Other medical conditions are listed as follows:  History of depression  Family history of thyroid disease  Family history of colon cancer  Former  smoker  Orders: No orders of the defined types were placed in this encounter.  No orders  of the defined types were placed in this encounter.    Follow-Up Instructions: Return in about 3 months (around 05/21/2021) for Rheumatoid arthritis, Osteoarthritis.   Gearldine Bienenstock, PA-C  Note - This record has been created using Dragon software.  Chart creation errors have been sought, but may not always  have been located. Such creation errors do not reflect on  the standard of medical care.

## 2021-02-16 ENCOUNTER — Other Ambulatory Visit: Payer: Self-pay | Admitting: Rheumatology

## 2021-02-16 ENCOUNTER — Other Ambulatory Visit: Payer: Self-pay | Admitting: Physician Assistant

## 2021-02-16 DIAGNOSIS — M0579 Rheumatoid arthritis with rheumatoid factor of multiple sites without organ or systems involvement: Secondary | ICD-10-CM

## 2021-02-16 NOTE — Telephone Encounter (Signed)
Next Visit: 02/18/2021  Last Visit: 12/24/2020  Last Fill: 11/25/2020  DX: Rheumatoid arthritis involving multiple sites with positive rheumatoid factor  Current Dose per office note 12/25/2019: Humira 40 mg every 14 days  Labs: 01/07/2021- CMP stable. Hemoglobin borderline low-11.6. Platelet count is elevated but stable. We will continue to monitor lab work closely.   Okay to refill Humira?

## 2021-02-18 ENCOUNTER — Ambulatory Visit (INDEPENDENT_AMBULATORY_CARE_PROVIDER_SITE_OTHER): Payer: Medicare Other | Admitting: Physician Assistant

## 2021-02-18 ENCOUNTER — Encounter: Payer: Self-pay | Admitting: Physician Assistant

## 2021-02-18 ENCOUNTER — Other Ambulatory Visit: Payer: Self-pay

## 2021-02-18 VITALS — BP 128/87 | HR 89 | Resp 17 | Ht 63.0 in | Wt 222.0 lb

## 2021-02-18 DIAGNOSIS — M0579 Rheumatoid arthritis with rheumatoid factor of multiple sites without organ or systems involvement: Secondary | ICD-10-CM | POA: Diagnosis not present

## 2021-02-18 DIAGNOSIS — Z800 Family history of malignant neoplasm of digestive organs: Secondary | ICD-10-CM | POA: Diagnosis not present

## 2021-02-18 DIAGNOSIS — R5383 Other fatigue: Secondary | ICD-10-CM

## 2021-02-18 DIAGNOSIS — Z79899 Other long term (current) drug therapy: Secondary | ICD-10-CM | POA: Diagnosis not present

## 2021-02-18 DIAGNOSIS — Z8349 Family history of other endocrine, nutritional and metabolic diseases: Secondary | ICD-10-CM | POA: Diagnosis not present

## 2021-02-18 DIAGNOSIS — I10 Essential (primary) hypertension: Secondary | ICD-10-CM | POA: Diagnosis not present

## 2021-02-18 DIAGNOSIS — R768 Other specified abnormal immunological findings in serum: Secondary | ICD-10-CM

## 2021-02-18 DIAGNOSIS — Z87891 Personal history of nicotine dependence: Secondary | ICD-10-CM | POA: Diagnosis not present

## 2021-02-18 DIAGNOSIS — M170 Bilateral primary osteoarthritis of knee: Secondary | ICD-10-CM

## 2021-02-18 DIAGNOSIS — Z8659 Personal history of other mental and behavioral disorders: Secondary | ICD-10-CM

## 2021-02-18 NOTE — Patient Instructions (Signed)
Standing Labs We placed an order today for your standing lab work.   Please have your standing labs drawn in April and every 3 months   If possible, please have your labs drawn 2 weeks prior to your appointment so that the provider can discuss your results at your appointment.  We have open lab daily Monday through Thursday from 1:30-4:30 PM and Friday from 1:30-4:00 PM at the office of Dr. Bo Merino, [facility].   Please be advised, all patients with office appointments requiring lab work will take precedents over walk-in lab work.  If possible, please come for your lab work on Monday and Friday afternoons, as you may experience shorter wait times. The office is located at [address] [place], [place], [place] [address] No appointment is necessary.   Labs are drawn by [facility]. Please bring your co-pay at the time of your lab draw.  You may receive a bill from [facility] for your lab work.  If you wish to have your labs drawn at another location, please call the office 24 hours in advance to send orders.  If you have any questions regarding directions or hours of operation,  please call [phone].   As a reminder, please drink plenty of water prior to coming for your lab work. Thanks!

## 2021-02-25 ENCOUNTER — Other Ambulatory Visit: Payer: Self-pay | Admitting: Family Medicine

## 2021-02-25 DIAGNOSIS — M0579 Rheumatoid arthritis with rheumatoid factor of multiple sites without organ or systems involvement: Secondary | ICD-10-CM

## 2021-03-04 ENCOUNTER — Other Ambulatory Visit: Payer: Self-pay | Admitting: Physician Assistant

## 2021-03-04 ENCOUNTER — Other Ambulatory Visit: Payer: Self-pay | Admitting: Family Medicine

## 2021-03-04 DIAGNOSIS — I10 Essential (primary) hypertension: Secondary | ICD-10-CM

## 2021-03-05 ENCOUNTER — Other Ambulatory Visit (HOSPITAL_COMMUNITY): Payer: Self-pay

## 2021-03-12 ENCOUNTER — Telehealth: Payer: Self-pay | Admitting: Rheumatology

## 2021-03-12 ENCOUNTER — Other Ambulatory Visit: Payer: Self-pay | Admitting: *Deleted

## 2021-03-12 DIAGNOSIS — Z79899 Other long term (current) drug therapy: Secondary | ICD-10-CM

## 2021-03-12 DIAGNOSIS — M0579 Rheumatoid arthritis with rheumatoid factor of multiple sites without organ or systems involvement: Secondary | ICD-10-CM

## 2021-03-12 NOTE — Telephone Encounter (Signed)
Released for [facility], faxed to [facility].

## 2021-03-12 NOTE — Telephone Encounter (Signed)
Patient going tomorrow for labs, and request order be sent to [facility]. (patient's PCP)

## 2021-03-13 ENCOUNTER — Encounter: Payer: Self-pay | Admitting: Family Medicine

## 2021-03-13 ENCOUNTER — Ambulatory Visit (INDEPENDENT_AMBULATORY_CARE_PROVIDER_SITE_OTHER): Payer: Medicare Other | Admitting: Family Medicine

## 2021-03-13 ENCOUNTER — Other Ambulatory Visit (HOSPITAL_COMMUNITY): Payer: Self-pay

## 2021-03-13 ENCOUNTER — Other Ambulatory Visit: Payer: Self-pay

## 2021-03-13 VITALS — BP 109/80 | HR 88 | Temp 98.0°F | Ht 63.0 in | Wt 221.4 lb

## 2021-03-13 DIAGNOSIS — R799 Abnormal finding of blood chemistry, unspecified: Secondary | ICD-10-CM | POA: Diagnosis not present

## 2021-03-13 DIAGNOSIS — I10 Essential (primary) hypertension: Secondary | ICD-10-CM

## 2021-03-13 DIAGNOSIS — M792 Neuralgia and neuritis, unspecified: Secondary | ICD-10-CM | POA: Diagnosis not present

## 2021-03-13 DIAGNOSIS — M0579 Rheumatoid arthritis with rheumatoid factor of multiple sites without organ or systems involvement: Secondary | ICD-10-CM | POA: Diagnosis not present

## 2021-03-13 DIAGNOSIS — Z0001 Encounter for general adult medical examination with abnormal findings: Secondary | ICD-10-CM | POA: Diagnosis not present

## 2021-03-13 DIAGNOSIS — M5432 Sciatica, left side: Secondary | ICD-10-CM

## 2021-03-13 DIAGNOSIS — Z0000 Encounter for general adult medical examination without abnormal findings: Secondary | ICD-10-CM

## 2021-03-13 LAB — BAYER DCA HB A1C WAIVED: HB A1C (BAYER DCA - WAIVED): 4.9 % (ref <7.0)

## 2021-03-13 MED ORDER — GABAPENTIN 400 MG PO CAPS: 400.0000 mg | ORAL_CAPSULE | Freq: Every day | ORAL | 3 refills | Status: DC

## 2021-03-13 MED FILL — Adalimumab Auto-injector Kit 40 MG/0.4ML: SUBCUTANEOUS | 28 days supply | Qty: 2 | Fill #0 | Status: AC

## 2021-03-13 NOTE — Progress Notes (Signed)
Jean Howell is a 65 y.o. female presents to office today for annual physical exam examination.    Concerns today include: 1.  Sciatica Patient reports that she initially only had sciatica down the left lower extremity but now has a down the right as well.  She continues to take gabapentin 300 mg at bedtime and this was initially helpful.  Does not report any excessive daytime sleepiness with this or falls.  No edema.  2.  Hypertension Patient is compliant with losartan-hydrochlorothiazide 100-12.5 mg daily.  She has not been checking home blood pressures because her cuff is no longer accurate.  No chest pain, shortness of breath, dizziness.  3.  Rheumatoid arthritis Patient had recent increased dose of Arava to 20 mg.  She is also treated with Humira and is closely followed by rheumatology.  She needs labs done today given recent dose increase.  Overall she feels that her rheumatoid pain is much better controlled than previous but she is having a flareup right now.  Occupation: retired, Substance use: none Diet: fair, does not cook, son does the cooking, Exercise: no structured Last eye exam: >1 year ago Last dental exam: has false upper and lower Last colonoscopy: UTD Last mammogram: needs Last pap smear: n/a >69 yo. No vaginal symptoms Refills needed today: none Immunizations needed:  Immunization History  Administered Date(s) Administered  . Fluad Quad(high Dose 65+) 09/16/2020  . Influenza, Quadrivalent, Recombinant, Inj, Pf 09/19/2019  . PFIZER(Purple Top)SARS-COV-2 Vaccination 05/23/2020, 06/13/2020, 07/31/2020     Past Medical History:  Diagnosis Date  . Arthritis   . Depression   . Hypertension   . Rheumatoid arthritis ([place])    Social History   Socioeconomic History  . Marital status: Widowed    Spouse name: Not on file  . Number of children: 1  . Years of education: Not on file  . Highest education level: GED or equivalent  Occupational History  . Not on  file  Tobacco Use  . Smoking status: Former Smoker    Packs/day: 1.00    Years: 15.00    Pack years: 15.00    Types: Cigarettes    Quit date: 2000    Years since quitting: 22.2  . Smokeless tobacco: Never Used  Vaping Use  . Vaping Use: Never used  Substance and Sexual Activity  . Alcohol use: Not Currently  . Drug use: Never  . Sexual activity: Not Currently    Comment: widowed  Other Topics Concern  . Not on file  Social History Narrative   Patient is a widow that resides independently in [place].  She has 1 son, who resides in [place].   Social Determinants of Health   Financial Resource Strain: Not on file  Food Insecurity: Not on file  Transportation Needs: Not on file  Physical Activity: Not on file  Stress: Not on file  Social Connections: Not on file  Intimate Partner Violence: Not on file   Past Surgical History:  Procedure Laterality Date  . BREAST SURGERY     nodules removed  . COLONOSCOPY N/A 06/25/2019   Procedure: COLONOSCOPY;  Surgeon: Danie Binder, MD;  [facility];  Service: Endoscopy;  Laterality: N/A;  1:30  . POLYPECTOMY  06/25/2019   Procedure: POLYPECTOMY;  Surgeon: Danie Binder, MD;  [facility];  Service: Endoscopy;;   Family History  Problem Relation Age of Onset  . Cancer Father   . Colon cancer Father   . Thyroid  disease Mother   . CVA Mother 25  . Thyroid disease Sister   . Healthy Son   . Congestive Heart Failure Sister   . Hypertension Sister   . Diabetes Sister   . Hypertension Sister   . Diabetes Sister   . Hypertension Sister   . Hypertension Sister   . Cancer Brother        kidney     Current Outpatient Medications:  .  Acetaminophen (TYLENOL PO), Take 2 tablets by mouth every 8 (eight) hours as needed. , Disp: , Rfl:  .  Adalimumab 40 MG/0.4ML PNKT, INJECT 40 MG INTO THE SKIN EVERY 14 (FOURTEEN) DAYS., Disp: 6 each, Rfl: 0 .  amitriptyline (ELAVIL) 25 MG tablet, TAKE ONE TABLET DAILY  AT BEDTIME, Disp: 90 tablet, Rfl: 0 .  CALCIUM PO, Take by mouth daily., Disp: , Rfl:  .  Docusate Calcium (STOOL SOFTENER PO), Take by mouth 2 (two) times daily., Disp: , Rfl:  .  gabapentin (NEURONTIN) 300 MG capsule, Take 1 capsule (300 mg total) by mouth at bedtime., Disp: 90 capsule, Rfl: 3 .  leflunomide (ARAVA) 20 MG tablet, Take 1 tablet (20 mg total) by mouth daily., Disp: 90 tablet, Rfl: 0 .  loratadine (CLARITIN) 10 MG tablet, Take 10 mg by mouth as needed., Disp: , Rfl:  .  losartan-hydrochlorothiazide (HYZAAR) 100-12.5 MG tablet, TAKE ONE (1) TABLET EACH DAY, Disp: 90 tablet, Rfl: 0 .  Multiple Vitamin (MULTIVITAMIN PO), Take 1 tablet by mouth daily., Disp: , Rfl:  .  VITAMIN D PO, Take 1 tablet by mouth daily., Disp: , Rfl:   No Known Allergies   ROS: Review of Systems A comprehensive review of systems was negative except for: Eyes: positive for contacts/glasses Musculoskeletal: positive for arthralgias and leg pain    Physical exam BP 109/80   Pulse 88   Temp 98 F (36.7 C)   Ht '5\' 3"'  (1.6 m)   Wt 221 lb 6.4 oz (100.4 kg)   SpO2 98%   BMI 39.22 kg/m  General appearance: alert, cooperative, appears stated age, no distress and morbidly obese Head: Normocephalic, without obvious abnormality, atraumatic Eyes: negative findings: lids and lashes normal, conjunctivae and sclerae normal, corneas clear, pupils equal, round, reactive to light and accomodation and visual fields full to confrontation Ears: normal TM's and external ear canals both ears Nose: Nares normal. Septum midline. Mucosa normal. No drainage or sinus tenderness. Throat: False teeth noted in the upper and lower.  Oropharynx without masses or erythema.  No sublingual masses appreciated Neck: no adenopathy, supple, symmetrical, trachea midline and thyroid not enlarged, symmetric, no tenderness/mass/nodules Back: Mild thoracic kyphosis noted.  Ambulates with use of assistive device.  Gait is very slow. Lungs:  clear to auscultation bilaterally Heart: regular rate and rhythm, S1, S2 normal, no murmur, click, rub or gallop Abdomen: soft, non-tender; bowel sounds normal; no masses,  no organomegaly Extremities: extremities normal, atraumatic, no cyanosis or edema Pulses: 2+ and symmetric Skin: Skin color, texture, turgor normal. No rashes or lesions Lymph nodes: Cervical, supraclavicular, and axillary nodes normal. Neurologic: Grossly normal Psych: Mood stable.  Patient is pleasant and interactive.   Assessment/ Plan: [name] here for annual physical exam.   Annual physical exam  Morbid obesity ([place]) - Plan: Lipid Panel, TSH, Bayer DCA Hb A1c Waived  Rheumatoid arthritis involving multiple sites with positive rheumatoid factor ([place]) - Plan: CMP14+EGFR, CBC with Differential/Platelet  Essential hypertension - Plan: Lipid Panel  Sciatica of left side -  Plan: gabapentin (NEURONTIN) 400 MG capsule  Neuralgia - Plan: gabapentin (NEURONTIN) 400 MG capsule  No evidence of diabetes in this patient with A1c of 4.9.  We will check CMP and CBC and CCed to Dr. Estanislado Pandy as labs had already been collected before her labs were released.  Blood pressures under good control.  Could consider reducing her blood pressure medication given the lower end of normal.  She will monitor blood pressures at home.  Unsure if fasting status is confounding blood pressure.  Increase gabapentin to 400 mg daily.  She will let me know if this is helpful.  Counseled on healthy lifestyle choices, including diet (rich in fruits, vegetables and lean meats and low in salt and simple carbohydrates) and exercise (at least 30 minutes of moderate physical activity daily).  Patient to follow up in 1 year for annual exam or sooner if needed.  Mea Ozga M. Lajuana Ripple, DO

## 2021-03-13 NOTE — Patient Instructions (Addendum)
You had labs performed today.  You will be contacted with the results of the labs once they are available, usually in the next 3 business days for routine lab work.  If you have an active my chart account, they will be released to your MyChart.  If you prefer to have these labs released to you via telephone, please let us know.  If you had a pap smear or biopsy performed, expect to be contacted in about 7-10 days.  Schedule mammogram  Gabapentin increased to 412m at bedtime.   Preventive Care 637Years and Older, Female Preventive care refers to lifestyle choices and visits with your health care provider that can promote health and wellness. This includes:  A yearly physical exam. This is also called an annual wellness visit.  Regular dental and eye exams.  Immunizations.  Screening for certain conditions.  Healthy lifestyle choices, such as: ? Eating a healthy diet. ? Getting regular exercise. ? Not using drugs or products that contain nicotine and tobacco. ? Limiting alcohol use. What can I expect for my preventive care visit? Physical exam Your health care provider will check your:  Height and weight. These may be used to calculate your BMI (body mass index). BMI is a measurement that tells if you are at a healthy weight.  Heart rate and blood pressure.  Body temperature.  Skin for abnormal spots. Counseling Your health care provider may ask you questions about your:  Past medical problems.  Family's medical history.  Alcohol, tobacco, and drug use.  Emotional well-being.  Home life and relationship well-being.  Sexual activity.  Diet, exercise, and sleep habits.  History of falls.  Memory and ability to understand (cognition).  Work and work eStatistician  Pregnancy and menstrual history.  Access to firearms. What immunizations do I need? Vaccines are usually given at various ages, according to a schedule. Your health care provider will recommend  vaccines for you based on your age, medical history, and lifestyle or other factors, such as travel or where you work.   What tests do I need? Blood tests  Lipid and cholesterol levels. These may be checked every 5 years, or more often depending on your overall health.  Hepatitis C test.  Hepatitis B test. Screening  Lung cancer screening. You may have this screening every year starting at age 67if you have a 30-pack-year history of smoking and currently smoke or have quit within the past 15 years.  Colorectal cancer screening. ? All adults should have this screening starting at age 814and continuing until age 67 ? Your health care provider may recommend screening at age 2658if you are at increased risk. ? You will have tests every 1-10 years, depending on your results and the type of screening test.  Diabetes screening. ? This is done by checking your blood sugar (glucose) after you have not eaten for a while (fasting). ? You may have this done every 1-3 years.  Mammogram. ? This may be done every 1-2 years. ? Talk with your health care provider about how often you should have regular mammograms.  Abdominal aortic aneurysm (AAA) screening. You may need this if you are a current or former smoker.  BRCA-related cancer screening. This may be done if you have a family history of breast, ovarian, tubal, or peritoneal cancers. Other tests  STD (sexually transmitted disease) testing, if you are at risk.  Bone density scan. This is done to screen for osteoporosis. You may have this  done starting at age 77. Talk with your health care provider about your test results, treatment options, and if necessary, the need for more tests. Follow these instructions at home: Eating and drinking  Eat a diet that includes fresh fruits and vegetables, whole grains, lean protein, and low-fat dairy products. Limit your intake of foods with high amounts of sugar, saturated fats, and salt.  Take vitamin  and mineral supplements as recommended by your health care provider.  Do not drink alcohol if your health care provider tells you not to drink.  If you drink alcohol: ? Limit how much you have to 0-1 drink a day. ? Be aware of how much alcohol is in your drink. In the [place] one drink equals one 12 oz bottle of beer (355 mL), one 5 oz glass of wine (148 mL), or one 1 oz glass of hard liquor (44 mL).   Lifestyle  Take daily care of your teeth and gums. Brush your teeth every morning and night with fluoride toothpaste. Floss one time each day.  Stay active. Exercise for at least 30 minutes 5 or more days each week.  Do not use any products that contain nicotine or tobacco, such as cigarettes, e-cigarettes, and chewing tobacco. If you need help quitting, ask your health care provider.  Do not use drugs.  If you are sexually active, practice safe sex. Use a condom or other form of protection in order to prevent STIs (sexually transmitted infections).  Talk with your health care provider about taking a low-dose aspirin or statin.  Find healthy ways to cope with stress, such as: ? Meditation, yoga, or listening to music. ? Journaling. ? Talking to a trusted person. ? Spending time with friends and family. Safety  Always wear your seat belt while driving or riding in a vehicle.  Do not drive: ? If you have been drinking alcohol. Do not ride with someone who has been drinking. ? When you are tired or distracted. ? While texting.  Wear a helmet and other protective equipment during sports activities.  If you have firearms in your house, make sure you follow all gun safety procedures. What's next?  Visit your health care provider once a year for an annual wellness visit.  Ask your health care provider how often you should have your eyes and teeth checked.  Stay up to date on all vaccines. This information is not intended to replace advice given to you by your health care provider.  Make sure you discuss any questions you have with your health care provider. Document Revised: 11/12/2020 Document Reviewed: 11/16/2018 Elsevier Patient Education  2021 [place] had labs performed today.  You will be contacted with the results of the labs once they are available, usually in the next 3 business days for routine lab work.  If you have an active my chart account, they will be released to your MyChart.  If you prefer to have these labs released to you via telephone, please let us know.   Preventive Care 29 Years and Older, Female Preventive care refers to lifestyle choices and visits with your health care provider that can promote health and wellness. This includes:  A yearly physical exam. This is also called an annual wellness visit.  Regular dental and eye exams.  Immunizations.  Screening for certain conditions.  Healthy lifestyle choices, such as: ? Eating a healthy diet. ? Getting regular exercise. ? Not using drugs or products that contain nicotine and tobacco. ?  Limiting alcohol use. What can I expect for my preventive care visit? Physical exam Your health care provider will check your:  Height and weight. These may be used to calculate your BMI (body mass index). BMI is a measurement that tells if you are at a healthy weight.  Heart rate and blood pressure.  Body temperature.  Skin for abnormal spots. Counseling Your health care provider may ask you questions about your:  Past medical problems.  Family's medical history.  Alcohol, tobacco, and drug use.  Emotional well-being.  Home life and relationship well-being.  Sexual activity.  Diet, exercise, and sleep habits.  History of falls.  Memory and ability to understand (cognition).  Work and work Statistician.  Pregnancy and menstrual history.  Access to firearms. What immunizations do I need? Vaccines are usually given at various ages, according to a schedule. Your health  care provider will recommend vaccines for you based on your age, medical history, and lifestyle or other factors, such as travel or where you work.   What tests do I need? Blood tests  Lipid and cholesterol levels. These may be checked every 5 years, or more often depending on your overall health.  Hepatitis C test.  Hepatitis B test. Screening  Lung cancer screening. You may have this screening every year starting at age 54 if you have a 30-pack-year history of smoking and currently smoke or have quit within the past 15 years.  Colorectal cancer screening. ? All adults should have this screening starting at age 56 and continuing until age 78. ? Your health care provider may recommend screening at age 35 if you are at increased risk. ? You will have tests every 1-10 years, depending on your results and the type of screening test.  Diabetes screening. ? This is done by checking your blood sugar (glucose) after you have not eaten for a while (fasting). ? You may have this done every 1-3 years.  Mammogram. ? This may be done every 1-2 years. ? Talk with your health care provider about how often you should have regular mammograms.  Abdominal aortic aneurysm (AAA) screening. You may need this if you are a current or former smoker.  BRCA-related cancer screening. This may be done if you have a family history of breast, ovarian, tubal, or peritoneal cancers. Other tests  STD (sexually transmitted disease) testing, if you are at risk.  Bone density scan. This is done to screen for osteoporosis. You may have this done starting at age 32. Talk with your health care provider about your test results, treatment options, and if necessary, the need for more tests. Follow these instructions at home: Eating and drinking  Eat a diet that includes fresh fruits and vegetables, whole grains, lean protein, and low-fat dairy products. Limit your intake of foods with high amounts of sugar, saturated  fats, and salt.  Take vitamin and mineral supplements as recommended by your health care provider.  Do not drink alcohol if your health care provider tells you not to drink.  If you drink alcohol: ? Limit how much you have to 0-1 drink a day. ? Be aware of how much alcohol is in your drink. In the [place] one drink equals one 12 oz bottle of beer (355 mL), one 5 oz glass of wine (148 mL), or one 1 oz glass of hard liquor (44 mL).   Lifestyle  Take daily care of your teeth and gums. Brush your teeth every morning and night with fluoride toothpaste.  Floss one time each day.  Stay active. Exercise for at least 30 minutes 5 or more days each week.  Do not use any products that contain nicotine or tobacco, such as cigarettes, e-cigarettes, and chewing tobacco. If you need help quitting, ask your health care provider.  Do not use drugs.  If you are sexually active, practice safe sex. Use a condom or other form of protection in order to prevent STIs (sexually transmitted infections).  Talk with your health care provider about taking a low-dose aspirin or statin.  Find healthy ways to cope with stress, such as: ? Meditation, yoga, or listening to music. ? Journaling. ? Talking to a trusted person. ? Spending time with friends and family. Safety  Always wear your seat belt while driving or riding in a vehicle.  Do not drive: ? If you have been drinking alcohol. Do not ride with someone who has been drinking. ? When you are tired or distracted. ? While texting.  Wear a helmet and other protective equipment during sports activities.  If you have firearms in your house, make sure you follow all gun safety procedures. What's next?  Visit your health care provider once a year for an annual wellness visit.  Ask your health care provider how often you should have your eyes and teeth checked.  Stay up to date on all vaccines. This information is not intended to replace advice given to  you by your health care provider. Make sure you discuss any questions you have with your health care provider. Document Revised: 11/12/2020 Document Reviewed: 11/16/2018 Elsevier Patient Education  2021 [employer].

## 2021-03-14 LAB — CBC WITH DIFFERENTIAL/PLATELET
Basophils Absolute: 0.1 10*3/uL (ref 0.0–0.2)
Basos: 1 %
EOS (ABSOLUTE): 0.3 10*3/uL (ref 0.0–0.4)
Eos: 4 %
Hematocrit: 35 % (ref 34.0–46.6)
Hemoglobin: 11.6 g/dL (ref 11.1–15.9)
Immature Grans (Abs): 0 10*3/uL (ref 0.0–0.1)
Immature Granulocytes: 0 %
Lymphocytes Absolute: 2.3 10*3/uL (ref 0.7–3.1)
Lymphs: 31 %
MCH: 27.7 pg (ref 26.6–33.0)
MCHC: 33.1 g/dL (ref 31.5–35.7)
MCV: 84 fL (ref 79–97)
Monocytes Absolute: 0.5 10*3/uL (ref 0.1–0.9)
Monocytes: 7 %
Neutrophils Absolute: 4.2 10*3/uL (ref 1.4–7.0)
Neutrophils: 57 %
Platelets: 399 10*3/uL (ref 150–450)
RBC: 4.19 x10E6/uL (ref 3.77–5.28)
RDW: 15.9 % — ABNORMAL HIGH (ref 11.7–15.4)
WBC: 7.3 10*3/uL (ref 3.4–10.8)

## 2021-03-14 LAB — CMP14+EGFR
ALT: 15 IU/L (ref 0–32)
AST: 15 IU/L (ref 0–40)
Albumin/Globulin Ratio: 0.9 — ABNORMAL LOW (ref 1.2–2.2)
Albumin: 3.4 g/dL — ABNORMAL LOW (ref 3.8–4.8)
Alkaline Phosphatase: 90 IU/L (ref 44–121)
BUN/Creatinine Ratio: 10 — ABNORMAL LOW (ref 12–28)
BUN: 11 mg/dL (ref 8–27)
Bilirubin Total: <0.2 mg/dL (ref 0.0–1.2)
CO2: 22 mmol/L (ref 20–29)
Calcium: 9.3 mg/dL (ref 8.7–10.3)
Chloride: 98 mmol/L (ref 96–106)
Creatinine, Ser: 1.09 mg/dL — ABNORMAL HIGH (ref 0.57–1.00)
Globulin, Total: 3.8 g/dL (ref 1.5–4.5)
Glucose: 91 mg/dL (ref 65–99)
Potassium: 4.1 mmol/L (ref 3.5–5.2)
Sodium: 137 mmol/L (ref 134–144)
Total Protein: 7.2 g/dL (ref 6.0–8.5)
eGFR: 56 mL/min/{1.73_m2} — ABNORMAL LOW (ref >59)

## 2021-03-14 LAB — LIPID PANEL
Chol/HDL Ratio: 3.5 ratio (ref 0.0–4.4)
Cholesterol, Total: 159 mg/dL (ref 100–199)
HDL: 46 mg/dL (ref >39)
LDL Chol Calc (NIH): 86 mg/dL (ref 0–99)
Triglycerides: 158 mg/dL — ABNORMAL HIGH (ref 0–149)
VLDL Cholesterol Cal: 27 mg/dL (ref 5–40)

## 2021-03-14 LAB — TSH: TSH: 2.22 u[IU]/mL (ref 0.450–4.500)

## 2021-03-16 ENCOUNTER — Other Ambulatory Visit: Payer: Self-pay

## 2021-03-16 DIAGNOSIS — R899 Unspecified abnormal finding in specimens from other organs, systems and tissues: Secondary | ICD-10-CM

## 2021-03-31 ENCOUNTER — Other Ambulatory Visit: Payer: Medicare Other

## 2021-03-31 ENCOUNTER — Ambulatory Visit: Payer: Medicare Other

## 2021-03-31 ENCOUNTER — Other Ambulatory Visit: Payer: Self-pay

## 2021-03-31 DIAGNOSIS — R899 Unspecified abnormal finding in specimens from other organs, systems and tissues: Secondary | ICD-10-CM | POA: Diagnosis not present

## 2021-03-31 DIAGNOSIS — Z0130 Encounter for examination of blood pressure without abnormal findings: Secondary | ICD-10-CM

## 2021-03-31 LAB — BMP8+EGFR
BUN/Creatinine Ratio: 13 (ref 12–28)
BUN: 13 mg/dL (ref 8–27)
CO2: 25 mmol/L (ref 20–29)
Calcium: 9.3 mg/dL (ref 8.7–10.3)
Chloride: 104 mmol/L (ref 96–106)
Creatinine, Ser: 1.03 mg/dL — ABNORMAL HIGH (ref 0.57–1.00)
Glucose: 90 mg/dL (ref 65–99)
Potassium: 4.8 mmol/L (ref 3.5–5.2)
Sodium: 143 mmol/L (ref 134–144)
eGFR: 60 mL/min/{1.73_m2} (ref >59)

## 2021-03-31 NOTE — Progress Notes (Signed)
Patient here today to have her blood pressure checked.  She has been getting normal readings at home but wants to make sure that her machine is correct.   Blood pressure was checked and found to be 144/95, with a pulse rate of 89. Blood pressure was checked with patient's machine at 124/75, pulse 84.  Patient sat for a few minutes and blood pressure was rechecked with our machine at 141/89, pulse 85.

## 2021-04-08 ENCOUNTER — Other Ambulatory Visit: Payer: Self-pay | Admitting: Physician Assistant

## 2021-04-08 NOTE — Telephone Encounter (Signed)
Next Visit: 05/21/2021  Last Visit: 02/18/2021  Last Fill: 01/08/2021  DX:  Rheumatoid arthritis involving multiple sites with positive rheumatoid factor   Current Dose per office note 02/18/2021, Arava 20 mg 1 tablet by mouth daily  Labs: 03/31/2021, Creatine 1.03, 03/13/2021, RDW 15.9, Triglycerides 158  Okay to refill Arava?

## 2021-04-09 ENCOUNTER — Other Ambulatory Visit (HOSPITAL_COMMUNITY): Payer: Self-pay

## 2021-04-09 MED FILL — Adalimumab Auto-injector Kit 40 MG/0.4ML: SUBCUTANEOUS | 28 days supply | Qty: 2 | Fill #1 | Status: AC

## 2021-04-15 ENCOUNTER — Other Ambulatory Visit (HOSPITAL_COMMUNITY): Payer: Self-pay

## 2021-04-22 ENCOUNTER — Other Ambulatory Visit: Payer: Self-pay

## 2021-04-22 ENCOUNTER — Telehealth: Payer: Self-pay

## 2021-04-22 DIAGNOSIS — Z79899 Other long term (current) drug therapy: Secondary | ICD-10-CM

## 2021-04-22 DIAGNOSIS — M0579 Rheumatoid arthritis with rheumatoid factor of multiple sites without organ or systems involvement: Secondary | ICD-10-CM

## 2021-04-22 NOTE — Telephone Encounter (Signed)
Advised patient that orders have been released and patient states she will go to [place] and have the labs drawn. I advised patient to advise them the orders are in the system.

## 2021-04-22 NOTE — Telephone Encounter (Signed)
Patient called requesting her labwork orders be sent to [facility] in [place].

## 2021-04-23 ENCOUNTER — Other Ambulatory Visit: Payer: Self-pay

## 2021-04-23 ENCOUNTER — Other Ambulatory Visit: Payer: Medicare Other

## 2021-04-23 DIAGNOSIS — M0579 Rheumatoid arthritis with rheumatoid factor of multiple sites without organ or systems involvement: Secondary | ICD-10-CM | POA: Diagnosis not present

## 2021-04-23 DIAGNOSIS — Z79899 Other long term (current) drug therapy: Secondary | ICD-10-CM | POA: Diagnosis not present

## 2021-04-23 LAB — CBC WITH DIFFERENTIAL/PLATELET
Basophils Absolute: 0 10*3/uL (ref 0.0–0.2)
Basos: 1 %
EOS (ABSOLUTE): 0.3 10*3/uL (ref 0.0–0.4)
Eos: 4 %
Hematocrit: 37.8 % (ref 34.0–46.6)
Hemoglobin: 12.2 g/dL (ref 11.1–15.9)
Immature Grans (Abs): 0 10*3/uL (ref 0.0–0.1)
Immature Granulocytes: 0 %
Lymphocytes Absolute: 1.7 10*3/uL (ref 0.7–3.1)
Lymphs: 23 %
MCH: 27.2 pg (ref 26.6–33.0)
MCHC: 32.3 g/dL (ref 31.5–35.7)
MCV: 84 fL (ref 79–97)
Monocytes Absolute: 0.4 10*3/uL (ref 0.1–0.9)
Monocytes: 5 %
Neutrophils Absolute: 5.1 10*3/uL (ref 1.4–7.0)
Neutrophils: 67 %
Platelets: 390 10*3/uL (ref 150–450)
RBC: 4.48 x10E6/uL (ref 3.77–5.28)
RDW: 14.8 % (ref 11.7–15.4)
WBC: 7.6 10*3/uL (ref 3.4–10.8)

## 2021-04-23 LAB — CMP14+EGFR
ALT: 14 IU/L (ref 0–32)
AST: 21 IU/L (ref 0–40)
Albumin/Globulin Ratio: 0.8 — ABNORMAL LOW (ref 1.2–2.2)
Albumin: 3.3 g/dL — ABNORMAL LOW (ref 3.8–4.8)
Alkaline Phosphatase: 95 IU/L (ref 44–121)
BUN/Creatinine Ratio: 11 — ABNORMAL LOW (ref 12–28)
BUN: 12 mg/dL (ref 8–27)
Bilirubin Total: 0.3 mg/dL (ref 0.0–1.2)
CO2: 21 mmol/L (ref 20–29)
Calcium: 8.9 mg/dL (ref 8.7–10.3)
Chloride: 98 mmol/L (ref 96–106)
Creatinine, Ser: 1.07 mg/dL — ABNORMAL HIGH (ref 0.57–1.00)
Globulin, Total: 4 g/dL (ref 1.5–4.5)
Glucose: 96 mg/dL (ref 65–99)
Potassium: 4.2 mmol/L (ref 3.5–5.2)
Sodium: 138 mmol/L (ref 134–144)
Total Protein: 7.3 g/dL (ref 6.0–8.5)
eGFR: 57 mL/min/{1.73_m2} — ABNORMAL LOW (ref >59)

## 2021-04-24 NOTE — Progress Notes (Signed)
CBC is normal, creatinine is elevated and stable.

## 2021-05-01 ENCOUNTER — Other Ambulatory Visit: Payer: Self-pay | Admitting: Family Medicine

## 2021-05-01 DIAGNOSIS — Z1231 Encounter for screening mammogram for malignant neoplasm of breast: Secondary | ICD-10-CM

## 2021-05-07 NOTE — Progress Notes (Signed)
Office Visit Note  Patient: Jean Howell             Date of Birth: 1956/02/20           MRN: [id number]             PCP: [name], DO Referring: [name], DO Visit Date: 05/21/2021 Occupation: @GUAROCC @  Subjective:  Pain of the Right Knee   History of Present Illness: Jean Howell is a 65 y.o. female with a history of rheumatoid arthritis.  She has been taking Humira and leflunomide on a regular basis.  She states she was taking Tylenol previously which she stopped.  She has been experiencing increased pain and discomfort in her right knee joint.  None of the joints are swollen today.  Activities of Daily Living:  Patient reports morning stiffness for 24 hours.   Patient Reports nocturnal pain.  Difficulty dressing/grooming: Denies Difficulty climbing stairs: Reports Difficulty getting out of chair: Reports Difficulty using hands for taps, buttons, cutlery, and/or writing: Reports  Review of Systems  Constitutional:  Negative for fatigue, night sweats, weight gain and weight loss.  HENT:  Positive for mouth dryness. Negative for mouth sores, trouble swallowing, trouble swallowing and nose dryness.   Eyes:  Negative for pain, redness, visual disturbance and dryness.  Respiratory:  Negative for cough, shortness of breath and difficulty breathing.   Cardiovascular:  Negative for chest pain, palpitations, hypertension, irregular heartbeat and swelling in legs/feet.  Gastrointestinal:  Negative for blood in stool, constipation and diarrhea.  Endocrine: Negative for increased urination.  Genitourinary:  Negative for vaginal dryness.  Musculoskeletal:  Positive for joint pain, joint pain, myalgias, morning stiffness and myalgias. Negative for joint swelling, muscle weakness and muscle tenderness.  Skin:  Negative for color change, rash, hair loss, skin tightness, ulcers and sensitivity to sunlight.  Allergic/Immunologic: Negative for susceptible to infections.   Neurological:  Negative for dizziness, memory loss, night sweats and weakness.  Hematological:  Negative for swollen glands.  Psychiatric/Behavioral:  Negative for depressed mood and sleep disturbance. The patient is not nervous/anxious.    PMFS History:  Patient Active Problem List   Diagnosis Date Noted   Full code status 06/16/2020   Impaired mobility and endurance 06/16/2020   Sciatica of left side 06/16/2020   Rheumatoid arthritis involving multiple sites with positive rheumatoid factor ([place]) 03/06/2019   Primary osteoarthritis of both knees 03/06/2019   Former smoker 03/06/2019   ANA positive 02/12/2019   Family history of thyroid disease 01/08/2019   Morbid obesity ([place]) 01/08/2019   Essential hypertension 01/08/2019   Family history of colon cancer 01/08/2019    Past Medical History:  Diagnosis Date   Arthritis    Depression    Hypertension    Rheumatoid arthritis ([place])     Family History  Problem Relation Age of Onset   Cancer Father    Colon cancer Father    Thyroid disease Mother    CVA Mother 42   Thyroid disease Sister    Healthy Son    Congestive Heart Failure Sister    Hypertension Sister    Diabetes Sister    Hypertension Sister    Diabetes Sister    Hypertension Sister    Hypertension Sister    Cancer Brother        kidney    Past Surgical History:  Procedure Laterality Date   BREAST SURGERY     nodules removed   COLONOSCOPY N/A 06/25/2019   Procedure:  COLONOSCOPY;  Surgeon: Danie Binder, MD;  [facility];  Service: Endoscopy;  Laterality: N/A;  1:30   POLYPECTOMY  06/25/2019   Procedure: POLYPECTOMY;  Surgeon: Danie Binder, MD;  Location: [facility];  Service: Endoscopy;;   Social History   Social History Narrative   Patient is a widow that resides independently in [place].  She has 1 son, who resides in [place].   Immunization History  Administered Date(s) Administered   Fluad Quad(high Dose 65+) 09/16/2020    Influenza, Quadrivalent, Recombinant, Inj, Pf 09/19/2019   PFIZER(Purple Top)SARS-COV-2 Vaccination 05/23/2020, 06/13/2020, 07/31/2020     Objective: Vital Signs: BP 102/73 (BP Location: Left Arm, Patient Position: Sitting, Cuff Size: Large)   Pulse 94   Resp 14   Ht 5\' 3"  (1.6 m)   Wt 215 lb (97.5 kg)   BMI 38.09 kg/m    Physical Exam Vitals and nursing note reviewed.  Constitutional:      Appearance: She is well-developed.  HENT:     Head: Normocephalic and atraumatic.  Eyes:     Conjunctiva/sclera: Conjunctivae normal.  Cardiovascular:     Rate and Rhythm: Normal rate and regular rhythm.     Heart sounds: Normal heart sounds.  Pulmonary:     Effort: Pulmonary effort is normal.     Breath sounds: Normal breath sounds.  Abdominal:     General: Bowel sounds are normal.     Palpations: Abdomen is soft.  Musculoskeletal:     Cervical back: Normal range of motion.  Lymphadenopathy:     Cervical: No cervical adenopathy.  Skin:    General: Skin is warm and dry.     Capillary Refill: Capillary refill takes less than 2 seconds.  Neurological:     Mental Status: She is alert and oriented to person, place, and time.  Psychiatric:        Behavior: Behavior normal.     Musculoskeletal Exam: C-spine was in good range of motion.  Shoulder joints, elbow joints, wrist joints, MCPs PIPs and DIPs with good range of motion with no synovitis.  Hip joints with good range of motion.  She has some discomfort range of motion of her right knee joint without any warmth swelling or effusion.  There was no tenderness over ankles or MTPs.  She mobilizes with the help of a walker.  CDAI Exam: CDAI Score: 1.9  Patient Global: 6 mm; Provider Global: 3 mm Swollen: 0 ; Tender: 1  Joint Exam 05/21/2021      Right  Left  Knee   Tender        Investigation: No additional findings.  Imaging: No results found.  Recent Labs: Lab Results  Component Value Date   WBC 7.6 04/23/2021   HGB  12.2 04/23/2021   PLT 390 04/23/2021   NA 138 04/23/2021   K 4.2 04/23/2021   CL 98 04/23/2021   CO2 21 04/23/2021   GLUCOSE 96 04/23/2021   BUN 12 04/23/2021   CREATININE 1.07 (H) 04/23/2021   BILITOT 0.3 04/23/2021   ALKPHOS 95 04/23/2021   AST 21 04/23/2021   ALT 14 04/23/2021   PROT 7.3 04/23/2021   ALBUMIN 3.3 (L) 04/23/2021   CALCIUM 8.9 04/23/2021   GFRAA 88 01/07/2021   QFTBGOLDPLUS NEGATIVE 01/07/2021    Speciality Comments: PLQ Eye Exam: 12/16/2018 WNL @ Dr Anthony Sar Follow up in 1 year  Procedures:  No procedures performed Allergies: Patient has no known allergies.   Assessment /  Plan:     Visit Diagnoses: Rheumatoid arthritis involving multiple sites with positive rheumatoid factor (HCC) - Positive RF, +14-3-3 eta, Positive ANA, erosive inflammatory arthritis: Patient had no synovitis on my examination.  She complains of some discomfort in her right knee joint.  High risk medication use - Arava 20 mg 1 tablet by mouth daily and Humira 40 mg sq injections every 14 days.  ( methotrexate 6 tablets every 7 days was discontinued due to elevated creat).  Her last labs on Apr 23, 2021 were normal except creatinine of 1.07.  TB gold was negative on November 06, 2021.  She will get labs in August and then every 3 months to monitor for drug toxicity.  She has been advised to discontinue Humira and Arava in case she develops an infection and then restart the medication once the infection resolves.  She has been fully vaccinated against COVID-19 and received a booster.  Information regarding other immunizations were placed in the AVS.  She has been also advised to get an annual skin examination to screen for nonmelanoma skin cancer.  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  She mobilizes with the help of a walker.  She has been having increased discomfort in her right knee joint.   She states Tylenol helps her knee joint discomfort.  She was advised to use Tylenol only on as needed basis.  ANA positive - She has no clinical features of lupus.  Other fatigue-she continues to have some fatigue.  Essential hypertension-blood pressure was normal today.  History of depression  Former smoker  BMI 38.09-weight loss was discussed at length.  Family history of thyroid disease  Family history of colon cancer  Orders: No orders of the defined types were placed in this encounter.  No orders of the defined types were placed in this encounter.   Follow-Up Instructions: Return in about 3 months (around 08/21/2021) for Rheumatoid arthritis.   Bo Merino, MD  Note - This record has been created using Editor, commissioning.  Chart creation errors have been sought, but may not always  have been located. Such creation errors do not reflect on  the standard of medical care.

## 2021-05-12 ENCOUNTER — Other Ambulatory Visit: Payer: Self-pay | Admitting: Physician Assistant

## 2021-05-12 ENCOUNTER — Other Ambulatory Visit (HOSPITAL_COMMUNITY): Payer: Self-pay

## 2021-05-12 DIAGNOSIS — M0579 Rheumatoid arthritis with rheumatoid factor of multiple sites without organ or systems involvement: Secondary | ICD-10-CM

## 2021-05-12 MED ORDER — HUMIRA (2 PEN) 40 MG/0.4ML ~~LOC~~ AJKT
40.0000 mg | AUTO-INJECTOR | SUBCUTANEOUS | 0 refills | Status: DC
Start: 2021-05-12 — End: 2021-07-31
  Filled 2021-05-12: qty 2, 28d supply, fill #0
  Filled 2021-06-04: qty 2, 28d supply, fill #1
  Filled 2021-07-06: qty 2, 28d supply, fill #2

## 2021-05-12 NOTE — Telephone Encounter (Signed)
Next Visit: 05/21/2021  Last Visit: 02/18/2021  Last Fill: 02/16/2021  DX:  Rheumatoid arthritis involving multiple sites with positive rheumatoid factor   Current Dose per office note 02/18/2021, Humira 40 mg sq injections every 14 days  Labs: 03/31/2021, Creatine 1.03, 03/13/2021, RDW 15.9, Triglycerides 158  TB Gold: 01/07/2021 Neg   Okay to refill Humira?

## 2021-05-14 ENCOUNTER — Other Ambulatory Visit (HOSPITAL_COMMUNITY): Payer: Self-pay

## 2021-05-15 ENCOUNTER — Other Ambulatory Visit (HOSPITAL_COMMUNITY): Payer: Self-pay

## 2021-05-21 ENCOUNTER — Ambulatory Visit (INDEPENDENT_AMBULATORY_CARE_PROVIDER_SITE_OTHER): Payer: Medicare Other | Admitting: Rheumatology

## 2021-05-21 ENCOUNTER — Encounter: Payer: Self-pay | Admitting: Rheumatology

## 2021-05-21 ENCOUNTER — Other Ambulatory Visit: Payer: Self-pay

## 2021-05-21 VITALS — BP 102/73 | HR 94 | Resp 14 | Ht 63.0 in | Wt 215.0 lb

## 2021-05-21 DIAGNOSIS — R768 Other specified abnormal immunological findings in serum: Secondary | ICD-10-CM

## 2021-05-21 DIAGNOSIS — R5383 Other fatigue: Secondary | ICD-10-CM

## 2021-05-21 DIAGNOSIS — Z6838 Body mass index (BMI) 38.0-38.9, adult: Secondary | ICD-10-CM

## 2021-05-21 DIAGNOSIS — Z79899 Other long term (current) drug therapy: Secondary | ICD-10-CM | POA: Diagnosis not present

## 2021-05-21 DIAGNOSIS — Z87891 Personal history of nicotine dependence: Secondary | ICD-10-CM

## 2021-05-21 DIAGNOSIS — Z800 Family history of malignant neoplasm of digestive organs: Secondary | ICD-10-CM | POA: Diagnosis not present

## 2021-05-21 DIAGNOSIS — M0579 Rheumatoid arthritis with rheumatoid factor of multiple sites without organ or systems involvement: Secondary | ICD-10-CM | POA: Diagnosis not present

## 2021-05-21 DIAGNOSIS — M170 Bilateral primary osteoarthritis of knee: Secondary | ICD-10-CM | POA: Diagnosis not present

## 2021-05-21 DIAGNOSIS — Z8349 Family history of other endocrine, nutritional and metabolic diseases: Secondary | ICD-10-CM | POA: Diagnosis not present

## 2021-05-21 DIAGNOSIS — I10 Essential (primary) hypertension: Secondary | ICD-10-CM | POA: Diagnosis not present

## 2021-05-21 DIAGNOSIS — Z8659 Personal history of other mental and behavioral disorders: Secondary | ICD-10-CM

## 2021-05-21 NOTE — Patient Instructions (Signed)
Standing Labs We placed an order today for your standing lab work.   Please have your standing labs drawn in August and every 3 months  If possible, please have your labs drawn 2 weeks prior to your appointment so that the provider can discuss your results at your appointment.  Please note that you may see your imaging and lab results in [facility] before we have reviewed them. We may be awaiting multiple results to interpret others before contacting you. Please allow our office up to 72 hours to thoroughly review all of the results before contacting the office for clarification of your results.  We have open lab daily: Monday through Thursday from 1:30-4:30 PM and Friday from 1:30-4:00 PM at the office of Dr. Bo Merino, [facility].   Please be advised, all patients with office appointments requiring lab work will take precedent over walk-in lab work.  If possible, please come for your lab work on Monday and Friday afternoons, as you may experience shorter wait times. The office is located at [address], [place], [place], [place] [address] No appointment is necessary.   Labs are drawn by [facility]. Please bring your co-pay at the time of your lab draw.  You may receive a bill from [facility] for your lab work.  If you wish to have your labs drawn at another location, please call the office 24 hours in advance to send orders.  If you have any questions regarding directions or hours of operation,  please call [phone].   As a reminder, please drink plenty of water prior to coming for your lab work. Thanks!   Vaccines You are taking a medication(s) that can suppress your immune system.  The following immunizations are recommended: Flu annually Covid-19  Td/Tdap (tetanus, diphtheria, pertussis) every 10 years Pneumonia (Prevnar 15 then Pneumovax 23 at least 1 year apart.  Alternatively, can take Prevnar 20 without needing additional dose) Shingrix (after age 19): 2  doses from 4 weeks to 6 months apart  Please check with your PCP to make sure you are up to date.   If you test POSITIVE for COVID19 and have MILD to MODERATE symptoms: First, call your PCP if you would like to receive COVID19 treatment AND Hold your medications during the infection and for at least 1 week after your symptoms have resolved: Injectable medication (Benlysta, Cimzia, Cosentyx, Enbrel, Humira, Orencia, Remicade, Simponi, Stelara, Taltz, Tremfya) Methotrexate Leflunomide (Arava) Mycophenolate (Cellcept) Morrie Sheldon, Olumiant, or Rinvoq If you take Actemra or Kevzara, you DO NOT need to hold these for COVID19 infection.  If you test POSITIVE for COVID19 and have NO symptoms: First, call your PCP if you would like to receive COVID19 treatment AND Hold your medications for at least 10 days after the day that you tested positive Injectable medication (Benlysta, Cimzia, Cosentyx, Enbrel, Humira, Orencia, Remicade, Simponi, Stelara, Taltz, Tremfya) Methotrexate Leflunomide (Arava) Mycophenolate (Cellcept) Morrie Sheldon, Olumiant, or Rinvoq If you take Actemra or Kevzara, you DO NOT need to hold these for COVID19 infection.  If you have signs or symptoms of an infection or start antibiotics: First, call your PCP for workup of your infection. Hold your medication through the infection, until you complete your antibiotics, and until symptoms resolve if you take the following: Injectable medication (Actemra, Benlysta, Cimzia, Cosentyx, Enbrel, Humira, Kevzara, Orencia, Remicade, Simponi, Stelara, Taltz, Tremfya) Methotrexate Leflunomide (Arava) Mycophenolate (Cellcept) Morrie Sheldon, Olumiant, or Rinvoq   Please get annual skin examination to screen for nonmelanoma skin cancer while you are on  Humira.  Heart Disease Prevention   Your inflammatory disease increases your risk of heart disease which includes heart attack, stroke, atrial fibrillation (irregular heartbeats), high blood pressure,  heart failure and atherosclerosis (plaque in the arteries).  It is important to reduce your risk by:   Keep blood pressure, cholesterol, and blood sugar at healthy levels   Smoking Cessation   Maintain a healthy weight  BMI 20-25   Eat a healthy diet  Plenty of fresh fruit, vegetables, and whole grains  Limit saturated fats, foods high in sodium, and added sugars  DASH and Mediterranean diet   Increase physical activity  Recommend moderate physically activity for 150 minutes per week/ 30 minutes a day for five days a week These can be broken up into three separate ten-minute sessions during the day.   Reduce Stress  Meditation, slow breathing exercises, yoga, coloring books  Dental visits twice a year

## 2021-05-25 ENCOUNTER — Other Ambulatory Visit: Payer: Self-pay | Admitting: Family Medicine

## 2021-05-25 DIAGNOSIS — M0579 Rheumatoid arthritis with rheumatoid factor of multiple sites without organ or systems involvement: Secondary | ICD-10-CM

## 2021-06-01 ENCOUNTER — Other Ambulatory Visit: Payer: Self-pay | Admitting: Family Medicine

## 2021-06-01 DIAGNOSIS — M792 Neuralgia and neuritis, unspecified: Secondary | ICD-10-CM

## 2021-06-01 DIAGNOSIS — M5432 Sciatica, left side: Secondary | ICD-10-CM

## 2021-06-01 DIAGNOSIS — I10 Essential (primary) hypertension: Secondary | ICD-10-CM

## 2021-06-04 ENCOUNTER — Other Ambulatory Visit (HOSPITAL_COMMUNITY): Payer: Self-pay

## 2021-06-09 ENCOUNTER — Other Ambulatory Visit: Payer: Self-pay | Admitting: Physician Assistant

## 2021-06-10 ENCOUNTER — Other Ambulatory Visit (HOSPITAL_COMMUNITY): Payer: Self-pay

## 2021-06-17 ENCOUNTER — Ambulatory Visit (INDEPENDENT_AMBULATORY_CARE_PROVIDER_SITE_OTHER): Payer: Medicare Other

## 2021-06-17 VITALS — Ht 63.0 in | Wt 215.0 lb

## 2021-06-17 DIAGNOSIS — Z0000 Encounter for general adult medical examination without abnormal findings: Secondary | ICD-10-CM

## 2021-06-17 NOTE — Progress Notes (Signed)
Subjective:   Jean Howell is a 65 y.o. female who presents for an Initial Medicare Annual Wellness Visit.  Virtual Visit via Telephone Note  I connected with  Jean Howell on 06/17/21 at  3:30 PM EDT by telephone and verified that I am speaking with the correct person using two identifiers.  Location: Patient: Home Provider: [facility] Persons participating in the virtual visit: patient/Nurse Health Advisor   I discussed the limitations, risks, security and privacy concerns of performing an evaluation and management service by telephone and the availability of in person appointments. The patient expressed understanding and agreed to proceed.  Interactive audio and video telecommunications were attempted between this nurse and patient, however failed, due to patient having technical difficulties OR patient did not have access to video capability.  We continued and completed visit with audio only.  Some vital signs may be absent or patient reported.   Jean Howell E Jean Michiels, LPN   Review of Systems     Cardiac Risk Factors include: advanced age (>73men, >1 women);sedentary lifestyle;dyslipidemia;obesity (BMI >30kg/m2);hypertension     Objective:    Today's Vitals   06/17/21 1524  Weight: 215 lb (97.5 kg)  Height: 5\' 3"  (1.6 m)  PainSc: 5    Body mass index is 38.09 kg/m.  Advanced Directives 06/17/2021 06/16/2020 10/24/2019 06/25/2019  Does Patient Have a Medical Advance Directive? No No No No  Would patient like information on creating a medical advance directive? No - Patient declined No - Patient declined - No - Patient declined    Current Medications (verified) Outpatient Encounter Medications as of 06/17/2021  Medication Sig   Adalimumab (HUMIRA PEN) 40 MG/0.4ML PNKT INJECT 40 MG INTO THE SKIN EVERY 14 (FOURTEEN) DAYS.   amitriptyline (ELAVIL) 25 MG tablet TAKE ONE TABLET DAILY AT BEDTIME   CALCIUM PO Take by mouth daily.   Cyanocobalamin (VITAMIN B 12 PO) Take 1 tablet by  mouth daily.   gabapentin (NEURONTIN) 300 MG capsule Take 300 mg by mouth daily.   gabapentin (NEURONTIN) 400 MG capsule Take 1 capsule (400 mg total) by mouth at bedtime.   leflunomide (ARAVA) 20 MG tablet TAKE ONE (1) TABLET EACH DAY   losartan-hydrochlorothiazide (HYZAAR) 100-12.5 MG tablet TAKE ONE (1) TABLET EACH DAY   Multiple Vitamin (MULTIVITAMIN PO) Take 1 tablet by mouth daily.   VITAMIN D PO Take 1 tablet by mouth daily.   No facility-administered encounter medications on file as of 06/17/2021.    Allergies (verified) Patient has no known allergies.   History: Past Medical History:  Diagnosis Date   Arthritis    Depression    Hypertension    Rheumatoid arthritis ([place])    Past Surgical History:  Procedure Laterality Date   BREAST SURGERY     nodules removed   COLONOSCOPY N/A 06/25/2019   Procedure: COLONOSCOPY;  Surgeon: Danie Binder, MD;  [facility];  Service: Endoscopy;  Laterality: N/A;  1:30   POLYPECTOMY  06/25/2019   Procedure: POLYPECTOMY;  Surgeon: Danie Binder, MD;  Location: [facility];  Service: Endoscopy;;   Family History  Problem Relation Age of Onset   Cancer Father    Colon cancer Father    Thyroid disease Mother    CVA Mother 2   Thyroid disease Sister    Healthy Son    Congestive Heart Failure Sister    Hypertension Sister    Diabetes Sister    Hypertension Sister    Diabetes Sister    Hypertension  Sister    Hypertension Sister    Cancer Brother        kidney    Social History   Socioeconomic History   Marital status: Widowed    Spouse name: Not on file   Number of children: 1   Years of education: Not on file   Highest education level: GED or equivalent  Occupational History   Occupation: retired  Tobacco Use   Smoking status: Former    Packs/day: 1.00    Years: 15.00    Pack years: 15.00    Types: Cigarettes    Quit date: 2000    Years since quitting: 22.5   Smokeless tobacco: Never  Vaping Use    Vaping Use: Never used  Substance and Sexual Activity   Alcohol use: Not Currently   Drug use: Never   Sexual activity: Not Currently    Comment: widowed  Other Topics Concern   Not on file  Social History Narrative   Patient is a widow that resides independently in [place].  She has 1 son, who resides in [place].   Social Determinants of Health   Financial Resource Strain: Low Risk    Difficulty of Paying Living Expenses: Not very hard  Food Insecurity: No Food Insecurity   Worried About Charity fundraiser in the Last Year: Never true   Ran Out of Food in the Last Year: Never true  Transportation Needs: No Transportation Needs   Lack of Transportation (Medical): No   Lack of Transportation (Non-Medical): No  Physical Activity: Insufficiently Active   Days of Exercise per Week: 7 days   Minutes of Exercise per Session: 10 min  Stress: No Stress Concern Present   Feeling of Stress : Not at all  Social Connections: Moderately Integrated   Frequency of Communication with Friends and Family: More than three times a week   Frequency of Social Gatherings with Friends and Family: Once a week   Attends Religious Services: More than 4 times per year   Active Member of [employer] or Organizations: Yes   Attends Music therapist: More than 4 times per year   Marital Status: Never married    Tobacco Counseling Counseling given: Not Answered   Clinical Intake:  Pre-visit preparation completed: Yes  Pain : 0-10 Pain Score: 5  Pain Type: Chronic pain Pain Location: Generalized Pain Descriptors / Indicators: Aching, Tender, Sore Pain Onset: More than a month ago Pain Frequency: Constant     BMI - recorded: 38.09 Nutritional Status: BMI > 30  Obese Nutritional Risks: None Diabetes: No  How often do you need to have someone help you when you read instructions, pamphlets, or other written materials from your doctor or pharmacy?: 1 - Never  Diabetic?  No  Interpreter Needed?: No  Information entered by :: Jean Wellons, LPN   Activities of Daily Living In your present state of health, do you have any difficulty performing the following activities: 06/17/2021  Hearing? Y  Comment mild - ears get stopped up sometimes  Vision? N  Difficulty concentrating or making decisions? N  Walking or climbing stairs? Y  Dressing or bathing? N  Doing errands, shopping? Y  Preparing Food and eating ? N  Using the Toilet? N  In the past six months, have you accidently leaked urine? Y  Comment wears panty liners - too slow to make it to restroom at times  Do you have problems with loss of bowel control? N  Managing  your Medications? N  Managing your Finances? N  Housekeeping or managing your Housekeeping? N  Some recent data might be hidden    Patient Care Team: Jean Norlander, DO as PCP - General (Family Medicine) Jean Romney Cristopher Estimable, MD as Consulting Physician (Gastroenterology) Jean Merino, MD as Consulting Physician (Rheumatology)  Indicate any recent Medical Services you may have received from other than [facility] providers in the past year (date may be approximate).     Assessment:   This is a routine wellness examination for Jean Howell.  Hearing/Vision screen Hearing Screening - Comments:: Ears get stopped up sometimes causing things to sound muffled, but overall has no hearing difficulties Vision Screening - Comments:: Wears eyeglasses - up to date with annual eye exam with Dr Marin Comment in [place]  Dietary issues and exercise activities discussed: Current Exercise Habits: Home exercise routine, Type of exercise: walking, Time (Minutes): 10, Frequency (Times/Week): 7, Weekly Exercise (Minutes/Week): 70, Intensity: Mild, Exercise limited by: orthopedic condition(s)   Goals Addressed             This Visit's Progress    DIET - EAT MORE FRUITS AND VEGETABLES   On track    Prevent falls   On track      Depression Screen PHQ 2/9  Scores 06/17/2021 03/13/2021 09/16/2020 06/16/2020 05/14/2019 02/12/2019 01/08/2019  PHQ - 2 Score 0 0 - 4 0 0 0  PHQ- 9 Score - - - 7 0 0 0  Exception Documentation - - Other- indicate reason in comment box - - - -    Fall Risk Fall Risk  06/17/2021 03/13/2021 09/16/2020 06/16/2020 05/14/2019  Falls in the past year? 0 1 0 0 0  Number falls in past yr: 0 0 - - -  Injury with Fall? 0 0 - - -  Risk for fall due to : Impaired balance/gait;Orthopedic patient;Impaired vision Impaired mobility - - -  Follow up Falls prevention discussed Falls evaluation completed - - -    FALL RISK PREVENTION PERTAINING TO THE HOME:  Any stairs in or around the home? Yes  If so, are there any without handrails? No  Home free of loose throw rugs in walkways, pet beds, electrical cords, etc? Yes  Adequate lighting in your home to reduce risk of falls? Yes   ASSISTIVE DEVICES UTILIZED TO PREVENT FALLS:  Life alert? No  Use of a cane, walker or w/c? Yes  Grab bars in the bathroom? Yes  Shower chair or bench in shower? Yes  Elevated toilet seat or a handicapped toilet? Yes   TIMED UP AND GO:  Was the test performed? No . Telephonic visit  Cognitive Function: Normal cognitive status assessed by direct observation by this Nurse Health Advisor. No abnormalities found.   MMSE - Mini Mental State Exam 06/16/2020  Orientation to time 5  Orientation to Place 5  Registration 3  Attention/ Calculation 5  Recall 3  Language- name 2 objects 2  Language- repeat 1  Language- follow 3 step command 3  Language- read & follow direction 1  Write a sentence 1  Copy design 1  Total score 30        Immunizations Immunization History  Administered Date(s) Administered   Fluad Quad(high Dose 65+) 09/16/2020   Influenza, Quadrivalent, Recombinant, Inj, Pf 09/19/2019   PFIZER(Purple Top)SARS-COV-2 Vaccination 05/23/2020, 06/13/2020, 07/31/2020, 02/28/2021    TDAP status: Due, Education has been provided regarding the  importance of this vaccine. Advised may receive this vaccine at local pharmacy or  Health Dept. Aware to provide a copy of the vaccination record if obtained from local pharmacy or Health Dept. Verbalized acceptance and understanding.  Flu Vaccine status: Up to date  Pneumococcal vaccine status: Due, Education has been provided regarding the importance of this vaccine. Advised may receive this vaccine at local pharmacy or Health Dept. Aware to provide a copy of the vaccination record if obtained from local pharmacy or Health Dept. Verbalized acceptance and understanding.  Covid-19 vaccine status: Completed vaccines  Qualifies for Shingles Vaccine? Yes   Zostavax completed No   Shingrix Completed?: No.    Education has been provided regarding the importance of this vaccine. Patient has been advised to call insurance company to determine out of pocket expense if they have not yet received this vaccine. Advised may also receive vaccine at local pharmacy or Health Dept. Verbalized acceptance and understanding.  Screening Tests Health Maintenance  Topic Date Due   TETANUS/TDAP  Never done   Zoster Vaccines- Shingrix (1 of 2) Never done   PNA vac Low Risk Adult (1 of 2 - PCV13) Never done   MAMMOGRAM  01/30/2021   COVID-19 Vaccine (5 - Booster for Pfizer series) 06/30/2021   INFLUENZA VACCINE  07/06/2021   DEXA SCAN  09/16/2022   COLONOSCOPY (Pts 45-70yrs Insurance coverage will need to be confirmed)  06/24/2029   Hepatitis C Screening  Completed   HPV VACCINES  Aged Out    Health Maintenance  Health Maintenance Due  Topic Date Due   TETANUS/TDAP  Never done   Zoster Vaccines- Shingrix (1 of 2) Never done   PNA vac Low Risk Adult (1 of 2 - PCV13) Never done   MAMMOGRAM  01/30/2021    Colorectal cancer screening: Type of screening: Colonoscopy. Completed 06/25/2019. Repeat every 10 years  Mammogram status: Ordered 06/2021. Pt provided with contact info and advised to call to schedule  appt.   Bone Density status: Completed 09/16/2020. Results reflect: Bone density results: OSTEOPENIA. Repeat every 2 years.  Lung Cancer Screening: (Low Dose CT Chest recommended if Age 71-80 years, 30 pack-year currently smoking OR have quit w/in 15years.) does not qualify.   Additional Screening:  Hepatitis C Screening: does qualify; Completed 01/08/2019  Vision Screening: Recommended annual ophthalmology exams for early detection of glaucoma and other disorders of the eye. Is the patient up to date with their annual eye exam?  Yes  Who is the provider or what is the name of the office in which the patient attends annual eye exams? Dr Marin Comment If pt is not established with a provider, would they like to be referred to a provider to establish care? No .   Dental Screening: Recommended annual dental exams for proper oral hygiene  Community Resource Referral / Chronic Care Management: CRR required this visit?  No   CCM required this visit?  No      Plan:     I have personally reviewed and noted the following in the patient's chart:   Medical and social history Use of alcohol, tobacco or illicit drugs  Current medications and supplements including opioid prescriptions. Patient is not currently taking opioid prescriptions. Functional ability and status Nutritional status Physical activity Advanced directives List of other physicians Hospitalizations, surgeries, and ER visits in previous 12 months Vitals Screenings to include cognitive, depression, and falls Referrals and appointments  In addition, I have reviewed and discussed with patient certain preventive protocols, quality metrics, and best practice recommendations. A written personalized care plan for preventive  services as well as general preventive health recommendations were provided to patient.     Sandrea Hammond, LPN   [id number]   Nurse Notes: None

## 2021-06-17 NOTE — Patient Instructions (Signed)
Jean Howell , Thank you for taking time to come for your Medicare Wellness Visit. I appreciate your ongoing commitment to your health goals. Please review the following plan we discussed and let me know if I can assist you in the future.   Screening recommendations/referrals: Colonoscopy: Done 06/25/2019 - Repeat in 10 years  Mammogram: Done 01/30/2019 - Keep appointment for 7/27 - Repeat annually  Bone Density: Done 09/16/2020 - Repeat every 2 years  Recommended yearly ophthalmology/optometry visit for glaucoma screening and checkup Recommended yearly dental visit for hygiene and checkup  Vaccinations: Influenza vaccine: Done 09/16/2020 - Repeat annually  Pneumococcal vaccine: Due Tdap vaccine: Due Shingles vaccine: Due   Covid-19: Done 06/13/20, 07/31/20, & 02/28/21  Advanced directives: Please bring a copy of your health care power of attorney and living will to the office to be added to your chart at your convenience.   Conditions/risks identified: Aim for 30 minutes of exercise or brisk walking each day, drink 6-8 glasses of water and eat lots of fruits and vegetables.   Next appointment: Follow up in one year for your annual wellness visit    Preventive Care 65 Years and Older, Female Preventive care refers to lifestyle choices and visits with your health care provider that can promote health and wellness. What does preventive care include? A yearly physical exam. This is also called an annual well check. Dental exams once or twice a year. Routine eye exams. Ask your health care provider how often you should have your eyes checked. Personal lifestyle choices, including: Daily care of your teeth and gums. Regular physical activity. Eating a healthy diet. Avoiding tobacco and drug use. Limiting alcohol use. Practicing safe sex. Taking low-dose aspirin every day. Taking vitamin and mineral supplements as recommended by your health care provider. What happens during an annual well  check? The services and screenings done by your health care provider during your annual well check will depend on your age, overall health, lifestyle risk factors, and family history of disease. Counseling  Your health care provider may ask you questions about your: Alcohol use. Tobacco use. Drug use. Emotional well-being. Home and relationship well-being. Sexual activity. Eating habits. History of falls. Memory and ability to understand (cognition). Work and work Statistician. Reproductive health. Screening  You may have the following tests or measurements: Height, weight, and BMI. Blood pressure. Lipid and cholesterol levels. These may be checked every 5 years, or more frequently if you are over 37 years old. Skin check. Lung cancer screening. You may have this screening every year starting at age 50 if you have a 30-pack-year history of smoking and currently smoke or have quit within the past 15 years. Fecal occult blood test (FOBT) of the stool. You may have this test every year starting at age 50. Flexible sigmoidoscopy or colonoscopy. You may have a sigmoidoscopy every 5 years or a colonoscopy every 10 years starting at age 17. Hepatitis C blood test. Hepatitis B blood test. Sexually transmitted disease (STD) testing. Diabetes screening. This is done by checking your blood sugar (glucose) after you have not eaten for a while (fasting). You may have this done every 1-3 years. Bone density scan. This is done to screen for osteoporosis. You may have this done starting at age 54. Mammogram. This may be done every 1-2 years. Talk to your health care provider about how often you should have regular mammograms. Talk with your health care provider about your test results, treatment options, and if necessary, the need  for more tests. Vaccines  Your health care provider may recommend certain vaccines, such as: Influenza vaccine. This is recommended every year. Tetanus, diphtheria, and  acellular pertussis (Tdap, Td) vaccine. You may need a Td booster every 10 years. Zoster vaccine. You may need this after age 1. Pneumococcal 13-valent conjugate (PCV13) vaccine. One dose is recommended after age 43. Pneumococcal polysaccharide (PPSV23) vaccine. One dose is recommended after age 68. Talk to your health care provider about which screenings and vaccines you need and how often you need them. This information is not intended to replace advice given to you by your health care provider. Make sure you discuss any questions you have with your health care provider. Document Released: 12/19/2015 Document Revised: 08/11/2016 Document Reviewed: 09/23/2015 Elsevier Interactive Patient Education  2017 [place] Prevention in the Home Falls can cause injuries. They can happen to people of all ages. There are many things you can do to make your home safe and to help prevent falls. What can I do on the outside of my home? Regularly fix the edges of walkways and driveways and fix any cracks. Remove anything that might make you trip as you walk through a door, such as a raised step or threshold. Trim any bushes or trees on the path to your home. Use bright outdoor lighting. Clear any walking paths of anything that might make someone trip, such as rocks or tools. Regularly check to see if handrails are loose or broken. Make sure that both sides of any steps have handrails. Any raised decks and porches should have guardrails on the edges. Have any leaves, snow, or ice cleared regularly. Use sand or salt on walking paths during winter. Clean up any spills in your garage right away. This includes oil or grease spills. What can I do in the bathroom? Use night lights. Install grab bars by the toilet and in the tub and shower. Do not use towel bars as grab bars. Use non-skid mats or decals in the tub or shower. If you need to sit down in the shower, use a plastic, non-slip stool. Keep  the floor dry. Clean up any water that spills on the floor as soon as it happens. Remove soap buildup in the tub or shower regularly. Attach bath mats securely with double-sided non-slip rug tape. Do not have throw rugs and other things on the floor that can make you trip. What can I do in the bedroom? Use night lights. Make sure that you have a light by your bed that is easy to reach. Do not use any sheets or blankets that are too big for your bed. They should not hang down onto the floor. Have a firm chair that has side arms. You can use this for support while you get dressed. Do not have throw rugs and other things on the floor that can make you trip. What can I do in the kitchen? Clean up any spills right away. Avoid walking on wet floors. Keep items that you use a lot in easy-to-reach places. If you need to reach something above you, use a strong step stool that has a grab bar. Keep electrical cords out of the way. Do not use floor polish or wax that makes floors slippery. If you must use wax, use non-skid floor wax. Do not have throw rugs and other things on the floor that can make you trip. What can I do with my stairs? Do not leave any items on the stairs.  Make sure that there are handrails on both sides of the stairs and use them. Fix handrails that are broken or loose. Make sure that handrails are as long as the stairways. Check any carpeting to make sure that it is firmly attached to the stairs. Fix any carpet that is loose or worn. Avoid having throw rugs at the top or bottom of the stairs. If you do have throw rugs, attach them to the floor with carpet tape. Make sure that you have a light switch at the top of the stairs and the bottom of the stairs. If you do not have them, ask someone to add them for you. What else can I do to help prevent falls? Wear shoes that: Do not have high heels. Have rubber bottoms. Are comfortable and fit you well. Are closed at the toe. Do not  wear sandals. If you use a stepladder: Make sure that it is fully opened. Do not climb a closed stepladder. Make sure that both sides of the stepladder are locked into place. Ask someone to hold it for you, if possible. Clearly mark and make sure that you can see: Any grab bars or handrails. First and last steps. Where the edge of each step is. Use tools that help you move around (mobility aids) if they are needed. These include: Canes. Walkers. Scooters. Crutches. Turn on the lights when you go into a dark area. Replace any light bulbs as soon as they burn out. Set up your furniture so you have a clear path. Avoid moving your furniture around. If any of your floors are uneven, fix them. If there are any pets around you, be aware of where they are. Review your medicines with your doctor. Some medicines can make you feel dizzy. This can increase your chance of falling. Ask your doctor what other things that you can do to help prevent falls. This information is not intended to replace advice given to you by your health care provider. Make sure you discuss any questions you have with your health care provider. Document Released: 09/18/2009 Document Revised: 04/29/2016 Document Reviewed: 12/27/2014 Elsevier Interactive Patient Education  2017 [employer].

## 2021-07-01 ENCOUNTER — Ambulatory Visit
Admission: RE | Admit: 2021-07-01 | Discharge: 2021-07-01 | Disposition: A | Discharge status: Home / Self Care | Payer: Medicare Other | Source: Ambulatory Visit | Attending: Family Medicine | Admitting: Family Medicine

## 2021-07-01 ENCOUNTER — Other Ambulatory Visit: Payer: Self-pay

## 2021-07-01 ENCOUNTER — Encounter: Payer: Self-pay | Admitting: Family Medicine

## 2021-07-01 ENCOUNTER — Ambulatory Visit (INDEPENDENT_AMBULATORY_CARE_PROVIDER_SITE_OTHER): Payer: Medicare Other | Admitting: Family Medicine

## 2021-07-01 VITALS — BP 103/75 | HR 83 | Temp 97.9°F

## 2021-07-01 DIAGNOSIS — R55 Syncope and collapse: Secondary | ICD-10-CM

## 2021-07-01 DIAGNOSIS — Z1231 Encounter for screening mammogram for malignant neoplasm of breast: Secondary | ICD-10-CM

## 2021-07-01 DIAGNOSIS — R4020 Unspecified coma: Secondary | ICD-10-CM | POA: Diagnosis not present

## 2021-07-01 NOTE — Progress Notes (Signed)
Subjective: CC: Loss of consciousness PCP: [name], DO [id number] Swink is a 65 y.o. female presenting to clinic today for:  1.  Loss of consciousness Patient reports that she was getting her mammogram done just a few minutes ago and reportedly lost consciousness.  She had started becoming overheated and was having difficulty holding her breath with the mask on and a small area.  She notes that she became dizzy and asked to be seated down and apparently during that time she became somewhat obtunded.  She notes that this is not the first time that this is happened and very frequently happens when she gets over heated.  She can account for at least 4 episodes in the last several years that similar has occurred.  No chest pain, no shortness of breath, no heart palpitations or racing.  She does not have any bleeding.  She denies any difficulty with speech, vision, unilateral weakness or sensory changes.   ROS: Per HPI  No Known Allergies Past Medical History:  Diagnosis Date   Arthritis    Depression    Hypertension    Rheumatoid arthritis ([place])     Current Outpatient Medications:    Adalimumab (HUMIRA PEN) 40 MG/0.4ML PNKT, INJECT 40 MG INTO THE SKIN EVERY 14 (FOURTEEN) DAYS., Disp: 6 each, Rfl: 0   amitriptyline (ELAVIL) 25 MG tablet, TAKE ONE TABLET DAILY AT BEDTIME, Disp: 90 tablet, Rfl: 0   CALCIUM PO, Take by mouth daily., Disp: , Rfl:    Cyanocobalamin (VITAMIN B 12 PO), Take 1 tablet by mouth daily., Disp: , Rfl:    gabapentin (NEURONTIN) 300 MG capsule, Take 300 mg by mouth daily., Disp: , Rfl:    gabapentin (NEURONTIN) 400 MG capsule, Take 1 capsule (400 mg total) by mouth at bedtime., Disp: 90 capsule, Rfl: 3   leflunomide (ARAVA) 20 MG tablet, TAKE ONE (1) TABLET EACH DAY, Disp: 90 tablet, Rfl: 0   losartan-hydrochlorothiazide (HYZAAR) 100-12.5 MG tablet, TAKE ONE (1) TABLET EACH DAY, Disp: 90 tablet, Rfl: 0   Multiple Vitamin (MULTIVITAMIN PO), Take 1 tablet by  mouth daily., Disp: , Rfl:    VITAMIN D PO, Take 1 tablet by mouth daily., Disp: , Rfl:  Social History   Socioeconomic History   Marital status: Widowed    Spouse name: Not on file   Number of children: 1   Years of education: Not on file   Highest education level: GED or equivalent  Occupational History   Occupation: retired  Tobacco Use   Smoking status: Former    Packs/day: 1.00    Years: 15.00    Pack years: 15.00    Types: Cigarettes    Quit date: 2000    Years since quitting: 22.5   Smokeless tobacco: Never  Vaping Use   Vaping Use: Never used  Substance and Sexual Activity   Alcohol use: Not Currently   Drug use: Never   Sexual activity: Not Currently    Comment: widowed  Other Topics Concern   Not on file  Social History Narrative   Patient is a widow that resides independently in [place].  She has 1 son, who resides in [place].   Social Determinants of Health   Financial Resource Strain: Low Risk    Difficulty of Paying Living Expenses: Not very hard  Food Insecurity: No Food Insecurity   Worried About Running Out of Food in the Last Year: Never true   Ran Out of Food in the Last Year:  Never true  Transportation Needs: No Transportation Needs   Lack of Transportation (Medical): No   Lack of Transportation (Non-Medical): No  Physical Activity: Insufficiently Active   Days of Exercise per Week: 7 days   Minutes of Exercise per Session: 10 min  Stress: No Stress Concern Present   Feeling of Stress : Not at all  Social Connections: Moderately Integrated   Frequency of Communication with Friends and Family: More than three times a week   Frequency of Social Gatherings with Friends and Family: Once a week   Attends Religious Services: More than 4 times per year   Active Member of [employer] or Organizations: Yes   Attends Music therapist: More than 4 times per year   Marital Status: Never married  Human resources officer Violence: Not At Risk    Fear of Current or Ex-Partner: No   Emotionally Abused: No   Physically Abused: No   Sexually Abused: No   Family History  Problem Relation Age of Onset   Cancer Father    Colon cancer Father    Thyroid disease Mother    CVA Mother 45   Thyroid disease Sister    Healthy Son    Congestive Heart Failure Sister    Hypertension Sister    Diabetes Sister    Hypertension Sister    Diabetes Sister    Hypertension Sister    Hypertension Sister    Cancer Brother        kidney     Objective: Office vital signs reviewed. BP 103/75   Pulse 83   Temp 97.9 F (36.6 C)   SpO2 94%   Physical Examination:  General: Awake, alert, obese, No acute distress HEENT: Normal; sclera white.  No conjunctival pallor Cardio: regular rate and rhythm, S1S2 heard, no murmurs appreciated Pulm: Faint crackles noted at the bases bilaterally.  Normal work of breathing on room air MSK: Utilizes rolling walker for ambulation.  Has wrist braces on bilateral wrists Neuro: 4/5 UE and LE Strength and light touch sensation grossly intact.  Cranial nerves II through XII grossly intact.  Normal cerebellar testing.  No data found.   Assessment/ Plan: 65 y.o. female   Vasovagal syncope  Loss of consciousness ([place]) - Plan: EKG 12-Lead  I suspect this is a vasovagal syncope based on prodrome.  EKG showed some nonspecific T wave changes but really no arrhythmia or other concerning findings.  Her blood pressure is a little on the low side so have asked that she hold off on her blood pressure medication for the next couple of days.  She did have a 13 point drop in systolic BP from lying to sitting. She will continue monitoring blood pressures at home with goal for less than 150/90.  Would like her to have formal recheck with nurse on Monday, sooner if concerns arise.  We discussed that if symptoms are to recur again, low threshold to be evaluated in the ER.  Wonder she might benefit from table tilt  testing     Orders Placed This Encounter  Procedures   EKG 12-Lead   No orders of the defined types were placed in this encounter.    [name], DO [place] [phone]

## 2021-07-01 NOTE — Patient Instructions (Signed)
This sounds like vasovagal syncope (fainting).  Your EKG looked ok.  If it happens again, please go directly to the ER.  HOLD your blood pressure med (losartan) for a couple of days.  Watch your blood pressure. Get BP checked with the nurse on Monday.  If it is good without meds, we might stop your blood pressure medication.  Syncope  Syncope refers to a condition in which a person temporarily loses consciousness. Syncope may also be called fainting or passing out. It is caused by a sudden decrease in blood flow to the brain. Even though most causes of syncope are not dangerous, syncope can be a sign of a serious medical problem. Your health care provider may do tests to find the reason why you are havingsyncope. Signs that you may be about to faint include: Feeling dizzy or light-headed. Feeling nauseous. Seeing all white or all black in your field of vision. Having cold, clammy skin. If you faint, get medical help right away. Call your local emergency services (911 in the [place] Do not drive yourself to the hospital. Follow these instructions at home: Pay attention to any changes in your symptoms. Take these actions to stay safeand to help relieve your symptoms: Lifestyle Do not drive, use machinery, or play sports until your health care provider says it is okay. Do not drink alcohol. Do not use any products that contain nicotine or tobacco, such as cigarettes and e-cigarettes. If you need help quitting, ask your health care provider. Drink enough fluid to keep your urine pale yellow. General instructions Take over-the-counter and prescription medicines only as told by your health care provider. If you are taking blood pressure or heart medicine, get up slowly and take several minutes to sit and then stand. This can reduce dizziness or light-headedness. Have someone stay with you until you feel stable. If you start to feel like you might faint, lie down right away and raise (elevate) your  feet above the level of your heart. Breathe deeply and steadily. Wait until all the symptoms have passed. Keep all follow-up visits as told by your health care provider. This is important. Get help right away if you: Have a severe headache. Faint once or repeatedly. Have pain in your chest, abdomen, or back. Have a very fast or irregular heartbeat (palpitations). Have pain when you breathe. Are bleeding from your mouth or rectum, or you have black or tarry stool. Have a seizure. Are confused. Have trouble walking. Have severe weakness. Have vision problems. These symptoms may represent a serious problem that is an emergency. Do not wait to see if your symptoms will go away. Get medical help right away. Call your local emergency services (911 in the [place] Do not drive yourself to the hospital. Summary Syncope refers to a condition in which a person temporarily loses consciousness. It is caused by a sudden decrease in blood flow to the brain. Signs that you may be about to faint include dizziness, feeling light-headed, feeling nauseous, sudden vision changes, or cold, clammy skin. Although most causes of syncope are not dangerous, syncope can be a sign of a serious medical problem. If you faint, get medical help right away. This information is not intended to replace advice given to you by your health care provider. Make sure you discuss any questions you have with your healthcare provider. Document Revised: 03/04/2020 Document Reviewed: 04/03/2020 Elsevier Patient Education  [place].

## 2021-07-06 ENCOUNTER — Other Ambulatory Visit (HOSPITAL_COMMUNITY): Payer: Self-pay

## 2021-07-06 ENCOUNTER — Other Ambulatory Visit: Payer: Self-pay

## 2021-07-06 ENCOUNTER — Ambulatory Visit (INDEPENDENT_AMBULATORY_CARE_PROVIDER_SITE_OTHER): Payer: Medicare Other | Admitting: *Deleted

## 2021-07-06 ENCOUNTER — Telehealth: Payer: Self-pay

## 2021-07-06 ENCOUNTER — Other Ambulatory Visit: Payer: Medicare Other

## 2021-07-06 VITALS — BP 139/83 | HR 90

## 2021-07-06 DIAGNOSIS — I10 Essential (primary) hypertension: Secondary | ICD-10-CM

## 2021-07-06 DIAGNOSIS — Z79899 Other long term (current) drug therapy: Secondary | ICD-10-CM

## 2021-07-06 DIAGNOSIS — M0579 Rheumatoid arthritis with rheumatoid factor of multiple sites without organ or systems involvement: Secondary | ICD-10-CM | POA: Diagnosis not present

## 2021-07-06 LAB — CBC WITH DIFFERENTIAL/PLATELET
Basophils Absolute: 0.1 10*3/uL (ref 0.0–0.2)
Basos: 1 %
EOS (ABSOLUTE): 0.3 10*3/uL (ref 0.0–0.4)
Eos: 5 %
Hematocrit: 36.7 % (ref 34.0–46.6)
Hemoglobin: 12 g/dL (ref 11.1–15.9)
Immature Grans (Abs): 0 10*3/uL (ref 0.0–0.1)
Immature Granulocytes: 0 %
Lymphocytes Absolute: 2.7 10*3/uL (ref 0.7–3.1)
Lymphs: 41 %
MCH: 27.9 pg (ref 26.6–33.0)
MCHC: 32.7 g/dL (ref 31.5–35.7)
MCV: 85 fL (ref 79–97)
Monocytes Absolute: 0.4 10*3/uL (ref 0.1–0.9)
Monocytes: 5 %
Neutrophils Absolute: 3.1 10*3/uL (ref 1.4–7.0)
Neutrophils: 48 %
Platelets: 339 10*3/uL (ref 150–450)
RBC: 4.3 x10E6/uL (ref 3.77–5.28)
RDW: 15.1 % (ref 11.7–15.4)
WBC: 6.5 10*3/uL (ref 3.4–10.8)

## 2021-07-06 LAB — CMP14+EGFR
ALT: 20 IU/L (ref 0–32)
AST: 23 IU/L (ref 0–40)
Albumin/Globulin Ratio: 0.9 — ABNORMAL LOW (ref 1.2–2.2)
Albumin: 3.2 g/dL — ABNORMAL LOW (ref 3.8–4.8)
Alkaline Phosphatase: 84 IU/L (ref 44–121)
BUN/Creatinine Ratio: 12 (ref 12–28)
BUN: 14 mg/dL (ref 8–27)
Bilirubin Total: 0.3 mg/dL (ref 0.0–1.2)
CO2: 22 mmol/L (ref 20–29)
Calcium: 9.1 mg/dL (ref 8.7–10.3)
Chloride: 105 mmol/L (ref 96–106)
Creatinine, Ser: 1.14 mg/dL — ABNORMAL HIGH (ref 0.57–1.00)
Globulin, Total: 3.6 g/dL (ref 1.5–4.5)
Glucose: 92 mg/dL (ref 65–99)
Potassium: 4.3 mmol/L (ref 3.5–5.2)
Sodium: 140 mmol/L (ref 134–144)
Total Protein: 6.8 g/dL (ref 6.0–8.5)
eGFR: 53 mL/min/{1.73_m2} — ABNORMAL LOW (ref >59)

## 2021-07-06 NOTE — Telephone Encounter (Signed)
Lab orders faxed.

## 2021-07-06 NOTE — Telephone Encounter (Signed)
Patient left a voicemail (Saturday, 07/04/21 at 10:30 am) requesting labwork orders be sent to [facility] in [place].  Patient states she has an appointment on Monday, 07/06/21.

## 2021-07-07 ENCOUNTER — Other Ambulatory Visit: Payer: Self-pay | Admitting: Rheumatology

## 2021-07-07 ENCOUNTER — Encounter: Payer: Self-pay | Admitting: Rheumatology

## 2021-07-07 MED ORDER — LEFLUNOMIDE 20 MG PO TABS: ORAL_TABLET | ORAL | 0 refills | Status: DC

## 2021-07-07 NOTE — Telephone Encounter (Signed)
Creatinine is elevated but stable most likely due to the use of diuretics.  We will forward results to her PCP.  Albumin is low due to chronic disease

## 2021-07-07 NOTE — Telephone Encounter (Signed)
Next Visit: 10/22/2021  Last Visit: 05/21/2021  Last Fill: 04/08/2021  DX: Rheumatoid arthritis involving multiple sites with positive rheumatoid factor  Current Dose per office note 05/21/2021: Arava 20 mg 1 tablet by mouth daily  Labs: 07/06/2021 Creatinine is elevated, most likely due to the use of Lasix.  CBC is normal.    Okay to refill Arava?

## 2021-07-07 NOTE — Telephone Encounter (Signed)
I called patient, labs forwarded to PCP, Jean Howell.

## 2021-07-07 NOTE — Progress Notes (Signed)
Creatinine is elevated, most likely due to the use of Lasix.  CBC is normal.  Please forward results to PCP.

## 2021-07-07 NOTE — Telephone Encounter (Signed)
Patient requesting refill on Leflunomide sent to [facility] in [place]. Patient request a 3 month supply, if possible.

## 2021-07-09 ENCOUNTER — Other Ambulatory Visit (HOSPITAL_COMMUNITY): Payer: Self-pay

## 2021-07-09 ENCOUNTER — Other Ambulatory Visit: Payer: Self-pay | Admitting: Rheumatology

## 2021-07-15 ENCOUNTER — Other Ambulatory Visit (HOSPITAL_COMMUNITY): Payer: Self-pay

## 2021-07-23 ENCOUNTER — Encounter: Payer: Self-pay | Admitting: *Deleted

## 2021-07-31 ENCOUNTER — Other Ambulatory Visit: Payer: Self-pay | Admitting: Physician Assistant

## 2021-07-31 ENCOUNTER — Other Ambulatory Visit (HOSPITAL_COMMUNITY): Payer: Self-pay

## 2021-07-31 DIAGNOSIS — M0579 Rheumatoid arthritis with rheumatoid factor of multiple sites without organ or systems involvement: Secondary | ICD-10-CM

## 2021-07-31 MED ORDER — HUMIRA (2 PEN) 40 MG/0.4ML ~~LOC~~ AJKT
40.0000 mg | AUTO-INJECTOR | SUBCUTANEOUS | 0 refills | Status: DC
Start: 2021-07-31 — End: 2021-10-22
  Filled 2021-07-31: qty 2, 28d supply, fill #0
  Filled 2021-08-31: qty 2, 28d supply, fill #1
  Filled 2021-09-22 – 2021-09-28 (×2): qty 2, 28d supply, fill #2

## 2021-07-31 NOTE — Telephone Encounter (Signed)
Next Visit: 10/22/2021  Last Visit: 05/21/2021  Last Fill: 05/12/2021  [id number] arthritis involving multiple sites with positive rheumatoid factor   Current Dose per office note 05/21/2021:  Humira 40 mg sq injections every 14 days  Labs: 07/06/2021, Creatinine is elevated, most likely due to the use of Lasix.  CBC is normal.  TB Gold: 01/07/2021, negative   Okay to refill Humira?

## 2021-08-05 ENCOUNTER — Other Ambulatory Visit (HOSPITAL_COMMUNITY): Payer: Self-pay

## 2021-08-17 ENCOUNTER — Other Ambulatory Visit: Payer: Self-pay | Admitting: Family Medicine

## 2021-08-17 DIAGNOSIS — M0579 Rheumatoid arthritis with rheumatoid factor of multiple sites without organ or systems involvement: Secondary | ICD-10-CM

## 2021-08-31 ENCOUNTER — Other Ambulatory Visit (HOSPITAL_COMMUNITY): Payer: Self-pay

## 2021-09-02 ENCOUNTER — Other Ambulatory Visit: Payer: Self-pay | Admitting: Family Medicine

## 2021-09-02 ENCOUNTER — Other Ambulatory Visit (HOSPITAL_COMMUNITY): Payer: Self-pay

## 2021-09-02 ENCOUNTER — Telehealth: Payer: Self-pay | Admitting: Family Medicine

## 2021-09-02 NOTE — Telephone Encounter (Signed)
Pt has 400 mg refills at [facility], verified this with pharmacy Pt aware pharmacy is getting this ready for her. Discontinued the 300 mg dose, since it was a historical med.

## 2021-09-02 NOTE — Telephone Encounter (Signed)
  Prescription Request  09/02/2021  Is this a "Controlled Substance" medicine? no Have you seen your PCP in the last 2 weeks? no If YES, route message to pool  -  If NO, patient needs to be scheduled for appointment.  What is the name of the medication or equipment? gabapentin (NEURONTIN) 400 MG capsule  Have you contacted your pharmacy to request a refill? yes  Which [facility] [place]   Patient notified that their request is being sent to the clinical staff for review and that they should receive a response within 2 business days.

## 2021-09-07 ENCOUNTER — Other Ambulatory Visit (HOSPITAL_COMMUNITY): Payer: Self-pay

## 2021-09-08 ENCOUNTER — Other Ambulatory Visit: Payer: Self-pay | Admitting: Family Medicine

## 2021-09-08 DIAGNOSIS — I10 Essential (primary) hypertension: Secondary | ICD-10-CM

## 2021-09-11 ENCOUNTER — Ambulatory Visit: Payer: Medicare Other | Admitting: Family Medicine

## 2021-09-15 ENCOUNTER — Ambulatory Visit (INDEPENDENT_AMBULATORY_CARE_PROVIDER_SITE_OTHER): Payer: Medicare Other | Admitting: Family Medicine

## 2021-09-15 ENCOUNTER — Other Ambulatory Visit: Payer: Self-pay

## 2021-09-15 ENCOUNTER — Encounter: Payer: Self-pay | Admitting: Family Medicine

## 2021-09-15 VITALS — BP 142/88 | HR 97 | Temp 97.4°F | Ht 63.0 in | Wt 219.2 lb

## 2021-09-15 DIAGNOSIS — Z7409 Other reduced mobility: Secondary | ICD-10-CM

## 2021-09-15 DIAGNOSIS — Z79899 Other long term (current) drug therapy: Secondary | ICD-10-CM | POA: Diagnosis not present

## 2021-09-15 DIAGNOSIS — M5432 Sciatica, left side: Secondary | ICD-10-CM | POA: Diagnosis not present

## 2021-09-15 DIAGNOSIS — Z5181 Encounter for therapeutic drug level monitoring: Secondary | ICD-10-CM

## 2021-09-15 DIAGNOSIS — I10 Essential (primary) hypertension: Secondary | ICD-10-CM

## 2021-09-15 DIAGNOSIS — M0579 Rheumatoid arthritis with rheumatoid factor of multiple sites without organ or systems involvement: Secondary | ICD-10-CM

## 2021-09-15 DIAGNOSIS — D84821 Immunodeficiency due to drugs: Secondary | ICD-10-CM | POA: Diagnosis not present

## 2021-09-15 MED ORDER — AMITRIPTYLINE HCL 25 MG PO TABS: 25.0000 mg | ORAL_TABLET | Freq: Every day | ORAL | 2 refills | Status: DC

## 2021-09-15 MED ORDER — LOSARTAN POTASSIUM-HCTZ 100-12.5 MG PO TABS: ORAL_TABLET | ORAL | 2 refills | Status: DC

## 2021-09-15 NOTE — Progress Notes (Signed)
Subjective: CC: Joint pain PCP: [name], DO [id number] Jean Howell is a 65 y.o. female presenting to clinic today for:  1.  Polyarthralgia/ HTN/ RA Patient with known rheumatoid arthritis that she is treated with immunologic's for this.  Her rheumatologist recommended dermatologic referral and she is asking for this today.  Denies any specific lesions of concern.  She also asked that she has a referral to physical therapy to help with mobility.  She suffers from chronic low back, hip and knee pains and would like to try and get stronger.  Denies any recent falls but she has had some unsteady gait in the past.  She is had no recurrence of loss of consciousness but does admit that some positions cause intermittent dizziness.  She admits she has not been changing positions as slowly as she should be though.  She reports chronic fatigue but admits that she stays up all night watching TV and then sleeps all day.  ROS: Per HPI  No Known Allergies Past Medical History:  Diagnosis Date   Arthritis    Depression    Hypertension    Rheumatoid arthritis ([place])     Current Outpatient Medications:    Adalimumab (HUMIRA PEN) 40 MG/0.4ML PNKT, INJECT 40 MG INTO THE SKIN EVERY 14 (FOURTEEN) DAYS., Disp: 6 each, Rfl: 0   amitriptyline (ELAVIL) 25 MG tablet, TAKE ONE TABLET DAILY AT BEDTIME, Disp: 90 tablet, Rfl: 0   CALCIUM PO, Take by mouth daily., Disp: , Rfl:    Cyanocobalamin (VITAMIN B 12 PO), Take 1 tablet by mouth daily., Disp: , Rfl:    gabapentin (NEURONTIN) 400 MG capsule, Take 1 capsule (400 mg total) by mouth at bedtime., Disp: 90 capsule, Rfl: 3   leflunomide (ARAVA) 20 MG tablet, TAKE ONE (1) TABLET EACH DAY, Disp: 90 tablet, Rfl: 0   losartan-hydrochlorothiazide (HYZAAR) 100-12.5 MG tablet, TAKE ONE (1) TABLET EACH DAY, Disp: 90 tablet, Rfl: 0   Multiple Vitamin (MULTIVITAMIN PO), Take 1 tablet by mouth daily., Disp: , Rfl:    VITAMIN D PO, Take 1 tablet by mouth daily.,  Disp: , Rfl:  Social History   Socioeconomic History   Marital status: Widowed    Spouse name: Not on file   Number of children: 1   Years of education: Not on file   Highest education level: GED or equivalent  Occupational History   Occupation: retired  Tobacco Use   Smoking status: Former    Packs/day: 1.00    Years: 15.00    Pack years: 15.00    Types: Cigarettes    Quit date: 2000    Years since quitting: 22.7   Smokeless tobacco: Never  Vaping Use   Vaping Use: Never used  Substance and Sexual Activity   Alcohol use: Not Currently   Drug use: Never   Sexual activity: Not Currently    Comment: widowed  Other Topics Concern   Not on file  Social History Narrative   Patient is a widow that resides independently in [place].  She has 1 son, who resides in [place].   Social Determinants of Health   Financial Resource Strain: Low Risk    Difficulty of Paying Living Expenses: Not very hard  Food Insecurity: No Food Insecurity   Worried About Charity fundraiser in the Last Year: Never true   Ran Out of Food in the Last Year: Never true  Transportation Needs: No Transportation Needs   Lack of Transportation (Medical): No  Lack of Transportation (Non-Medical): No  Physical Activity: Insufficiently Active   Days of Exercise per Week: 7 days   Minutes of Exercise per Session: 10 min  Stress: No Stress Concern Present   Feeling of Stress : Not at all  Social Connections: Moderately Integrated   Frequency of Communication with Friends and Family: More than three times a week   Frequency of Social Gatherings with Friends and Family: Once a week   Attends Religious Services: More than 4 times per year   Active Member of [employer] or Organizations: Yes   Attends Music therapist: More than 4 times per year   Marital Status: Never married  Human resources officer Violence: Not At Risk   Fear of Current or Ex-Partner: No   Emotionally Abused: No   Physically  Abused: No   Sexually Abused: No   Family History  Problem Relation Age of Onset   Cancer Father    Colon cancer Father    Thyroid disease Mother    CVA Mother 18   Thyroid disease Sister    Healthy Son    Congestive Heart Failure Sister    Hypertension Sister    Diabetes Sister    Hypertension Sister    Diabetes Sister    Hypertension Sister    Hypertension Sister    Cancer Brother        kidney     Objective: Office vital signs reviewed. BP (!) 142/88   Pulse 97   Temp (!) 97.4 F (36.3 C)   Ht 5\' 3"  (1.6 m)   Wt 219 lb 3.2 oz (99.4 kg)   SpO2 96%   BMI 38.83 kg/m   Physical Examination:  General: Awake, alert, well nourished, No acute distress HEENT: Normal; sclera white Cardio: regular rate and rhythm, S1S2 heard, no murmurs appreciated Pulm: clear to auscultation bilaterally, no wheezes, rhonchi or rales; normal work of breathing on room air MSK: Ambulating independently.  Has wrist braces in place bilaterally  Assessment/ Plan: 65 y.o. female   Essential hypertension - Plan: losartan-hydrochlorothiazide (HYZAAR) 100-12.5 MG tablet  Rheumatoid arthritis involving multiple sites with positive rheumatoid factor ([place]) - Plan: Ambulatory referral to Dermatology, Ambulatory referral to Physical Therapy, amitriptyline (ELAVIL) 25 MG tablet  Sciatica of left side - Plan: Ambulatory referral to Physical Therapy  Impaired mobility and endurance - Plan: Ambulatory referral to Physical Therapy  Medication monitoring encounter - Plan: Ambulatory referral to Dermatology  Immunocompromised state due to drug therapy [facility]) - Plan: Ambulatory referral to Dermatology  BP at goal for age. No changes. No recurrent LOC.  Reinforced changing positions slowly  I have placed a referral to physical therapy to help with some of the impaired mobility and endurance that I suspect has a combination of OA and RA contributing.  She is currently treated with an immunologic and her  rheumatologist has recommended dermatologic evaluation so for that referral has also been placed.  We will plan to perform fasting lipid and other fasting labs at her annual physical in 6 months.  Reinforced need to fast for that appointment  No orders of the defined types were placed in this encounter.  No orders of the defined types were placed in this encounter.    [name], DO [place] [phone]

## 2021-09-21 ENCOUNTER — Ambulatory Visit: Payer: Medicare Other | Attending: Family Medicine | Admitting: Physical Therapy

## 2021-09-21 ENCOUNTER — Other Ambulatory Visit: Payer: Self-pay

## 2021-09-21 ENCOUNTER — Encounter: Payer: Self-pay | Admitting: Physical Therapy

## 2021-09-21 DIAGNOSIS — M0579 Rheumatoid arthritis with rheumatoid factor of multiple sites without organ or systems involvement: Secondary | ICD-10-CM | POA: Insufficient documentation

## 2021-09-21 DIAGNOSIS — M5442 Lumbago with sciatica, left side: Secondary | ICD-10-CM | POA: Diagnosis not present

## 2021-09-21 DIAGNOSIS — Z7409 Other reduced mobility: Secondary | ICD-10-CM | POA: Diagnosis not present

## 2021-09-21 DIAGNOSIS — M25561 Pain in right knee: Secondary | ICD-10-CM | POA: Diagnosis not present

## 2021-09-21 DIAGNOSIS — M25562 Pain in left knee: Secondary | ICD-10-CM | POA: Insufficient documentation

## 2021-09-21 DIAGNOSIS — G8929 Other chronic pain: Secondary | ICD-10-CM | POA: Diagnosis not present

## 2021-09-21 DIAGNOSIS — M5432 Sciatica, left side: Secondary | ICD-10-CM | POA: Insufficient documentation

## 2021-09-21 NOTE — Therapy (Signed)
[facility] [place], [place], [address] Phone: [phone]   Fax:  [phone]  Physical Therapy Evaluation  Patient Details  Name: Jean Howell MRN: [id number] Date of Birth: [birth year] Referring Provider (PT): Ronnie Doss DO.   Encounter Date: 09/21/2021   PT End of Session - 09/21/21 1218     Visit Number 1    Number of Visits 12    Date for PT Re-Evaluation 12/20/21    PT Start Time 0908    PT Stop Time 0944    PT Time Calculation (min) 36 min    Behavior During Therapy [facility] for tasks assessed/performed             Past Medical History:  Diagnosis Date   Arthritis    Depression    Hypertension    Rheumatoid arthritis ([place])     Past Surgical History:  Procedure Laterality Date   BREAST EXCISIONAL BIOPSY Bilateral    pt unsure when- benign   BREAST SURGERY     nodules removed   COLONOSCOPY N/A 06/25/2019   Procedure: COLONOSCOPY;  Surgeon: Danie Binder, MD;  [facility];  Service: Endoscopy;  Laterality: N/A;  1:30   POLYPECTOMY  06/25/2019   Procedure: POLYPECTOMY;  Surgeon: Danie Binder, MD;  [facility];  Service: Endoscopy;;    There were no vitals filed for this visit.    Subjective Assessment - 09/21/21 1224     Subjective COVID-19 screen performed prior to patient entering clinic.  The patient presents to the clinic today with c/o multiple joint pain which she states is do to RA.  Here CC is that of low back and knee pain.  Her right knee tends to hurt worse than her left.  She states she leads a relatively sedentary lifestyle due to pain.  She would really like to decrease her pain so that she feels like moving more.  She reports that standing and walking about 5 minutes produces near severe pain-levels.    Pertinent History RA, right TKA, HTN, osteopenia.    How long can you sit comfortably? Unlimited.    How long can you stand comfortably? 5 minutes.     How long can you walk comfortably? 5 minutes.    Patient Stated Goals See above.    Currently in Pain? Yes    Pain Score 6     Pain Location Back   Knees.   Pain Orientation Right;Left    Pain Descriptors / Indicators Aching    Pain Type Chronic pain    Pain Radiating Towards Left lower extremity.    Pain Onset More than a month ago    Pain Frequency Constant    Aggravating Factors  See above.    Pain Relieving Factors See above.                [facility] PT Assessment - 09/21/21 0001       Assessment   Medical Diagnosis RA involving multiple site, LBP and knee pain.    Referring Provider (PT) Ronnie Doss DO.    Onset Date/Surgical Date --   Ongoing for many years.     Precautions   Precaution Comments Supervised gait with cane.      Restrictions   Weight Bearing Restrictions No      Balance Screen   Has the patient fallen in the past 6 months Yes    Has the patient had a decrease in activity level  because of a fear of falling?  Yes    Is the patient reluctant to leave their home because of a fear of falling?  --   "Sometimes."     Home Environment   Living Environment Private residence    Living Arrangements --   Stairs.     Prior Function   Level of Independence Independent      Posture/Postural Control   Posture/Postural Control Postural limitations    Postural Limitations Rounded Shoulders;Forward head;Increased lumbar lordosis;Flexed trunk    Posture Comments Bilateral genu valgum.      Deep Tendon Reflexes   DTR Assessment Site Patella;Achilles    Patella DTR 0    Achilles DTR 0      ROM / Strength   AROM / PROM / Strength AROM;Strength      AROM   Overall AROM Comments Right active knee flexion is 105 degrees and left is 115 degrees.  Active lumbar flexion performed slowly to 75% of normal range and lumbar extension schived to the neutral position.      Strength   Overall Strength Comments Bilateral hip flexion and abduction is 4-/5, bilateral  quadriceps strength is 4/5 and bilateral ankle strength is 5/5.      Palpation   Palpation comment Patient reporting pain across her lower lumbar spine and diffusely over her bilateral erector spinae musculature.      Special Tests   Other special tests Equal leg lengths.  Pain reported with bilateral SLR testing.      Bed Mobility   Bed Mobility Supine to Sit    Supine to Sit Contact Guard/Touching assist      Transfers   Comments Sit to stand perfomed slowly with armrests.      Ambulation/Gait   Gait Pattern Decreased step length - right;Decreased step length - left;Decreased stride length;Trunk flexed    Gait Comments Patient uses a straight cane for safety.                        Objective measurements completed on examination: See above findings.       [facility] Adult PT Treatment/Exercise - 09/21/21 0001       Modalities   Modalities Moist Heat      Moist Heat Therapy   Number Minutes Moist Heat 10 Minutes    Moist Heat Location Lumbar Spine                          PT Long Term Goals - 09/21/21 1247       PT LONG TERM GOAL #1   Title Patient will be independent with HEP    Time 6    Period Weeks    Status New      PT LONG TERM GOAL #2   Title Patient will report ability to perform ADLs with low back and bilateral knee pain less than or equal to 4/10    Time 6    Period Weeks    Status New      PT LONG TERM GOAL #3   Title Patient will report ability to walk for 10 minutes or greater with low back pain and bilateral knee pain less than or equal to 4/10.    Time 6    Period Weeks    Status New      PT LONG TERM GOAL #4   Title Stand 15 minutes with pain not > 4/10.  Time 6    Period Weeks    Status New                    Plan - 09/21/21 1239     Clinical Impression Statement The patient presents to OPPT with a CC of low back and bilateral knee pain, right > left.  She is found to have palpable pain across  her lower lumbar regiona nd diffusely over her lumbar erector spinae musculature.  Her posture is remarkable for a loss of lumbar lordosis as she stands in a flexed trunk posture.  She reported pain with bilateral SLR testing.  Transitory movements are difficult for the patient and she states she can only standing and walking about 5 minutes due to high pain-levels.  She has chronic knee pain, right > left.  She would like to decrease her pain so thta she feels like moving and doing more as she leads a relatively sedenatry lifestyle.  Patient will benefit from skilled physical therapy intervention to address pain and deficits.    Personal Factors and Comorbidities Comorbidity 1;Comorbidity 2;Other    Comorbidities RA, right TKA, HTN, osteopenia.    Examination-Activity Limitations Other;Transfers;Locomotion Level    Examination-Participation Restrictions Other    Stability/Clinical Decision Making Evolving/Moderate complexity    Clinical Decision Making Low    Rehab Potential Good    PT Frequency 2x / week    PT Duration 6 weeks    PT Treatment/Interventions ADLs/Self Care Home Management;Cryotherapy;Electrical Stimulation;Ultrasound;Moist Heat;Gait training;Stair training;Functional mobility training;Therapeutic activities;Therapeutic exercise;Manual techniques;Patient/family education;Passive range of motion    PT Next Visit Plan STW/M to patient's lumbar musculature, Nustep, low-level core exercises, pain-free bilateral LE ther ex.    Consulted and Agree with Plan of Care Patient             Patient will benefit from skilled therapeutic intervention in order to improve the following deficits and impairments:  Abnormal gait, Decreased activity tolerance, Decreased mobility, Decreased range of motion, Decreased strength, Increased muscle spasms, Postural dysfunction, Pain  Visit Diagnosis: Chronic pain of right knee - Plan: PT plan of care cert/re-cert  Pain of left knee after injury -  Plan: PT plan of care cert/re-cert  Chronic pain of left knee - Plan: PT plan of care cert/re-cert  Chronic left-sided low back pain with left-sided sciatica - Plan: PT plan of care cert/re-cert     Problem List Patient Active Problem List   Diagnosis Date Noted   Full code status 06/16/2020   Impaired mobility and endurance 06/16/2020   Sciatica of left side 06/16/2020   Rheumatoid arthritis involving multiple sites with positive rheumatoid factor ([place]) 03/06/2019   Primary osteoarthritis of both knees 03/06/2019   Former smoker 03/06/2019   ANA positive 02/12/2019   Family history of thyroid disease 01/08/2019   Morbid obesity ([place]) 01/08/2019   Essential hypertension 01/08/2019   Family history of colon cancer 01/08/2019    Hadrian Yarbrough, [place], PT 09/21/2021, 12:50 PM  [facility] [address] [place], [place], [address] Phone: [phone]   Fax:  [phone]  Name: Jean Howell MRN: [id number] Date of Birth: Feb 09, 1956

## 2021-09-22 ENCOUNTER — Other Ambulatory Visit (HOSPITAL_COMMUNITY): Payer: Self-pay

## 2021-09-24 ENCOUNTER — Other Ambulatory Visit: Payer: Self-pay

## 2021-09-24 ENCOUNTER — Ambulatory Visit: Payer: Medicare Other

## 2021-09-24 DIAGNOSIS — M5432 Sciatica, left side: Secondary | ICD-10-CM | POA: Diagnosis not present

## 2021-09-24 DIAGNOSIS — M25561 Pain in right knee: Secondary | ICD-10-CM | POA: Diagnosis not present

## 2021-09-24 DIAGNOSIS — M0579 Rheumatoid arthritis with rheumatoid factor of multiple sites without organ or systems involvement: Secondary | ICD-10-CM | POA: Diagnosis not present

## 2021-09-24 DIAGNOSIS — M25562 Pain in left knee: Secondary | ICD-10-CM

## 2021-09-24 DIAGNOSIS — M5442 Lumbago with sciatica, left side: Secondary | ICD-10-CM | POA: Diagnosis not present

## 2021-09-24 DIAGNOSIS — G8929 Other chronic pain: Secondary | ICD-10-CM

## 2021-09-24 DIAGNOSIS — Z7409 Other reduced mobility: Secondary | ICD-10-CM | POA: Diagnosis not present

## 2021-09-24 NOTE — Therapy (Signed)
[facility] [place], [place], [address] Phone: [phone]   Fax:  [phone]  Physical Therapy Treatment  Patient Details  Name: Jean Howell MRN: [id number] Date of Birth: 30-Mar-1956 Referring Provider (PT): Ronnie Doss DO.   Encounter Date: 09/24/2021   PT End of Session - 09/24/21 0814     Visit Number 2    Number of Visits 12    Date for PT Re-Evaluation 12/20/21    PT Start Time 0900    PT Stop Time 0945    PT Time Calculation (min) 45 min    Activity Tolerance Patient tolerated treatment well;Patient limited by fatigue    Behavior During Therapy [facility] for tasks assessed/performed             Past Medical History:  Diagnosis Date   Arthritis    Depression    Hypertension    Rheumatoid arthritis ([place])     Past Surgical History:  Procedure Laterality Date   BREAST EXCISIONAL BIOPSY Bilateral    pt unsure when- benign   BREAST SURGERY     nodules removed   COLONOSCOPY N/A 06/25/2019   Procedure: COLONOSCOPY;  Surgeon: Danie Binder, MD;  [facility];  Service: Endoscopy;  Laterality: N/A;  1:30   POLYPECTOMY  06/25/2019   Procedure: POLYPECTOMY;  Surgeon: Danie Binder, MD;  [facility];  Service: Endoscopy;;    There were no vitals filed for this visit.   Subjective Assessment - 09/24/21 0852     Subjective COVID-19 screen performed prior to patient entering clinic. Patient reports that her back feels good today. However, her knees are hurting a little bit this morning (R>L).    Pertinent History RA, right TKA, HTN, osteopenia.    How long can you sit comfortably? Unlimited.    How long can you stand comfortably? 5 minutes.    How long can you walk comfortably? 5 minutes.    Patient Stated Goals See above.    Currently in Pain? Yes    Pain Score 6     Pain Orientation Right;Left    Pain Type Chronic pain    Pain Onset More than a month ago    Pain Frequency  Constant                               [facility] Adult PT Treatment/Exercise - 09/24/21 0001       Ambulation/Gait   Assistive device None      Exercises   Exercises Knee/Hip      Knee/Hip Exercises: Stretches   Gastroc Stretch 5 reps   15 seconds     Knee/Hip Exercises: Aerobic   Nustep L3 x 10 minutes; seat 11      Knee/Hip Exercises: Seated   Long Arc Quad Both;AROM   2 minutes   Other Seated Knee/Hip Exercises Heel/toe raises   2 minutes   Other Seated Knee/Hip Exercises Sidestepping   2 minutes (no resistance); 2 minutes (yellow t-band)   Marching Other (comment);Both   2 minutes                         PT Long Term Goals - 09/21/21 1247       PT LONG TERM GOAL #1   Title Patient will be independent with HEP    Time 6    Period Weeks  Status New      PT LONG TERM GOAL #2   Title Patient will report ability to perform ADLs with low back and bilateral knee pain less than or equal to 4/10    Time 6    Period Weeks    Status New      PT LONG TERM GOAL #3   Title Patient will report ability to walk for 10 minutes or greater with low back pain and bilateral knee pain less than or equal to 4/10.    Time 6    Period Weeks    Status New      PT LONG TERM GOAL #4   Title Stand 15 minutes with pain not > 4/10.    Time 6    Period Weeks    Status New                   Plan - 09/24/21 0920     Clinical Impression Statement Patient was progressed with multiple new interventions for improved lower extremity strength and mobility with moderate fatigue. She required minimal cuing with standing marching for upright stance and improved step height. Fatigue was her primary limiting factor with today's interventions as she required multiple seated rest breaks throughout treatment. She experienced no increase in low back pain or discomfort with any of today's interventions. She reported feeling tired, but that her knees felt a little  better upon the conclusion of treatment. She continues to benefit from skilled physical therapy to address her remaining impairments to safely return to her prior level of function.    Personal Factors and Comorbidities Comorbidity 1;Comorbidity 2;Other    Comorbidities RA, right TKA, HTN, osteopenia.    Examination-Activity Limitations Other;Transfers;Locomotion Level    Examination-Participation Restrictions Other    Stability/Clinical Decision Making Evolving/Moderate complexity    Rehab Potential Good    PT Frequency 2x / week    PT Duration 6 weeks    PT Treatment/Interventions ADLs/Self Care Home Management;Cryotherapy;Electrical Stimulation;Ultrasound;Moist Heat;Gait training;Stair training;Functional mobility training;Therapeutic activities;Therapeutic exercise;Manual techniques;Patient/family education;Passive range of motion    PT Next Visit Plan STW/M to patient's lumbar musculature, Nustep, low-level core exercises, pain-free bilateral LE ther ex.    Consulted and Agree with Plan of Care Patient             Patient will benefit from skilled therapeutic intervention in order to improve the following deficits and impairments:  Abnormal gait, Decreased activity tolerance, Decreased mobility, Decreased range of motion, Decreased strength, Increased muscle spasms, Postural dysfunction, Pain  Visit Diagnosis: Chronic pain of right knee  Pain of left knee after injury  Chronic pain of left knee  Chronic left-sided low back pain with left-sided sciatica     Problem List Patient Active Problem List   Diagnosis Date Noted   Full code status 06/16/2020   Impaired mobility and endurance 06/16/2020   Sciatica of left side 06/16/2020   Rheumatoid arthritis involving multiple sites with positive rheumatoid factor ([place]) 03/06/2019   Primary osteoarthritis of both knees 03/06/2019   Former smoker 03/06/2019   ANA positive 02/12/2019   Family history of thyroid disease 01/08/2019    Morbid obesity ([place]) 01/08/2019   Essential hypertension 01/08/2019   Family history of colon cancer 01/08/2019    Darlin Coco, PT 09/24/2021, 10:44 AM  [facility] [address] [place], [place], [address] Phone: [phone]   Fax:  [phone]  Name: Sallye Lunz MRN: [id number] Date of Birth: May 07, 1956

## 2021-09-28 ENCOUNTER — Other Ambulatory Visit (HOSPITAL_COMMUNITY): Payer: Self-pay

## 2021-09-28 ENCOUNTER — Ambulatory Visit: Payer: Medicare Other

## 2021-09-28 ENCOUNTER — Other Ambulatory Visit: Payer: Self-pay

## 2021-09-28 DIAGNOSIS — M5442 Lumbago with sciatica, left side: Secondary | ICD-10-CM | POA: Diagnosis not present

## 2021-09-28 DIAGNOSIS — M25562 Pain in left knee: Secondary | ICD-10-CM

## 2021-09-28 DIAGNOSIS — M25561 Pain in right knee: Secondary | ICD-10-CM | POA: Diagnosis not present

## 2021-09-28 DIAGNOSIS — M0579 Rheumatoid arthritis with rheumatoid factor of multiple sites without organ or systems involvement: Secondary | ICD-10-CM | POA: Diagnosis not present

## 2021-09-28 DIAGNOSIS — M5432 Sciatica, left side: Secondary | ICD-10-CM | POA: Diagnosis not present

## 2021-09-28 DIAGNOSIS — Z7409 Other reduced mobility: Secondary | ICD-10-CM | POA: Diagnosis not present

## 2021-09-28 DIAGNOSIS — G8929 Other chronic pain: Secondary | ICD-10-CM

## 2021-09-28 NOTE — Therapy (Signed)
[facility] [place], [place], [address] Phone: [phone]   Fax:  [phone]  Physical Therapy Treatment  Patient Details  Name: Jean Howell MRN: [id number] Date of Birth: Oct 07, 1956 Referring Provider (PT): Ronnie Doss DO.   Encounter Date: 09/28/2021   PT End of Session - 09/28/21 0910     Visit Number 3    Number of Visits 12    Date for PT Re-Evaluation 12/20/21    PT Start Time 0905    PT Stop Time 0945    PT Time Calculation (min) 40 min    Activity Tolerance Patient tolerated treatment well;Patient limited by fatigue    Behavior During Therapy [facility] for tasks assessed/performed             Past Medical History:  Diagnosis Date   Arthritis    Depression    Hypertension    Rheumatoid arthritis ([place])     Past Surgical History:  Procedure Laterality Date   BREAST EXCISIONAL BIOPSY Bilateral    pt unsure when- benign   BREAST SURGERY     nodules removed   COLONOSCOPY N/A 06/25/2019   Procedure: COLONOSCOPY;  Surgeon: Danie Binder, MD;  [facility];  Service: Endoscopy;  Laterality: N/A;  1:30   POLYPECTOMY  06/25/2019   Procedure: POLYPECTOMY;  Surgeon: Danie Binder, MD;  [facility];  Service: Endoscopy;;    There were no vitals filed for this visit.   Subjective Assessment - 09/28/21 0908     Subjective COVID-19 screen performed prior to patient entering clinic. Patient reports that she is sore today, but she notes that she is sore every morning.    Pertinent History RA, right TKA, HTN, osteopenia.    How long can you sit comfortably? Unlimited.    How long can you stand comfortably? 5 minutes.    How long can you walk comfortably? 5 minutes.    Patient Stated Goals See above.    Currently in Pain? Yes    Pain Score 6     Pain Location Knee    Pain Orientation Right;Left    Pain Descriptors / Indicators Sore    Pain Onset More than a month ago    Pain  Frequency Constant                               [facility] Adult PT Treatment/Exercise - 09/28/21 0001       Knee/Hip Exercises: Aerobic   Nustep L1 x 10 minutes; seat 10      Knee/Hip Exercises: Standing   Other Standing Knee Exercises Toe taps onto step   6" alternating LE; BUE support; 3 minutes\     Knee/Hip Exercises: Seated   Long Arc Quad Both;AROM;1 set   3 minutes   Other Seated Knee/Hip Exercises Heel/toe raises   3 minutes; BLE   Other Seated Knee/Hip Exercises Slouch/overcorrect   2 minutes                Balance Exercises - 09/28/21 0001       Balance Exercises: Standing   Marching Upper extremity assist 2;Solid surface;Static   2 minutes                    PT Long Term Goals - 09/21/21 1247       PT LONG TERM GOAL #1   Title Patient will be independent  with HEP    Time 6    Period Weeks    Status New      PT LONG TERM GOAL #2   Title Patient will report ability to perform ADLs with low back and bilateral knee pain less than or equal to 4/10    Time 6    Period Weeks    Status New      PT LONG TERM GOAL #3   Title Patient will report ability to walk for 10 minutes or greater with low back pain and bilateral knee pain less than or equal to 4/10.    Time 6    Period Weeks    Status New      PT LONG TERM GOAL #4   Title Stand 15 minutes with pain not > 4/10.    Time 6    Period Weeks    Status New                   Plan - 09/28/21 0925     Clinical Impression Statement Patient was progressed with standing marching and toe taps onto a 6" step for improved gait mechanics and navigating her environment. She required minimal cuing with marching to facilitate upright stance as she demonstrated increased lumbar flexion as she progressed through this intervention and as she increased her pace. Cuing for decreased pace and upright stance were able to temporarily improve her posture. She was able to walk 100 feet  at the conclusion of today's treatment prior to demonstrating increased sway and decreased step height. However, after a brief seated break she was able to demonstrate an immprovement in her gait mechanics. She reported feeling optimistic upon the conclusion of treatment. She continues to require skilled physical therapy to address her remaining impairments to safely return to her prior level of function.    Personal Factors and Comorbidities Comorbidity 1;Comorbidity 2;Other    Comorbidities RA, right TKA, HTN, osteopenia.    Examination-Activity Limitations Other;Transfers;Locomotion Level    Examination-Participation Restrictions Other    Stability/Clinical Decision Making Evolving/Moderate complexity    Rehab Potential Good    PT Frequency 2x / week    PT Duration 6 weeks    PT Treatment/Interventions ADLs/Self Care Home Management;Cryotherapy;Electrical Stimulation;Ultrasound;Moist Heat;Gait training;Stair training;Functional mobility training;Therapeutic activities;Therapeutic exercise;Manual techniques;Patient/family education;Passive range of motion    PT Next Visit Plan STW/M to patient's lumbar musculature, Nustep, low-level core exercises, pain-free bilateral LE ther ex.    Consulted and Agree with Plan of Care Patient             Patient will benefit from skilled therapeutic intervention in order to improve the following deficits and impairments:  Abnormal gait, Decreased activity tolerance, Decreased mobility, Decreased range of motion, Decreased strength, Increased muscle spasms, Postural dysfunction, Pain  Visit Diagnosis: Chronic pain of right knee  Pain of left knee after injury  Chronic pain of left knee  Chronic left-sided low back pain with left-sided sciatica     Problem List Patient Active Problem List   Diagnosis Date Noted   Full code status 06/16/2020   Impaired mobility and endurance 06/16/2020   Sciatica of left side 06/16/2020   Rheumatoid arthritis  involving multiple sites with positive rheumatoid factor ([place]) 03/06/2019   Primary osteoarthritis of both knees 03/06/2019   Former smoker 03/06/2019   ANA positive 02/12/2019   Family history of thyroid disease 01/08/2019   Morbid obesity ([place]) 01/08/2019   Essential hypertension 01/08/2019   Family history of  colon cancer 01/08/2019    Darlin Coco, PT 09/28/2021, 11:51 AM  [facility] [address] [place], [place], [address] Phone: [phone]   Fax:  [phone]  Name: Dolly Harbach MRN: [id number] Date of Birth: 04/12/1956

## 2021-10-01 ENCOUNTER — Other Ambulatory Visit (HOSPITAL_COMMUNITY): Payer: Self-pay

## 2021-10-01 ENCOUNTER — Other Ambulatory Visit: Payer: Self-pay

## 2021-10-01 ENCOUNTER — Ambulatory Visit: Payer: Medicare Other

## 2021-10-01 DIAGNOSIS — G8929 Other chronic pain: Secondary | ICD-10-CM | POA: Diagnosis not present

## 2021-10-01 DIAGNOSIS — M25562 Pain in left knee: Secondary | ICD-10-CM | POA: Diagnosis not present

## 2021-10-01 DIAGNOSIS — Z7409 Other reduced mobility: Secondary | ICD-10-CM | POA: Diagnosis not present

## 2021-10-01 DIAGNOSIS — M25561 Pain in right knee: Secondary | ICD-10-CM | POA: Diagnosis not present

## 2021-10-01 DIAGNOSIS — M5442 Lumbago with sciatica, left side: Secondary | ICD-10-CM | POA: Diagnosis not present

## 2021-10-01 DIAGNOSIS — M0579 Rheumatoid arthritis with rheumatoid factor of multiple sites without organ or systems involvement: Secondary | ICD-10-CM | POA: Diagnosis not present

## 2021-10-01 DIAGNOSIS — M5432 Sciatica, left side: Secondary | ICD-10-CM | POA: Diagnosis not present

## 2021-10-01 NOTE — Therapy (Signed)
[facility] [place], [place], [address] Phone: [phone]   Fax:  [phone]  Physical Therapy Treatment  Patient Details  Name: Jean Howell MRN: [id number] Date of Birth: 18-Apr-1956 Referring Provider (PT): Ronnie Doss DO.   Encounter Date: 10/01/2021   PT End of Session - 10/01/21 0904     Visit Number 4    Number of Visits 12    Date for PT Re-Evaluation 12/20/21    PT Start Time 0901    PT Stop Time 0944    PT Time Calculation (min) 43 min    Activity Tolerance Patient tolerated treatment well;Patient limited by fatigue    Behavior During Therapy [facility] for tasks assessed/performed             Past Medical History:  Diagnosis Date   Arthritis    Depression    Hypertension    Rheumatoid arthritis ([place])     Past Surgical History:  Procedure Laterality Date   BREAST EXCISIONAL BIOPSY Bilateral    pt unsure when- benign   BREAST SURGERY     nodules removed   COLONOSCOPY N/A 06/25/2019   Procedure: COLONOSCOPY;  Surgeon: Danie Binder, MD;  [facility];  Service: Endoscopy;  Laterality: N/A;  1:30   POLYPECTOMY  06/25/2019   Procedure: POLYPECTOMY;  Surgeon: Danie Binder, MD;  [facility];  Service: Endoscopy;;    There were no vitals filed for this visit.   Subjective Assessment - 10/01/21 0902     Subjective COVID-19 screen performed prior to patient entering clinic. Patient reports that she feels about the same this morning, just a little sore.    Pertinent History RA, right TKA, HTN, osteopenia.    How long can you sit comfortably? Unlimited.    How long can you stand comfortably? 5 minutes.    How long can you walk comfortably? 5 minutes.    Patient Stated Goals See above.    Currently in Pain? Yes    Pain Location Knee    Pain Orientation Right;Left    Pain Descriptors / Indicators Sore    Pain Type Chronic pain    Pain Onset More than a month ago                                [facility] Adult PT Treatment/Exercise - 10/01/21 0001       Knee/Hip Exercises: Aerobic   Nustep L4 x 10 minutes; seat 9      Knee/Hip Exercises: Standing   Forward Step Up Both;Step Height: 2";Hand Hold: 2   2 minutes     Knee/Hip Exercises: Seated   Long Arc Quad Both;AROM;1 set   3 minutes   Marching AROM;Both   2 minutes     Manual Therapy   Manual Therapy Soft tissue mobilization    Soft tissue mobilization to quadriceps bilaterally                 Balance Exercises - 10/01/21 0001       Balance Exercises: Standing   Standing Eyes Opened Other reps (comment);1 rep;Solid surface;Wide (BOA)   attempted, but unable to perform due to low back and knee pain   Sidestepping Upper extremity support   BUE support; 2 minutes; firm surface                    PT Long Term Goals -  09/21/21 1247       PT LONG TERM GOAL #1   Title Patient will be independent with HEP    Time 6    Period Weeks    Status New      PT LONG TERM GOAL #2   Title Patient will report ability to perform ADLs with low back and bilateral knee pain less than or equal to 4/10    Time 6    Period Weeks    Status New      PT LONG TERM GOAL #3   Title Patient will report ability to walk for 10 minutes or greater with low back pain and bilateral knee pain less than or equal to 4/10.    Time 6    Period Weeks    Status New      PT LONG TERM GOAL #4   Title Stand 15 minutes with pain not > 4/10.    Time 6    Period Weeks    Status New                   Plan - 10/01/21 0904     Clinical Impression Statement Patient was progressed to step ups onto a two inch step in addition to other familiar interventions. She required moderate cuing throughout treatment for upright stance with all of today's standing interventions. Sitting and standing interventions due to fatigue and low back discomfort. She was attempted to be introduced to static  standing for improved balance. However, she was limited by her bilateral knee pain. Soft tissue mobilization was attempted to the quadriceps bilaterally, but this was only temporarily effective while she was seated. She reported that her knee still hurt while walking at the conclusion of treatment. She continues to require skilled physical therapy to address her remaining impairments to safely maximize her functional mobility.    Personal Factors and Comorbidities Comorbidity 1;Comorbidity 2;Other    Comorbidities RA, right TKA, HTN, osteopenia.    Examination-Activity Limitations Other;Transfers;Locomotion Level    Examination-Participation Restrictions Other    Stability/Clinical Decision Making Evolving/Moderate complexity    Rehab Potential Good    PT Frequency 2x / week    PT Duration 6 weeks    PT Treatment/Interventions ADLs/Self Care Home Management;Cryotherapy;Electrical Stimulation;Ultrasound;Moist Heat;Gait training;Stair training;Functional mobility training;Therapeutic activities;Therapeutic exercise;Manual techniques;Patient/family education;Passive range of motion    PT Next Visit Plan STW/M to patient's lumbar musculature, Nustep, low-level core exercises, pain-free bilateral LE ther ex.; alternating standing and seated interventions due to fatigue    Consulted and Agree with Plan of Care Patient             Patient will benefit from skilled therapeutic intervention in order to improve the following deficits and impairments:  Abnormal gait, Decreased activity tolerance, Decreased mobility, Decreased range of motion, Decreased strength, Increased muscle spasms, Postural dysfunction, Pain  Visit Diagnosis: Chronic pain of right knee  Pain of left knee after injury  Chronic pain of left knee  Chronic left-sided low back pain with left-sided sciatica     Problem List Patient Active Problem List   Diagnosis Date Noted   Full code status 06/16/2020   Impaired mobility  and endurance 06/16/2020   Sciatica of left side 06/16/2020   Rheumatoid arthritis involving multiple sites with positive rheumatoid factor ([place]) 03/06/2019   Primary osteoarthritis of both knees 03/06/2019   Former smoker 03/06/2019   ANA positive 02/12/2019   Family history of thyroid disease 01/08/2019   Morbid obesity ([place])  01/08/2019   Essential hypertension 01/08/2019   Family history of colon cancer 01/08/2019    Darlin Coco, PT 10/01/2021, 11:34 AM  [facility] [place], [place], [address] Phone: [phone]   Fax:  [phone]  Name: Shanyla Marconi MRN: [id number] Date of Birth: 06/28/1956

## 2021-10-05 ENCOUNTER — Other Ambulatory Visit: Payer: Self-pay

## 2021-10-05 ENCOUNTER — Ambulatory Visit: Payer: Medicare Other

## 2021-10-05 ENCOUNTER — Telehealth: Payer: Self-pay

## 2021-10-05 DIAGNOSIS — Z79899 Other long term (current) drug therapy: Secondary | ICD-10-CM

## 2021-10-05 DIAGNOSIS — M25562 Pain in left knee: Secondary | ICD-10-CM | POA: Diagnosis not present

## 2021-10-05 DIAGNOSIS — M0579 Rheumatoid arthritis with rheumatoid factor of multiple sites without organ or systems involvement: Secondary | ICD-10-CM

## 2021-10-05 DIAGNOSIS — Z7409 Other reduced mobility: Secondary | ICD-10-CM | POA: Diagnosis not present

## 2021-10-05 DIAGNOSIS — M25561 Pain in right knee: Secondary | ICD-10-CM | POA: Diagnosis not present

## 2021-10-05 DIAGNOSIS — G8929 Other chronic pain: Secondary | ICD-10-CM | POA: Diagnosis not present

## 2021-10-05 DIAGNOSIS — M5442 Lumbago with sciatica, left side: Secondary | ICD-10-CM | POA: Diagnosis not present

## 2021-10-05 DIAGNOSIS — M5432 Sciatica, left side: Secondary | ICD-10-CM | POA: Diagnosis not present

## 2021-10-05 NOTE — Therapy (Signed)
[facility] [place], [place], [address] Phone: [phone]   Fax:  [phone]  Physical Therapy Treatment  Patient Details  Name: Jean Howell MRN: [id number] Date of Birth: 1956/10/15 Referring Provider (PT): Ronnie Doss DO.   Encounter Date: 10/05/2021   PT End of Session - 10/05/21 0850     Visit Number 5    Number of Visits 12    Date for PT Re-Evaluation 12/20/21    PT Start Time 0902    PT Stop Time 0943    PT Time Calculation (min) 41 min    Activity Tolerance Patient tolerated treatment well;Patient limited by fatigue    Behavior During Therapy [facility] for tasks assessed/performed             Past Medical History:  Diagnosis Date   Arthritis    Depression    Hypertension    Rheumatoid arthritis ([place])     Past Surgical History:  Procedure Laterality Date   BREAST EXCISIONAL BIOPSY Bilateral    pt unsure when- benign   BREAST SURGERY     nodules removed   COLONOSCOPY N/A 06/25/2019   Procedure: COLONOSCOPY;  Surgeon: Danie Binder, MD;  [facility];  Service: Endoscopy;  Laterality: N/A;  1:30   POLYPECTOMY  06/25/2019   Procedure: POLYPECTOMY;  Surgeon: Danie Binder, MD;  [facility];  Service: Endoscopy;;    There were no vitals filed for this visit.   Subjective Assessment - 10/05/21 0849     Subjective COVID-19 screen performed prior to patient entering clinic. Patient reports that her legs are sore today, but no more than normal.    Pertinent History RA, right TKA, HTN, osteopenia.    How long can you sit comfortably? Unlimited.    How long can you stand comfortably? 5 minutes.    How long can you walk comfortably? 5 minutes.    Patient Stated Goals See above.    Currently in Pain? Yes    Pain Score 6     Pain Location Leg    Pain Orientation Right;Left    Pain Descriptors / Indicators Sore    Pain Type Chronic pain    Pain Onset More than a month ago     Pain Frequency Constant                               [facility] Adult PT Treatment/Exercise - 10/05/21 0001       Knee/Hip Exercises: Aerobic   Nustep L3 x 12 minutes      Knee/Hip Exercises: Standing   Hip Flexion Stengthening;Both;20 reps   toe tap on 8" step     Knee/Hip Exercises: Seated   Long Arc Quad Both;20 reps    Other Seated Knee/Hip Exercises Heel/toe raises   3 minutes   Hamstring Curl Strengthening;Both   red t-band; 2 minutes each                Balance Exercises - 10/05/21 0001       Balance Exercises: Standing   Sidestepping Upper extremity support   2 minutes                    PT Long Term Goals - 09/21/21 1247       PT LONG TERM GOAL #1   Title Patient will be independent with HEP    Time 6  Period Weeks    Status New      PT LONG TERM GOAL #2   Title Patient will report ability to perform ADLs with low back and bilateral knee pain less than or equal to 4/10    Time 6    Period Weeks    Status New      PT LONG TERM GOAL #3   Title Patient will report ability to walk for 10 minutes or greater with low back pain and bilateral knee pain less than or equal to 4/10.    Time 6    Period Weeks    Status New      PT LONG TERM GOAL #4   Title Stand 15 minutes with pain not > 4/10.    Time 6    Period Weeks    Status New                   Plan - 10/05/21 0904     Clinical Impression Statement Patient was progressed with toe taps onto a 8" step with moderate difficulty and fatigue. She required minimal cuing to promote improved foot clearance. However, as she became fatigued she exhibited increased hip circumduction bilaterally when performing hip flexion. She experienced a mild increase in right ankle pain with this activity, but this was able to be moderately reduced with seated heel/toe raises.  She reported feeling tired upon the conclusion of treatment. She continues to require skilled physical  therapy to address her remaining impairments to safely return to her prior level of function.    Personal Factors and Comorbidities Comorbidity 1;Comorbidity 2;Other    Comorbidities RA, right TKA, HTN, osteopenia.    Examination-Activity Limitations Other;Transfers;Locomotion Level    Examination-Participation Restrictions Other    Stability/Clinical Decision Making Evolving/Moderate complexity    Rehab Potential Good    PT Frequency 2x / week    PT Duration 6 weeks    PT Treatment/Interventions ADLs/Self Care Home Management;Cryotherapy;Electrical Stimulation;Ultrasound;Moist Heat;Gait training;Stair training;Functional mobility training;Therapeutic activities;Therapeutic exercise;Manual techniques;Patient/family education;Passive range of motion    PT Next Visit Plan STW/M to patient's lumbar musculature, Nustep, low-level core exercises, pain-free bilateral LE ther ex.; alternating standing and seated interventions due to fatigue    Consulted and Agree with Plan of Care Patient             Patient will benefit from skilled therapeutic intervention in order to improve the following deficits and impairments:  Abnormal gait, Decreased activity tolerance, Decreased mobility, Decreased range of motion, Decreased strength, Increased muscle spasms, Postural dysfunction, Pain  Visit Diagnosis: Chronic pain of right knee  Pain of left knee after injury  Chronic pain of left knee  Chronic left-sided low back pain with left-sided sciatica     Problem List Patient Active Problem List   Diagnosis Date Noted   Full code status 06/16/2020   Impaired mobility and endurance 06/16/2020   Sciatica of left side 06/16/2020   Rheumatoid arthritis involving multiple sites with positive rheumatoid factor ([place]) 03/06/2019   Primary osteoarthritis of both knees 03/06/2019   Former smoker 03/06/2019   ANA positive 02/12/2019   Family history of thyroid disease 01/08/2019   Morbid obesity ([place])  01/08/2019   Essential hypertension 01/08/2019   Family history of colon cancer 01/08/2019    Darlin Coco, PT 10/05/2021, 12:23 PM  [facility] [address] [place], [place], [address] Phone: [phone]   Fax:  [phone]  Name: Jean Howell MRN: [id number] Date  of Birth: Sep 29, 1956

## 2021-10-05 NOTE — Telephone Encounter (Signed)
Lab Orders faxed

## 2021-10-05 NOTE — Telephone Encounter (Signed)
Patient left a voicemail requesting labwork orders be sent to [place].

## 2021-10-06 ENCOUNTER — Other Ambulatory Visit: Payer: Medicare Other

## 2021-10-06 DIAGNOSIS — Z79899 Other long term (current) drug therapy: Secondary | ICD-10-CM | POA: Diagnosis not present

## 2021-10-06 DIAGNOSIS — M0579 Rheumatoid arthritis with rheumatoid factor of multiple sites without organ or systems involvement: Secondary | ICD-10-CM | POA: Diagnosis not present

## 2021-10-07 ENCOUNTER — Other Ambulatory Visit: Payer: Self-pay | Admitting: Rheumatology

## 2021-10-07 DIAGNOSIS — D225 Melanocytic nevi of trunk: Secondary | ICD-10-CM | POA: Diagnosis not present

## 2021-10-07 DIAGNOSIS — D2371 Other benign neoplasm of skin of right lower limb, including hip: Secondary | ICD-10-CM | POA: Diagnosis not present

## 2021-10-07 DIAGNOSIS — D2271 Melanocytic nevi of right lower limb, including hip: Secondary | ICD-10-CM | POA: Diagnosis not present

## 2021-10-07 DIAGNOSIS — D485 Neoplasm of uncertain behavior of skin: Secondary | ICD-10-CM | POA: Diagnosis not present

## 2021-10-07 DIAGNOSIS — Z1283 Encounter for screening for malignant neoplasm of skin: Secondary | ICD-10-CM | POA: Diagnosis not present

## 2021-10-07 LAB — CBC WITH DIFFERENTIAL/PLATELET
Basophils Absolute: 0 10*3/uL (ref 0.0–0.2)
Basos: 1 %
EOS (ABSOLUTE): 0.3 10*3/uL (ref 0.0–0.4)
Eos: 5 %
Hematocrit: 37.7 % (ref 34.0–46.6)
Hemoglobin: 12.3 g/dL (ref 11.1–15.9)
Immature Grans (Abs): 0 10*3/uL (ref 0.0–0.1)
Immature Granulocytes: 0 %
Lymphocytes Absolute: 2.2 10*3/uL (ref 0.7–3.1)
Lymphs: 36 %
MCH: 27.8 pg (ref 26.6–33.0)
MCHC: 32.6 g/dL (ref 31.5–35.7)
MCV: 85 fL (ref 79–97)
Monocytes Absolute: 0.4 10*3/uL (ref 0.1–0.9)
Monocytes: 7 %
Neutrophils Absolute: 3.2 10*3/uL (ref 1.4–7.0)
Neutrophils: 51 %
Platelets: 389 10*3/uL (ref 150–450)
RBC: 4.43 x10E6/uL (ref 3.77–5.28)
RDW: 14.8 % (ref 11.7–15.4)
WBC: 6.2 10*3/uL (ref 3.4–10.8)

## 2021-10-07 LAB — CMP14+EGFR
ALT: 18 IU/L (ref 0–32)
AST: 17 IU/L (ref 0–40)
Albumin/Globulin Ratio: 0.9 — ABNORMAL LOW (ref 1.2–2.2)
Albumin: 3.4 g/dL — ABNORMAL LOW (ref 3.8–4.8)
Alkaline Phosphatase: 99 IU/L (ref 44–121)
BUN/Creatinine Ratio: 14 (ref 12–28)
BUN: 15 mg/dL (ref 8–27)
Bilirubin Total: 0.3 mg/dL (ref 0.0–1.2)
CO2: 25 mmol/L (ref 20–29)
Calcium: 9.7 mg/dL (ref 8.7–10.3)
Chloride: 100 mmol/L (ref 96–106)
Creatinine, Ser: 1.07 mg/dL — ABNORMAL HIGH (ref 0.57–1.00)
Globulin, Total: 3.9 g/dL (ref 1.5–4.5)
Glucose: 93 mg/dL (ref 70–99)
Potassium: 4.4 mmol/L (ref 3.5–5.2)
Sodium: 138 mmol/L (ref 134–144)
Total Protein: 7.3 g/dL (ref 6.0–8.5)
eGFR: 57 mL/min/{1.73_m2} — ABNORMAL LOW (ref >59)

## 2021-10-07 NOTE — Telephone Encounter (Signed)
Next Visit: 10/22/2021  Last Visit: 05/21/2021  Last Fill: 07/07/2021  DX: Rheumatoid arthritis involving multiple sites with positive rheumatoid factor   Current Dose per office note 05/21/2021, Arava 20 mg 1 tablet by mouth daily  Labs: 10/06/2021, CBC is normal, creatinine is elevated and stable.   Okay to refill Arava?

## 2021-10-07 NOTE — Progress Notes (Signed)
CBC is normal, creatinine is elevated and stable.  Please forward labs to her PCP.

## 2021-10-08 ENCOUNTER — Ambulatory Visit: Payer: Medicare Other | Attending: Family Medicine

## 2021-10-08 ENCOUNTER — Other Ambulatory Visit: Payer: Self-pay

## 2021-10-08 DIAGNOSIS — M5442 Lumbago with sciatica, left side: Secondary | ICD-10-CM | POA: Insufficient documentation

## 2021-10-08 DIAGNOSIS — R262 Difficulty in walking, not elsewhere classified: Secondary | ICD-10-CM | POA: Insufficient documentation

## 2021-10-08 DIAGNOSIS — M25561 Pain in right knee: Secondary | ICD-10-CM | POA: Insufficient documentation

## 2021-10-08 DIAGNOSIS — M25562 Pain in left knee: Secondary | ICD-10-CM | POA: Insufficient documentation

## 2021-10-08 DIAGNOSIS — G8929 Other chronic pain: Secondary | ICD-10-CM | POA: Insufficient documentation

## 2021-10-08 NOTE — Therapy (Signed)
[facility] [place], [place], [address] Phone: [phone]   Fax:  [phone]  Physical Therapy Treatment  Patient Details  Name: Jean Howell MRN: [id number] Date of Birth: 07-Aug-1956 Referring Provider (PT): Ronnie Doss DO.   Encounter Date: 10/08/2021   PT End of Session - 10/08/21 0955     Visit Number 6    Number of Visits 12    Date for PT Re-Evaluation 12/20/21    PT Start Time 0945    PT Stop Time 1030    PT Time Calculation (min) 45 min    Activity Tolerance Patient tolerated treatment well;Patient limited by fatigue    Behavior During Therapy WFL for tasks assessed/performed             Past Medical History:  Diagnosis Date   Arthritis    Depression    Hypertension    Rheumatoid arthritis ([place])     Past Surgical History:  Procedure Laterality Date   BREAST EXCISIONAL BIOPSY Bilateral    pt unsure when- benign   BREAST SURGERY     nodules removed   COLONOSCOPY N/A 06/25/2019   Procedure: COLONOSCOPY;  Surgeon: Danie Binder, MD;  [facility];  Service: Endoscopy;  Laterality: N/A;  1:30   POLYPECTOMY  06/25/2019   Procedure: POLYPECTOMY;  Surgeon: Danie Binder, MD;  [facility];  Service: Endoscopy;;    There were no vitals filed for this visit.   Subjective Assessment - 10/08/21 0954     Subjective COVID-19 screen performed prior to patient entering clinic. Patient reports 5/10 in right knee and 4/10 in left knee upon arriving for today's treatment session.    Pertinent History RA, right TKA, HTN, osteopenia.    How long can you sit comfortably? Unlimited.    How long can you stand comfortably? 5 minutes.    How long can you walk comfortably? 5 minutes.    Patient Stated Goals See above.    Currently in Pain? Yes    Pain Score 5     Pain Location Knee    Pain Orientation Right;Left    Pain Descriptors / Indicators Sore    Pain Onset More than a month  ago                               [facility] Adult PT Treatment/Exercise - 10/08/21 0001       Knee/Hip Exercises: Aerobic   Nustep Lvl 3 x 15 mins, seat 9      Knee/Hip Exercises: Standing   Forward Step Up Both;20 reps;Hand Hold: 2;Step Height: 4"   Cues to not pull with UE     Knee/Hip Exercises: Seated   Long Arc Quad Both;20 reps   cues for pacing and posture   Long Arc Quad Weight 1 lbs.    Ball Squeeze 20 reps with 3 sec hold   cues for hold   Clamshell with TheraBand Red   20 reps with 3 sec hold   Marching Strengthening;Both;2 sets;10 reps    Marching Limitations 1    Hamstring Curl Strengthening;Both;2 sets;10 reps      Manual Therapy   Manual Therapy Soft tissue mobilization;Joint mobilization    Joint Mobilization Patellar mobs to bilateral knees    Soft tissue mobilization STW/M to bilateral quads  PT Long Term Goals - 09/21/21 1247       PT LONG TERM GOAL #1   Title Patient will be independent with HEP    Time 6    Period Weeks    Status New      PT LONG TERM GOAL #2   Title Patient will report ability to perform ADLs with low back and bilateral knee pain less than or equal to 4/10    Time 6    Period Weeks    Status New      PT LONG TERM GOAL #3   Title Patient will report ability to walk for 10 minutes or greater with low back pain and bilateral knee pain less than or equal to 4/10.    Time 6    Period Weeks    Status New      PT LONG TERM GOAL #4   Title Stand 15 minutes with pain not > 4/10.    Time 6    Period Weeks    Status New                   Plan - 10/08/21 0956     Clinical Impression Statement Pt reportes 5/10 right knee pain and 4/10 left knee pain upon arriving for today's treatment session.  Pt able to tolerate forward step ups to 6 inch step without increase in pain.  Pt requiring several seated rest breaks throughout treatment session due to fatigue.  STW  performed to bilateral quads with patellar mobs also performed as well at completion of today's session.  Pt reported 3/10 bilateral knee pain at end of session.    Personal Factors and Comorbidities Comorbidity 1;Comorbidity 2;Other    Comorbidities RA, right TKA, HTN, osteopenia.    Examination-Activity Limitations Other;Transfers;Locomotion Level    Examination-Participation Restrictions Other    Stability/Clinical Decision Making Evolving/Moderate complexity    Rehab Potential Good    PT Frequency 2x / week    PT Duration 6 weeks    PT Treatment/Interventions ADLs/Self Care Home Management;Cryotherapy;Electrical Stimulation;Ultrasound;Moist Heat;Gait training;Stair training;Functional mobility training;Therapeutic activities;Therapeutic exercise;Manual techniques;Patient/family education;Passive range of motion    PT Next Visit Plan STW/M to patient's lumbar musculature, Nustep, low-level core exercises, pain-free bilateral LE ther ex.; alternating standing and seated interventions due to fatigue    Consulted and Agree with Plan of Care Patient             Patient will benefit from skilled therapeutic intervention in order to improve the following deficits and impairments:  Abnormal gait, Decreased activity tolerance, Decreased mobility, Decreased range of motion, Decreased strength, Increased muscle spasms, Postural dysfunction, Pain  Visit Diagnosis: Chronic pain of right knee  Chronic pain of left knee  Pain of left knee after injury  Difficulty in walking, not elsewhere classified     Problem List Patient Active Problem List   Diagnosis Date Noted   Full code status 06/16/2020   Impaired mobility and endurance 06/16/2020   Sciatica of left side 06/16/2020   Rheumatoid arthritis involving multiple sites with positive rheumatoid factor ([place]) 03/06/2019   Primary osteoarthritis of both knees 03/06/2019   Former smoker 03/06/2019   ANA positive 02/12/2019   Family  history of thyroid disease 01/08/2019   Morbid obesity ([place]) 01/08/2019   Essential hypertension 01/08/2019   Family history of colon cancer 01/08/2019    Kathrynn Ducking, PTA 10/08/2021, 10:37 AM  [facility] [address] [place], [place], [address]  Phone: [phone]   Fax:  [phone]  Name: Jean Howell MRN: [id number] Date of Birth: 1956-11-21

## 2021-10-08 NOTE — Progress Notes (Signed)
Office Visit Note  Patient: Jean Howell             Date of Birth: 09-16-1956           MRN: [id number]             PCP: [name], DO Referring: [name], DO Visit Date: 10/22/2021 Occupation: @GUAROCC @  Subjective:  Pain in multiple joints   History of Present Illness: Jean Howell is a 65 y.o. female with history of seropositive arthritis and osteoarthritis.  She is on humira 40 mg sq injections every 14 days and arava 20 mg daily.  Her last dose of Humira was on 10/19/2021.  She has not missed any doses of Humira or Arava recently.  She continues to tolerate both medications without any side effects or injection site reactions.  Patient reports that she continues to have frequent and severe flares.  She is currently having pain and swelling in both hands and both wrist joints.  She also has chronic pain and inflammation in both knee joints and both ankles.  She has been wearing right wrist brace for support and compression which provides some relief.  Overall she has not found Humira and Arava to be effective at managing her rheumatoid arthritis.  She would like to discuss other treatment options today. She denies any recent infections.  She denies any other new medical conditions.      Activities of Daily Living:  Patient reports joint stiffness all day Patient Denies nocturnal pain.  Difficulty dressing/grooming: Denies Difficulty climbing stairs: Reports Difficulty getting out of chair: Reports Difficulty using hands for taps, buttons, cutlery, and/or writing: Reports  Review of Systems  Constitutional:  Positive for fatigue.  HENT:  Positive for mouth dryness.   Eyes:  Negative for dryness.  Respiratory:  Negative for shortness of breath.   Cardiovascular:  Negative for swelling in legs/feet.  Gastrointestinal:  Negative for constipation.  Endocrine: Negative for increased urination.  Genitourinary:  Negative for difficulty urinating.   Musculoskeletal:  Positive for joint pain, gait problem, joint pain, joint swelling, muscle weakness, morning stiffness and muscle tenderness.  Skin:  Negative for rash.  Allergic/Immunologic: Negative for susceptible to infections.  Neurological:  Positive for numbness and weakness.  Hematological:  Negative for bruising/bleeding tendency.  Psychiatric/Behavioral:  Negative for sleep disturbance.    PMFS History:  Patient Active Problem List   Diagnosis Date Noted   Full code status 06/16/2020   Impaired mobility and endurance 06/16/2020   Sciatica of left side 06/16/2020   Rheumatoid arthritis involving multiple sites with positive rheumatoid factor ([place]) 03/06/2019   Primary osteoarthritis of both knees 03/06/2019   Former smoker 03/06/2019   ANA positive 02/12/2019   Family history of thyroid disease 01/08/2019   Morbid obesity ([place]) 01/08/2019   Essential hypertension 01/08/2019   Family history of colon cancer 01/08/2019    Past Medical History:  Diagnosis Date   Arthritis    Depression    Hypertension    Rheumatoid arthritis ([place])     Family History  Problem Relation Age of Onset   Cancer Father    Colon cancer Father    Thyroid disease Mother    CVA Mother 49   Thyroid disease Sister    Healthy Son    Congestive Heart Failure Sister    Hypertension Sister    Diabetes Sister    Hypertension Sister    Diabetes Sister    Hypertension Sister    Hypertension  Sister    Cancer Brother        kidney    Past Surgical History:  Procedure Laterality Date   BREAST EXCISIONAL BIOPSY Bilateral    pt unsure when- benign   BREAST SURGERY     nodules removed   COLONOSCOPY N/A 06/25/2019   Procedure: COLONOSCOPY;  Surgeon: Danie Binder, MD;  [facility];  Service: Endoscopy;  Laterality: N/A;  1:30   POLYPECTOMY  06/25/2019   Procedure: POLYPECTOMY;  Surgeon: Danie Binder, MD;  [facility];  Service: Endoscopy;;   Social History    Social History Narrative   Patient is a widow that resides independently in [place].  She has 1 son, who resides in [place].   Immunization History  Administered Date(s) Administered   Fluad Quad(high Dose 65+) 09/16/2020   Influenza, Quadrivalent, Recombinant, Inj, Pf 09/19/2019   PFIZER(Purple Top)SARS-COV-2 Vaccination 05/23/2020, 06/13/2020, 07/31/2020, 02/28/2021     Objective: Vital Signs: BP 117/78 (BP Location: Left Arm, Patient Position: Sitting, Cuff Size: Small)   Pulse 97   Resp 12   Ht 5\' 3"  (1.6 m)   Wt 216 lb (98 kg)   BMI 38.26 kg/m    Physical Exam Vitals and nursing note reviewed.  Constitutional:      Appearance: She is well-developed.  HENT:     Head: Normocephalic and atraumatic.  Eyes:     Conjunctiva/sclera: Conjunctivae normal.  Pulmonary:     Effort: Pulmonary effort is normal.  Abdominal:     Palpations: Abdomen is soft.  Musculoskeletal:     Cervical back: Normal range of motion.  Skin:    General: Skin is warm and dry.     Capillary Refill: Capillary refill takes less than 2 seconds.  Neurological:     Mental Status: She is alert and oriented to person, place, and time.  Psychiatric:        Behavior: Behavior normal.     Musculoskeletal Exam: C-spine has slightly limited range of motion without rotation.  Painful range of motion of both shoulder joints with stiffness bilaterally.  Elbow joints have good range of motion with no tenderness or inflammation.  Extensor tenosynovitis and tenderness of the right wrist.  Tenderness and synovitis of the left wrist noted.  Tenderness and synovitis of the first, second, third, and fifth MCP joints.  Tenderness and synovitis of the right third PIP and left second through fourth PIP joints.  Hip joints have good range of motion with no discomfort on exam.  Warmth and swelling of the left knee noted.  Painful range of motion of both knee joints.  Ankle joints have good range of motion with  tenderness bilaterally.  Some swelling and warmth of the right ankle noted.   CDAI Exam: CDAI Score: 26  Patient Global: 5 mm; Provider Global: 5 mm Swollen: 12 ; Tender: 16  Joint Exam 10/22/2021      Right  Left  Glenohumeral   Tender   Tender  Wrist  Swollen Tender  Swollen Tender  MCP 1  Swollen Tender     MCP 2  Swollen Tender     MCP 3  Swollen Tender     MCP 5  Swollen Tender     PIP 2     Swollen Tender  PIP 3  Swollen Tender  Swollen Tender  PIP 4     Swollen Tender  Knee   Tender  Swollen Tender  Ankle  Swollen Tender  Tender     Investigation: No additional findings.  Imaging: No results found.  Recent Labs: Lab Results  Component Value Date   WBC 6.2 10/06/2021   HGB 12.3 10/06/2021   PLT 389 10/06/2021   NA 138 10/06/2021   K 4.4 10/06/2021   CL 100 10/06/2021   CO2 25 10/06/2021   GLUCOSE 93 10/06/2021   BUN 15 10/06/2021   CREATININE 1.07 (H) 10/06/2021   BILITOT 0.3 10/06/2021   ALKPHOS 99 10/06/2021   AST 17 10/06/2021   ALT 18 10/06/2021   PROT 7.3 10/06/2021   ALBUMIN 3.4 (L) 10/06/2021   CALCIUM 9.7 10/06/2021   GFRAA 88 01/07/2021   QFTBGOLDPLUS NEGATIVE 01/07/2021    Speciality Comments: PLQ Eye Exam: 12/16/2018 WNL @ Dr Anthony Sar Follow up in 1 year  Procedures:  No procedures performed Allergies: Patient has no known allergies.   Assessment / Plan:     Visit Diagnoses: Rheumatoid arthritis involving multiple sites with positive rheumatoid factor (HCC) - Positive RF, +14-3-3 eta, Positive ANA, erosive inflammatory: She presents today with pain and inflammation involving multiple joints.  She has been having more frequent and severe flares involving both hands and both wrist joints, both knees, and both ankles.  She has extensor tenosynovitis of the right wrist as well as synovitis in multiple MCP and PIP joints.  She has been wearing wrist brace on a daily basis for compression and support.  She has not been taking any over-the-counter  products for pain relief.  She is currently on Humira 40 mg subcutaneous injections every 14 days and Arava 20 mg 1 tablet daily.  Her last dose of Humira was on 10/19/2021.  Previous therapies include Plaquenil and methotrexate.  Methotrexate was discontinued due to elevated creatinine.  Different treatment options were discussed today since her rheumatoid arthritis is not well controlled on the current treatment regimen.  Indications, contraindications, potential side effects of Enbrel were discussed.  All questions were addressed and consent was obtained.  We will apply for Enbrel through her insurance and once approved she will return to the office for administration of the first injection.  She will remain on [place] as prescribed.  She will be eligible to start on Enbrel on or after 11/02/2021.  She will follow-up in the office in 8 weeks to assess her response to Enbrel and may be a combination therapy.   Medication counseling:   TB Test: 01/07/21. Future order placed today.  Hepatitis panel: negative 02/26/19 HIV: negative 02/26/19 SPEP: IFE did not reveal any monoclonal proteins on 02/26/19 Immunoglobulins: 02/26/19 Chest x-ray: 06/11/19: No active cardiopulmonary disease. Does patient have diagnosis of heart failure?  No  Counseled patient that Enbrel is a TNF blocking agent.  Reviewed Enbrel dose of 50 mg once weekly.  Counseled patient on purpose, proper use, and adverse effects of Enbrel.  Reviewed the most common adverse effects including infections, headache, and injection site reactions. Discussed that there is the possibility of an increased risk of malignancy but it is not well understood if this increased risk is due to the medication or the disease state.  Advised patient to get yearly dermatology exams due to risk of skin cancer.  Reviewed the importance of regular labs while on Enbrel therapy.  Advised patient to get standing labs one month after starting Enbrel then every 2 months.  Provided  patient with standing lab orders.  Counseled patient that Enbrel should be held prior to scheduled surgery.  Counseled patient to  avoid live vaccines while on Enbrel.  Advised patient to get annual influenza vaccine and the pneumococcal vaccine as needed.  Provided patient with medication education material and answered all questions.  Patient voiced understanding.  Patient consented to Enbrel.  Will upload consent into the media tab.  Reviewed storage instructions for Enbrel.  Advised initial injection must be administered in office.  Patient voiced understanding.    High risk medication use -Applying for Enbrel 50 mg subcutaneous injections once weekly.  She will remain on Arava 20 mg 1 tablet by mouth daily.  Advised to discontinue Humira.  She will be eligible to start on Enbrel on or after 11/02/2021.  She is aware that she will return to the office for administration of the first injection for further instruction and monitoring. CBC and CMP updated on 10/06/21.  She will be due to update lab work in February and every 3 months. Standing orders for CBC and CMP are in place. TB gold negative on 01/07/21. Future order placed today. - Plan: QuantiFERON-TB Gold Plus Previous therapies include: Plaquenil, and methotrexate.  Methotrexate discontinued due to elevated creatinine and low GFR. Patient was initiated on Humira on 09/10/2019.  Inadequate response to Humira.  Advised to discontinue Humira (last dose was 10/19/2021).  Refill that was sent earlier today has been canceled.  We will apply for Enbrel 50 mg subcutaneous injections once weekly through her insurance as discussed above.  She will remain on [place] as prescribed.  She will return for lab work in 1 month then every 3 months while on combination therapy.  She has had all baseline immunosuppressive lab work required. She has not had any recent infections.  Discussed the importance of holding Enbrel and Arava if she develops signs or symptoms of an  infection and to resume once the infection is completely cleared.  She voiced understanding. Discussed the importance of yearly skin examinations while on Enbrel due to the increased risk for nonmelanoma skin cancer.  She follows up with a dermatologist on a regular basis.   Primary osteoarthritis and rheumatoid arthritis of both knees: Chronic pain.  She had painful range of motion of both knee joints especially of the left knee.  Warmth and swelling of the left knee joint noted on examination.  She has difficulty climbing steps as well as rising from a seated position due to the severity of pain and stiffness.  ANA positive: She has no clinical features of systemic lupus.  Other fatigue: Chronic but stable.  Other medical conditions are listed as follows:  Essential hypertension: Blood pressure is 117/78 today in the office.  History of depression  Former smoker  Family history of thyroid disease  Family history of colon cancer  BMI 38.0-38.9,adult  Orders: Orders Placed This Encounter  Procedures   QuantiFERON-TB Gold Plus   No orders of the defined types were placed in this encounter.    Follow-Up Instructions: Return for Rheumatoid arthritis, Osteoarthritis.   Ofilia Neas, PA-C  Note - This record has been created using Dragon software.  Chart creation errors have been sought, but may not always  have been located. Such creation errors do not reflect on  the standard of medical care.

## 2021-10-12 ENCOUNTER — Ambulatory Visit: Payer: Medicare Other

## 2021-10-12 ENCOUNTER — Other Ambulatory Visit: Payer: Self-pay

## 2021-10-12 DIAGNOSIS — G8929 Other chronic pain: Secondary | ICD-10-CM

## 2021-10-12 DIAGNOSIS — M25561 Pain in right knee: Secondary | ICD-10-CM | POA: Diagnosis not present

## 2021-10-12 DIAGNOSIS — M5442 Lumbago with sciatica, left side: Secondary | ICD-10-CM | POA: Diagnosis not present

## 2021-10-12 DIAGNOSIS — R262 Difficulty in walking, not elsewhere classified: Secondary | ICD-10-CM | POA: Diagnosis not present

## 2021-10-12 DIAGNOSIS — M25562 Pain in left knee: Secondary | ICD-10-CM

## 2021-10-12 NOTE — Therapy (Signed)
Jean Howell, [place], [address] Phone: [phone]   Fax:  [phone]  Physical Therapy Treatment  Patient Details  Name: Jean Howell MRN: [id number] Date of Birth: 03/31/1956 Referring Provider (PT): Ronnie Doss DO.   Encounter Date: 10/12/2021   PT End of Session - 10/12/21 0904     Visit Number 7    Number of Visits 12    Date for PT Re-Evaluation 12/20/21    PT Start Time 0901    PT Stop Time 0944    PT Time Calculation (min) 43 min    Activity Tolerance Patient tolerated treatment well;Patient limited by fatigue    Behavior During Therapy [facility] for tasks assessed/performed             Past Medical History:  Diagnosis Date   Arthritis    Depression    Hypertension    Rheumatoid arthritis ([place])     Past Surgical History:  Procedure Laterality Date   BREAST EXCISIONAL BIOPSY Bilateral    pt unsure when- benign   BREAST SURGERY     nodules removed   COLONOSCOPY N/A 06/25/2019   Procedure: COLONOSCOPY;  Surgeon: Jean Binder, MD;  [facility];  Service: Endoscopy;  Laterality: N/A;  1:30   POLYPECTOMY  06/25/2019   Procedure: POLYPECTOMY;  Surgeon: Jean Binder, MD;  [facility];  Service: Endoscopy;;    There were no vitals filed for this visit.   Subjective Assessment - 10/12/21 0903     Subjective COVID-19 screen performed prior to patient entering clinic. Patient reports that she is not hurting today. However, her legs feel "really tight."    Pertinent History RA, right TKA, HTN, osteopenia.    How long can you sit comfortably? Unlimited.    How long can you stand comfortably? 5 minutes.    How long can you walk comfortably? 5 minutes.    Patient Stated Goals See above.    Currently in Pain? Yes    Pain Score 6     Pain Location Leg    Pain Orientation Right;Left    Pain Descriptors / Indicators Tightness    Pain Type Chronic pain    Pain Onset  More than a month ago                               [facility] Adult PT Treatment/Exercise - 10/12/21 0001       Knee/Hip Exercises: Aerobic   Nustep L6 x 15 minutes; seat 9      Knee/Hip Exercises: Seated   Long Arc Quad Strengthening;Both;2 sets;10 reps    Long Arc Quad Weight 2 lbs.    Other Seated Knee/Hip Exercises Heel/toe raises   40 each     Knee/Hip Exercises: Supine   Hip Adduction Isometric Both;3 sets;10 reps   5 second hold                Balance Exercises - 10/12/21 0001       Balance Exercises: Standing   Step Ups Forward;UE support 2   onto foam pad; 2x1 minute   Sidestepping Upper extremity support   2.5 minutes   Marching Foam/compliant surface;Upper extremity assist 2;Static   2x1.5 minutes                    PT Long Term Goals - 09/21/21 1247  PT LONG TERM GOAL #1   Title Patient will be independent with HEP    Time 6    Period Weeks    Status New      PT LONG TERM GOAL #2   Title Patient will report ability to perform ADLs with low back and bilateral knee pain less than or equal to 4/10    Time 6    Period Weeks    Status New      PT LONG TERM GOAL #3   Title Patient will report ability to walk for 10 minutes or greater with low back pain and bilateral knee pain less than or equal to 4/10.    Time 6    Period Weeks    Status New      PT LONG TERM GOAL #4   Title Stand 15 minutes with pain not > 4/10.    Time 6    Period Weeks    Status New                   Plan - 10/12/21 0905     Clinical Impression Statement Patient was progressed with multiple familiar interventions for improved lower extremity strength with moderate difficulty and fatigue. She required minimal cuing with all of today's activities for proper pacing to avoid a significant increase in fatigue. Fatigue was her primary limiting factor with today's interventions as she required multiple seated rest breaks throughout  treatment.  She reported feeling tired upon the conclusion of treatment. She continues to require skilled physical therapy to safely maximize her functional mobility.    Personal Factors and Comorbidities Comorbidity 1;Comorbidity 2;Other    Comorbidities RA, right TKA, HTN, osteopenia.    Examination-Activity Limitations Other;Transfers;Locomotion Level    Examination-Participation Restrictions Other    Stability/Clinical Decision Making Evolving/Moderate complexity    Rehab Potential Good    PT Frequency 2x / week    PT Duration 6 weeks    PT Treatment/Interventions ADLs/Self Care Home Management;Cryotherapy;Electrical Stimulation;Ultrasound;Moist Heat;Gait training;Stair training;Functional mobility training;Therapeutic activities;Therapeutic exercise;Manual techniques;Patient/family education;Passive range of motion    PT Next Visit Plan STW/M to patient's lumbar musculature, Nustep, low-level core exercises, pain-free bilateral LE ther ex.; alternating standing and seated interventions due to fatigue    Consulted and Agree with Plan of Care Patient             Patient will benefit from skilled therapeutic intervention in order to improve the following deficits and impairments:  Abnormal gait, Decreased activity tolerance, Decreased mobility, Decreased range of motion, Decreased strength, Increased muscle spasms, Postural dysfunction, Pain  Visit Diagnosis: Chronic pain of right knee  Chronic pain of left knee  Pain of left knee after injury  Chronic left-sided low back pain with left-sided sciatica     Problem List Patient Active Problem List   Diagnosis Date Noted   Full code status 06/16/2020   Impaired mobility and endurance 06/16/2020   Sciatica of left side 06/16/2020   Rheumatoid arthritis involving multiple sites with positive rheumatoid factor ([place]) 03/06/2019   Primary osteoarthritis of both knees 03/06/2019   Former smoker 03/06/2019   ANA positive 02/12/2019    Family history of thyroid disease 01/08/2019   Morbid obesity ([name]) 01/08/2019   Essential hypertension 01/08/2019   Family history of colon cancer 01/08/2019    Darlin Coco, PT 10/12/2021, 12:18 PM  [facility] [address] [place], [place], [address] Phone: [phone]   Fax:  [phone]  Name:  Jean Howell MRN: [id number] Date of Birth: 04-13-1956

## 2021-10-15 ENCOUNTER — Ambulatory Visit: Payer: Medicare Other

## 2021-10-15 ENCOUNTER — Other Ambulatory Visit: Payer: Self-pay

## 2021-10-15 DIAGNOSIS — M25562 Pain in left knee: Secondary | ICD-10-CM | POA: Diagnosis not present

## 2021-10-15 DIAGNOSIS — M5442 Lumbago with sciatica, left side: Secondary | ICD-10-CM | POA: Diagnosis not present

## 2021-10-15 DIAGNOSIS — R262 Difficulty in walking, not elsewhere classified: Secondary | ICD-10-CM | POA: Diagnosis not present

## 2021-10-15 DIAGNOSIS — G8929 Other chronic pain: Secondary | ICD-10-CM | POA: Diagnosis not present

## 2021-10-15 DIAGNOSIS — M25561 Pain in right knee: Secondary | ICD-10-CM | POA: Diagnosis not present

## 2021-10-15 NOTE — Therapy (Signed)
[facility] [place], [place], [address] Phone: [phone]   Fax:  [phone]  Physical Therapy Treatment  Patient Details  Name: Jean Howell MRN: [id number] Date of Birth: March 06, 1956 Referring Provider (PT): Ronnie Doss DO.   Encounter Date: 10/15/2021   PT End of Session - 10/15/21 0915     Visit Number 8    Number of Visits 12    Date for PT Re-Evaluation 12/20/21    PT Start Time 0900    PT Stop Time 0946    PT Time Calculation (min) 46 min    Activity Tolerance Patient tolerated treatment well    Behavior During Therapy WFL for tasks assessed/performed             Past Medical History:  Diagnosis Date   Arthritis    Depression    Hypertension    Rheumatoid arthritis ([place])     Past Surgical History:  Procedure Laterality Date   BREAST EXCISIONAL BIOPSY Bilateral    pt unsure when- benign   BREAST SURGERY     nodules removed   COLONOSCOPY N/A 06/25/2019   Procedure: COLONOSCOPY;  Surgeon: Danie Binder, MD;  [facility];  Service: Endoscopy;  Laterality: N/A;  1:30   POLYPECTOMY  06/25/2019   Procedure: POLYPECTOMY;  Surgeon: Danie Binder, MD;  [facility];  Service: Endoscopy;;    There were no vitals filed for this visit.   Subjective Assessment - 10/15/21 0904     Subjective COVID-19 screen performed prior to patient entering clinic. Patient reports that she feels good today, but her ankles are a little sore (6/10) this morning. She also notes that her knees are a little tight (5/10) as well.    Pertinent History RA, right TKA, HTN, osteopenia.    How long can you sit comfortably? Unlimited.    How long can you stand comfortably? 5 minutes.    How long can you walk comfortably? 5 minutes.    Patient Stated Goals See above.    Currently in Pain? Yes    Pain Score 6     Pain Location Ankle    Pain Orientation Right;Left    Pain Descriptors / Indicators  Sore    Pain Onset More than a month ago                               [facility] Adult PT Treatment/Exercise - 10/15/21 0001       Knee/Hip Exercises: Aerobic   Nustep L4 x 15 minutes      Knee/Hip Exercises: Seated   Long Arc Quad Both;10 reps   limited by shooting pain in right knee   Other Seated Knee/Hip Exercises Heel/toe raises   20 reps   Other Seated Knee/Hip Exercises BAPS circles   CW and CCW; L1; 20 reps each; BLE     Knee/Hip Exercises: Supine   Hip Adduction Isometric Both;3 sets;10 reps      Manual Therapy   Manual Therapy Soft tissue mobilization    Soft tissue mobilization right quadriceps and hip adductors                          PT Long Term Goals - 09/21/21 1247       PT LONG TERM GOAL #1   Title Patient will be independent with HEP    Time 6  Period Weeks    Status New      PT LONG TERM GOAL #2   Title Patient will report ability to perform ADLs with low back and bilateral knee pain less than or equal to 4/10    Time 6    Period Weeks    Status New      PT LONG TERM GOAL #3   Title Patient will report ability to walk for 10 minutes or greater with low back pain and bilateral knee pain less than or equal to 4/10.    Time 6    Period Weeks    Status New      PT LONG TERM GOAL #4   Title Stand 15 minutes with pain not > 4/10.    Time 6    Period Weeks    Status New                   Plan - 10/15/21 0916     Clinical Impression Statement Patient was introduced to BAPS circles for improved ankle mobility needed for improved heel strike and toe off with gait. She required minimal tactile cuing with each of these motions to limit her hip mobility to isolate ankle mobility. She experienced a sharp pain in her right knee with long arc quads, but this was able to be completely relieved with soft tissue mobilization to the quadriceps and hip adductors. She repoted that her ankles were hurting a little more  while walking upon the conclusion of treatment. She continues to require skilled physical therapy to address her remaining impairments to safely maximize her functional mobility.    Personal Factors and Comorbidities Comorbidity 1;Comorbidity 2;Other    Comorbidities RA, right TKA, HTN, osteopenia.    Examination-Activity Limitations Other;Transfers;Locomotion Level    Examination-Participation Restrictions Other    Stability/Clinical Decision Making Evolving/Moderate complexity    Rehab Potential Good    PT Frequency 2x / week    PT Duration 6 weeks    PT Treatment/Interventions ADLs/Self Care Home Management;Cryotherapy;Electrical Stimulation;Ultrasound;Moist Heat;Gait training;Stair training;Functional mobility training;Therapeutic activities;Therapeutic exercise;Manual techniques;Patient/family education;Passive range of motion    PT Next Visit Plan STW/M to patient's lumbar musculature, Nustep, low-level core exercises, pain-free bilateral LE ther ex.; alternating standing and seated interventions due to fatigue    Consulted and Agree with Plan of Care Patient             Patient will benefit from skilled therapeutic intervention in order to improve the following deficits and impairments:  Abnormal gait, Decreased activity tolerance, Decreased mobility, Decreased range of motion, Decreased strength, Increased muscle spasms, Postural dysfunction, Pain  Visit Diagnosis: Chronic pain of right knee  Chronic pain of left knee  Pain of left knee after injury  Chronic left-sided low back pain with left-sided sciatica     Problem List Patient Active Problem List   Diagnosis Date Noted   Full code status 06/16/2020   Impaired mobility and endurance 06/16/2020   Sciatica of left side 06/16/2020   Rheumatoid arthritis involving multiple sites with positive rheumatoid factor ([place]) 03/06/2019   Primary osteoarthritis of both knees 03/06/2019   Former smoker 03/06/2019   ANA positive  02/12/2019   Family history of thyroid disease 01/08/2019   Morbid obesity ([place]) 01/08/2019   Essential hypertension 01/08/2019   Family history of colon cancer 01/08/2019    Darlin Coco, PT 10/15/2021, 10:33 AM  [facility] [address] [place], [place], [address] Phone: [phone]  Fax:  [phone]  Name: Jean Howell MRN: [id number] Date of Birth: 1956/04/10

## 2021-10-19 ENCOUNTER — Other Ambulatory Visit: Payer: Self-pay

## 2021-10-19 ENCOUNTER — Ambulatory Visit: Payer: Medicare Other

## 2021-10-19 DIAGNOSIS — M5442 Lumbago with sciatica, left side: Secondary | ICD-10-CM

## 2021-10-19 DIAGNOSIS — M25562 Pain in left knee: Secondary | ICD-10-CM

## 2021-10-19 DIAGNOSIS — R262 Difficulty in walking, not elsewhere classified: Secondary | ICD-10-CM | POA: Diagnosis not present

## 2021-10-19 DIAGNOSIS — G8929 Other chronic pain: Secondary | ICD-10-CM

## 2021-10-19 DIAGNOSIS — M25561 Pain in right knee: Secondary | ICD-10-CM

## 2021-10-19 NOTE — Therapy (Signed)
[facility] [place], [place], [address] Phone: [phone]   Fax:  [phone]  Physical Therapy Treatment  Patient Details  Name: Jean Howell MRN: [id number] Date of Birth: 11-17-1956 Referring Provider (PT): Ronnie Doss DO.   Encounter Date: 10/19/2021   PT End of Session - 10/19/21 0908     Visit Number 9    Number of Visits 12    Date for PT Re-Evaluation 12/20/21    PT Start Time 0900    PT Stop Time 0944    PT Time Calculation (min) 44 min    Activity Tolerance Patient tolerated treatment well    Behavior During Therapy WFL for tasks assessed/performed             Past Medical History:  Diagnosis Date   Arthritis    Depression    Hypertension    Rheumatoid arthritis ([place])     Past Surgical History:  Procedure Laterality Date   BREAST EXCISIONAL BIOPSY Bilateral    pt unsure when- benign   BREAST SURGERY     nodules removed   COLONOSCOPY N/A 06/25/2019   Procedure: COLONOSCOPY;  Surgeon: Danie Binder, MD;  [facility];  Service: Endoscopy;  Laterality: N/A;  1:30   POLYPECTOMY  06/25/2019   Procedure: POLYPECTOMY;  Surgeon: Danie Binder, MD;  [facility];  Service: Endoscopy;;    There were no vitals filed for this visit.   Subjective Assessment - 10/19/21 0901     Subjective COVID-19 screen performed prior to patient entering clinic. Patient reports that her right ankle is sore this morning, but otherwise she feels "pretty good."    Pertinent History RA, right TKA, HTN, osteopenia.    How long can you sit comfortably? Unlimited.    How long can you stand comfortably? 5 minutes.    How long can you walk comfortably? 5 minutes.    Patient Stated Goals See above.    Currently in Pain? Yes    Pain Score 4     Pain Location Ankle    Pain Orientation Right    Pain Descriptors / Indicators Sore    Pain Type Chronic pain    Pain Onset More than a month ago                                [facility] Adult PT Treatment/Exercise - 10/19/21 0001       Knee/Hip Exercises: Aerobic   Nustep L5 x 15 minutes      Knee/Hip Exercises: Standing   Forward Step Up Both;2 sets;10 reps;Step Height: 4";Hand Hold: 2    Other Standing Knee Exercises Pull downs   green XTS; 3x10     Knee/Hip Exercises: Seated   Long Arc Quad Strengthening;Both;Weights   2 minutes each   Long Arc Quad Weight 3 lbs.    Other Seated Knee/Hip Exercises Heel/toe raises   15 reps each; progressing to ankle circles (15 reps CW and CCW)                         PT Long Term Goals - 09/21/21 1247       PT LONG TERM GOAL #1   Title Patient will be independent with HEP    Time 6    Period Weeks    Status New      PT LONG TERM GOAL #  2   Title Patient will report ability to perform ADLs with low back and bilateral knee pain less than or equal to 4/10    Time 6    Period Weeks    Status New      PT LONG TERM GOAL #3   Title Patient will report ability to walk for 10 minutes or greater with low back pain and bilateral knee pain less than or equal to 4/10.    Time 6    Period Weeks    Status New      PT LONG TERM GOAL #4   Title Stand 15 minutes with pain not > 4/10.    Time 6    Period Weeks    Status New                   Plan - 10/19/21 0908     Clinical Impression Statement Treatment was initiated with ankle interventions as this was the primary limitation with walking. She required minimal cuing with today's standing interventions for upright stance needed to safely navigate her environment. She experienced moderate irritability of her ankle pain with today's standing interventions, but alternating between sitting and standing interventions were able to prevent a significant exacerbation in her familiar pain. She reported feeling tired, but good upon the conclusion of treatment. She continues to require skilled physical therapy  to address her remaining impairments to safely maximize her functional mobility.    Personal Factors and Comorbidities Comorbidity 1;Comorbidity 2;Other    Comorbidities RA, right TKA, HTN, osteopenia.    Examination-Activity Limitations Other;Transfers;Locomotion Level    Examination-Participation Restrictions Other    Stability/Clinical Decision Making Evolving/Moderate complexity    Rehab Potential Good    PT Frequency 2x / week    PT Duration 6 weeks    PT Treatment/Interventions ADLs/Self Care Home Management;Cryotherapy;Electrical Stimulation;Ultrasound;Moist Heat;Gait training;Stair training;Functional mobility training;Therapeutic activities;Therapeutic exercise;Manual techniques;Patient/family education;Passive range of motion    PT Next Visit Plan STW/M to patient's lumbar musculature, Nustep, low-level core exercises, pain-free bilateral LE ther ex.; alternating standing and seated interventions due to fatigue    Consulted and Agree with Plan of Care Patient             Patient will benefit from skilled therapeutic intervention in order to improve the following deficits and impairments:  Abnormal gait, Decreased activity tolerance, Decreased mobility, Decreased range of motion, Decreased strength, Increased muscle spasms, Postural dysfunction, Pain  Visit Diagnosis: Chronic pain of right knee  Chronic pain of left knee  Pain of left knee after injury  Chronic left-sided low back pain with left-sided sciatica     Problem List Patient Active Problem List   Diagnosis Date Noted   Full code status 06/16/2020   Impaired mobility and endurance 06/16/2020   Sciatica of left side 06/16/2020   Rheumatoid arthritis involving multiple sites with positive rheumatoid factor ([place]) 03/06/2019   Primary osteoarthritis of both knees 03/06/2019   Former smoker 03/06/2019   ANA positive 02/12/2019   Family history of thyroid disease 01/08/2019   Morbid obesity ([place]) 01/08/2019    Essential hypertension 01/08/2019   Family history of colon cancer 01/08/2019    Darlin Coco, PT 10/19/2021, 10:02 AM  [facility] [address] [place], [place], [address] Phone: [phone]   Fax:  [phone]  Name: Jean Howell MRN: [id number] Date of Birth: [birth year]

## 2021-10-22 ENCOUNTER — Ambulatory Visit: Payer: Medicare Other

## 2021-10-22 ENCOUNTER — Other Ambulatory Visit: Payer: Self-pay | Admitting: Rheumatology

## 2021-10-22 ENCOUNTER — Ambulatory Visit (INDEPENDENT_AMBULATORY_CARE_PROVIDER_SITE_OTHER): Payer: Medicare Other | Admitting: Physician Assistant

## 2021-10-22 ENCOUNTER — Other Ambulatory Visit: Payer: Self-pay

## 2021-10-22 ENCOUNTER — Encounter: Payer: Self-pay | Admitting: Physician Assistant

## 2021-10-22 ENCOUNTER — Other Ambulatory Visit (HOSPITAL_COMMUNITY): Payer: Self-pay

## 2021-10-22 ENCOUNTER — Telehealth: Payer: Self-pay | Admitting: Pharmacist

## 2021-10-22 VITALS — BP 117/78 | HR 97 | Resp 12 | Ht 63.0 in | Wt 216.0 lb

## 2021-10-22 DIAGNOSIS — Z87891 Personal history of nicotine dependence: Secondary | ICD-10-CM

## 2021-10-22 DIAGNOSIS — Z8349 Family history of other endocrine, nutritional and metabolic diseases: Secondary | ICD-10-CM | POA: Diagnosis not present

## 2021-10-22 DIAGNOSIS — R768 Other specified abnormal immunological findings in serum: Secondary | ICD-10-CM

## 2021-10-22 DIAGNOSIS — M25561 Pain in right knee: Secondary | ICD-10-CM

## 2021-10-22 DIAGNOSIS — M0579 Rheumatoid arthritis with rheumatoid factor of multiple sites without organ or systems involvement: Secondary | ICD-10-CM | POA: Diagnosis not present

## 2021-10-22 DIAGNOSIS — M25562 Pain in left knee: Secondary | ICD-10-CM | POA: Diagnosis not present

## 2021-10-22 DIAGNOSIS — G8929 Other chronic pain: Secondary | ICD-10-CM

## 2021-10-22 DIAGNOSIS — Z800 Family history of malignant neoplasm of digestive organs: Secondary | ICD-10-CM | POA: Diagnosis not present

## 2021-10-22 DIAGNOSIS — M170 Bilateral primary osteoarthritis of knee: Secondary | ICD-10-CM

## 2021-10-22 DIAGNOSIS — Z79899 Other long term (current) drug therapy: Secondary | ICD-10-CM

## 2021-10-22 DIAGNOSIS — R5383 Other fatigue: Secondary | ICD-10-CM

## 2021-10-22 DIAGNOSIS — R262 Difficulty in walking, not elsewhere classified: Secondary | ICD-10-CM | POA: Diagnosis not present

## 2021-10-22 DIAGNOSIS — M5442 Lumbago with sciatica, left side: Secondary | ICD-10-CM | POA: Diagnosis not present

## 2021-10-22 DIAGNOSIS — I10 Essential (primary) hypertension: Secondary | ICD-10-CM | POA: Diagnosis not present

## 2021-10-22 DIAGNOSIS — Z6838 Body mass index (BMI) 38.0-38.9, adult: Secondary | ICD-10-CM

## 2021-10-22 DIAGNOSIS — Z8659 Personal history of other mental and behavioral disorders: Secondary | ICD-10-CM

## 2021-10-22 MED ORDER — HUMIRA (2 PEN) 40 MG/0.4ML ~~LOC~~ AJKT
40.0000 mg | AUTO-INJECTOR | SUBCUTANEOUS | 0 refills | Status: DC
  Filled 2021-10-22: qty 2, 28d supply, fill #0

## 2021-10-22 NOTE — Patient Instructions (Addendum)
Standing Labs We placed an order today for your standing lab work.   Please have your standing labs drawn in 1 month and then every 3 months   If possible, please have your labs drawn 2 weeks prior to your appointment so that the provider can discuss your results at your appointment.  Please note that you may see your imaging and lab results in [facility] before we have reviewed them. We may be awaiting multiple results to interpret others before contacting you. Please allow our office up to 72 hours to thoroughly review all of the results before contacting the office for clarification of your results.  We have open lab daily: Monday through Thursday from 1:30-4:30 PM and Friday from 1:30-4:00 PM at the office of Dr. Bo Merino, [facility].   Please be advised, all patients with office appointments requiring lab work will take precedent over walk-in lab work.  If possible, please come for your lab work on Monday and Friday afternoons, as you may experience shorter wait times. The office is located at [address], [place], [place], [place] [address] No appointment is necessary.   Labs are drawn by [facility]. Please bring your co-pay at the time of your lab draw.  You may receive a bill from [facility] for your lab work.  If you wish to have your labs drawn at another location, please call the office 24 hours in advance to send orders.  If you have any questions regarding directions or hours of operation,  please call [phone].   As a reminder, please drink plenty of water prior to coming for your lab work. Thanks!  Etanercept Injection What is this medication? ETANERCEPT (et a [employer]) treats autoimmune conditions, such as psoriasis and certain types of arthritis. It works by slowing down an overactive immune system. It belongs to a group of medications called TNF inhibitors. This medicine may be used for other purposes; ask your health care provider or pharmacist if you  have questions. COMMON BRAND NAME(S): Enbrel What should I tell my care team before I take this medication? They need to know if you have any of these conditions: Bleeding disorder Cancer Diabetes Granulomatosis with polyangiitis Heart failure HIV or AIDS Immune system problems Infection such as tuberculosis (TB) or other bacterial, fungal or viral infections Liver disease Nervous system problems such as Guillain-Barre syndrome, multiple sclerosis or seizures Recent or upcoming vaccine An unusual or allergic reaction to etanercept, other medications, latex, rubber, food, dyes, or preservatives Pregnant or trying to get pregnant Breast-feeding How should I use this medication? The medication is injected under the skin. You will be taught how to prepare and give it. Take it as directed on the prescription label. Keep taking it unless your care team tells you stop. This medication comes with INSTRUCTIONS FOR USE. Ask your pharmacist for directions on how to use this medication. Read the information carefully. Talk to your pharmacist or care team if you have questions. If you use a pen, be sure to take off the outer needle cover before using the dose. It is important that you put your used needles and syringes in a special sharps container. Do not put them in a trash can. If you do not have a sharps container, call your pharmacist or care team to get one. A special MedGuide will be given to you by the pharmacist with each prescription and refill. Be sure to read this information carefully each time. Talk to your care team about  the use of this medication in children. While it may be prescribed for children as young as 15 years of age for selected conditions, precautions do apply. Overdosage: If you think you have taken too much of this medicine contact a poison control center or emergency room at once. NOTE: This medicine is only for you. Do not share this medicine with others. What if I miss a  dose? If you miss a dose, take it as soon as you can. If it is almost time for your next dose, take only that dose. Do not take double or extra doses. What may interact with this medication? Do not take this medication with any of the following: Biologic medications such as adalimumab, certolizumab, golimumab, infliximab Live vaccines Rilonacept This medication may also interact with the following: Abatacept [place] Biologic medications such as anifrolumab, baricitinib, belimumab, canakinumab, natalizumab, rituximab, sarilumab, tocilizumab, tofacitinib, upadacitinib, vedolizumab Cyclophosphamide Sulfasalazine This list may not describe all possible interactions. Give your health care provider a list of all the medicines, herbs, non-prescription drugs, or dietary supplements you use. Also tell them if you smoke, drink alcohol, or use illegal drugs. Some items may interact with your medicine. What should I watch for while using this medication? Visit your care team for regular checks on your progress. Tell your care team if your symptoms do not start to get better or if they get worse. This medication may increase your risk of getting an infection. Call your care team for advice if you get a fever, chills, sore throat, or other symptoms of a cold or flu. Do not treat yourself. Try to avoid being around people who are sick. If you have not had the measles or chickenpox vaccines, tell your care team right away if you are around someone with these viruses. You will be tested for tuberculosis (TB) before you start this medication. If your care team prescribes any medication for TB, you should start taking the TB medication before starting this medication. Make sure to finish the full course of TB medication. Avoid taking medications that contain aspirin, acetaminophen, ibuprofen, naproxen, or ketoprofen unless instructed by your care team. These medications may hide fever. Talk to your care team about  your risk of cancer. You may be more at risk for certain types of cancer if you take this medication. This medication can decrease the response to a vaccine. If you need to get vaccinated, tell your care team if you have received this medication. Extra booster doses may be needed. Talk to your care team to see if a different vaccination schedule is needed. What side effects may I notice from receiving this medication? Side effects that you should report to your care team as soon as possible: Allergic reactions--skin rash, itching, hives, swelling of the face, lips, tongue, or throat Body pain, tingling, or numbness Eye pain, change in vision, vision loss Heart failure--shortness of breath, swelling of the ankles, feet, or hands, sudden weight gain, unusual weakness or fatigue Infection--fever, chills, cough, sore throat, wounds that don't heal, pain or trouble when passing urine, general feeling of discomfort or being unwell Liver injury--right upper belly pain, loss of appetite, nausea, light-colored stool, dark yellow or brown urine, yellowing skin or eyes, unusual weakness or fatigue Low red blood cell level--unusual weakness or fatigue, dizziness, headache, trouble breathing Lupus-like syndrome--joint pain, swelling, or stiffness, butterfly-shaped rash on the face, rashes that get worse in the sun, fever, unusual weakness or fatigue New or worsening psoriasis--rash with itchy, scaly patches  Seizures Unusual bruising or bleeding Weakness in arms and legs Side effects that usually do not require medical attention (report to your care team if they continue or are bothersome): Headache Pain, redness, or irritation at injection site Sinus pain or pressure around the face or forehead This list may not describe all possible side effects. Call your doctor for medical advice about side effects. You may report side effects to FDA at 1-800-FDA-1088. Where should I keep my medication? Keep out of the  reach of children and pets. See product for storage information. Each product may have different instructions. Get rid of any unused medication after the expiration date. To get rid of medications that are no longer needed or have expired: Take the medication to a medication take-back program. Check with your pharmacy or law enforcement to find a location. If you cannot return the medication, ask your pharmacist or care team how to get rid of this medication safely. NOTE: This sheet is a summary. It may not cover all possible information. If you have questions about this medicine, talk to your doctor, pharmacist, or health care provider.  2022 Elsevier/Gold Standard (2021-07-16 00:00:00)

## 2021-10-22 NOTE — Telephone Encounter (Signed)
Next Visit: 10/22/2021  Last Visit: 05/21/2021   Last Fill: 07/31/2021  [id number] arthritis involving multiple sites with positive rheumatoid factor   Current Dose per office note 05/21/2021: Humira 40 mg sq injections every 14 days  Labs: 10/06/2021 10/06/2021   TB Gold: 01/27/2021 Neg    Okay to refill Humira?

## 2021-10-22 NOTE — Therapy (Signed)
[facility] [place], [place], [address] Phone: [phone]   Fax:  [phone]  Physical Therapy Treatment  Patient Details  Name: Jean Howell MRN: [id number] Date of Birth: [birth year] Referring Provider (PT): Ronnie Doss DO.   Encounter Date: 10/22/2021   PT End of Session - 10/22/21 0906     Visit Number 10    Number of Visits 12    Date for PT Re-Evaluation 12/20/21    PT Start Time 0900    PT Stop Time 0944    PT Time Calculation (min) 44 min    Activity Tolerance Patient tolerated treatment well    Behavior During Therapy WFL for tasks assessed/performed             Past Medical History:  Diagnosis Date   Arthritis    Depression    Hypertension    Rheumatoid arthritis ([place])     Past Surgical History:  Procedure Laterality Date   BREAST EXCISIONAL BIOPSY Bilateral    pt unsure when- benign   BREAST SURGERY     nodules removed   COLONOSCOPY N/A 06/25/2019   Procedure: COLONOSCOPY;  Surgeon: Danie Binder, MD;  [facility];  Service: Endoscopy;  Laterality: N/A;  1:30   POLYPECTOMY  06/25/2019   Procedure: POLYPECTOMY;  Surgeon: Danie Binder, MD;  [facility];  Service: Endoscopy;;    There were no vitals filed for this visit.   Subjective Assessment - 10/22/21 0905     Subjective COVID-19 screen performed prior to patient entering clinic. Patient reports that she feels pretty good today. However, her ankles are giving her a problem this morning.    Pertinent History RA, right TKA, HTN, osteopenia.    How long can you sit comfortably? Unlimited.    How long can you stand comfortably? 5 minutes.    How long can you walk comfortably? 5 minutes.    Patient Stated Goals See above.    Currently in Pain? Yes    Pain Score 6     Pain Location Ankle    Pain Type Chronic pain    Pain Onset More than a month ago                                [facility] Adult PT Treatment/Exercise - 10/22/21 0001       Knee/Hip Exercises: Aerobic   Nustep L5 x 15 minutes      Knee/Hip Exercises: Standing   Other Standing Knee Exercises Marching   1 minutes; modified to stepping forward due to bilateral ankle pain (2 minutes)     Knee/Hip Exercises: Seated   Long Probation officer;Both   3 minutes   Long Arc Quad Weight 3 lbs.    Other Seated Knee/Hip Exercises Heel/toe raises   3 minutes   Abduction/Adduction  Both   3 minutes                    PT Education - 10/22/21 1157     Education Details Plan of care, progress with therapy, and HEP    Person(s) Educated Patient    Methods Explanation    Comprehension Verbalized understanding                 PT Long Term Goals - 10/22/21 0920       PT LONG TERM GOAL #1   Title Patient  will be independent with HEP    Time 6    Period Weeks    Status On-going      PT LONG TERM GOAL #2   Title Patient will report ability to perform ADLs with low back and bilateral knee pain less than or equal to 4/10    Baseline 6/10    Time 6    Period Weeks    Status On-going      PT LONG TERM GOAL #3   Title Patient will report ability to walk for 10 minutes or greater with low back pain and bilateral knee pain less than or equal to 4/10.    Baseline 25-30 steps    Time 6    Period Weeks    Status On-going      PT LONG TERM GOAL #4   Title Stand 15 minutes with pain not > 4/10.    Time 6    Period Weeks    Status On-going                   Plan - 10/22/21 0906     Clinical Impression Statement Patient has made minimal progress with skilled physical therapy as she continues to be significantly limited by fatigue and pain.She was unable to stand for more than four minutes with today's activities due to fatigue and lower extremity pain. She was encouraged to continue with her HEP at home as she notes that she was not doing her exercises much. Recommend that she she  continue with her current plan of care to address her remaining impairments to maximize her functional mobility. However, if she is unable to report or demonstrate improvements at the conclusion of her plan of care, she will be discharged due to a plateau in progress.    Personal Factors and Comorbidities Comorbidity 1;Comorbidity 2;Other    Comorbidities RA, right TKA, HTN, osteopenia.    Examination-Activity Limitations Other;Transfers;Locomotion Level    Examination-Participation Restrictions Other    Stability/Clinical Decision Making Evolving/Moderate complexity    Rehab Potential Good    PT Frequency 2x / week    PT Duration 6 weeks    PT Treatment/Interventions ADLs/Self Care Home Management;Cryotherapy;Electrical Stimulation;Ultrasound;Moist Heat;Gait training;Stair training;Functional mobility training;Therapeutic activities;Therapeutic exercise;Manual techniques;Patient/family education;Passive range of motion    PT Next Visit Plan STW/M to patient's lumbar musculature, Nustep, low-level core exercises, pain-free bilateral LE ther ex.; alternating standing and seated interventions due to fatigue    Consulted and Agree with Plan of Care Patient             Patient will benefit from skilled therapeutic intervention in order to improve the following deficits and impairments:  Abnormal gait, Decreased activity tolerance, Decreased mobility, Decreased range of motion, Decreased strength, Increased muscle spasms, Postural dysfunction, Pain  Visit Diagnosis: Chronic pain of right knee  Chronic pain of left knee  Pain of left knee after injury  Chronic left-sided low back pain with left-sided sciatica     Problem List Patient Active Problem List   Diagnosis Date Noted   Full code status 06/16/2020   Impaired mobility and endurance 06/16/2020   Sciatica of left side 06/16/2020   Rheumatoid arthritis involving multiple sites with positive rheumatoid factor ([place]) 03/06/2019    Primary osteoarthritis of both knees 03/06/2019   Former smoker 03/06/2019   ANA positive 02/12/2019   Family history of thyroid disease 01/08/2019   Morbid obesity ([place]) 01/08/2019   Essential hypertension 01/08/2019   Family history of colon  cancer 01/08/2019    Darlin Coco, PT 10/22/2021, 11:58 AM  [facility] [address] [place], [place], [address] Phone: [phone]   Fax:  [phone]  Name: Jean Howell MRN: [id number] Date of Birth: [birth year]  Progress Note Reporting Period 09/21/21 to 10/22/21  See note below for Objective Data and Assessment of Progress/Goals.    See clinical impression statement

## 2021-10-22 NOTE — Telephone Encounter (Signed)
Please start Enbrel Mini BIV.  Dose:50mg  SQ every 7 days  Dx: Rheumatoid arthritis (M05.9)  Previously tried therapies: Humira - inadequate clinical response Hydroxychloroquine - inadequate clinical response Methotrexate - elevated creatinine  Can start Enbrel on or after 11/02/21  Knox Saliva, PharmD, MPH, BCPS Clinical Pharmacist (Rheumatology and Pulmonology)

## 2021-10-23 NOTE — Telephone Encounter (Signed)
Submitted a Prior Authorization request to [facility] for ENBREL via CoverMyMeds. Will update once we receive a response.  Key: [id number]  Knox Saliva, PharmD, MPH, BCPS Clinical Pharmacist (Rheumatology and Pulmonology)

## 2021-10-26 ENCOUNTER — Other Ambulatory Visit (HOSPITAL_COMMUNITY): Payer: Self-pay

## 2021-10-26 ENCOUNTER — Other Ambulatory Visit: Payer: Self-pay

## 2021-10-26 ENCOUNTER — Ambulatory Visit: Payer: Medicare Other

## 2021-10-26 DIAGNOSIS — R262 Difficulty in walking, not elsewhere classified: Secondary | ICD-10-CM | POA: Diagnosis not present

## 2021-10-26 DIAGNOSIS — M25562 Pain in left knee: Secondary | ICD-10-CM

## 2021-10-26 DIAGNOSIS — M5442 Lumbago with sciatica, left side: Secondary | ICD-10-CM

## 2021-10-26 DIAGNOSIS — M25561 Pain in right knee: Secondary | ICD-10-CM | POA: Diagnosis not present

## 2021-10-26 DIAGNOSIS — G8929 Other chronic pain: Secondary | ICD-10-CM

## 2021-10-26 MED ORDER — ENBREL MINI 50 MG/ML ~~LOC~~ SOCT
50.0000 mg | SUBCUTANEOUS | 0 refills | Status: DC
Start: 2021-10-26 — End: 2021-11-03
  Filled 2021-10-26: qty 4, 28d supply, fill #0

## 2021-10-26 NOTE — Therapy (Addendum)
[facility] [place], [place], [address] Phone: [phone]   Fax:  [phone]  Physical Therapy Treatment  Patient Details  Name: Jean Howell MRN: [id number] Date of Birth: 08/16/1956 Referring Provider (PT): Ronnie Doss DO.   Encounter Date: 10/26/2021   PT End of Session - 10/26/21 0905     Visit Number 11    Number of Visits 12    Date for PT Re-Evaluation 12/20/21    PT Start Time 0900    PT Stop Time 0944    PT Time Calculation (min) 44 min    Activity Tolerance Patient tolerated treatment well    Behavior During Therapy WFL for tasks assessed/performed             Past Medical History:  Diagnosis Date   Arthritis    Depression    Hypertension    Rheumatoid arthritis ([place])     Past Surgical History:  Procedure Laterality Date   BREAST EXCISIONAL BIOPSY Bilateral    pt unsure when- benign   BREAST SURGERY     nodules removed   COLONOSCOPY N/A 06/25/2019   Procedure: COLONOSCOPY;  Surgeon: Danie Binder, MD;  [facility];  Service: Endoscopy;  Laterality: N/A;  1:30   POLYPECTOMY  06/25/2019   Procedure: POLYPECTOMY;  Surgeon: Danie Binder, MD;  [facility];  Service: Endoscopy;;    There were no vitals filed for this visit.   Subjective Assessment - 10/26/21 0902     Subjective COVID-19 screen performed prior to patient entering clinic. Patient reports that her knees feel good today. However, her ankles still hurting abou the same.    Pertinent History RA, right TKA, HTN, osteopenia.    How long can you sit comfortably? Unlimited.    How long can you stand comfortably? 5 minutes.    How long can you walk comfortably? 5 minutes.    Patient Stated Goals See above.    Currently in Pain? Yes    Pain Score 6     Pain Location Ankle    Pain Orientation Right;Left    Pain Onset More than a month ago                               [facility]  Adult PT Treatment/Exercise - 10/26/21 0001       Knee/Hip Exercises: Aerobic   Nustep L5 x 15 minutes      Knee/Hip Exercises: Standing   Heel Raises Both;Other (comment);2 sets   2x1 minutes   Hip Abduction Both;Knee straight   2 minutes   Abduction Limitations limited by right ankle pain    Hip Extension Both;Knee straight   2 minutes     Knee/Hip Exercises: Seated   Long Arc Quad Strengthening;Both;20 reps;Weights    Long Arc Quad Weight 4 lbs.    Other Seated Knee/Hip Exercises Clamshell   red; BLE; 3 minutes                         PT Long Term Goals - 10/22/21 0920       PT LONG TERM GOAL #1   Title Patient will be independent with HEP    Time 6    Period Weeks    Status On-going      PT LONG TERM GOAL #2   Title Patient will report ability to perform ADLs with  low back and bilateral knee pain less than or equal to 4/10    Baseline 6/10    Time 6    Period Weeks    Status On-going      PT LONG TERM GOAL #3   Title Patient will report ability to walk for 10 minutes or greater with low back pain and bilateral knee pain less than or equal to 4/10.    Baseline 25-30 steps    Time 6    Period Weeks    Status On-going      PT LONG TERM GOAL #4   Title Stand 15 minutes with pain not > 4/10.    Time 6    Period Weeks    Status On-going                   Plan - 10/26/21 0906     Clinical Impression Statement Patient was progressed with standing heel raises in addition to familiar interventions. She required minimal cuing with standing heel raises to promote upright stance. She required frequent rest breaks between each of today's standing interventions. She reported feeling tired and sore upon the conclusion of treatment. She continues to require skilled physical therapy to maximize her functional mobility.    Personal Factors and Comorbidities Comorbidity 1;Comorbidity 2;Other    Comorbidities RA, right TKA, HTN, osteopenia.     Examination-Activity Limitations Other;Transfers;Locomotion Level    Examination-Participation Restrictions Other    Stability/Clinical Decision Making Evolving/Moderate complexity    Rehab Potential Good    PT Frequency 2x / week    PT Duration 6 weeks    PT Treatment/Interventions ADLs/Self Care Home Management;Cryotherapy;Electrical Stimulation;Ultrasound;Moist Heat;Gait training;Stair training;Functional mobility training;Therapeutic activities;Therapeutic exercise;Manual techniques;Patient/family education;Passive range of motion    PT Next Visit Plan STW/M to patient's lumbar musculature, Nustep, low-level core exercises, pain-free bilateral LE ther ex.; alternating standing and seated interventions due to fatigue    Consulted and Agree with Plan of Care Patient             Patient will benefit from skilled therapeutic intervention in order to improve the following deficits and impairments:  Abnormal gait, Decreased activity tolerance, Decreased mobility, Decreased range of motion, Decreased strength, Increased muscle spasms, Postural dysfunction, Pain  Visit Diagnosis: Chronic pain of right knee  Chronic pain of left knee  Pain of left knee after injury  Chronic left-sided low back pain with left-sided sciatica     Problem List Patient Active Problem List   Diagnosis Date Noted   Full code status 06/16/2020   Impaired mobility and endurance 06/16/2020   Sciatica of left side 06/16/2020   Rheumatoid arthritis involving multiple sites with positive rheumatoid factor ([place]) 03/06/2019   Primary osteoarthritis of both knees 03/06/2019   Former smoker 03/06/2019   ANA positive 02/12/2019   Family history of thyroid disease 01/08/2019   Morbid obesity ([place]) 01/08/2019   Essential hypertension 01/08/2019   Family history of colon cancer 01/08/2019   PHYSICAL THERAPY DISCHARGE SUMMARY  Visits from Start of Care: 11  Current functional level related to goals / functional  outcomes: Patient was unable to meet her goals for physical therapy.    Remaining deficits: See progress report performed on 10/22/21   Education / Equipment: HEP   Patient agrees to discharge. Patient goals were not met. Patient is being discharged due to not returning since the last visit.   Darlin Coco, PT 10/26/2021, 9:54 AM  [facility] [address]  [place], [place], [address] Phone: [phone]   Fax:  [phone]  Name: Jean Howell MRN: [id number] Date of Birth: 01/05/1956

## 2021-10-26 NOTE — Telephone Encounter (Signed)
Received notification from [facility] regarding a prior authorization for ENBREL. Authorization has been APPROVED from 10/23/21 to 12/05/22.   Per test claim, patient has no copay for 28 day supply  Patient can fill through [place]: [phone]   Authorization # [id number]  Rx for Enbrel Mini sent to [facility] to be couriered to clinic by 11/02/21 ATC patient to schedule Enbrel new start on or after 11/02/21. Routing to [place] to assist  Knox Saliva, PharmD, MPH, BCPS Clinical Pharmacist (Rheumatology and Pulmonology)

## 2021-10-27 ENCOUNTER — Other Ambulatory Visit (HOSPITAL_COMMUNITY): Payer: Self-pay

## 2021-10-27 NOTE — Telephone Encounter (Signed)
Returned call to patient to schedule Enbrel new start. Unable to reach. Left VM advising that she can call to schedule new start visit on or after 11/03/21 and that she does not need to speak with me directly to have this appt scheduled  Knox Saliva, PharmD, MPH, BCPS Clinical Pharmacist (Rheumatology and Pulmonology)

## 2021-10-30 ENCOUNTER — Other Ambulatory Visit (HOSPITAL_COMMUNITY): Payer: Self-pay

## 2021-11-03 ENCOUNTER — Ambulatory Visit: Payer: Medicare Other | Admitting: Pharmacist

## 2021-11-03 ENCOUNTER — Other Ambulatory Visit (HOSPITAL_COMMUNITY): Payer: Self-pay

## 2021-11-03 ENCOUNTER — Other Ambulatory Visit: Payer: Self-pay

## 2021-11-03 DIAGNOSIS — Z7189 Other specified counseling: Secondary | ICD-10-CM

## 2021-11-03 DIAGNOSIS — M0579 Rheumatoid arthritis with rheumatoid factor of multiple sites without organ or systems involvement: Secondary | ICD-10-CM

## 2021-11-03 DIAGNOSIS — Z79899 Other long term (current) drug therapy: Secondary | ICD-10-CM

## 2021-11-03 MED ORDER — ENBREL MINI 50 MG/ML ~~LOC~~ SOCT
50.0000 mg | SUBCUTANEOUS | 1 refills | Status: DC
  Filled 2021-11-03 – 2021-11-27 (×2): qty 4, 28d supply, fill #0
  Filled 2021-12-28: qty 4, 28d supply, fill #1

## 2021-11-03 NOTE — Addendum Note (Signed)
Addended by: Cassandria Anger on: 11/03/2021 10:47 AM   Modules accepted: Orders

## 2021-11-03 NOTE — Progress Notes (Addendum)
Pharmacy Note  Subjective:   Patient presents to clinic today to receive first dose of Enbrel. She has previously taken Humira and inadequate clinical response. She had inadequate clinical response to hydroxychloroquine. Methotrexate caused elevated creatinine  She currently takes leflunomide 20mg  once daily.  Patient running a fever or have signs/symptoms of infection? No  Patient currently on antibiotics for the treatment of infection? No  Patient have any upcoming invasive procedures/surgeries? No  Objective: CMP     Component Value Date/Time   NA 138 10/06/2021 0945   K 4.4 10/06/2021 0945   CL 100 10/06/2021 0945   CO2 25 10/06/2021 0945   GLUCOSE 93 10/06/2021 0945   GLUCOSE 92 01/07/2021 1410   BUN 15 10/06/2021 0945   CREATININE 1.07 (H) 10/06/2021 0945   CREATININE 0.81 01/07/2021 1410   CALCIUM 9.7 10/06/2021 0945   PROT 7.3 10/06/2021 0945   ALBUMIN 3.4 (L) 10/06/2021 0945   AST 17 10/06/2021 0945   ALT 18 10/06/2021 0945   ALKPHOS 99 10/06/2021 0945   BILITOT 0.3 10/06/2021 0945   GFRNONAA 76 01/07/2021 1410   GFRAA 88 01/07/2021 1410    CBC    Component Value Date/Time   WBC 6.2 10/06/2021 0945   WBC 8.7 01/07/2021 1410   RBC 4.43 10/06/2021 0945   RBC 4.25 01/07/2021 1410   HGB 12.3 10/06/2021 0945   HCT 37.7 10/06/2021 0945   PLT 389 10/06/2021 0945   MCV 85 10/06/2021 0945   MCH 27.8 10/06/2021 0945   MCH 27.3 01/07/2021 1410   MCHC 32.6 10/06/2021 0945   MCHC 32.9 01/07/2021 1410   RDW 14.8 10/06/2021 0945   LYMPHSABS 2.2 10/06/2021 0945   EOSABS 0.3 10/06/2021 0945   BASOSABS 0.0 10/06/2021 0945    Baseline Immunosuppressant Therapy Labs TB GOLD Quantiferon TB Gold Latest Ref Rng & Units 01/07/2021  Quantiferon TB Gold Plus NEGATIVE NEGATIVE   Hepatitis Panel Hepatitis Latest Ref Rng & Units 02/26/2019  Hep B Surface Ag NON-REACTI NON-REACTIVE  Hep B IgM NON-REACTI NON-REACTIVE   HIV Lab Results  Component Value Date   HIV Non  Reactive 01/08/2019   Immunoglobulins Immunoglobulin Electrophoresis Latest Ref Rng & Units 02/26/2019  IgA  70 - 320 mg/dL 967(H)  IgG 600 - 1,540 mg/dL 1,848(H)  IgM 50 - 300 mg/dL 118   SPEP Serum Protein Electrophoresis Latest Ref Rng & Units 10/06/2021  Total Protein 6.0 - 8.5 g/dL 7.3  Albumin 3.8 - 4.8 g/dL -  Alpha-1 0.2 - 0.3 g/dL -  Alpha-2 0.5 - 0.9 g/dL -  Beta Globulin 0.4 - 0.6 g/dL -  Beta 2 0.2 - 0.5 g/dL -  Gamma Globulin 0.8 - 1.7 g/dL -   G6PD Lab Results  Component Value Date   G6PDH 14.3 02/26/2019   Chest x-ray: 06/11/2019 - no active cardiopulmonary disease  Assessment/Plan:  Demonstrated proper injection technique with Enbrel demo device  Patient able to demonstrate proper injection technique using the teach back method.  Patient self injected in the tight thigh with:  Sample Medication: Enbrel Mini prefilled cartridge x 1 NDC: [phone] Lot: [id number] Expiration: 10/2023  Sample Medication: Enbrel Autotouch Connect autoinjector NDC: [address]-66 Lot: [id number] Expiration: 11/04/2024  Patient tolerated well - states injection did hurt but will plan to leave medication at room temperature for longer for next week's injection.  Observed for 30 mins in office for adverse reaction and none noted.   Patient is to return in 1 month for labs and 6-8 weeks  for follow-up appointment.  Standing orders placed.   Enbrel approved through insurance .   Rx sent to: [facility]: [phone] .  Patient provided with pharmacy phone number. She has already received the first shipment at home - for next 4 weeks  She will continue Enbrel 50 mg SQ every 7 days in combination with leflunomide 20mg  once daily. Patient sees dermatology yearly and will continue to do so while taking Enbrel due to risk for non-melanoma skin cancer while taking Enbrel  All questions encouraged and answered.  Instructed patient to call with any further questions  or concerns.  Knox Saliva, PharmD, MPH, BCPS Clinical Pharmacist (Rheumatology and Pulmonology)  11/03/2021 9:05 AM

## 2021-11-03 NOTE — Patient Instructions (Signed)
Your next ENBREL dose is due on 11/10/21, 11/17/21, and every 7 days thereafter  [place] (Leflunomide) 20mg  once daily  HOLD ENBREL if you have signs or symptoms of an infection. You can resume once you feel better or back to your baseline. HOLD ENBREL if you start antibiotics to treat an infection. HOLD ENBREL around the time of surgery/procedures. Your surgeon will be able to provide recommendations on when to hold BEFORE and when you are cleared to [place].  Pharmacy information: Your prescription will be shipped from [facility]. Their phone number is [phone] Please call to schedule shipment and confirm address. They will mail your medication to your home.  Labs are due in 1 month then every 3 months. Lab hours are from Monday to Thursday 1:30-4:30pm and Friday 1:30-4pm. You do not need an appointment if you come for labs during these times.  How to manage an injection site reaction: Remember the 5 C's: COUNTER - leave on the counter at least 30 minutes but up to overnight to bring medication to room temperature. This may help prevent stinging COLD - place something cold (like an ice gel pack or cold water bottle) on the injection site just before cleansing with alcohol. This may help reduce pain CLARITIN - use Claritin (generic name is loratadine) for the first two weeks of treatment or the day of, the day before, and the day after injecting. This will help to minimize injection site reactions CORTISONE CREAM - apply if injection site is irritated and itching CALL ME - if injection site reaction is bigger than the size of your fist, looks infected, blisters, or if you develop hives

## 2021-11-08 IMAGING — MG MM DIGITAL SCREENING BILAT W/ TOMO AND CAD
8 series · 8 of 24 positions shown · non-contrast
Comparison: Previous exam(s).

CLINICAL DATA: Screening.

EXAM:
DIGITAL SCREENING BILATERAL MAMMOGRAM WITH TOMOSYNTHESIS AND CAD
TECHNIQUE: Bilateral screening digital craniocaudal and mediolateral oblique
mammograms were obtained. Bilateral screening digital breast
tomosynthesis was performed. The images were evaluated with
computer-aided detection.

[R MLO synth-2D]
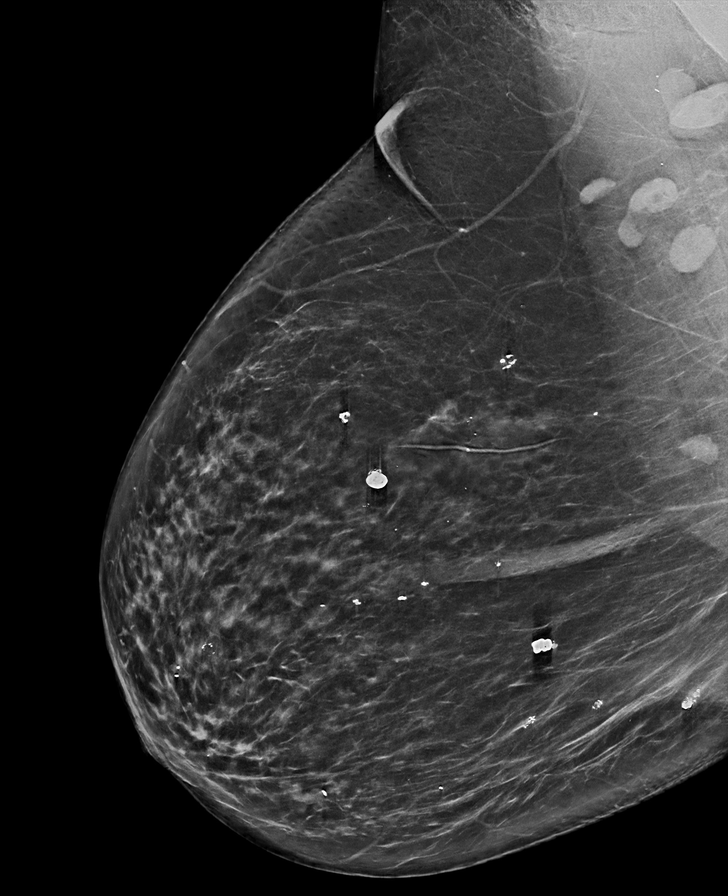

[R CC synth-2D]
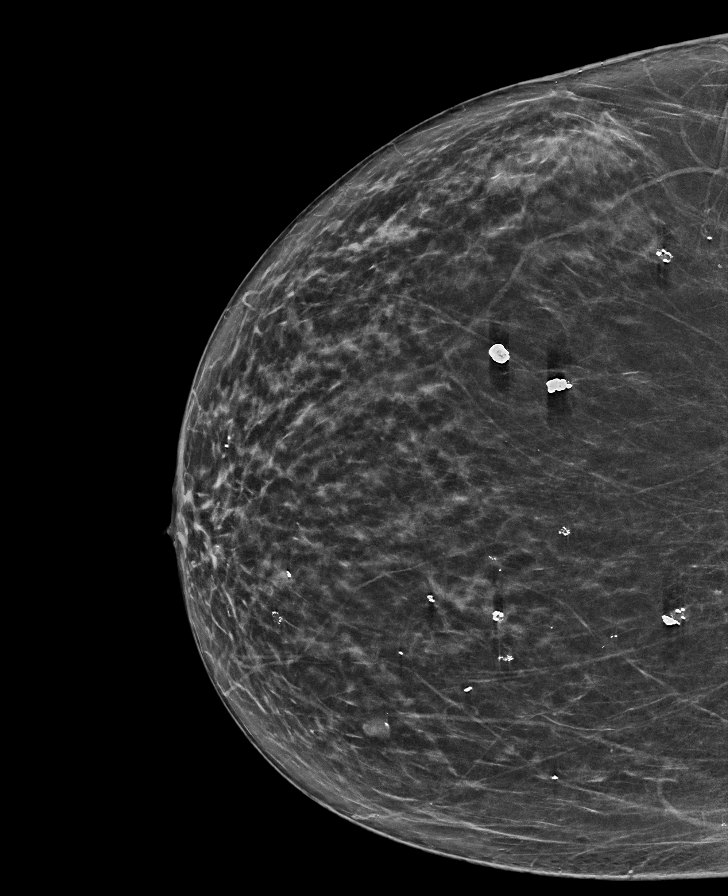

[L MLO synth-2D]
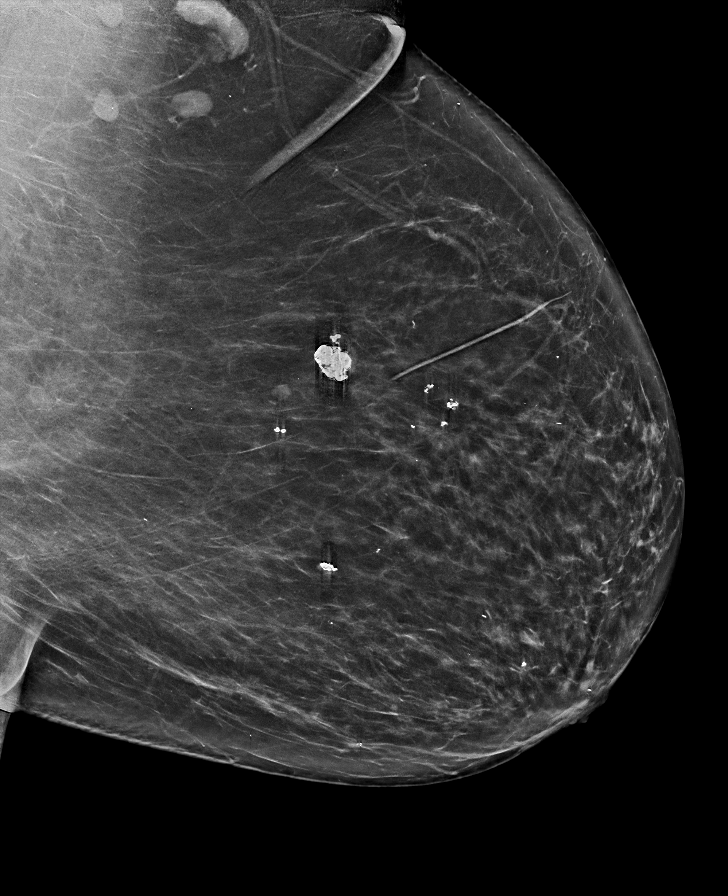

[L CC synth-2D]
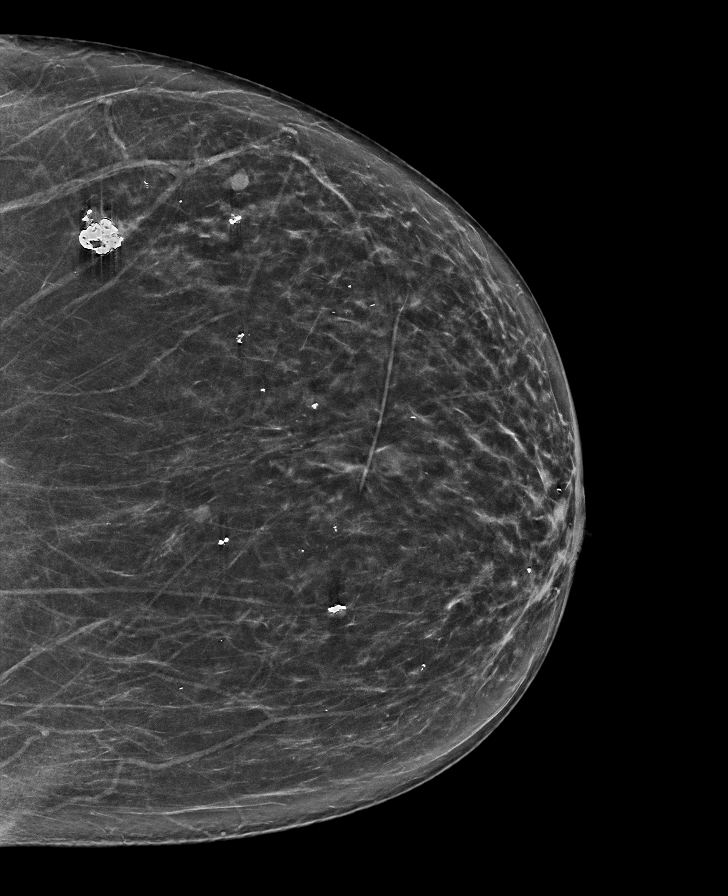

[R MLO tomo · tomo slice 45/88.0]
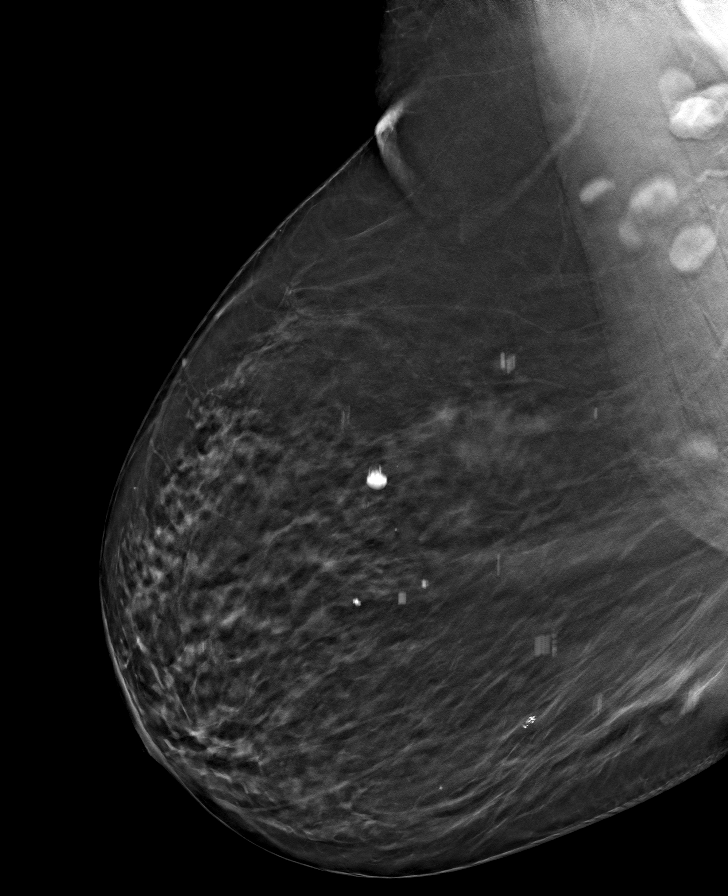

[L MLO tomo · tomo slice 43/86.0]
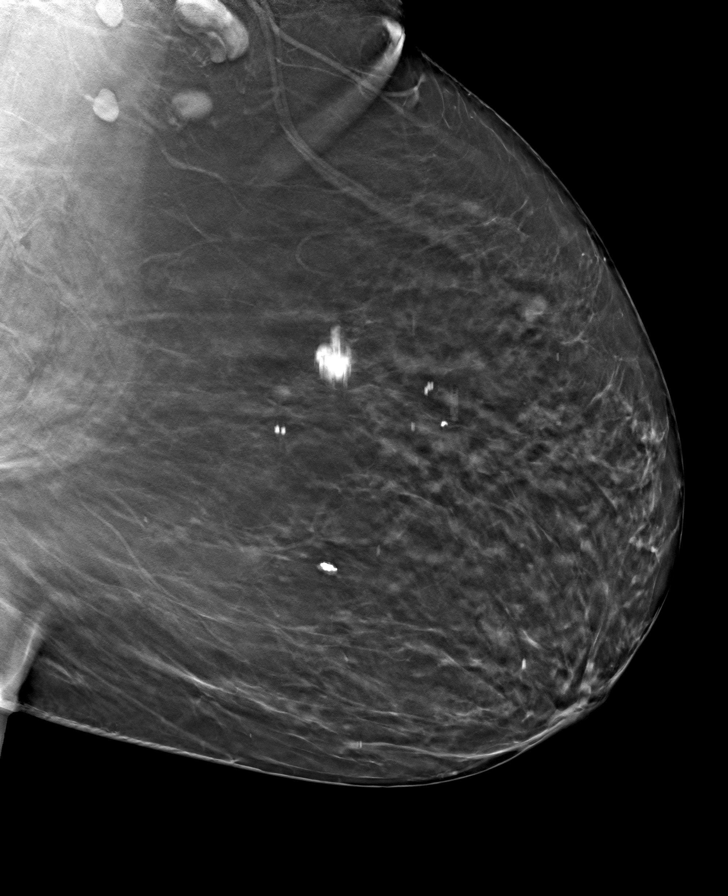

[L CC tomo · tomo slice 39/76.0]
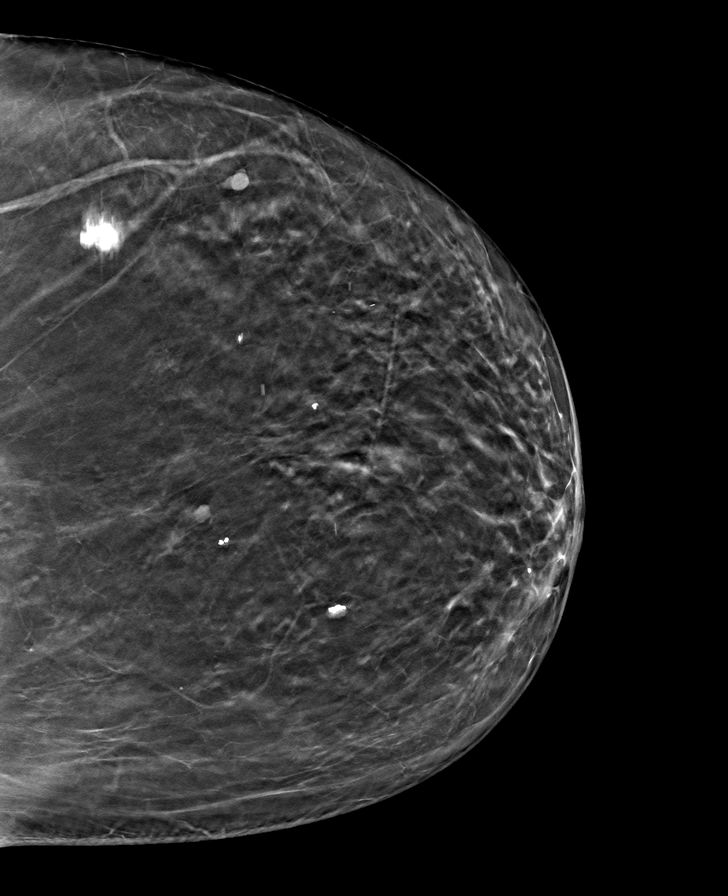

[R CC tomo · tomo slice 36/71.0]
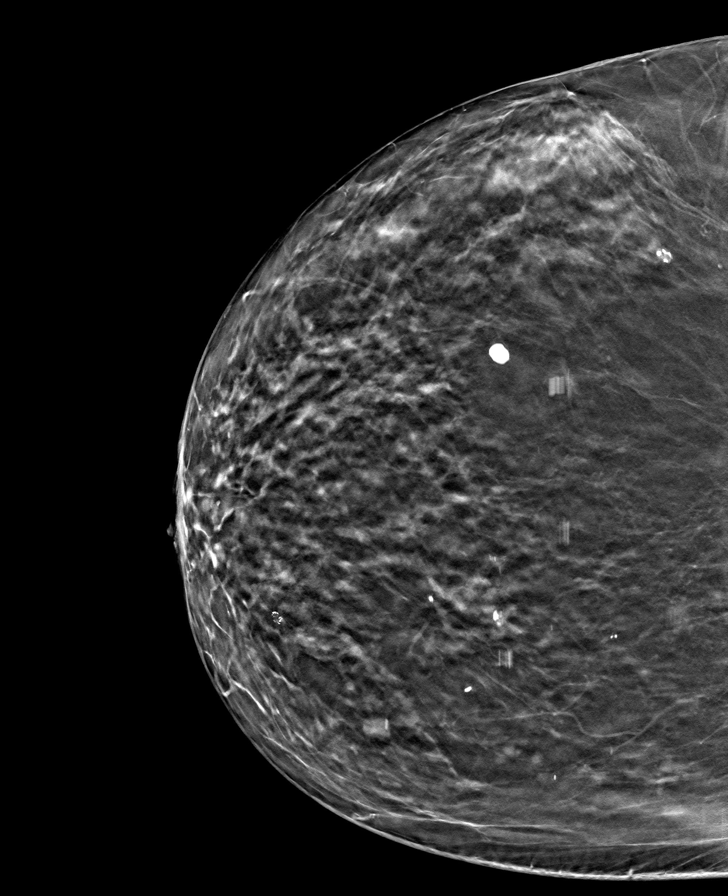

[8 of 24 positions shown; findings below may reference images not displayed]

ACR Breast Density Category b: There are scattered areas of
fibroglandular density.
FINDINGS: There are no findings suspicious for malignancy.
IMPRESSION: No mammographic evidence of malignancy. A result letter of this
screening mammogram will be mailed directly to the patient.

RECOMMENDATION:
Screening mammogram in one year. (Code:51-O-LD2)

BI-RADS CATEGORY  1: Negative.

## 2021-11-20 ENCOUNTER — Telehealth: Payer: Self-pay | Admitting: Pharmacy Technician

## 2021-11-20 NOTE — Telephone Encounter (Signed)
Received notification from [facility] regarding a prior authorization for ENBREL. Authorization has been APPROVED from 10/23/21 to 12/05/22.    Authorization # [id number]

## 2021-11-23 ENCOUNTER — Other Ambulatory Visit (HOSPITAL_COMMUNITY): Payer: Self-pay

## 2021-11-25 ENCOUNTER — Other Ambulatory Visit (HOSPITAL_COMMUNITY): Payer: Self-pay

## 2021-11-27 ENCOUNTER — Other Ambulatory Visit (HOSPITAL_COMMUNITY): Payer: Self-pay

## 2021-12-02 ENCOUNTER — Other Ambulatory Visit (HOSPITAL_COMMUNITY): Payer: Self-pay

## 2021-12-03 ENCOUNTER — Other Ambulatory Visit: Payer: Self-pay | Admitting: *Deleted

## 2021-12-03 DIAGNOSIS — M0579 Rheumatoid arthritis with rheumatoid factor of multiple sites without organ or systems involvement: Secondary | ICD-10-CM | POA: Diagnosis not present

## 2021-12-03 DIAGNOSIS — Z79899 Other long term (current) drug therapy: Secondary | ICD-10-CM | POA: Diagnosis not present

## 2021-12-03 NOTE — Progress Notes (Signed)
Office Visit Note  Patient: Jean Howell             Date of Birth: 1956/02/20           MRN: [id number]             PCP: [name], DO Referring: [name], DO Visit Date: 12/17/2021 Occupation: @GUAROCC @  Subjective:  Medication monitoring  History of Present Illness: Tamiki Kuba is a 65 y.o. female with history of seropositive rheumatoid arthritis and osteoarthritis.  She is on enbrel 50 mg sq injections once weekly and arava 20 mg 1 tablet daily.  She was started on Enbrel on 11/03/2021.  She previously had an inadequate response to Humira and hydroxychloroquine.  She discontinued methotrexate in the past due to elevated creatinine.  Patient reports that she has noticed 100% improvement in her joint pain and inflammation since initiating Enbrel.  She states that within several days of her first dose her knee joint pain started to improve.  She denies any joint swelling at this time.  She has had less stiffness in her knees and has been able to increase her exercise regimen.  She has some soreness in her feet but denies any inflammation.  She will be scheduling an upcoming procedure to have a mole excised on her right foot which will be performed by Dr. Lacinda Axon.  According to the patient she was advised to hold Enbrel and Arava 2 weeks prior to the procedure and 2 weeks after the procedure.     Activities of Daily Living:  Patient reports joint stiffness all day  Patient Reports nocturnal pain.  Difficulty dressing/grooming: Denies Difficulty climbing stairs: Reports Difficulty getting out of chair: Reports Difficulty using hands for taps, buttons, cutlery, and/or writing: Reports  Review of Systems  Constitutional:  Positive for fatigue.  HENT:  Positive for mouth dryness. Negative for mouth sores and nose dryness.   Eyes:  Negative for pain, visual disturbance and dryness.  Respiratory:  Negative for cough, hemoptysis, shortness of breath and difficulty  breathing.   Cardiovascular:  Negative for chest pain, palpitations, hypertension and swelling in legs/feet.  Gastrointestinal:  Negative for blood in stool, constipation and diarrhea.  Endocrine: Negative for excessive thirst and increased urination.  Genitourinary:  Negative for difficulty urinating and painful urination.  Musculoskeletal:  Positive for joint pain, gait problem, joint pain, muscle weakness, morning stiffness and muscle tenderness. Negative for myalgias and myalgias.  Skin:  Negative for color change, pallor, rash, hair loss, nodules/bumps, skin tightness, ulcers and sensitivity to sunlight.  Allergic/Immunologic: Negative for susceptible to infections.  Neurological:  Negative for dizziness and headaches.  Hematological:  Negative for bruising/bleeding tendency and swollen glands.  Psychiatric/Behavioral:  Negative for depressed mood and sleep disturbance. The patient is not nervous/anxious.    PMFS History:  Patient Active Problem List   Diagnosis Date Noted   Full code status 06/16/2020   Impaired mobility and endurance 06/16/2020   Sciatica of left side 06/16/2020   Rheumatoid arthritis involving multiple sites with positive rheumatoid factor ([place]) 03/06/2019   Primary osteoarthritis of both knees 03/06/2019   Former smoker 03/06/2019   ANA positive 02/12/2019   Family history of thyroid disease 01/08/2019   Morbid obesity ([place]) 01/08/2019   Essential hypertension 01/08/2019   Family history of colon cancer 01/08/2019    Past Medical History:  Diagnosis Date   Arthritis    Depression    Hypertension    Rheumatoid arthritis ([place])  Family History  Problem Relation Age of Onset   Cancer Father    Colon cancer Father    Thyroid disease Mother    CVA Mother 89   Thyroid disease Sister    Healthy Son    Congestive Heart Failure Sister    Hypertension Sister    Diabetes Sister    Hypertension Sister    Diabetes Sister    Hypertension Sister     Hypertension Sister    Cancer Brother        kidney    Past Surgical History:  Procedure Laterality Date   BREAST EXCISIONAL BIOPSY Bilateral    pt unsure when- benign   BREAST SURGERY     nodules removed   COLONOSCOPY N/A 06/25/2019   Procedure: COLONOSCOPY;  Surgeon: Danie Binder, MD;  [facility]: [facility];  Service: Endoscopy;  Laterality: N/A;  1:30   POLYPECTOMY  06/25/2019   Procedure: POLYPECTOMY;  Surgeon: Danie Binder, MD;  Location: [facility];  Service: Endoscopy;;   Social History   Social History Narrative   Patient is a widow that resides independently in [place].  She has 1 son, who resides in [place].   Immunization History  Administered Date(s) Administered   Fluad Quad(high Dose 65+) 09/16/2020   Influenza, Quadrivalent, Recombinant, Inj, Pf 09/19/2019   PFIZER(Purple Top)SARS-COV-2 Vaccination 05/23/2020, 06/13/2020, 07/31/2020, 02/28/2021     Objective: Vital Signs: BP (!) 134/97 (BP Location: Left Arm, Patient Position: Sitting, Cuff Size: Small)    Pulse 91    Resp 12    Ht 5\' 3"  (1.6 m)    Wt 215 lb 12.8 oz (97.9 kg)    BMI 38.23 kg/m    Physical Exam Vitals and nursing note reviewed.  Constitutional:      Appearance: She is well-developed.  HENT:     Head: Normocephalic and atraumatic.  Eyes:     Conjunctiva/sclera: Conjunctivae normal.  Pulmonary:     Effort: Pulmonary effort is normal.  Abdominal:     Palpations: Abdomen is soft.  Musculoskeletal:     Cervical back: Normal range of motion.  Skin:    General: Skin is warm and dry.     Capillary Refill: Capillary refill takes less than 2 seconds.  Neurological:     Mental Status: She is alert and oriented to person, place, and time.  Psychiatric:        Behavior: Behavior normal.     Musculoskeletal Exam: C-spine has good range of motion on examination today.  Shoulder joints have good range of motion bilaterally.  Elbow joints have good range of motion with no  tenderness or inflammation.  Wrist joints have slightly limited extension but no tenderness or inflammation.  No tenderness over MCP or PIP joints.  Complete fist formation bilaterally.  Hip joints have good range of motion with no discomfort on examination today.  Some ongoing warmth and swelling was noted in the left knee.  Right knee joint had good range of motion with no warmth or effusion.  Ankle joints have good range of motion with some tenderness in the right ankle.  CDAI Exam: CDAI Score: 2.3  Patient Global: 0 mm; Provider Global: 3 mm Swollen: 1 ; Tender: 1  Joint Exam 12/17/2021      Right  Left  Knee     Swollen Tender     Investigation: No additional findings.  Imaging: No results found.  Recent Labs: Lab Results  Component Value Date  WBC 7.5 12/03/2021   HGB 12.8 12/03/2021   PLT 368 12/03/2021   NA 139 12/03/2021   K 4.1 12/03/2021   CL 101 12/03/2021   CO2 27 12/03/2021   GLUCOSE 82 12/03/2021   BUN 16 12/03/2021   CREATININE 1.05 12/03/2021   BILITOT 0.4 12/03/2021   ALKPHOS 99 10/06/2021   AST 19 12/03/2021   ALT 16 12/03/2021   PROT 7.5 12/03/2021   ALBUMIN 3.4 (L) 10/06/2021   CALCIUM 8.8 12/03/2021   GFRAA 88 01/07/2021   QFTBGOLDPLUS NEGATIVE 01/07/2021    Speciality Comments: PLQ Eye Exam: 12/16/2018 WNL @ Dr Anthony Sar Follow up in 1 year  Procedures:  No procedures performed Allergies: Patient has no known allergies.   Assessment / Plan:     Visit Diagnoses: Rheumatoid arthritis involving multiple sites with positive rheumatoid factor (HCC) - Positive RF, +14-3-3 eta, Positive ANA, erosive inflammatory: She has some ongoing warmth and swelling in the left knee joint on examination today.  The pain in both of her knees has resolved since initiating Enbrel on 11/03/2021.  She no longer has tenderness and synovitis in her hands or wrist joints.  She has continue to wear arthritis compression gloves which alleviate her discomfort.  According to the  patient she has noticed 100% improvement in her joint pain and inflammation since switching from Humira to Enbrel.  She has been tolerating Enbrel without any side effects or injection site reactions.  She has not had any recent infections.  She has been able to increase her exercise regimen since her knee joint pain has resolved.  She continues to have some soreness in both ankles but no inflammation was noted on examination today.  She will remain on Enbrel and Arava as combination therapy.  She was advised to notify us if she develops increased joint pain or inflammation.  She will follow-up in the office in 3 months.  High risk medication use - Enbrel 50 mg subcutaneous injections once weekly and Arava 20 mg 1 tablet by mouth daily. Initiated Enbrel on 11/03/2021.   CBC and CMP updated on 12/03/2021.  She will be due to update lab work in March and every 3 months to monitor for drug toxicity.  Standing orders for CBC and CMP remain in place.  TB Gold negative on 01/07/2021.  Future order for TB gold placed today. Inadequate response to Humira and Plaquenil in the past.  Previously discontinued methotrexate due to elevated creatinine. Discussed the importance of holding Enbrel and Arava if she develops signs or symptoms of an infection and to resume once infection has completely cleared. Discussed the importance of yearly skin examinations while on Enbrel. She will be having a mole excised from her right foot which will be performed by Dr. Lacinda Axon.  The procedure has not been scheduled yet.  According to the patient she has been advised to hold Enbrel and Arava 2 weeks prior to the procedure and 2 weeks after the procedure.  Screening for tuberculosis -Future order for TB gold placed today. Plan: QuantiFERON-TB Gold Plus  Primary osteoarthritis and rheumatoid arthritis of both knees: She has ongoing warmth and swelling in the left knee joint on examination today.  The range of motion in both of her knees  has improved since her last office visit on 10/22/2021.  She is not experiencing any discomfort in her knee joints at this time but has occasional stiffness if sitting for prolonged periods of time.  Overall she has noticed 100% improvement  in her knee joint pain since initiating Enbrel on 11/03/2021.  She has been able to increase her exercise regimen since her pain has resolved. She will remain on Enbrel as prescribed.  ANA positive - She has no clinical features of systemic lupus.  Other fatigue: Stable. She has started to increase her walking regimen since her knee joint pain has resolved.   Other medical conditions are listed as follows:   Essential hypertension  History of depression  Former smoker  Family history of thyroid disease  Family history of colon cancer    Orders: Orders Placed This Encounter  Procedures   QuantiFERON-TB Gold Plus   No orders of the defined types were placed in this encounter.     Follow-Up Instructions: Return in about 3 months (around 03/17/2022) for Rheumatoid arthritis, Osteoarthritis.   Ofilia Neas, PA-C  Note - This record has been created using Dragon software.  Chart creation errors have been sought, but may not always  have been located. Such creation errors do not reflect on  the standard of medical care.

## 2021-12-04 LAB — CBC WITH DIFFERENTIAL/PLATELET
Absolute Monocytes: 293 cells/uL (ref 200–950)
Basophils Absolute: 8 cells/uL (ref 0–200)
Basophils Relative: 0.1 %
Eosinophils Absolute: 203 cells/uL (ref 15–500)
Eosinophils Relative: 2.7 %
HCT: 39.9 % (ref 35.0–45.0)
Hemoglobin: 12.8 g/dL (ref 11.7–15.5)
Lymphs Abs: 1695 cells/uL (ref 850–3900)
MCH: 27.3 pg (ref 27.0–33.0)
MCHC: 32.1 g/dL (ref 32.0–36.0)
MCV: 85.1 fL (ref 80.0–100.0)
MPV: 10.7 fL (ref 7.5–12.5)
Monocytes Relative: 3.9 %
Neutro Abs: 5303 cells/uL (ref 1500–7800)
Neutrophils Relative %: 70.7 %
Platelets: 368 10*3/uL (ref 140–400)
RBC: 4.69 10*6/uL (ref 3.80–5.10)
RDW: 14.2 % (ref 11.0–15.0)
Total Lymphocyte: 22.6 %
WBC: 7.5 10*3/uL (ref 3.8–10.8)

## 2021-12-04 LAB — COMPLETE METABOLIC PANEL WITH GFR
AG Ratio: 0.8 (calc) — ABNORMAL LOW (ref 1.0–2.5)
ALT: 16 U/L (ref 6–29)
AST: 19 U/L (ref 10–35)
Albumin: 3.3 g/dL — ABNORMAL LOW (ref 3.6–5.1)
Alkaline phosphatase (APISO): 69 U/L (ref 37–153)
BUN: 16 mg/dL (ref 7–25)
CO2: 27 mmol/L (ref 20–32)
Calcium: 8.8 mg/dL (ref 8.6–10.4)
Chloride: 101 mmol/L (ref 98–110)
Creat: 1.05 mg/dL (ref 0.50–1.05)
Globulin: 4.2 g/dL (calc) — ABNORMAL HIGH (ref 1.9–3.7)
Glucose, Bld: 82 mg/dL (ref 65–99)
Potassium: 4.1 mmol/L (ref 3.5–5.3)
Sodium: 139 mmol/L (ref 135–146)
Total Bilirubin: 0.4 mg/dL (ref 0.2–1.2)
Total Protein: 7.5 g/dL (ref 6.1–8.1)
eGFR: 58 mL/min/{1.73_m2} — ABNORMAL LOW (ref >=60)

## 2021-12-04 NOTE — Progress Notes (Signed)
CBC is normal.  GFR is low and stable.

## 2021-12-09 ENCOUNTER — Other Ambulatory Visit (HOSPITAL_COMMUNITY): Payer: Self-pay

## 2021-12-16 DIAGNOSIS — D485 Neoplasm of uncertain behavior of skin: Secondary | ICD-10-CM | POA: Diagnosis not present

## 2021-12-16 DIAGNOSIS — R69 Illness, unspecified: Secondary | ICD-10-CM | POA: Diagnosis not present

## 2021-12-17 ENCOUNTER — Ambulatory Visit (INDEPENDENT_AMBULATORY_CARE_PROVIDER_SITE_OTHER): Payer: Commercial Managed Care - HMO | Admitting: Physician Assistant

## 2021-12-17 ENCOUNTER — Other Ambulatory Visit: Payer: Self-pay

## 2021-12-17 ENCOUNTER — Encounter: Payer: Self-pay | Admitting: Physician Assistant

## 2021-12-17 VITALS — BP 134/97 | HR 91 | Resp 12 | Ht 63.0 in | Wt 215.8 lb

## 2021-12-17 DIAGNOSIS — M0579 Rheumatoid arthritis with rheumatoid factor of multiple sites without organ or systems involvement: Secondary | ICD-10-CM | POA: Diagnosis not present

## 2021-12-17 DIAGNOSIS — Z9225 Personal history of immunosupression therapy: Secondary | ICD-10-CM | POA: Diagnosis not present

## 2021-12-17 DIAGNOSIS — Z87891 Personal history of nicotine dependence: Secondary | ICD-10-CM

## 2021-12-17 DIAGNOSIS — Z111 Encounter for screening for respiratory tuberculosis: Secondary | ICD-10-CM

## 2021-12-17 DIAGNOSIS — Z800 Family history of malignant neoplasm of digestive organs: Secondary | ICD-10-CM | POA: Diagnosis not present

## 2021-12-17 DIAGNOSIS — R69 Illness, unspecified: Secondary | ICD-10-CM | POA: Diagnosis not present

## 2021-12-17 DIAGNOSIS — Z79899 Other long term (current) drug therapy: Secondary | ICD-10-CM | POA: Diagnosis not present

## 2021-12-17 DIAGNOSIS — M170 Bilateral primary osteoarthritis of knee: Secondary | ICD-10-CM

## 2021-12-17 DIAGNOSIS — R5383 Other fatigue: Secondary | ICD-10-CM | POA: Diagnosis not present

## 2021-12-17 DIAGNOSIS — R7689 Other specified abnormal immunological findings in serum: Secondary | ICD-10-CM

## 2021-12-17 DIAGNOSIS — I10 Essential (primary) hypertension: Secondary | ICD-10-CM | POA: Diagnosis not present

## 2021-12-17 DIAGNOSIS — R768 Other specified abnormal immunological findings in serum: Secondary | ICD-10-CM

## 2021-12-17 DIAGNOSIS — Z8659 Personal history of other mental and behavioral disorders: Secondary | ICD-10-CM

## 2021-12-17 DIAGNOSIS — Z8349 Family history of other endocrine, nutritional and metabolic diseases: Secondary | ICD-10-CM | POA: Diagnosis not present

## 2021-12-17 NOTE — Patient Instructions (Signed)
Standing Labs °We placed an order today for your standing lab work.  ° °Please have your standing labs drawn in March and every 3 months  ° ° °If possible, please have your labs drawn 2 weeks prior to your appointment so that the provider can discuss your results at your appointment. ° °Please note that you may see your imaging and lab results in MyChart before we have reviewed them. °We may be awaiting multiple results to interpret others before contacting you. °Please allow our office up to 72 hours to thoroughly review all of the results before contacting the office for clarification of your results. ° °We have open lab daily: °Monday through Thursday from 1:30-4:30 PM and Friday from 1:30-4:00 PM °at the office of Dr. Shaili Deveshwar,  Rheumatology.   °Please be advised, all patients with office appointments requiring lab work will take precedent over walk-in lab work.  °If possible, please come for your lab work on Monday and Friday afternoons, as you may experience shorter wait times. °The office is located at [address], [address], [place], [place] [address] °No appointment is necessary.   °Labs are drawn by [facility]. Please bring your co-pay at the time of your lab draw.  You may receive a bill from [facility] for your lab work. ° °If you wish to have your labs drawn at another location, please call the office 24 hours in advance to send orders. ° °If you have any questions regarding directions or hours of operation,  °please call [phone].   °As a reminder, please drink plenty of water prior to coming for your lab work. Thanks! ° °

## 2021-12-28 ENCOUNTER — Other Ambulatory Visit (HOSPITAL_COMMUNITY): Payer: Self-pay

## 2021-12-31 ENCOUNTER — Other Ambulatory Visit (HOSPITAL_COMMUNITY): Payer: Self-pay

## 2022-01-01 ENCOUNTER — Other Ambulatory Visit: Payer: Self-pay | Admitting: Physician Assistant

## 2022-01-01 NOTE — Telephone Encounter (Signed)
Next Visit: 03/17/2022  Last Visit: 12/17/2021  Last Fill: 10/07/2021  DX: Rheumatoid arthritis involving multiple sites with positive rheumatoid factor   Current Dose per office note 12/17/2021: Arava 20 mg 1 tablet by mouth daily  Labs: 12/03/2021 CBC is normal.  GFR is low and stable.  Okay to refill Arava?

## 2022-01-06 DIAGNOSIS — D485 Neoplasm of uncertain behavior of skin: Secondary | ICD-10-CM | POA: Diagnosis not present

## 2022-01-22 ENCOUNTER — Other Ambulatory Visit: Payer: Self-pay | Admitting: Physician Assistant

## 2022-01-22 ENCOUNTER — Other Ambulatory Visit (HOSPITAL_COMMUNITY): Payer: Self-pay

## 2022-01-22 DIAGNOSIS — M0579 Rheumatoid arthritis with rheumatoid factor of multiple sites without organ or systems involvement: Secondary | ICD-10-CM

## 2022-01-22 MED ORDER — ENBREL MINI 50 MG/ML ~~LOC~~ SOCT
50.0000 mg | SUBCUTANEOUS | 1 refills | Status: DC
  Filled 2022-02-03: qty 4, 28d supply, fill #0
  Filled 2022-03-12: qty 4, 28d supply, fill #1

## 2022-01-22 NOTE — Telephone Encounter (Signed)
Next Visit: 03/17/2022   Last Visit: 12/17/2021   Last Fill: 11/03/2021  DX: Rheumatoid arthritis involving multiple sites with positive rheumatoid factor    Current Dose per office note 12/17/2021: Enbrel 50 mg subcutaneous injections once weekly and   Labs: 12/03/2021 CBC is normal.  GFR is low and stable  TB Gold: 01/07/2021 Neg   Okay to refill Enbrel?

## 2022-01-26 ENCOUNTER — Other Ambulatory Visit: Payer: Self-pay | Admitting: Plastic Surgery

## 2022-01-26 DIAGNOSIS — D2371 Other benign neoplasm of skin of right lower limb, including hip: Secondary | ICD-10-CM | POA: Diagnosis not present

## 2022-01-26 DIAGNOSIS — D2271 Melanocytic nevi of right lower limb, including hip: Secondary | ICD-10-CM | POA: Diagnosis not present

## 2022-01-26 HISTORY — PX: MOLE REMOVAL: SHX2046

## 2022-02-03 ENCOUNTER — Other Ambulatory Visit (HOSPITAL_COMMUNITY): Payer: Self-pay

## 2022-02-11 ENCOUNTER — Other Ambulatory Visit (HOSPITAL_COMMUNITY): Payer: Self-pay

## 2022-02-26 ENCOUNTER — Other Ambulatory Visit (HOSPITAL_COMMUNITY): Payer: Self-pay

## 2022-02-26 DIAGNOSIS — D485 Neoplasm of uncertain behavior of skin: Secondary | ICD-10-CM | POA: Diagnosis not present

## 2022-03-01 ENCOUNTER — Other Ambulatory Visit: Payer: Self-pay | Admitting: *Deleted

## 2022-03-01 ENCOUNTER — Other Ambulatory Visit (HOSPITAL_COMMUNITY): Payer: Self-pay

## 2022-03-01 DIAGNOSIS — Z111 Encounter for screening for respiratory tuberculosis: Secondary | ICD-10-CM

## 2022-03-01 DIAGNOSIS — Z9225 Personal history of immunosupression therapy: Secondary | ICD-10-CM

## 2022-03-01 DIAGNOSIS — Z79899 Other long term (current) drug therapy: Secondary | ICD-10-CM | POA: Diagnosis not present

## 2022-03-04 NOTE — Progress Notes (Signed)
? ?Office Visit Note ? ?Patient: Jean [place]             ?Date of Birth: 12-Feb-1956           ?MRN: [id number]             ?PCP: [name], DO ?Referring: [name], DO ?Visit Date: 03/17/2022 ?Occupation: '@GUAROCC'$ @ ? ?Subjective:  ?Pain in both ankles and feet ? ?History of Present Illness: Jean Howell is a 66 y.o. female history of rheumatoid arthritis and terminal osteoarthritis.  She has been taking Enbrel weekly and also leflunomide 20 mg p.o. daily without any side effects.  She has not noticed any joint swelling.  Although she continues to have pain and discomfort in her bilateral knee joints in her bilateral ankles.  She states she has difficulty walking and doing routine activities but she is not in discomfort while she is sitting. ? ?Activities of Daily Living:  ?Patient reports morning stiffness for all day. ?Patient Reports nocturnal pain.  ?Difficulty dressing/grooming: Denies ?Difficulty climbing stairs: Reports ?Difficulty getting out of chair: Reports ?Difficulty using hands for taps, buttons, cutlery, and/or writing: Reports ? ?Review of Systems  ?Constitutional:  Negative for fatigue.  ?HENT:  Negative for mouth sores, mouth dryness and nose dryness.   ?Eyes:  Negative for pain, itching and dryness.  ?Respiratory:  Negative for shortness of breath and difficulty breathing.   ?Cardiovascular:  Negative for chest pain and palpitations.  ?Gastrointestinal:  Negative for blood in stool, constipation and diarrhea.  ?Endocrine: Negative for increased urination.  ?Genitourinary:  Negative for difficulty urinating.  ?Musculoskeletal:  Positive for joint pain, joint pain, joint swelling, myalgias, morning stiffness, muscle tenderness and myalgias.  ?Skin:  Negative for color change, rash and redness.  ?Allergic/Immunologic: Negative for susceptible to infections.  ?Neurological:  Positive for dizziness and numbness. Negative for headaches, memory loss and weakness.  ?Hematological:   Negative for bruising/bleeding tendency.  ?Psychiatric/Behavioral:  Negative for confusion.   ? ?PMFS History:  ?Patient Active Problem List  ? Diagnosis Date Noted  ? Osteopenia of neck of right femur 03/16/2022  ? Full code status 06/16/2020  ? Impaired mobility and endurance 06/16/2020  ? Sciatica of left side 06/16/2020  ? Rheumatoid arthritis involving multiple sites with positive rheumatoid factor ([place]) 03/06/2019  ? Primary osteoarthritis of both knees 03/06/2019  ? Former smoker 03/06/2019  ? ANA positive 02/12/2019  ? Family history of thyroid disease 01/08/2019  ? Morbid obesity ([place]) 01/08/2019  ? Essential hypertension 01/08/2019  ? Family history of colon cancer 01/08/2019  ?  ?Past Medical History:  ?Diagnosis Date  ? Arthritis   ? Depression   ? Hypertension   ? Rheumatoid arthritis ([place])   ?  ?Family History  ?Problem Relation Age of Onset  ? Cancer Father   ? Colon cancer Father   ? Thyroid disease Mother   ? CVA Mother 82  ? Thyroid disease Sister   ? Healthy Son   ? Congestive Heart Failure Sister   ? Hypertension Sister   ? Diabetes Sister   ? Hypertension Sister   ? Diabetes Sister   ? Hypertension Sister   ? Hypertension Sister   ? Cancer Brother   ?     kidney   ? ?Past Surgical History:  ?Procedure Laterality Date  ? BREAST EXCISIONAL BIOPSY Bilateral   ? pt unsure when- benign  ? BREAST SURGERY    ? nodules removed  ? COLONOSCOPY N/A 06/25/2019  ?  Procedure: COLONOSCOPY;  Surgeon: Danie Binder, MD;  [facility];  Service: Endoscopy;  Laterality: N/A;  1:30  ? MOLE REMOVAL Right 01/26/2022  ? foot (patient was put to sleep for procedure)  ? POLYPECTOMY  06/25/2019  ? Procedure: POLYPECTOMY;  Surgeon: Danie Binder, MD;  [facility];  Service: Endoscopy;;  ? ?Social History  ? ?Social History Narrative  ? Patient is a widow that resides independently in [place].  She has 1 son, who resides in [place].  ? ?Immunization History  ?Administered Date(s)  Administered  ? Fluad Quad(high Dose 65+) 09/16/2020  ? Influenza, Quadrivalent, Recombinant, Inj, Pf 09/19/2019  ? PFIZER(Purple Top)SARS-COV-2 Vaccination 05/23/2020, 06/13/2020, 07/31/2020, 02/28/2021  ?  ? ?Objective: ?Vital Signs: BP 138/79 (BP Location: Left Arm, Patient Position: Sitting, Cuff Size: Large)   Pulse 93   Ht '5\' 3"'$  (1.6 m)   Wt 220 lb 12.8 oz (100.2 kg)   BMI 39.11 kg/m?   ? ?Physical Exam ?Vitals and nursing note reviewed.  ?Constitutional:   ?   Appearance: She is well-developed.  ?HENT:  ?   Head: Normocephalic and atraumatic.  ?Eyes:  ?   Conjunctiva/sclera: Conjunctivae normal.  ?Cardiovascular:  ?   Rate and Rhythm: Normal rate and regular rhythm.  ?   Heart sounds: Normal heart sounds.  ?Pulmonary:  ?   Effort: Pulmonary effort is normal.  ?   Breath sounds: Normal breath sounds.  ?Abdominal:  ?   General: Bowel sounds are normal.  ?   Palpations: Abdomen is soft.  ?Musculoskeletal:  ?   Cervical back: Normal range of motion.  ?Lymphadenopathy:  ?   Cervical: No cervical adenopathy.  ?Skin: ?   General: Skin is warm and dry.  ?   Capillary Refill: Capillary refill takes less than 2 seconds.  ?Neurological:  ?   Mental Status: She is alert and oriented to person, place, and time.  ?Psychiatric:     ?   Behavior: Behavior normal.  ?  ? ?Musculoskeletal Exam: C-spine was in good range of motion.  Shoulder joints, elbow joints, wrist joints, MCPs PIPs and DIPs with good range of motion with no synovitis.  Hip joints were difficult to assess in the sitting position.  Knee joints with good range of motion without any warmth swelling or effusion.  She has some tenderness over ankles and MTPs.  No synovitis was noted. ? ?CDAI Exam: ?CDAI Score: 0.6  ?Patient Global: 3 mm; Provider Global: 3 mm ?Swollen: 0 ; Tender: 0  ?Joint Exam 03/17/2022  ? ?No joint exam has been documented for this visit  ? ?There is currently no information documented on the homunculus. Go to the Rheumatology activity  and complete the homunculus joint exam. ? ?Investigation: ?No additional findings. ? ?Imaging: ?No results found. ? ?Recent Labs: ?Lab Results  ?Component Value Date  ? WBC 4.8 03/01/2022  ? HGB 12.6 03/01/2022  ? PLT 340 03/01/2022  ? NA 139 03/01/2022  ? K 4.5 03/01/2022  ? CL 104 03/01/2022  ? CO2 28 03/01/2022  ? GLUCOSE 85 03/01/2022  ? BUN 18 03/01/2022  ? CREATININE 0.97 03/01/2022  ? BILITOT 0.3 03/01/2022  ? ALKPHOS 99 10/06/2021  ? AST 18 03/01/2022  ? ALT 19 03/01/2022  ? PROT 7.4 03/01/2022  ? ALBUMIN 3.4 (L) 10/06/2021  ? CALCIUM 9.6 03/01/2022  ? GFRAA 88 01/07/2021  ? QFTBGOLDPLUS NEGATIVE 03/01/2022  ? ? ?Speciality Comments: PLQ Eye Exam: 12/16/2018 WNL @  Dr Anthony Sar Follow up in 1 year ? ?Procedures:  ?No procedures performed ?Allergies: Patient has no known allergies.  ? ?Assessment / Plan:     ?Visit Diagnoses: Rheumatoid arthritis involving multiple sites with positive rheumatoid factor (HCC) - Positive RF, +14-3-3 eta, Positive ANA, erosive inflammatory:  -Patient states she is doing well on the combination of Enbrel and leflunomide.  She had no synovitis on examination.  She continues to have some stiffness in her hands, knee joints and her feet.  I will obtain x-rays today to monitor for disease progression.  Plan: XR Hand 2 View Right, XR Hand 2 View Left, XR Foot 2 Views Right, XR Foot 2 Views Left.  X-rays of bilateral hands were obtained today.  No radiographic progression was noted when compared to the x-rays of 2020.  X-rays of bilateral feet were obtained today.  Possible new erosion was noted over the left fifth metatarsal. ? ?High risk medication use - Enbrel 50 mg subcutaneous injections once weekly and Arava 20 mg 1 tablet by mouth daily.Initiated Enbrel on 11/03/2021.  Labs from March 01, 2022 were reviewed.  CBC and CMP were within normal limits except low albumin due to chronic disease.  TB gold was negative on March 01, 2022.  She was advised to get labs every 3 months to monitor  for drug toxicity.  TB gold will be next year.  Information about immunization was placed in the AVS.  She was advised to hold Enbrel and leflunomide in case she develops an infection and resume after the infectio

## 2022-03-05 LAB — COMPLETE METABOLIC PANEL WITH GFR
AG Ratio: 0.9 (calc) — ABNORMAL LOW (ref 1.0–2.5)
ALT: 19 U/L (ref 6–29)
AST: 18 U/L (ref 10–35)
Albumin: 3.4 g/dL — ABNORMAL LOW (ref 3.6–5.1)
Alkaline phosphatase (APISO): 77 U/L (ref 37–153)
BUN: 18 mg/dL (ref 7–25)
CO2: 28 mmol/L (ref 20–32)
Calcium: 9.6 mg/dL (ref 8.6–10.4)
Chloride: 104 mmol/L (ref 98–110)
Creat: 0.97 mg/dL (ref 0.50–1.05)
Globulin: 4 g/dL (calc) — ABNORMAL HIGH (ref 1.9–3.7)
Glucose, Bld: 85 mg/dL (ref 65–99)
Potassium: 4.5 mmol/L (ref 3.5–5.3)
Sodium: 139 mmol/L (ref 135–146)
Total Bilirubin: 0.3 mg/dL (ref 0.2–1.2)
Total Protein: 7.4 g/dL (ref 6.1–8.1)
eGFR: 64 mL/min/{1.73_m2} (ref >=60)

## 2022-03-05 LAB — CBC WITH DIFFERENTIAL/PLATELET
Absolute Monocytes: 336 cells/uL (ref 200–950)
Basophils Absolute: 19 cells/uL (ref 0–200)
Basophils Relative: 0.4 %
Eosinophils Absolute: 278 cells/uL (ref 15–500)
Eosinophils Relative: 5.8 %
HCT: 39.1 % (ref 35.0–45.0)
Hemoglobin: 12.6 g/dL (ref 11.7–15.5)
Lymphs Abs: 1954 cells/uL (ref 850–3900)
MCH: 27.8 pg (ref 27.0–33.0)
MCHC: 32.2 g/dL (ref 32.0–36.0)
MCV: 86.1 fL (ref 80.0–100.0)
MPV: 10.9 fL (ref 7.5–12.5)
Monocytes Relative: 7 %
Neutro Abs: 2213 cells/uL (ref 1500–7800)
Neutrophils Relative %: 46.1 %
Platelets: 340 10*3/uL (ref 140–400)
RBC: 4.54 10*6/uL (ref 3.80–5.10)
RDW: 14.8 % (ref 11.0–15.0)
Total Lymphocyte: 40.7 %
WBC: 4.8 10*3/uL (ref 3.8–10.8)

## 2022-03-05 LAB — QUANTIFERON-TB GOLD PLUS
Mitogen-NIL: 6.17 IU/mL
NIL: 0.06 IU/mL
QuantiFERON-TB Gold Plus: NEGATIVE
TB1-NIL: <0 IU/mL
TB2-NIL: <0 IU/mL

## 2022-03-05 NOTE — Progress Notes (Signed)
CBC and CMP are stable.

## 2022-03-05 NOTE — Progress Notes (Signed)
TB gold negative

## 2022-03-11 ENCOUNTER — Other Ambulatory Visit (HOSPITAL_COMMUNITY): Payer: Self-pay

## 2022-03-12 ENCOUNTER — Other Ambulatory Visit (HOSPITAL_COMMUNITY): Payer: Self-pay

## 2022-03-16 ENCOUNTER — Encounter: Payer: Self-pay | Admitting: Family Medicine

## 2022-03-16 ENCOUNTER — Ambulatory Visit (INDEPENDENT_AMBULATORY_CARE_PROVIDER_SITE_OTHER): Payer: Medicare Other | Admitting: Family Medicine

## 2022-03-16 VITALS — BP 122/77 | HR 98 | Temp 97.6°F | Ht 63.0 in | Wt 221.4 lb

## 2022-03-16 DIAGNOSIS — I10 Essential (primary) hypertension: Secondary | ICD-10-CM

## 2022-03-16 DIAGNOSIS — F5101 Primary insomnia: Secondary | ICD-10-CM | POA: Diagnosis not present

## 2022-03-16 DIAGNOSIS — Z0001 Encounter for general adult medical examination with abnormal findings: Secondary | ICD-10-CM

## 2022-03-16 DIAGNOSIS — M85851 Other specified disorders of bone density and structure, right thigh: Secondary | ICD-10-CM | POA: Diagnosis not present

## 2022-03-16 DIAGNOSIS — J301 Allergic rhinitis due to pollen: Secondary | ICD-10-CM | POA: Diagnosis not present

## 2022-03-16 DIAGNOSIS — Z79899 Other long term (current) drug therapy: Secondary | ICD-10-CM

## 2022-03-16 DIAGNOSIS — D84821 Immunodeficiency due to drugs: Secondary | ICD-10-CM | POA: Diagnosis not present

## 2022-03-16 DIAGNOSIS — T7840XA Allergy, unspecified, initial encounter: Secondary | ICD-10-CM

## 2022-03-16 DIAGNOSIS — M0579 Rheumatoid arthritis with rheumatoid factor of multiple sites without organ or systems involvement: Secondary | ICD-10-CM | POA: Diagnosis not present

## 2022-03-16 DIAGNOSIS — E7800 Pure hypercholesterolemia, unspecified: Secondary | ICD-10-CM

## 2022-03-16 DIAGNOSIS — Z0000 Encounter for general adult medical examination without abnormal findings: Secondary | ICD-10-CM

## 2022-03-16 LAB — BAYER DCA HB A1C WAIVED: HB A1C (BAYER DCA - WAIVED): 5.1 % (ref 4.8–5.6)

## 2022-03-16 MED ORDER — BELSOMRA 10 MG PO TABS: 10.0000 mg | ORAL_TABLET | Freq: Every evening | ORAL | 3 refills | Status: DC | PRN

## 2022-03-16 MED ORDER — FLUTICASONE PROPIONATE 50 MCG/ACT NA SUSP: 2.0000 | Freq: Every day | NASAL | 6 refills | Status: DC

## 2022-03-16 NOTE — Progress Notes (Signed)
? ?Jean How[place] is a 66 y.o. female presents to office today for annual physical exam examination.   ? ?Concerns today include: ?1. Insomnia ?Continues to have difficulty with sleep.  Is currently treated with elavil for chronic pain.  Does not cause any drowsiness.  Also treated with gabapentin.  Denies any excessive caffeine intake ? ?2.  Ear fullness ?Patient has tried oral antihistamines but nothing really is helping. ? ?Occupation: Retired. Substance use: None ?Diet: Fair, Exercise: No structured ?Last eye exam: Needs ?Last dental exam: Has dentures that are several years old.  Needs replacement ?Last colonoscopy: Up-to-date until 2030 ?Last mammogram: Up-to-date until July 2024 ?Last pap smear: N/A ?Refills needed today: None ?Immunizations needed: ?Immunization History  ?Administered Date(s) Administered  ? Fluad Quad(high Dose 65+) 09/16/2020  ? Influenza, Quadrivalent, Recombinant, Inj, Pf 09/19/2019  ? PFIZER(Purple Top)SARS-COV-2 Vaccination 05/23/2020, 06/13/2020, 07/31/2020, 02/28/2021  ? ? ? ?Past Medical History:  ?Diagnosis Date  ? Arthritis   ? Depression   ? Hypertension   ? Rheumatoid arthritis ([place])   ? ?Social History  ? ?Socioeconomic History  ? Marital status: Widowed  ?  Spouse name: Not on file  ? Number of children: 1  ? Years of education: Not on file  ? Highest education level: GED or equivalent  ?Occupational History  ? Occupation: retired  ?Tobacco Use  ? Smoking status: Former  ?  Packs/day: 1.00  ?  Years: 15.00  ?  Pack years: 15.00  ?  Types: Cigarettes  ?  Quit date: 2000  ?  Years since quitting: 23.2  ? Smokeless tobacco: Never  ?Vaping Use  ? Vaping Use: Never used  ?Substance and Sexual Activity  ? Alcohol use: Not Currently  ? Drug use: Never  ? Sexual activity: Not Currently  ?  Comment: widowed  ?Other Topics Concern  ? Not on file  ?Social History Narrative  ? Patient is a widow that resides independently in [place].  She has 1 son, who resides in [place].   ? ?Social Determinants of Health  ? ?Financial Resource Strain: Low Risk   ? Difficulty of Paying Living Expenses: Not very hard  ?Food Insecurity: No Food Insecurity  ? Worried About Charity fundraiser in the Last Year: Never true  ? Ran Out of Food in the Last Year: Never true  ?Transportation Needs: No Transportation Needs  ? Lack of Transportation (Medical): No  ? Lack of Transportation (Non-Medical): No  ?Physical Activity: Insufficiently Active  ? Days of Exercise per Week: 7 days  ? Minutes of Exercise per Session: 10 min  ?Stress: No Stress Concern Present  ? Feeling of Stress : Not at all  ?Social Connections: Moderately Integrated  ? Frequency of Communication with Friends and Family: More than three times a week  ? Frequency of Social Gatherings with Friends and Family: Once a week  ? Attends Religious Services: More than 4 times per year  ? Active Member of Clubs or Organizations: Yes  ? Attends Archivist Meetings: More than 4 times per year  ? Marital Status: Never married  ?Intimate Partner Violence: Not At Risk  ? Fear of Current or Ex-Partner: No  ? Emotionally Abused: No  ? Physically Abused: No  ? Sexually Abused: No  ? ?Past Surgical History:  ?Procedure Laterality Date  ? BREAST EXCISIONAL BIOPSY Bilateral   ? pt unsure when- benign  ? BREAST SURGERY    ? nodules removed  ? COLONOSCOPY N/A 06/25/2019  ?  Procedure: COLONOSCOPY;  Surgeon: Danie Binder, MD;  [facility];  Service: Endoscopy;  Laterality: N/A;  1:30  ? POLYPECTOMY  06/25/2019  ? Procedure: POLYPECTOMY;  Surgeon: Danie Binder, MD;  [facility];  Service: Endoscopy;;  ? ?Family History  ?Problem Relation Age of Onset  ? Cancer Father   ? Colon cancer Father   ? Thyroid disease Mother   ? CVA Mother 52  ? Thyroid disease Sister   ? Healthy Son   ? Congestive Heart Failure Sister   ? Hypertension Sister   ? Diabetes Sister   ? Hypertension Sister   ? Diabetes Sister   ? Hypertension Sister   ?  Hypertension Sister   ? Cancer Brother   ?     kidney   ? ? ?Current Outpatient Medications:  ?  amitriptyline (ELAVIL) 25 MG tablet, Take 1 tablet (25 mg total) by mouth at bedtime., Disp: 90 tablet, Rfl: 2 ?  CALCIUM PO, Take by mouth daily., Disp: , Rfl:  ?  Cyanocobalamin (VITAMIN B 12 PO), Take 1 tablet by mouth daily., Disp: , Rfl:  ?  Etanercept (ENBREL MINI) 50 MG/ML SOCT, Inject 50 mg into the skin once a week., Disp: 4 mL, Rfl: 1 ?  gabapentin (NEURONTIN) 400 MG capsule, Take 1 capsule (400 mg total) by mouth at bedtime., Disp: 90 capsule, Rfl: 3 ?  leflunomide (ARAVA) 20 MG tablet, TAKE ONE (1) TABLET EACH DAY, Disp: 90 tablet, Rfl: 0 ?  losartan-hydrochlorothiazide (HYZAAR) 100-12.5 MG tablet, TAKE ONE (1) TABLET EACH DAY, Disp: 90 tablet, Rfl: 2 ?  Multiple Vitamin (MULTIVITAMIN PO), Take 1 tablet by mouth daily., Disp: , Rfl:  ?  VITAMIN D PO, Take 1 tablet by mouth daily., Disp: , Rfl:  ? ?No Known Allergies  ? ?ROS: ?Review of Systems ?A comprehensive review of systems was negative except for: Ears, nose, mouth, throat, and face: positive for ear fullness ?Musculoskeletal: positive for arthralgias, back pain, myalgias, and stiff joints ?Behavioral/Psych: positive for sleep disturbance   ? ?Physical exam ?BP 122/77   Pulse 98   Temp 97.6 ?F (36.4 ?C)   Ht '5\' 3"'$  (1.6 m)   Wt 221 lb 6.4 oz (100.4 kg)   SpO2 98%   BMI 39.22 kg/m?  ?General appearance: alert, cooperative, appears stated age, no distress, and morbidly obese ?Head: Normocephalic, without obvious abnormality, atraumatic ?Eyes: negative findings: lids and lashes normal, conjunctivae and sclerae normal, corneas clear, and pupils equal, round, reactive to light and accomodation ?Ears: normal TM's and external ear canals both ears ?Nose: Nares normal. Septum midline. Mucosa normal. No drainage or sinus tenderness. ?Throat:  Teeth are surgically absent.  She has some mild rubbing and irritation of the right upper gumline.  No ulceration.   Oropharynx without masses or erythema ?Neck: no adenopathy, no carotid bruit, supple, symmetrical, trachea midline, and thyroid not enlarged, symmetric, no tenderness/mass/nodules ?Back: symmetric, no curvature. ROM normal. No CVA tenderness. ?Lungs: clear to auscultation bilaterally ?Heart: regular rate and rhythm, S1, S2 normal, no murmur, click, rub or gallop ?Abdomen:  Obese but soft and nontender ?Extremities: extremities normal, atraumatic, no cyanosis or edema ?Pulses: 2+ and symmetric ?Skin: Skin color, texture, turgor normal. No rashes or lesions ?Lymph nodes: Cervical, supraclavicular, and axillary nodes normal. ?Neurologic: Grossly normal ?Psych: Mood stable, speech normal, affect appropriate ? ?Assessment/ Plan: ?Deborah Lazcano here for annual physical exam.  ? ?Annual physical exam ? ?Immunocompromised state due to drug therapy ([place]) -  Plan: VITAMIN D 25 Hydroxy (Vit-D Deficiency, Fractures), VITAMIN D 25 Hydroxy (Vit-D Deficiency, Fractures) ? ?Rheumatoid arthritis involving multiple sites with positive rheumatoid factor ([place]) - Plan: VITAMIN D 25 Hydroxy (Vit-D Deficiency, Fractures), VITAMIN D 25 Hydroxy (Vit-D Deficiency, Fractures) ? ?Essential hypertension - Plan: Bayer DCA Hb A1c Waived, Lipid panel, Lipid panel, Bayer DCA Hb A1c Waived ? ?Morbid obesity ([place]) - Plan: Bayer DCA Hb A1c Waived, Lipid panel, Lipid panel, Bayer DCA Hb A1c Waived ? ?Osteopenia of neck of right femur - Plan: VITAMIN D 25 Hydroxy (Vit-D Deficiency, Fractures), VITAMIN D 25 Hydroxy (Vit-D Deficiency, Fractures) ? ?Primary insomnia - Plan: Suvorexant (BELSOMRA) 10 MG TABS ? ?Allergy, initial encounter - Plan: fluticasone (FLONASE) 50 MCG/ACT nasal spray ? ?Check vitamin D level given use of immunosuppressants in the setting of RA.  She had CMP, CBC recently obtained by her rheumatologist. ? ?Blood pressures well controlled.  No changes.  Continue Hyzaar ? ?Reduction in weight highly recommended as I am certain length that  this exacerbates her chronic joint pains. ? ?Known osteopenia.  Will be due for DEXA scan in October of this year.  Check vitamin D level ? ?Trial of Belsomra for insomnia.  Discussed possible risks of this medic

## 2022-03-16 NOTE — Patient Instructions (Addendum)
You had labs performed today.  You will be contacted with the results of the labs once they are available, usually in the next 3 business days for routine lab work.  If you have an active my chart account, they will be released to your MyChart.  If you prefer to have these labs released to you via telephone, please let us know. ? ?We are going to hold off on your pneumonia and shingles vaccine for 1-2 weeks since you are doing your Enbrel today. ? ?Belsomra for sleep as needed.  ? ?Flonase for ear fullness/ allergies. ? ? ?Preventive Care 25 Years and Older, Female ?Preventive care refers to lifestyle choices and visits with your health care provider that can promote health and wellness. Preventive care visits are also called wellness exams. ?What can I expect for my preventive care visit? ?Counseling ?Your health care provider may ask you questions about your: ?Medical history, including: ?Past medical problems. ?Family medical history. ?Pregnancy and menstrual history. ?History of falls. ?Current health, including: ?Memory and ability to understand (cognition). ?Emotional well-being. ?Home life and relationship well-being. ?Sexual activity and sexual health. ?Lifestyle, including: ?Alcohol, nicotine or tobacco, and drug use. ?Access to firearms. ?Diet, exercise, and sleep habits. ?Work and work Statistician. ?Sunscreen use. ?Safety issues such as seatbelt and bike helmet use. ?Physical exam ?Your health care provider will check your: ?Height and weight. These may be used to calculate your BMI (body mass index). BMI is a measurement that tells if you are at a healthy weight. ?Waist circumference. This measures the distance around your waistline. This measurement also tells if you are at a healthy weight and may help predict your risk of certain diseases, such as type 2 diabetes and high blood pressure. ?Heart rate and blood pressure. ?Body temperature. ?Skin for abnormal spots. ?What immunizations do I  need? ?Vaccines are usually given at various ages, according to a schedule. Your health care provider will recommend vaccines for you based on your age, medical history, and lifestyle or other factors, such as travel or where you work. ?What tests do I need? ?Screening ?Your health care provider may recommend screening tests for certain conditions. This may include: ?Lipid and cholesterol levels. ?Hepatitis C test. ?Hepatitis B test. ?HIV (human immunodeficiency virus) test. ?STI (sexually transmitted infection) testing, if you are at risk. ?Lung cancer screening. ?Colorectal cancer screening. ?Diabetes screening. This is done by checking your blood sugar (glucose) after you have not eaten for a while (fasting). ?Mammogram. Talk with your health care provider about how often you should have regular mammograms. ?BRCA-related cancer screening. This may be done if you have a family history of breast, ovarian, tubal, or peritoneal cancers. ?Bone density scan. This is done to screen for osteoporosis. ?Talk with your health care provider about your test results, treatment options, and if necessary, the need for more tests. ?Follow these instructions at home: ?Eating and drinking ? ?Eat a diet that includes fresh fruits and vegetables, whole grains, lean protein, and low-fat dairy products. Limit your intake of foods with high amounts of sugar, saturated fats, and salt. ?Take vitamin and mineral supplements as recommended by your health care provider. ?Do not drink alcohol if your health care provider tells you not to drink. ?If you drink alcohol: ?Limit how much you have to 0-1 drink a day. ?Know how much alcohol is in your drink. In the [place] one drink equals one 12 oz bottle of beer (355 mL), one 5 oz glass of wine (148 mL),  or one 1? oz glass of hard liquor (44 mL). ?Lifestyle ?Brush your teeth every morning and night with fluoride toothpaste. Floss one time each day. ?Exercise for at least 30 minutes 5 or more days  each week. ?Do not use any products that contain nicotine or tobacco. These products include cigarettes, chewing tobacco, and vaping devices, such as e-cigarettes. If you need help quitting, ask your health care provider. ?Do not use drugs. ?If you are sexually active, practice safe sex. Use a condom or other form of protection in order to prevent STIs. ?Take aspirin only as told by your health care provider. Make sure that you understand how much to take and what form to take. Work with your health care provider to find out whether it is safe and beneficial for you to take aspirin daily. ?Ask your health care provider if you need to take a cholesterol-lowering medicine (statin). ?Find healthy ways to manage stress, such as: ?Meditation, yoga, or listening to music. ?Journaling. ?Talking to a trusted person. ?Spending time with friends and family. ?Minimize exposure to UV radiation to reduce your risk of skin cancer. ?Safety ?Always wear your seat belt while driving or riding in a vehicle. ?Do not drive: ?If you have been drinking alcohol. Do not ride with someone who has been drinking. ?When you are tired or distracted. ?While texting. ?If you have been using any mind-altering substances or drugs. ?Wear a helmet and other protective equipment during sports activities. ?If you have firearms in your house, make sure you follow all gun safety procedures. ?What's next? ?Visit your health care provider once a year for an annual wellness visit. ?Ask your health care provider how often you should have your eyes and teeth checked. ?Stay up to date on all vaccines. ?This information is not intended to replace advice given to you by your health care provider. Make sure you discuss any questions you have with your health care provider. ?Document Revised: 05/20/2021 Document Reviewed: 05/20/2021 ?Elsevier Patient Education ? 2022 [employer] ? ? ?

## 2022-03-17 ENCOUNTER — Ambulatory Visit (INDEPENDENT_AMBULATORY_CARE_PROVIDER_SITE_OTHER): Payer: Medicare Other

## 2022-03-17 ENCOUNTER — Encounter: Payer: Self-pay | Admitting: Rheumatology

## 2022-03-17 ENCOUNTER — Ambulatory Visit (INDEPENDENT_AMBULATORY_CARE_PROVIDER_SITE_OTHER): Payer: Medicare Other | Admitting: Rheumatology

## 2022-03-17 VITALS — BP 138/79 | HR 93 | Ht 63.0 in | Wt 220.8 lb

## 2022-03-17 DIAGNOSIS — M79672 Pain in left foot: Secondary | ICD-10-CM | POA: Diagnosis not present

## 2022-03-17 DIAGNOSIS — Z800 Family history of malignant neoplasm of digestive organs: Secondary | ICD-10-CM | POA: Diagnosis not present

## 2022-03-17 DIAGNOSIS — M0579 Rheumatoid arthritis with rheumatoid factor of multiple sites without organ or systems involvement: Secondary | ICD-10-CM

## 2022-03-17 DIAGNOSIS — M170 Bilateral primary osteoarthritis of knee: Secondary | ICD-10-CM

## 2022-03-17 DIAGNOSIS — M79642 Pain in left hand: Secondary | ICD-10-CM

## 2022-03-17 DIAGNOSIS — I10 Essential (primary) hypertension: Secondary | ICD-10-CM | POA: Diagnosis not present

## 2022-03-17 DIAGNOSIS — R768 Other specified abnormal immunological findings in serum: Secondary | ICD-10-CM | POA: Diagnosis not present

## 2022-03-17 DIAGNOSIS — Z79899 Other long term (current) drug therapy: Secondary | ICD-10-CM

## 2022-03-17 DIAGNOSIS — Z8349 Family history of other endocrine, nutritional and metabolic diseases: Secondary | ICD-10-CM | POA: Diagnosis not present

## 2022-03-17 DIAGNOSIS — M79671 Pain in right foot: Secondary | ICD-10-CM | POA: Diagnosis not present

## 2022-03-17 DIAGNOSIS — R5383 Other fatigue: Secondary | ICD-10-CM

## 2022-03-17 DIAGNOSIS — M79641 Pain in right hand: Secondary | ICD-10-CM

## 2022-03-17 DIAGNOSIS — Z87891 Personal history of nicotine dependence: Secondary | ICD-10-CM | POA: Diagnosis not present

## 2022-03-17 DIAGNOSIS — Z8659 Personal history of other mental and behavioral disorders: Secondary | ICD-10-CM

## 2022-03-17 DIAGNOSIS — Z6839 Body mass index (BMI) 39.0-39.9, adult: Secondary | ICD-10-CM

## 2022-03-17 LAB — VITAMIN D 25 HYDROXY (VIT D DEFICIENCY, FRACTURES): Vit D, 25-Hydroxy: 98.3 ng/mL (ref 30.0–100.0)

## 2022-03-17 LAB — LIPID PANEL
Chol/HDL Ratio: 3.6 ratio (ref 0.0–4.4)
Cholesterol, Total: 196 mg/dL (ref 100–199)
HDL: 54 mg/dL (ref >39)
LDL Chol Calc (NIH): 121 mg/dL — ABNORMAL HIGH (ref 0–99)
Triglycerides: 119 mg/dL (ref 0–149)
VLDL Cholesterol Cal: 21 mg/dL (ref 5–40)

## 2022-03-17 MED ORDER — ROSUVASTATIN CALCIUM 10 MG PO TABS: 10.0000 mg | ORAL_TABLET | Freq: Every day | ORAL | 3 refills | Status: DC

## 2022-03-17 NOTE — Addendum Note (Signed)
Addended by: [name] on: 03/17/2022 04:17 PM ? ? Modules accepted: Orders ? ?

## 2022-03-17 NOTE — Patient Instructions (Addendum)
Standing Labs ?We placed an order today for your standing lab work.  ? ?Please have your standing labs drawn in June and every 3 months ? ?If possible, please have your labs drawn 2 weeks prior to your appointment so that the provider can discuss your results at your appointment. ? ?Please note that you may see your imaging and lab results in [facility] before we have reviewed them. ?We may be awaiting multiple results to interpret others before contacting you. ?Please allow our office up to 72 hours to thoroughly review all of the results before contacting the office for clarification of your results. ? ?We have open lab daily: ?Monday through Thursday from 1:30-4:30 PM and Friday from 1:30-4:00 PM ?at the office of Dr. Bo Merino, [facility].   ?Please be advised, all patients with office appointments requiring lab work will take precedent over walk-in lab work.  ?If possible, please come for your lab work on Monday and Friday afternoons, as you may experience shorter wait times. ?The office is located at [address], [place], [place], [place] [address] ?No appointment is necessary.   ?Labs are drawn by [facility]. Please bring your co-pay at the time of your lab draw.  You may receive a bill from [facility] for your lab work. ? ?Please note if you are on Hydroxychloroquine and and an order has been placed for a Hydroxychloroquine level, you will need to have it drawn 4 hours or more after your last dose. ? ?If you wish to have your labs drawn at another location, please call the office 24 hours in advance to send orders. ? ?If you have any questions regarding directions or hours of operation,  ?please call [phone].   ?As a reminder, please drink plenty of water prior to coming for your lab work. Thanks!  ? ?Vaccines ?You are taking a medication(s) that can suppress your immune system.  The following immunizations are recommended: ?Flu annually ?Covid-19  ?Td/Tdap (tetanus, diphtheria, pertussis)  every 10 years ?Pneumonia (Prevnar 15 then Pneumovax 23 at least 1 year apart.  Alternatively, can take Prevnar 20 without needing additional dose) ?Shingrix: 2 doses from 4 weeks to 6 months apart ? ?Please check with your PCP to make sure you are up to date.  ? ?If you have signs or symptoms of an infection or start antibiotics: ?First, call your PCP for workup of your infection. ?Hold your medication through the infection, until you complete your antibiotics, and until symptoms resolve if you take the following: ?Injectable medication (Actemra, Benlysta, Cimzia, Cosentyx, Enbrel, Humira, Kevzara, Orencia, Remicade, Simponi, [place], [place], [place]) ?Methotrexate ?Leflunomide Jolee Ewing) ?Mycophenolate (Cellcept) ?Roma Kayser, or Rinvoq  ? ?Please get annual skin examination by dermatologist to screen for skin cancer while you are on Enbrel ? ?Heart Disease Prevention  ? ?Your inflammatory disease increases your risk of heart disease which includes heart attack, stroke, atrial fibrillation (irregular heartbeats), high blood pressure, heart failure and atherosclerosis (plaque in the arteries).  It is important to reduce your risk by:  ? ?Keep blood pressure, cholesterol, and blood sugar at healthy levels  ? ?Smoking Cessation  ? ?Maintain a healthy weight  ?BMI 20-25  ? ?Eat a healthy diet  ?Plenty of fresh fruit, vegetables, and whole grains  ?Limit saturated fats, foods high in sodium, and added sugars  ?DASH and Mediterranean diet  ? ?Increase physical activity  ?Recommend moderate physically activity for 150 minutes per week/ 30 minutes a day for five days a week These  can be broken up into three separate ten-minute sessions during the day.  ? ?Reduce Stress  ?Meditation, slow breathing exercises, yoga, coloring books  ?Dental visits twice a year   ?Knee Exercises ?Ask your health care provider which exercises are safe for you. Do exercises exactly as told by your health care provider and adjust them as  directed. It is normal to feel mild stretching, pulling, tightness, or discomfort as you do these exercises. Stop right away if you feel sudden pain or your pain gets worse. Do not begin these exercises until told by your health care provider. ?Stretching and range-of-motion exercises ?These exercises warm up your muscles and joints and improve the movement and flexibility of your knee. These exercises also help to relieve pain and swelling. ?Knee extension, prone ? ?Lie on your abdomen (prone position) on a bed. ?Place your left / right knee just beyond the edge of the surface so your knee is not on the bed. You can put a towel under your left / right thigh just above your kneecap for comfort. ?Relax your leg muscles and allow gravity to straighten your knee (extension). You should feel a stretch behind your left / right knee. ?Hold this position for __________ seconds. ?Scoot up so your knee is supported between repetitions. ?Repeat __________ times. Complete this exercise __________ times a day. ?Knee flexion, active ? ?Lie on your back with both legs straight. If this causes back discomfort, bend your left / right knee so your foot is flat on the floor. ?Slowly slide your left / right heel back toward your buttocks. Stop when you feel a gentle stretch in the front of your knee or thigh (flexion). ?Hold this position for __________ seconds. ?Slowly slide your left / right heel back to the starting position. ?Repeat __________ times. Complete this exercise __________ times a day. ?Quadriceps stretch, prone ? ?Lie on your abdomen on a firm surface, such as a bed or padded floor. ?Bend your left / right knee and hold your ankle. If you cannot reach your ankle or pant leg, loop a belt around your foot and grab the belt instead. ?Gently pull your heel toward your buttocks. Your knee should not slide out to the side. You should feel a stretch in the front of your thigh and knee (quadriceps). ?Hold this position for  __________ seconds. ?Repeat __________ times. Complete this exercise __________ times a day. ?Hamstring, supine ? ?Lie on your back (supine position). ?Loop a belt or towel over the ball of your left / right foot. The ball of your foot is on the walking surface, right under your toes. ?Straighten your left / right knee and slowly pull on the belt to raise your leg until you feel a gentle stretch behind your knee (hamstring). ?Do not let your knee bend while you do this. ?Keep your other leg flat on the floor. ?Hold this position for __________ seconds. ?Repeat __________ times. Complete this exercise __________ times a day. ?Strengthening exercises ?These exercises build strength and endurance in your knee. Endurance is the ability to use your muscles for a long time, even after they get tired. ?Quadriceps, isometric ?This exercise strengthens the muscles in front of your thigh (quadriceps) without moving your knee joint (isometric). ?Lie on your back with your left / right leg extended and your other knee bent. Put a rolled towel or small pillow under your knee if told by your health care provider. ?Slowly tense the muscles in the front of your left /  right thigh. You should see your kneecap slide up toward your hip or see increased dimpling just above the knee. This motion will push the back of the knee toward the floor. ?For __________ seconds, hold the muscle as tight as you can without increasing your pain. ?Relax the muscles slowly and completely. ?Repeat __________ times. Complete this exercise __________ times a day. ?Straight leg raises ?This exercise strengthens the muscles in front of your thigh (quadriceps) and the muscles that move your hips (hip flexors). ?Lie on your back with your left / right leg extended and your other knee bent. ?Tense the muscles in the front of your left / right thigh. You should see your kneecap slide up or see increased dimpling just above the knee. Your thigh may even shake a  bit. ?Keep these muscles tight as you raise your leg 4-6 inches (10-15 cm) off the floor. Do not let your knee bend. ?Hold this position for __________ seconds. ?Keep these muscles tense as you lower your le

## 2022-03-18 ENCOUNTER — Other Ambulatory Visit (HOSPITAL_COMMUNITY): Payer: Self-pay

## 2022-04-03 ENCOUNTER — Other Ambulatory Visit: Payer: Self-pay | Admitting: Physician Assistant

## 2022-04-05 NOTE — Telephone Encounter (Signed)
Next Visit: 08/17/2022 ? ?Last Visit: 03/17/2022 ? ?Last Fill: 01/01/2022 ? ?DX: Rheumatoid arthritis involving multiple sites with positive rheumatoid factor  ? ?Current Dose per office note 03/17/2022: Arava 20 mg 1 tablet by mouth daily ? ?Labs: 03/01/2022 CBC WNL.  Albumin is borderline low and globulin is borderline elevated but stable.  We will continue to monitor.  Rest of CMP WNL.  ? ?Okay to refill Arava?  ?

## 2022-04-08 ENCOUNTER — Other Ambulatory Visit (HOSPITAL_COMMUNITY): Payer: Self-pay

## 2022-04-08 ENCOUNTER — Other Ambulatory Visit: Payer: Self-pay | Admitting: Physician Assistant

## 2022-04-08 DIAGNOSIS — M0579 Rheumatoid arthritis with rheumatoid factor of multiple sites without organ or systems involvement: Secondary | ICD-10-CM

## 2022-04-08 MED ORDER — ENBREL MINI 50 MG/ML ~~LOC~~ SOCT
50.0000 mg | SUBCUTANEOUS | 2 refills | Status: DC
  Filled 2022-04-08: qty 4, 28d supply, fill #0
  Filled 2022-05-10: qty 4, 28d supply, fill #1
  Filled 2022-06-07: qty 4, 28d supply, fill #2

## 2022-04-08 NOTE — Telephone Encounter (Signed)
Next Visit: 08/17/2022 ? ?Last Visit: 03/17/2022 ? ?Last Fill: 01/22/2022 ? ?FE:[id number] arthritis involving multiple sites with positive rheumatoid factor  ? ?Current Dose per office note 03/17/2022: Enbrel 50 mg subcutaneous injections once weekly ? ?Labs: 03/05/2022, CBC and CMP are stable. ? ?TB Gold: 03/01/2022, negative  ? ?Okay to refill Enbrel? ? ?

## 2022-04-14 ENCOUNTER — Other Ambulatory Visit (HOSPITAL_COMMUNITY): Payer: Self-pay

## 2022-04-26 DIAGNOSIS — D225 Melanocytic nevi of trunk: Secondary | ICD-10-CM | POA: Diagnosis not present

## 2022-04-26 DIAGNOSIS — D485 Neoplasm of uncertain behavior of skin: Secondary | ICD-10-CM | POA: Diagnosis not present

## 2022-04-26 DIAGNOSIS — Z1283 Encounter for screening for malignant neoplasm of skin: Secondary | ICD-10-CM | POA: Diagnosis not present

## 2022-04-26 DIAGNOSIS — D2262 Melanocytic nevi of left upper limb, including shoulder: Secondary | ICD-10-CM | POA: Diagnosis not present

## 2022-05-10 ENCOUNTER — Other Ambulatory Visit (HOSPITAL_COMMUNITY): Payer: Self-pay

## 2022-05-12 ENCOUNTER — Other Ambulatory Visit (HOSPITAL_COMMUNITY): Payer: Self-pay

## 2022-05-20 ENCOUNTER — Other Ambulatory Visit: Payer: Self-pay | Admitting: Family Medicine

## 2022-05-20 DIAGNOSIS — M5432 Sciatica, left side: Secondary | ICD-10-CM

## 2022-05-20 DIAGNOSIS — M792 Neuralgia and neuritis, unspecified: Secondary | ICD-10-CM

## 2022-05-31 ENCOUNTER — Other Ambulatory Visit: Payer: Self-pay | Admitting: *Deleted

## 2022-05-31 ENCOUNTER — Telehealth: Payer: Self-pay | Admitting: Rheumatology

## 2022-05-31 DIAGNOSIS — Z79899 Other long term (current) drug therapy: Secondary | ICD-10-CM | POA: Diagnosis not present

## 2022-05-31 DIAGNOSIS — M25561 Pain in right knee: Secondary | ICD-10-CM

## 2022-05-31 DIAGNOSIS — M170 Bilateral primary osteoarthritis of knee: Secondary | ICD-10-CM

## 2022-06-01 LAB — CBC WITH DIFFERENTIAL/PLATELET
Absolute Monocytes: 339 cells/uL (ref 200–950)
Basophils Absolute: 21 cells/uL (ref 0–200)
Basophils Relative: 0.4 %
Eosinophils Absolute: 228 cells/uL (ref 15–500)
Eosinophils Relative: 4.3 %
HCT: 39.6 % (ref 35.0–45.0)
Hemoglobin: 13 g/dL (ref 11.7–15.5)
Lymphs Abs: 2019 cells/uL (ref 850–3900)
MCH: 29.1 pg (ref 27.0–33.0)
MCHC: 32.8 g/dL (ref 32.0–36.0)
MCV: 88.6 fL (ref 80.0–100.0)
MPV: 10 fL (ref 7.5–12.5)
Monocytes Relative: 6.4 %
Neutro Abs: 2692 cells/uL (ref 1500–7800)
Neutrophils Relative %: 50.8 %
Platelets: 295 10*3/uL (ref 140–400)
RBC: 4.47 10*6/uL (ref 3.80–5.10)
RDW: 14.4 % (ref 11.0–15.0)
Total Lymphocyte: 38.1 %
WBC: 5.3 10*3/uL (ref 3.8–10.8)

## 2022-06-01 LAB — COMPLETE METABOLIC PANEL WITH GFR
AG Ratio: 0.9 (calc) — ABNORMAL LOW (ref 1.0–2.5)
ALT: 17 U/L (ref 6–29)
AST: 17 U/L (ref 10–35)
Albumin: 3.3 g/dL — ABNORMAL LOW (ref 3.6–5.1)
Alkaline phosphatase (APISO): 71 U/L (ref 37–153)
BUN/Creatinine Ratio: 16 (calc) (ref 6–22)
BUN: 19 mg/dL (ref 7–25)
CO2: 28 mmol/L (ref 20–32)
Calcium: 9.8 mg/dL (ref 8.6–10.4)
Chloride: 102 mmol/L (ref 98–110)
Creat: 1.18 mg/dL — ABNORMAL HIGH (ref 0.50–1.05)
Globulin: 3.8 g/dL (calc) — ABNORMAL HIGH (ref 1.9–3.7)
Glucose, Bld: 103 mg/dL — ABNORMAL HIGH (ref 65–99)
Potassium: 3.9 mmol/L (ref 3.5–5.3)
Sodium: 140 mmol/L (ref 135–146)
Total Bilirubin: 0.3 mg/dL (ref 0.2–1.2)
Total Protein: 7.1 g/dL (ref 6.1–8.1)
eGFR: 51 mL/min/{1.73_m2} — ABNORMAL LOW (ref >=60)

## 2022-06-03 ENCOUNTER — Ambulatory Visit: Payer: Medicare Other | Admitting: Orthopaedic Surgery

## 2022-06-07 ENCOUNTER — Other Ambulatory Visit (HOSPITAL_COMMUNITY): Payer: Self-pay

## 2022-06-10 ENCOUNTER — Other Ambulatory Visit (HOSPITAL_COMMUNITY): Payer: Self-pay

## 2022-06-10 ENCOUNTER — Ambulatory Visit (INDEPENDENT_AMBULATORY_CARE_PROVIDER_SITE_OTHER): Payer: Medicare Other | Admitting: Orthopaedic Surgery

## 2022-06-10 ENCOUNTER — Ambulatory Visit (INDEPENDENT_AMBULATORY_CARE_PROVIDER_SITE_OTHER): Payer: Medicare Other

## 2022-06-10 ENCOUNTER — Encounter: Payer: Self-pay | Admitting: Orthopaedic Surgery

## 2022-06-10 ENCOUNTER — Ambulatory Visit: Payer: Self-pay

## 2022-06-10 VITALS — Ht 64.5 in | Wt 222.0 lb

## 2022-06-10 DIAGNOSIS — M1711 Unilateral primary osteoarthritis, right knee: Secondary | ICD-10-CM | POA: Insufficient documentation

## 2022-06-10 DIAGNOSIS — M25561 Pain in right knee: Secondary | ICD-10-CM

## 2022-06-10 DIAGNOSIS — G8929 Other chronic pain: Secondary | ICD-10-CM | POA: Diagnosis not present

## 2022-06-10 NOTE — Progress Notes (Signed)
Office Visit Note   Patient: Jean Howell           Date of Birth: 05-26-1956           MRN: [id number] Visit Date: 06/10/2022              Requested by: [name], DO [place],  [place] [address] PCP: [name], DO   Assessment & Plan: Visit Diagnoses:  1. Primary osteoarthritis of right knee     Plan: Impression is advanced tricompartmental right knee DJD.  Conservative managements of now ceased to provide relief.  Based on her treatment options she has elected for right total knee replacement.  She understands that Enbrel will have to be stopped at least 2 weeks before and 2 weeks after surgery to mitigate risk of infection.  She recently saw PCP in April for annual physical and had a good checkup.  She met with Jackelyn Poling today to schedule surgery.  Risk benefits recovery prognosis reviewed with the patient in detail.  Questions encouraged and answered.  Denies history of DVT or nickel allergy.  Follow-Up Instructions: No follow-ups on file.   Orders:  Orders Placed This Encounter  Procedures   XR KNEE 3 VIEW RIGHT   No orders of the defined types were placed in this encounter.     Procedures: No procedures performed   Clinical Data: No additional findings.   Subjective: Chief Complaint  Patient presents with   Right Knee - Pain    HPI Jean Howell is referral from Dr. Osvaldo Howell for chronic severe right knee pain of 3 years.  Has pain throughout the knee worse with activity.  Endorses swelling.  Denies previous trauma or surgery.  Has had prior cortisone injections with minimal relief.  Has rheumatoid arthritis currently takes Enbrel.  The pain is severely interfering with ADLs and quality of life.  Review of Systems  Constitutional: Negative.   HENT: Negative.    Eyes: Negative.   Respiratory: Negative.    Cardiovascular: Negative.   Endocrine: Negative.   Musculoskeletal: Negative.   Neurological: Negative.   Hematological:  Negative.   Psychiatric/Behavioral: Negative.    All other systems reviewed and are negative.    Objective: Vital Signs: Ht 5' 4.5" (1.638 m)   Wt 222 lb (100.7 kg)   BMI 37.52 kg/m   Physical Exam Vitals and nursing note reviewed.  Constitutional:      Appearance: She is well-developed.  HENT:     Head: Normocephalic and atraumatic.     Nose: Nose normal.  Eyes:     Extraocular Movements: Extraocular movements intact.  Cardiovascular:     Pulses: Normal pulses.  Pulmonary:     Effort: Pulmonary effort is normal.  Abdominal:     Palpations: Abdomen is soft.  Musculoskeletal:     Cervical back: Neck supple.  Skin:    General: Skin is warm.     Capillary Refill: Capillary refill takes less than 2 seconds.  Neurological:     Mental Status: She is alert and oriented to person, place, and time. Mental status is at baseline.  Psychiatric:        Behavior: Behavior normal.        Thought Content: Thought content normal.        Judgment: Judgment normal.     Ortho Exam Examination of right knee shows a trace effusion.  Pain and crepitus throughout range of motion which is mildly restricted.  Collaterals and  cruciates are stable.  Medial and lateral joint line tenderness.  Specialty Comments:  No specialty comments available.  Imaging: XR KNEE 3 VIEW RIGHT  Result Date: 06/10/2022 Advanced tricompartmental degenerative joint disease.  Bone-on-bone joint space narrowing.    PMFS History: Patient Active Problem List   Diagnosis Date Noted   Primary osteoarthritis of right knee 06/10/2022   Osteopenia of neck of right femur 03/16/2022   Full code status 06/16/2020   Impaired mobility and endurance 06/16/2020   Sciatica of left side 06/16/2020   Rheumatoid arthritis involving multiple sites with positive rheumatoid factor ([place]) 03/06/2019   Primary osteoarthritis of both knees 03/06/2019   Former smoker 03/06/2019   ANA positive 02/12/2019   Family history of  thyroid disease 01/08/2019   Morbid obesity ([place]) 01/08/2019   Essential hypertension 01/08/2019   Family history of colon cancer 01/08/2019   Past Medical History:  Diagnosis Date   Arthritis    Depression    Hypertension    Rheumatoid arthritis ([place])     Family History  Problem Relation Age of Onset   Cancer Father    Colon cancer Father    Thyroid disease Mother    CVA Mother 34   Thyroid disease Sister    Healthy Son    Congestive Heart Failure Sister    Hypertension Sister    Diabetes Sister    Hypertension Sister    Diabetes Sister    Hypertension Sister    Hypertension Sister    Cancer Brother        kidney     Past Surgical History:  Procedure Laterality Date   BREAST EXCISIONAL BIOPSY Bilateral    pt unsure when- benign   BREAST SURGERY     nodules removed   COLONOSCOPY N/A 06/25/2019   Procedure: COLONOSCOPY;  Surgeon: Danie Binder, MD;  Location: [facility];  Service: Endoscopy;  Laterality: N/A;  1:30   MOLE REMOVAL Right 01/26/2022   foot (patient was put to sleep for procedure)   POLYPECTOMY  06/25/2019   Procedure: POLYPECTOMY;  Surgeon: Danie Binder, MD;  Location: [facility];  Service: Endoscopy;;   Social History   Occupational History   Occupation: retired  Tobacco Use   Smoking status: Former    Packs/day: 1.00    Years: 15.00    Total pack years: 15.00    Types: Cigarettes    Quit date: 2000    Years since quitting: 23.5    Passive exposure: Never   Smokeless tobacco: Never  Vaping Use   Vaping Use: Never used  Substance and Sexual Activity   Alcohol use: Not Currently   Drug use: Never   Sexual activity: Not Currently    Comment: widowed

## 2022-06-14 ENCOUNTER — Other Ambulatory Visit: Payer: Self-pay

## 2022-06-15 ENCOUNTER — Telehealth: Payer: Self-pay | Admitting: Orthopaedic Surgery

## 2022-06-15 NOTE — Telephone Encounter (Signed)
Pt called and was wondering about her being set up for physical therapy?

## 2022-06-15 NOTE — Telephone Encounter (Signed)
Yes she will be for outpatient for 2 weeks after surgery

## 2022-06-18 ENCOUNTER — Ambulatory Visit (INDEPENDENT_AMBULATORY_CARE_PROVIDER_SITE_OTHER): Payer: Medicare Other

## 2022-06-18 VITALS — Wt 222.0 lb

## 2022-06-18 DIAGNOSIS — Z0000 Encounter for general adult medical examination without abnormal findings: Secondary | ICD-10-CM

## 2022-06-18 NOTE — Patient Instructions (Signed)
[name] , Thank you for taking time to come for your Medicare Wellness Visit. I appreciate your ongoing commitment to your health goals. Please review the following plan we discussed and let me know if I can assist you in the future.   Screening recommendations/referrals: Colonoscopy: Done 06/25/2019 - Repeat in 5-10 years Mammogram: Done 07/01/2021 - Repeat annually *schedule after you recover from knee surgery Bone Density: Done 09/16/2020 - Repeat every 2 years Recommended yearly ophthalmology/optometry visit for glaucoma screening and checkup Recommended yearly dental visit for hygiene and checkup  Vaccinations: declined in the past, but would like to get caught up next visit Influenza vaccine: recommend every Fall Pneumococcal vaccine: recommend once per lifetime Prevnar-20 Tdap vaccine: recommend every 10 years Shingles vaccine: recommend Shingrix which is 2 doses 2-6 months apart and over 90% effective     Covid-19: Done 05/23/2020, 06/13/2020, 07/31/2020, 02/28/2021  Advanced directives: Please bring a copy of your health care power of attorney and living will to the office to be added to your chart at your convenience.   Conditions/risks identified: Once your knee is better - Aim for 30 minutes of exercise or brisk walking, 6-8 glasses of water, and 5 servings of fruits and vegetables each day.   Next appointment: Follow up in one year for your annual wellness visit    Preventive Care 65 Years and Older, Female Preventive care refers to lifestyle choices and visits with your health care provider that can promote health and wellness. What does preventive care include? A yearly physical exam. This is also called an annual well check. Dental exams once or twice a year. Routine eye exams. Ask your health care provider how often you should have your eyes checked. Personal lifestyle choices, including: Daily care of your teeth and gums. Regular physical activity. Eating a healthy  diet. Avoiding tobacco and drug use. Limiting alcohol use. Practicing safe sex. Taking low-dose aspirin every day. Taking vitamin and mineral supplements as recommended by your health care provider. What happens during an annual well check? The services and screenings done by your health care provider during your annual well check will depend on your age, overall health, lifestyle risk factors, and family history of disease. Counseling  Your health care provider may ask you questions about your: Alcohol use. Tobacco use. Drug use. Emotional well-being. Home and relationship well-being. Sexual activity. Eating habits. History of falls. Memory and ability to understand (cognition). Work and work Statistician. Reproductive health. Screening  You may have the following tests or measurements: Height, weight, and BMI. Blood pressure. Lipid and cholesterol levels. These may be checked every 5 years, or more frequently if you are over 36 years old. Skin check. Lung cancer screening. You may have this screening every year starting at age 84 if you have a 30-pack-year history of smoking and currently smoke or have quit within the past 15 years. Fecal occult blood test (FOBT) of the stool. You may have this test every year starting at age 59. Flexible sigmoidoscopy or colonoscopy. You may have a sigmoidoscopy every 5 years or a colonoscopy every 10 years starting at age 67. Hepatitis C blood test. Hepatitis B blood test. Sexually transmitted disease (STD) testing. Diabetes screening. This is done by checking your blood sugar (glucose) after you have not eaten for a while (fasting). You may have this done every 1-3 years. Bone density scan. This is done to screen for osteoporosis. You may have this done starting at age 30. Mammogram. This may be  done every 1-2 years. Talk to your health care provider about how often you should have regular mammograms. Talk with your health care provider about  your test results, treatment options, and if necessary, the need for more tests. Vaccines  Your health care provider may recommend certain vaccines, such as: Influenza vaccine. This is recommended every year. Tetanus, diphtheria, and acellular pertussis (Tdap, Td) vaccine. You may need a Td booster every 10 years. Zoster vaccine. You may need this after age 17. Pneumococcal 13-valent conjugate (PCV13) vaccine. One dose is recommended after age 9. Pneumococcal polysaccharide (PPSV23) vaccine. One dose is recommended after age 81. Talk to your health care provider about which screenings and vaccines you need and how often you need them. This information is not intended to replace advice given to you by your health care provider. Make sure you discuss any questions you have with your health care provider. Document Released: 12/19/2015 Document Revised: 08/11/2016 Document Reviewed: 09/23/2015 Elsevier Interactive Patient Education  2017 [employer] Prevention in the Home Falls can cause injuries. They can happen to people of all ages. There are many things you can do to make your home safe and to help prevent falls. What can I do on the outside of my home? Regularly fix the edges of walkways and driveways and fix any cracks. Remove anything that might make you trip as you walk through a door, such as a raised step or threshold. Trim any bushes or trees on the path to your home. Use bright outdoor lighting. Clear any walking paths of anything that might make someone trip, such as rocks or tools. Regularly check to see if handrails are loose or broken. Make sure that both sides of any steps have handrails. Any raised decks and porches should have guardrails on the edges. Have any leaves, snow, or ice cleared regularly. Use sand or salt on walking paths during winter. Clean up any spills in your garage right away. This includes oil or grease spills. What can I do in the bathroom? Use  night lights. Install grab bars by the toilet and in the tub and shower. Do not use towel bars as grab bars. Use non-skid mats or decals in the tub or shower. If you need to sit down in the shower, use a plastic, non-slip stool. Keep the floor dry. Clean up any water that spills on the floor as soon as it happens. Remove soap buildup in the tub or shower regularly. Attach bath mats securely with double-sided non-slip rug tape. Do not have throw rugs and other things on the floor that can make you trip. What can I do in the bedroom? Use night lights. Make sure that you have a light by your bed that is easy to reach. Do not use any sheets or blankets that are too big for your bed. They should not hang down onto the floor. Have a firm chair that has side arms. You can use this for support while you get dressed. Do not have throw rugs and other things on the floor that can make you trip. What can I do in the kitchen? Clean up any spills right away. Avoid walking on wet floors. Keep items that you use a lot in easy-to-reach places. If you need to reach something above you, use a strong step stool that has a grab bar. Keep electrical cords out of the way. Do not use floor polish or wax that makes floors slippery. If you must use wax,  use non-skid floor wax. Do not have throw rugs and other things on the floor that can make you trip. What can I do with my stairs? Do not leave any items on the stairs. Make sure that there are handrails on both sides of the stairs and use them. Fix handrails that are broken or loose. Make sure that handrails are as long as the stairways. Check any carpeting to make sure that it is firmly attached to the stairs. Fix any carpet that is loose or worn. Avoid having throw rugs at the top or bottom of the stairs. If you do have throw rugs, attach them to the floor with carpet tape. Make sure that you have a light switch at the top of the stairs and the bottom of the  stairs. If you do not have them, ask someone to add them for you. What else can I do to help prevent falls? Wear shoes that: Do not have high heels. Have rubber bottoms. Are comfortable and fit you well. Are closed at the toe. Do not wear sandals. If you use a stepladder: Make sure that it is fully opened. Do not climb a closed stepladder. Make sure that both sides of the stepladder are locked into place. Ask someone to hold it for you, if possible. Clearly mark and make sure that you can see: Any grab bars or handrails. First and last steps. Where the edge of each step is. Use tools that help you move around (mobility aids) if they are needed. These include: Canes. Walkers. Scooters. Crutches. Turn on the lights when you go into a dark area. Replace any light bulbs as soon as they burn out. Set up your furniture so you have a clear path. Avoid moving your furniture around. If any of your floors are uneven, fix them. If there are any pets around you, be aware of where they are. Review your medicines with your doctor. Some medicines can make you feel dizzy. This can increase your chance of falling. Ask your doctor what other things that you can do to help prevent falls. This information is not intended to replace advice given to you by your health care provider. Make sure you discuss any questions you have with your health care provider. Document Released: 09/18/2009 Document Revised: 04/29/2016 Document Reviewed: 12/27/2014 Elsevier Interactive Patient Education  2017 [employer].

## 2022-06-18 NOTE — Progress Notes (Signed)
Subjective:   Jean Howell is a 66 y.o. female who presents for Medicare Annual (Subsequent) preventive examination. Virtual Visit via Telephone Note  I connected with  Jean Howell on 06/18/22 at  2:45 PM EDT by telephone and verified that I am speaking with the correct person using two identifiers.  Location: Patient: Home Provider: [facility] Persons participating in the virtual visit: patient/Nurse Health Advisor   I discussed the limitations, risks, security and privacy concerns of performing an evaluation and management service by telephone and the availability of in person appointments. The patient expressed understanding and agreed to proceed.  Interactive audio and video telecommunications were attempted between this nurse and patient, however failed, due to patient having technical difficulties OR patient did not have access to video capability.  We continued and completed visit with audio only.  Some vital signs may be absent or patient reported.   Carlyon Nolasco E Kaislee Chao, LPN   Review of Systems     Cardiac Risk Factors include: advanced age (>32mn, >>55women);hypertension;obesity (BMI >30kg/m2);sedentary lifestyle;smoking/ tobacco exposure     Objective:    Today's Vitals   06/18/22 1451  Weight: 222 lb (100.7 kg)  PainSc: 7    Body mass index is 37.52 kg/m.     06/18/2022    2:59 PM 09/21/2021   12:22 PM 06/17/2021    3:36 PM 06/16/2020    3:21 PM 10/24/2019    7:06 PM 06/25/2019   11:18 AM  Advanced Directives  Does Patient Have a Medical Advance Directive? Yes No No No No No  Type of AParamedicof ASumnerLiving will       Copy of [place]onin Chart? No - copy requested       Would patient like information on creating a medical advance directive?   No - Patient declined No - Patient declined  No - Patient declined    Current Medications (verified) Outpatient Encounter Medications as of 06/18/2022  Medication Sig    amitriptyline (ELAVIL) 25 MG tablet Take 1 tablet (25 mg total) by mouth at bedtime.   CALCIUM PO Take by mouth daily.   Cyanocobalamin (VITAMIN B 12 PO) Take 1 tablet by mouth daily.   Etanercept (ENBREL MINI) 50 MG/ML SOCT Inject 50 mg into the skin once a week.   fluticasone (FLONASE) 50 MCG/ACT nasal spray Place 2 sprays into both nostrils daily.   gabapentin (NEURONTIN) 400 MG capsule TAKE ONE CAPSULE BY MOUTH AT BEDTIME   leflunomide (ARAVA) 20 MG tablet TAKE ONE (1) TABLET EACH DAY   losartan-hydrochlorothiazide (HYZAAR) 100-12.5 MG tablet TAKE ONE (1) TABLET EACH DAY   Multiple Vitamin (MULTIVITAMIN PO) Take 1 tablet by mouth daily.   rosuvastatin (CRESTOR) 10 MG tablet Take 1 tablet (10 mg total) by mouth daily.   Suvorexant (BELSOMRA) 10 MG TABS Take 10 mg by mouth at bedtime as needed (sleep).   VITAMIN D PO Take 1 tablet by mouth daily.   No facility-administered encounter medications on file as of 06/18/2022.    Allergies (verified) Patient has no known allergies.   History: Past Medical History:  Diagnosis Date   Arthritis    Depression    Hypertension    Rheumatoid arthritis ([place]    Past Surgical History:  Procedure Laterality Date   BREAST EXCISIONAL BIOPSY Bilateral    pt unsure when- benign   BREAST SURGERY     nodules removed   COLONOSCOPY N/A 06/25/2019   Procedure: COLONOSCOPY;  Surgeon:  Fields, Marga Melnick, MD;  [facility];  Service: Endoscopy;  Laterality: N/A;  1:30   MOLE REMOVAL Right 01/26/2022   foot (patient was put to sleep for procedure)   POLYPECTOMY  06/25/2019   Procedure: POLYPECTOMY;  Surgeon: Danie Binder, MD;  [facility]: [facility];  Service: Endoscopy;;   Family History  Problem Relation Age of Onset   Cancer Father    Colon cancer Father    Thyroid disease Mother    CVA Mother 35   Thyroid disease Sister    Healthy Son    Congestive Heart Failure Sister    Hypertension Sister    Diabetes Sister    Hypertension  Sister    Diabetes Sister    Hypertension Sister    Hypertension Sister    Cancer Brother        kidney    Social History   Socioeconomic History   Marital status: Widowed    Spouse name: Not on file   Number of children: 1   Years of education: Not on file   Highest education level: GED or equivalent  Occupational History   Occupation: retired  Tobacco Use   Smoking status: Former    Packs/day: 1.00    Years: 15.00    Total pack years: 15.00    Types: Cigarettes    Quit date: 2000    Years since quitting: 23.5    Passive exposure: Never   Smokeless tobacco: Never  Vaping Use   Vaping Use: Never used  Substance and Sexual Activity   Alcohol use: Not Currently   Drug use: Never   Sexual activity: Not Currently    Comment: widowed  Other Topics Concern   Not on file  Social History Narrative   Patient is a widow that resides independently in [place].  She has 1 son, who resides in [place].   Social Determinants of Health   Financial Resource Strain: Low Risk  (06/18/2022)   Overall Financial Resource Strain (CARDIA)    Difficulty of Paying Living Expenses: Not hard at all  Food Insecurity: No Food Insecurity (06/18/2022)   Hunger Vital Sign    Worried About Running Out of Food in the Last Year: Never true    Ran Out of Food in the Last Year: Never true  Transportation Needs: No Transportation Needs (06/18/2022)   PRAPARE - Hydrologist (Medical): No    Lack of Transportation (Non-Medical): No  Physical Activity: Inactive (06/18/2022)   Exercise Vital Sign    Days of Exercise per Week: 0 days    Minutes of Exercise per Session: 0 min  Stress: No Stress Concern Present (06/18/2022)   [place]    Feeling of Stress : Not at all  Social Connections: Moderately Integrated (06/18/2022)   Social Connection and Isolation Panel [NHANES]    Frequency of Communication with  Friends and Family: More than three times a week    Frequency of Social Gatherings with Friends and Family: Twice a week    Attends Religious Services: More than 4 times per year    Active Member of [employer] or Organizations: Yes    Attends Music therapist: More than 4 times per year    Marital Status: Never married    Tobacco Counseling Counseling given: Not Answered   Clinical Intake:  Pre-visit preparation completed: Yes  Pain : 0-10 Pain Score: 7  Pain Type:  Chronic pain Pain Location: Knee Pain Orientation: Right Pain Descriptors / Indicators: Aching, Sharp, Tender Pain Onset: More than a month ago Pain Frequency: Intermittent     BMI - recorded: 37.52 Nutritional Status: BMI > 30  Obese Nutritional Risks: None Diabetes: No  How often do you need to have someone help you when you read instructions, pamphlets, or other written materials from your doctor or pharmacy?: 1 - Never  Diabetic? no  Interpreter Needed?: No  Information entered by :: Quan Cybulski, LPN   Activities of Daily Living    06/18/2022    2:59 PM  In your present state of health, do you have any difficulty performing the following activities:  Hearing? 0  Vision? 0  Difficulty concentrating or making decisions? 0  Walking or climbing stairs? 1  Dressing or bathing? 0  Doing errands, shopping? 1  Comment doesn't have a reliable vehicle - uses public transportation or family  Conservation officer, nature and eating ? N  Using the Toilet? N  In the past six months, have you accidently leaked urine? Y  Do you have problems with loss of bowel control? N  Managing your Medications? N  Managing your Finances? N  Housekeeping or managing your Housekeeping? N    Patient Care Team: [name], DO as PCP - General (Family Medicine) Gala Romney Cristopher Estimable, MD as Consulting Physician (Gastroenterology) Bo Merino, MD as Consulting Physician (Rheumatology) Allyn Kenner, MD (Dermatology) Harlen Labs, MD as Referring Physician (Optometry) Leandrew Koyanagi, MD as Attending Physician (Orthopedic Surgery)  Indicate any recent Medical Services you may have received from other than [facility] providers in the past year (date may be approximate).     Assessment:   This is a routine wellness examination for Jean Howell.  Hearing/Vision screen Hearing Screening - Comments:: Denies hearing difficulties   Vision Screening - Comments:: Wears rx glasses - behind with routine eye exams with [facility]  Dietary issues and exercise activities discussed: Current Exercise Habits: The patient does not participate in regular exercise at present, Exercise limited by: orthopedic condition(s)   Goals Addressed             This Visit's Progress    Prevent falls   Not on track    Get to walking better, get knee pain under control Be more independent       Depression Screen    06/18/2022    2:57 PM 03/16/2022    9:15 AM 06/17/2021    3:28 PM 03/13/2021    9:11 AM 09/16/2020   10:22 AM 06/16/2020    3:22 PM 05/14/2019    3:50 PM  PHQ 2/9 Scores  PHQ - 2 Score 1 2 0 0  4 0  PHQ- 9 Score '5 10    7 '$ 0  Exception Documentation     Other- indicate reason in comment box      Fall Risk    06/18/2022    2:52 PM 03/16/2022    9:15 AM 06/17/2021    3:36 PM 03/13/2021    9:10 AM 09/16/2020    9:40 AM  [place] in the past year? 1 0 0 1 0  Number falls in past yr: 0  0 0   Injury with Fall? 0  0 0   Risk for fall due to : Impaired balance/gait;Orthopedic patient;History of fall(s)  Impaired balance/gait;Orthopedic patient;Impaired vision Impaired mobility   Follow up Falls prevention discussed  Falls prevention  discussed Falls evaluation completed     FALL RISK PREVENTION PERTAINING TO THE HOME:  Any stairs in or around the home? Yes  If so, are there any without handrails? No  Home free of loose throw rugs in walkways, pet beds, electrical cords, etc? Yes  Adequate lighting  in your home to reduce risk of falls? Yes   ASSISTIVE DEVICES UTILIZED TO PREVENT FALLS:  Life alert? No  Use of a cane, walker or w/c? Yes  Grab bars in the bathroom? Yes  Shower chair or bench in shower? Yes  Elevated toilet seat or a handicapped toilet? Yes   TIMED UP AND GO:  Was the test performed? No . Telephonic visit  Cognitive Function:    06/16/2020    3:27 PM  MMSE - Mini Mental State Exam  Orientation to time 5  Orientation to Place 5  Registration 3  Attention/ Calculation 5  Recall 3  Language- name 2 objects 2  Language- repeat 1  Language- follow 3 step command 3  Language- read & follow direction 1  Write a sentence 1  Copy design 1  Total score 30        06/18/2022    3:00 PM  6CIT Screen  What Year? 0 points  What month? 0 points  What time? 0 points  Count back from 20 0 points  Months in reverse 0 points  Repeat phrase 2 points  Total Score 2 points    Immunizations Immunization History  Administered Date(s) Administered   Fluad Quad(high Dose 65+) 09/16/2020   Influenza, Quadrivalent, Recombinant, Inj, Pf 09/19/2019   PFIZER(Purple Top)SARS-COV-2 Vaccination 05/23/2020, 06/13/2020, 07/31/2020, 02/28/2021    TDAP status: Due, Education has been provided regarding the importance of this vaccine. Advised may receive this vaccine at local pharmacy or Health Dept. Aware to provide a copy of the vaccination record if obtained from local pharmacy or Health Dept. Verbalized acceptance and understanding.  Flu Vaccine status: Declined, Education has been provided regarding the importance of this vaccine but patient still declined. Advised may receive this vaccine at local pharmacy or Health Dept. Aware to provide a copy of the vaccination record if obtained from local pharmacy or Health Dept. Verbalized acceptance and understanding.  Pneumococcal vaccine status: Declined,  Education has been provided regarding the importance of this vaccine but  patient still declined. Advised may receive this vaccine at local pharmacy or Health Dept. Aware to provide a copy of the vaccination record if obtained from local pharmacy or Health Dept. Verbalized acceptance and understanding.   Covid-19 vaccine status: Completed vaccines  Qualifies for Shingles Vaccine? Yes   Zostavax completed No   Shingrix Completed?: No.    Education has been provided regarding the importance of this vaccine. Patient has been advised to call insurance company to determine out of pocket expense if they have not yet received this vaccine. Advised may also receive vaccine at local pharmacy or Health Dept. Verbalized acceptance and understanding.  Screening Tests Health Maintenance  Topic Date Due   Zoster Vaccines- Shingrix (1 of 2) Never done   Pneumonia Vaccine 70+ Years old (1 - PCV) Never done   COVID-19 Vaccine (5 - Booster for Pfizer series) 04/25/2021   TETANUS/TDAP  09/15/2022 (Originally 07/12/1975)   INFLUENZA VACCINE  07/06/2022   DEXA SCAN  09/16/2022   MAMMOGRAM  07/02/2023   COLONOSCOPY (Pts 45-37yr Insurance coverage will need to be confirmed)  06/24/2029   Hepatitis C Screening  Completed   HPV  VACCINES  Aged Out    Health Maintenance  Health Maintenance Due  Topic Date Due   Zoster Vaccines- Shingrix (1 of 2) Never done   Pneumonia Vaccine 72+ Years old (1 - PCV) Never done   COVID-19 Vaccine (5 - Booster for Pfizer series) 04/25/2021    Colorectal cancer screening: Type of screening: Colonoscopy. Completed 06/25/2019. Repeat every 5-10 years  Mammogram status: Completed 07/01/2021. Repeat every year  Bone Density status: Completed 10/12/221. Results reflect: Bone density results: OSTEOPENIA. Repeat every 2 years.  Lung Cancer Screening: (Low Dose CT Chest recommended if Age 56-80 years, 30 pack-year currently smoking OR have quit w/in 15years.) does not qualify  Additional Screening:  Hepatitis C Screening: does qualify; Completed  01/08/2019  Vision Screening: Recommended annual ophthalmology exams for early detection of glaucoma and other disorders of the eye. Is the patient up to date with their annual eye exam?  No  Who is the provider or what is the name of the office in which the patient attends annual eye exams? [facility] If pt is not established with a provider, would they like to be referred to a provider to establish care? No .   Dental Screening: Recommended annual dental exams for proper oral hygiene  Community Resource Referral / Chronic Care Management: CRR required this visit?  No   CCM required this visit?  No      Plan:     I have personally reviewed and noted the following in the patient's chart:   Medical and social history Use of alcohol, tobacco or illicit drugs  Current medications and supplements including opioid prescriptions.  Functional ability and status Nutritional status Physical activity Advanced directives List of other physicians Hospitalizations, surgeries, and ER visits in previous 12 months Vitals Screenings to include cognitive, depression, and falls Referrals and appointments  In addition, I have reviewed and discussed with patient certain preventive protocols, quality metrics, and best practice recommendations. A written personalized care plan for preventive services as well as general preventive health recommendations were provided to patient.     Sandrea Hammond, LPN   1/44/3154   Nurse Notes: None

## 2022-06-21 ENCOUNTER — Telehealth: Payer: Self-pay | Admitting: Orthopaedic Surgery

## 2022-06-21 NOTE — Telephone Encounter (Signed)
Sure she should stop that as well.  Thanks.

## 2022-06-21 NOTE — Telephone Encounter (Signed)
Patient called wanting to know when she would be talking to a Education officer, museum about staying at a rehab facility after right total knee surgery with Dr. Erlinda Hong.  She states she has nobody at home to help her and would like to know where she is going in advance.  Patient did mention having two cats at home.  She said someone told her she would be talking with someone about these arrangements to stay in a facility in advance of her knee surgery.  Cb# [phone]

## 2022-06-21 NOTE — Telephone Encounter (Signed)
Called and notified patient.

## 2022-06-21 NOTE — Telephone Encounter (Signed)
Patient called to ask about her Rx Leflunomide (ARAVA) prescribed by Dr Estanislado Pandy.  She has already stopped the [place], but nothing was said about [place].  Patient states when she had foot surgery and was asked to stop both.  Please advise  [phone].

## 2022-06-25 NOTE — Pre-Procedure Instructions (Signed)
Surgical Instructions    Your procedure is scheduled on Monday, July 31st.  Report to [facility] Main Entrance "A" at 7:15 A.M., then check in with the Admitting office.  Call this number if you have problems the morning of surgery:  [phone]   If you have any questions prior to your surgery date call [phone]: Open Monday-Friday 8am-4pm    Remember:  Do not eat after midnight the night before your surgery  You may drink clear liquids until 6:15 a.m. the morning of your surgery.   Clear liquids allowed are: Water, Non-Citrus Juices (without pulp), Carbonated Beverages, Clear Tea, Black Coffee Only (NO MILK, CREAM OR POWDERED CREAMER of any kind), and Gatorade.   Enhanced Recovery after Surgery for Orthopedics Enhanced Recovery after Surgery is a protocol used to improve the stress on your body and your recovery after surgery.  Patient Instructions  The day of surgery (if you do NOT have diabetes):  Drink ONE (1) Pre-Surgery Clear Ensure by 6:15 am the morning of surgery   This drink was given to you during your hospital  pre-op appointment visit. Nothing else to drink after completing the  Pre-Surgery Clear Ensure.         If you have questions, please contact your surgeon's office.     Take these medicines the morning of surgery with A SIP OF WATER  rosuvastatin (CRESTOR)  fluticasone (FLONASE)-if needed  Please reach out to your surgeon about the medication leflunomide (ARAVA). It may be that your surgeon needs you to stop this medication prior to surgery.  As of today, STOP taking any Aspirin (unless otherwise instructed by your surgeon) Aleve, Naproxen, Ibuprofen, Motrin, Advil, Goody's, BC's, all herbal medications, fish oil, and all vitamins.                     Do NOT Smoke (Tobacco/Vaping) for 24 hours prior to your procedure.  If you use a CPAP at night, you may bring your mask/headgear for your overnight stay.   Contacts, glasses, piercing's, hearing  aid's, dentures or partials may not be worn into surgery, please bring cases for these belongings.    For patients admitted to the hospital, discharge time will be determined by your treatment team.   Patients discharged the day of surgery will not be allowed to drive home, and someone needs to stay with them for 24 hours.  SURGICAL WAITING ROOM VISITATION Patients having surgery or a procedure may have no more than 2 support people in the waiting area - these visitors may rotate.   Children under the age of 11 must have an adult with them who is not the patient. If the patient needs to stay at the hospital during part of their recovery, the visitor guidelines for inpatient rooms apply. Pre-op nurse will coordinate an appropriate time for 1 support person to accompany patient in pre-op.  This support person may not rotate.   Please refer to the [facility] website for the visitor guidelines for Inpatients (after your surgery is over and you are in a regular room).    Special instructions:   [facility]- Preparing For Surgery  Before surgery, you can play an important role. Because skin is not sterile, your skin needs to be as free of germs as possible. You can reduce the number of germs on your skin by washing with CHG (chlorahexidine gluconate) Soap before surgery.  CHG is an antiseptic cleaner which kills germs and bonds with the skin to  continue killing germs even after washing.    Oral Hygiene is also important to reduce your risk of infection.  Remember - BRUSH YOUR TEETH THE MORNING OF SURGERY WITH YOUR REGULAR TOOTHPASTE  Please do not use if you have an allergy to CHG or antibacterial soaps. If your skin becomes reddened/irritated stop using the CHG.  Do not shave (including legs and underarms) for at least 48 hours prior to first CHG shower. It is OK to shave your face.  Please follow these instructions carefully.   Shower the NIGHT BEFORE SURGERY and the MORNING OF SURGERY  If  you chose to wash your hair, wash your hair first as usual with your normal shampoo.  After you shampoo, rinse your hair and body thoroughly to remove the shampoo.  Use CHG Soap as you would any other liquid soap. You can apply CHG directly to the skin and wash gently with a scrungie or a clean washcloth.   Apply the CHG Soap to your body ONLY FROM THE NECK DOWN.  Do not use on open wounds or open sores. Avoid contact with your eyes, ears, mouth and genitals (private parts). Wash Face and genitals (private parts)  with your normal soap.   Wash thoroughly, paying special attention to the area where your surgery will be performed.  Thoroughly rinse your body with warm water from the neck down.  DO NOT shower/wash with your normal soap after using and rinsing off the CHG Soap.  Pat yourself dry with a CLEAN TOWEL.  Wear CLEAN PAJAMAS to bed the night before surgery  Place CLEAN SHEETS on your bed the night before your surgery  DO NOT SLEEP WITH PETS.   Day of Surgery: Take a shower with CHG soap. Do not wear jewelry or makeup Do not wear lotions, powders, perfumes, or deodorant. Do not shave 48 hours prior to surgery.   Do not bring valuables to the hospital.  [facility] is not responsible for any belongings or valuables. Do not wear nail polish, gel polish, artificial nails, or any other type of covering on natural nails (fingers and toes) If you have artificial nails or gel coating that need to be removed by a nail salon, please have this removed prior to surgery. Artificial nails or gel coating may interfere with anesthesia's ability to adequately monitor your vital signs. Wear Clean/Comfortable clothing the morning of surgery Remember to brush your teeth WITH YOUR REGULAR TOOTHPASTE.   Please read over the following fact sheets that you were given.    If you received a COVID test during your pre-op visit  it is requested that you wear a mask when out in public, stay away from  anyone that may not be feeling well and notify your surgeon if you develop symptoms. If you have been in contact with anyone that has tested positive in the last 10 days please notify you surgeon.

## 2022-06-28 ENCOUNTER — Other Ambulatory Visit: Payer: Self-pay | Admitting: Physician Assistant

## 2022-06-28 ENCOUNTER — Inpatient Hospital Stay (HOSPITAL_COMMUNITY)
Admission: RE | Admit: 2022-06-28 | Discharge: 2022-06-28 | Disposition: A | Discharge status: Home / Self Care | Payer: Medicare Other | Source: Ambulatory Visit

## 2022-06-28 MED ORDER — OXYCODONE-ACETAMINOPHEN 5-325 MG PO TABS: 1.0000 | ORAL_TABLET | Freq: Four times a day (QID) | ORAL | 0 refills | Status: DC | PRN

## 2022-06-28 MED ORDER — ASPIRIN 81 MG PO TBEC: 81.0000 mg | DELAYED_RELEASE_TABLET | Freq: Two times a day (BID) | ORAL | 0 refills | Status: DC

## 2022-06-28 MED ORDER — ONDANSETRON HCL 4 MG PO TABS: 4.0000 mg | ORAL_TABLET | Freq: Three times a day (TID) | ORAL | 0 refills | Status: DC | PRN

## 2022-06-28 MED ORDER — DOCUSATE SODIUM 100 MG PO CAPS: 100.0000 mg | ORAL_CAPSULE | Freq: Every day | ORAL | 2 refills | Status: AC | PRN

## 2022-06-28 MED ORDER — METHOCARBAMOL 750 MG PO TABS: 750.0000 mg | ORAL_TABLET | Freq: Two times a day (BID) | ORAL | 2 refills | Status: DC | PRN

## 2022-06-29 ENCOUNTER — Other Ambulatory Visit (HOSPITAL_COMMUNITY): Payer: Self-pay

## 2022-07-01 ENCOUNTER — Other Ambulatory Visit (HOSPITAL_COMMUNITY): Payer: Self-pay

## 2022-07-02 ENCOUNTER — Encounter (HOSPITAL_COMMUNITY)
Admission: RE | Admit: 2022-07-02 | Discharge: 2022-07-02 | Disposition: A | Discharge status: Home / Self Care | Payer: Medicare Other | Source: Ambulatory Visit | Attending: Orthopaedic Surgery | Admitting: Orthopaedic Surgery

## 2022-07-02 ENCOUNTER — Other Ambulatory Visit: Payer: Self-pay

## 2022-07-02 ENCOUNTER — Encounter (HOSPITAL_COMMUNITY): Payer: Self-pay

## 2022-07-02 ENCOUNTER — Telehealth: Payer: Self-pay | Admitting: *Deleted

## 2022-07-02 VITALS — BP 134/70 | HR 98 | Temp 98.2°F | Resp 17 | Ht 64.0 in | Wt 218.8 lb

## 2022-07-02 DIAGNOSIS — Z01818 Encounter for other preprocedural examination: Secondary | ICD-10-CM | POA: Insufficient documentation

## 2022-07-02 DIAGNOSIS — M1711 Unilateral primary osteoarthritis, right knee: Secondary | ICD-10-CM | POA: Insufficient documentation

## 2022-07-02 LAB — SURGICAL PCR SCREEN
MRSA, PCR: NEGATIVE
Staphylococcus aureus: NEGATIVE

## 2022-07-02 LAB — COMPREHENSIVE METABOLIC PANEL
ALT: 17 U/L (ref 0–44)
AST: 25 U/L (ref 15–41)
Albumin: 3.2 g/dL — ABNORMAL LOW (ref 3.5–5.0)
Alkaline Phosphatase: 58 U/L (ref 38–126)
Anion gap: 7 (ref 5–15)
BUN: 14 mg/dL (ref 8–23)
CO2: 28 mmol/L (ref 22–32)
Calcium: 9.4 mg/dL (ref 8.9–10.3)
Chloride: 103 mmol/L (ref 98–111)
Creatinine, Ser: 1.17 mg/dL — ABNORMAL HIGH (ref 0.44–1.00)
GFR, Estimated: 51 mL/min — ABNORMAL LOW (ref >60)
Glucose, Bld: 85 mg/dL (ref 70–99)
Potassium: 4.2 mmol/L (ref 3.5–5.1)
Sodium: 138 mmol/L (ref 135–145)
Total Bilirubin: 0.4 mg/dL (ref 0.3–1.2)
Total Protein: 8.1 g/dL (ref 6.5–8.1)

## 2022-07-02 LAB — CBC
HCT: 38.5 % (ref 36.0–46.0)
Hemoglobin: 12.6 g/dL (ref 12.0–15.0)
MCH: 29 pg (ref 26.0–34.0)
MCHC: 32.7 g/dL (ref 30.0–36.0)
MCV: 88.7 fL (ref 80.0–100.0)
Platelets: 288 10*3/uL (ref 150–400)
RBC: 4.34 MIL/uL (ref 3.87–5.11)
RDW: 14.8 % (ref 11.5–15.5)
WBC: 7.4 10*3/uL (ref 4.0–10.5)
nRBC: 0 % (ref 0.0–0.2)

## 2022-07-02 MED ORDER — TRANEXAMIC ACID 1000 MG/10ML IV SOLN
2000.0000 mg | INTRAVENOUS | Status: DC
  Filled 2022-07-02: qty 20

## 2022-07-02 NOTE — Pre-Procedure Instructions (Signed)
Surgical Instructions                 Your procedure is scheduled on Monday, July 31st.             Report to [facility] Main Entrance "A" at 6:15 A.M., then check in with the Admitting office.             Call this number if you have problems the morning of surgery:             [phone]    If you have any questions prior to your surgery date call [phone]: Open Monday-Friday 8am-4pm                 Remember:             Do not eat after midnight the night before your surgery   You may drink clear liquids until 5:45 a.m. the morning of your surgery.   Clear liquids allowed are: Water, Non-Citrus Juices (without pulp), Carbonated Beverages, Clear Tea, Black Coffee Only (NO MILK, CREAM OR POWDERED CREAMER of any kind), and Gatorade.     Enhanced Recovery after Surgery for Orthopedics Enhanced Recovery after Surgery is a protocol used to improve the stress on your body and your recovery after surgery.   Patient Instructions  The day of surgery (if you do NOT have diabetes):  Drink ONE (1) Pre-Surgery Clear Ensure by 5:45 am the morning of surgery   This drink was given to you during your hospital  pre-op appointment visit. Nothing else to drink after completing the  Pre-Surgery Clear Ensure.          If you have questions, please contact your surgeon's office.                            Take these medicines the morning of surgery with A SIP OF WATER  rosuvastatin (CRESTOR)  fluticasone (FLONASE)-if needed   Please reach out to your surgeon about the medication leflunomide (ARAVA). It may be that your surgeon needs you to stop this medication prior to surgery.   As of today, STOP taking any Aspirin (unless otherwise instructed by your surgeon) Aleve, Naproxen, Ibuprofen, Motrin, Advil, Goody's, BC's, all herbal medications, fish oil, and all vitamins.                     Do NOT Smoke (Tobacco/Vaping) for 24 hours prior to your procedure.   If you use a CPAP at night,  you may bring your mask/headgear for your overnight stay.   Contacts, glasses, piercing's, hearing aid's, dentures or partials may not be worn into surgery, please bring cases for these belongings.    For patients admitted to the hospital, discharge time will be determined by your treatment team.   Patients discharged the day of surgery will not be allowed to drive home, and someone needs to stay with them for 24 hours.   SURGICAL WAITING ROOM VISITATION Patients having surgery or a procedure may have no more than 2 support people in the waiting area - these visitors may rotate.   Children under the age of 74 must have an adult with them who is not the patient. If the patient needs to stay at the hospital during part of their recovery, the visitor guidelines for inpatient rooms apply. Pre-op nurse will coordinate an appropriate time for 1 support person to accompany patient in pre-op.  This support person may not rotate.    Please refer to the [facility] website for the visitor guidelines for Inpatients (after your surgery is over and you are in a regular room).      Special instructions:   [facility]- Preparing For Surgery   Before surgery, you can play an important role. Because skin is not sterile, your skin needs to be as free of germs as possible. You can reduce the number of germs on your skin by washing with CHG (chlorahexidine gluconate) Soap before surgery.  CHG is an antiseptic cleaner which kills germs and bonds with the skin to continue killing germs even after washing.     Oral Hygiene is also important to reduce your risk of infection.  Remember - BRUSH YOUR TEETH THE MORNING OF SURGERY WITH YOUR REGULAR TOOTHPASTE   Please do not use if you have an allergy to CHG or antibacterial soaps. If your skin becomes reddened/irritated stop using the CHG.  Do not shave (including legs and underarms) for at least 48 hours prior to first CHG shower. It is OK to shave your face.    Please follow these instructions carefully.                                                                                                                               Shower the NIGHT BEFORE SURGERY and the MORNING OF SURGERY   If you chose to wash your hair, wash your hair first as usual with your normal shampoo.   After you shampoo, rinse your hair and body thoroughly to remove the shampoo.   Use CHG Soap as you would any other liquid soap. You can apply CHG directly to the skin and wash gently with a scrungie or a clean washcloth.    Apply the CHG Soap to your body ONLY FROM THE NECK DOWN.  Do not use on open wounds or open sores. Avoid contact with your eyes, ears, mouth and genitals (private parts). Wash Face and genitals (private parts)  with your normal soap.    Wash thoroughly, paying special attention to the area where your surgery will be performed.   Thoroughly rinse your body with warm water from the neck down.   DO NOT shower/wash with your normal soap after using and rinsing off the CHG Soap.   Pat yourself dry with a CLEAN TOWEL.   Wear CLEAN PAJAMAS to bed the night before surgery   Place CLEAN SHEETS on your bed the night before your surgery   DO NOT SLEEP WITH PETS.     Day of Surgery: Take a shower with CHG soap. Do not wear jewelry or makeup Do not wear lotions, powders, perfumes, or deodorant. Do not shave 48 hours prior to surgery.   Do not bring valuables to the hospital.  [facility] is not responsible for any belongings or valuables. Do not wear nail polish, gel polish, artificial nails, or any  other type of covering on natural nails (fingers and toes) If you have artificial nails or gel coating that need to be removed by a nail salon, please have this removed prior to surgery. Artificial nails or gel coating may interfere with anesthesia's ability to adequately monitor your vital signs. Wear Clean/Comfortable clothing the morning of surgery Remember  to brush your teeth WITH YOUR REGULAR TOOTHPASTE.   Please read over the following fact sheets that you were given.       If you received a COVID test during your pre-op visit  it is requested that you wear a mask when out in public, stay away from anyone that may not be feeling well and notify your surgeon if you develop symptoms. If you have been in contact with anyone that has tested positive in the last 10 days please notify you surgeon.

## 2022-07-02 NOTE — Progress Notes (Addendum)
PCP - Ronnie Doss DO Cardiologist - denies Rheumatologist: Bo Merino MD  PPM/ICD - denies Device Orders -  Rep Notified -   Chest x-ray -  EKG - 07/02/22 Stress Test - none ECHO - none Cardiac Cath - none  Sleep Study - denies CPAP - no  Fasting blood sugar- na Checks Blood Sugar _____ times a day  Blood Thinner Instructions:na Aspirin Instructions:na  ERAS Protcol - clear liquids until 0545 PRE-SURGERY Ensure or G2-   COVID TEST- na   Anesthesia review: no  Patient denies shortness of breath, fever, cough and chest pain at PAT appointment   All instructions explained to the patient, with a verbal understanding of the material. Patient agrees to go over the instructions while at home for a better understanding. Patient also instructed to wear a mask when out in public prior to surgery. The opportunity to ask questions was provided.

## 2022-07-02 NOTE — Telephone Encounter (Signed)
Attempted Ortho bundle call to patient prior to surgery. No answer and unable to leave VM due to full mailbox. Sent email to address on file requesting patient call RNCM.

## 2022-07-04 ENCOUNTER — Telehealth: Payer: Self-pay | Admitting: *Deleted

## 2022-07-04 NOTE — Telephone Encounter (Signed)
Ortho bundle pre-op call completed. 

## 2022-07-04 NOTE — Care Plan (Signed)
[facility] RNCM call to patient and discussed her upcoming Right total knee arthroplasty with Dr. Erlinda Hong on 07/05/22. She is an Ortho bundle patient through [facility] and is agreeable to case management. She lives alone and states she has no family or friends that can check on her or that can assist after surgery. She has a niece in the [place] area that would be able to come by from time to time due to work, but cannot provide care. She anticipates going to a SNF for STR at discharge. Dr. Erlinda Hong is aware. She mentioned wanting to go to a facility in [place] or [place] if possible. Discussed [facility] or [facility]. She was provided a home CPM and RW by [employer] prior to surgery. Reviewed post op care instructions.  Will continue to follow for needs.

## 2022-07-05 ENCOUNTER — Other Ambulatory Visit: Payer: Self-pay

## 2022-07-05 ENCOUNTER — Other Ambulatory Visit (HOSPITAL_COMMUNITY): Payer: Self-pay

## 2022-07-05 ENCOUNTER — Ambulatory Visit (HOSPITAL_COMMUNITY): Payer: Medicare Other

## 2022-07-05 ENCOUNTER — Encounter (HOSPITAL_COMMUNITY)
Admission: RE | Disposition: A | Discharge status: Skilled Nursing Facility | Payer: Self-pay | Source: Home / Self Care | Attending: Orthopaedic Surgery

## 2022-07-05 ENCOUNTER — Other Ambulatory Visit: Payer: Self-pay | Admitting: Rheumatology

## 2022-07-05 ENCOUNTER — Encounter (HOSPITAL_COMMUNITY): Payer: Self-pay | Admitting: Orthopaedic Surgery

## 2022-07-05 ENCOUNTER — Inpatient Hospital Stay (HOSPITAL_COMMUNITY)
Admission: RE | Admit: 2022-07-05 | Discharge: 2022-07-10 | DRG: 470 | Disposition: A | Discharge status: Skilled Nursing Facility | Payer: Medicare Other | Attending: Orthopaedic Surgery | Admitting: Orthopaedic Surgery

## 2022-07-05 ENCOUNTER — Ambulatory Visit (HOSPITAL_BASED_OUTPATIENT_CLINIC_OR_DEPARTMENT_OTHER): Payer: Medicare Other | Admitting: Certified Registered"

## 2022-07-05 ENCOUNTER — Ambulatory Visit (HOSPITAL_COMMUNITY): Payer: Medicare Other | Admitting: Certified Registered"

## 2022-07-05 DIAGNOSIS — M069 Rheumatoid arthritis, unspecified: Secondary | ICD-10-CM | POA: Diagnosis not present

## 2022-07-05 DIAGNOSIS — Z7982 Long term (current) use of aspirin: Secondary | ICD-10-CM

## 2022-07-05 DIAGNOSIS — G8918 Other acute postprocedural pain: Secondary | ICD-10-CM | POA: Diagnosis not present

## 2022-07-05 DIAGNOSIS — M1711 Unilateral primary osteoarthritis, right knee: Principal | ICD-10-CM | POA: Diagnosis present

## 2022-07-05 DIAGNOSIS — Z96651 Presence of right artificial knee joint: Secondary | ICD-10-CM

## 2022-07-05 DIAGNOSIS — Z6837 Body mass index (BMI) 37.0-37.9, adult: Secondary | ICD-10-CM

## 2022-07-05 DIAGNOSIS — I10 Essential (primary) hypertension: Secondary | ICD-10-CM | POA: Diagnosis present

## 2022-07-05 DIAGNOSIS — Z23 Encounter for immunization: Secondary | ICD-10-CM

## 2022-07-05 DIAGNOSIS — D62 Acute posthemorrhagic anemia: Secondary | ICD-10-CM | POA: Diagnosis not present

## 2022-07-05 DIAGNOSIS — Z79899 Other long term (current) drug therapy: Secondary | ICD-10-CM | POA: Diagnosis not present

## 2022-07-05 DIAGNOSIS — M0579 Rheumatoid arthritis with rheumatoid factor of multiple sites without organ or systems involvement: Secondary | ICD-10-CM

## 2022-07-05 DIAGNOSIS — Z87891 Personal history of nicotine dependence: Secondary | ICD-10-CM | POA: Diagnosis not present

## 2022-07-05 DIAGNOSIS — Z8249 Family history of ischemic heart disease and other diseases of the circulatory system: Secondary | ICD-10-CM | POA: Diagnosis not present

## 2022-07-05 DIAGNOSIS — E669 Obesity, unspecified: Secondary | ICD-10-CM | POA: Diagnosis present

## 2022-07-05 HISTORY — PX: TOTAL KNEE ARTHROPLASTY: SHX125

## 2022-07-05 SURGERY — ARTHROPLASTY, KNEE, TOTAL
Anesthesia: General | Site: Knee | Laterality: Right

## 2022-07-05 MED ORDER — PHENOL 1.4 % MT LIQD: 1.0000 | OROMUCOSAL | Status: DC | PRN

## 2022-07-05 MED ORDER — PROPOFOL 10 MG/ML IV BOLUS
INTRAVENOUS | Status: AC
  Filled 2022-07-05: qty 20

## 2022-07-05 MED ORDER — MENTHOL 3 MG MT LOZG
1.0000 | LOZENGE | OROMUCOSAL | Status: DC | PRN
Start: 2022-07-05 — End: 2022-07-10

## 2022-07-05 MED ORDER — FENTANYL CITRATE (PF) 100 MCG/2ML IJ SOLN
INTRAMUSCULAR | Status: AC
  Filled 2022-07-05: qty 2

## 2022-07-05 MED ORDER — ONDANSETRON HCL 4 MG/2ML IJ SOLN
INTRAMUSCULAR | Status: AC
  Filled 2022-07-05: qty 4

## 2022-07-05 MED ORDER — TRANEXAMIC ACID-NACL 1000-0.7 MG/100ML-% IV SOLN
INTRAVENOUS | Status: AC
  Filled 2022-07-05: qty 100

## 2022-07-05 MED ORDER — TRANEXAMIC ACID-NACL 1000-0.7 MG/100ML-% IV SOLN
1000.0000 mg | INTRAVENOUS | Status: AC
  Administered 2022-07-05: 1000 mg via INTRAVENOUS

## 2022-07-05 MED ORDER — PHENYLEPHRINE HCL-NACL 20-0.9 MG/250ML-% IV SOLN: INTRAVENOUS | Status: DC | PRN

## 2022-07-05 MED ORDER — PROPOFOL 10 MG/ML IV BOLUS
INTRAVENOUS | Status: DC | PRN
  Administered 2022-07-05: 100 mg via INTRAVENOUS
  Administered 2022-07-05 (×2): 20 mg via INTRAVENOUS

## 2022-07-05 MED ORDER — SODIUM CHLORIDE 0.9 % IV SOLN: INTRAVENOUS | Status: DC

## 2022-07-05 MED ORDER — CEFAZOLIN SODIUM-DEXTROSE 2-4 GM/100ML-% IV SOLN
2.0000 g | Freq: Four times a day (QID) | INTRAVENOUS | Status: AC
  Administered 2022-07-05 (×2): 2 g via INTRAVENOUS
  Filled 2022-07-05 (×2): qty 100

## 2022-07-05 MED ORDER — ORAL CARE MOUTH RINSE: 15.0000 mL | Freq: Once | OROMUCOSAL | Status: AC

## 2022-07-05 MED ORDER — FENTANYL CITRATE (PF) 100 MCG/2ML IJ SOLN: 50.0000 ug | Freq: Once | INTRAMUSCULAR | Status: AC

## 2022-07-05 MED ORDER — ASPIRIN 81 MG PO CHEW
81.0000 mg | CHEWABLE_TABLET | Freq: Two times a day (BID) | ORAL | Status: DC
  Administered 2022-07-05 – 2022-07-09 (×9): 81 mg via ORAL
  Filled 2022-07-05 (×10): qty 1

## 2022-07-05 MED ORDER — LACTATED RINGERS IV SOLN: INTRAVENOUS | Status: DC

## 2022-07-05 MED ORDER — CHLORHEXIDINE GLUCONATE 0.12 % MT SOLN
OROMUCOSAL | Status: AC
  Administered 2022-07-05: 15 mL via OROMUCOSAL
  Filled 2022-07-05: qty 15

## 2022-07-05 MED ORDER — OXYCODONE HCL 5 MG PO TABS: 5.0000 mg | ORAL_TABLET | Freq: Once | ORAL | Status: DC | PRN

## 2022-07-05 MED ORDER — FENTANYL CITRATE (PF) 100 MCG/2ML IJ SOLN
25.0000 ug | INTRAMUSCULAR | Status: DC | PRN
  Administered 2022-07-05: 50 ug via INTRAVENOUS

## 2022-07-05 MED ORDER — TRANEXAMIC ACID 1000 MG/10ML IV SOLN
INTRAVENOUS | Status: DC | PRN
  Administered 2022-07-05: 2000 mg via TOPICAL

## 2022-07-05 MED ORDER — ONDANSETRON HCL 4 MG/2ML IJ SOLN
4.0000 mg | Freq: Four times a day (QID) | INTRAMUSCULAR | Status: DC | PRN
  Administered 2022-07-05 – 2022-07-06 (×3): 4 mg via INTRAVENOUS
  Filled 2022-07-05 (×4): qty 2

## 2022-07-05 MED ORDER — PROPOFOL 500 MG/50ML IV EMUL
INTRAVENOUS | Status: DC | PRN
  Administered 2022-07-05: 50 ug/kg/min via INTRAVENOUS

## 2022-07-05 MED ORDER — ACETAMINOPHEN 500 MG PO TABS
1000.0000 mg | ORAL_TABLET | Freq: Four times a day (QID) | ORAL | Status: AC
  Administered 2022-07-05 – 2022-07-06 (×4): 1000 mg via ORAL
  Filled 2022-07-05 (×4): qty 2

## 2022-07-05 MED ORDER — DEXAMETHASONE SODIUM PHOSPHATE 10 MG/ML IJ SOLN
10.0000 mg | Freq: Once | INTRAMUSCULAR | Status: AC
  Administered 2022-07-06: 10 mg via INTRAVENOUS
  Filled 2022-07-05: qty 1

## 2022-07-05 MED ORDER — CHLORHEXIDINE GLUCONATE 0.12 % MT SOLN: 15.0000 mL | Freq: Once | OROMUCOSAL | Status: AC

## 2022-07-05 MED ORDER — HYDROMORPHONE HCL 1 MG/ML IJ SOLN
0.5000 mg | INTRAMUSCULAR | Status: DC | PRN
  Administered 2022-07-07 – 2022-07-08 (×2): 1 mg via INTRAVENOUS
  Filled 2022-07-05 (×2): qty 1

## 2022-07-05 MED ORDER — KETOROLAC TROMETHAMINE 30 MG/ML IJ SOLN
INTRAMUSCULAR | Status: DC | PRN
  Administered 2022-07-05: 30 mg via INTRAVENOUS

## 2022-07-05 MED ORDER — METHOCARBAMOL 500 MG PO TABS
500.0000 mg | ORAL_TABLET | Freq: Four times a day (QID) | ORAL | Status: DC | PRN
  Administered 2022-07-05 – 2022-07-09 (×4): 500 mg via ORAL
  Filled 2022-07-05 (×4): qty 1

## 2022-07-05 MED ORDER — GABAPENTIN 400 MG PO CAPS
400.0000 mg | ORAL_CAPSULE | Freq: Every day | ORAL | Status: DC
Start: 2022-07-05 — End: 2022-07-10
  Administered 2022-07-05 – 2022-07-09 (×5): 400 mg via ORAL
  Filled 2022-07-05 (×5): qty 1

## 2022-07-05 MED ORDER — POVIDONE-IODINE 10 % EX SWAB
2.0000 | Freq: Once | CUTANEOUS | Status: AC
  Administered 2022-07-05: 2 via TOPICAL

## 2022-07-05 MED ORDER — TRANEXAMIC ACID-NACL 1000-0.7 MG/100ML-% IV SOLN
1000.0000 mg | Freq: Once | INTRAVENOUS | Status: AC
  Administered 2022-07-05: 1000 mg via INTRAVENOUS
  Filled 2022-07-05: qty 100

## 2022-07-05 MED ORDER — AMISULPRIDE (ANTIEMETIC) 5 MG/2ML IV SOLN: 10.0000 mg | Freq: Once | INTRAVENOUS | Status: DC | PRN

## 2022-07-05 MED ORDER — PRONTOSAN WOUND IRRIGATION OPTIME
TOPICAL | Status: DC | PRN
  Administered 2022-07-05: 1 via TOPICAL

## 2022-07-05 MED ORDER — ONDANSETRON HCL 4 MG PO TABS: 4.0000 mg | ORAL_TABLET | Freq: Four times a day (QID) | ORAL | Status: DC | PRN

## 2022-07-05 MED ORDER — HYDROMORPHONE HCL 1 MG/ML IJ SOLN
INTRAMUSCULAR | Status: AC
  Filled 2022-07-05: qty 0.5

## 2022-07-05 MED ORDER — OXYCODONE HCL 5 MG/5ML PO SOLN: 5.0000 mg | Freq: Once | ORAL | Status: DC | PRN

## 2022-07-05 MED ORDER — ACETAMINOPHEN 325 MG PO TABS
325.0000 mg | ORAL_TABLET | Freq: Four times a day (QID) | ORAL | Status: DC | PRN
  Administered 2022-07-06 – 2022-07-09 (×2): 650 mg via ORAL
  Filled 2022-07-05 (×2): qty 2

## 2022-07-05 MED ORDER — CEFAZOLIN SODIUM-DEXTROSE 2-4 GM/100ML-% IV SOLN
INTRAVENOUS | Status: AC
  Filled 2022-07-05: qty 100

## 2022-07-05 MED ORDER — METOCLOPRAMIDE HCL 5 MG PO TABS
5.0000 mg | ORAL_TABLET | Freq: Three times a day (TID) | ORAL | Status: DC | PRN
  Administered 2022-07-07: 10 mg via ORAL
  Filled 2022-07-05: qty 2

## 2022-07-05 MED ORDER — BUPIVACAINE IN DEXTROSE 0.75-8.25 % IT SOLN
INTRATHECAL | Status: DC | PRN
  Administered 2022-07-05: 2 mL via INTRATHECAL

## 2022-07-05 MED ORDER — PNEUMOCOCCAL 20-VAL CONJ VACC 0.5 ML IM SUSY
0.5000 mL | PREFILLED_SYRINGE | INTRAMUSCULAR | Status: AC
  Administered 2022-07-07: 0.5 mL via INTRAMUSCULAR
  Filled 2022-07-05: qty 0.5

## 2022-07-05 MED ORDER — FENTANYL CITRATE (PF) 100 MCG/2ML IJ SOLN
INTRAMUSCULAR | Status: AC
  Administered 2022-07-05: 50 ug via INTRAVENOUS
  Filled 2022-07-05: qty 2

## 2022-07-05 MED ORDER — HYDROMORPHONE HCL 1 MG/ML IJ SOLN
INTRAMUSCULAR | Status: DC | PRN
  Administered 2022-07-05: .5 mg via INTRAVENOUS

## 2022-07-05 MED ORDER — MIDAZOLAM HCL 2 MG/2ML IJ SOLN: 2.0000 mg | Freq: Once | INTRAMUSCULAR | Status: AC

## 2022-07-05 MED ORDER — KETOROLAC TROMETHAMINE 15 MG/ML IJ SOLN
15.0000 mg | Freq: Four times a day (QID) | INTRAMUSCULAR | Status: AC
  Administered 2022-07-05 – 2022-07-06 (×4): 15 mg via INTRAVENOUS
  Filled 2022-07-05 (×4): qty 1

## 2022-07-05 MED ORDER — OXYCODONE HCL ER 10 MG PO T12A
10.0000 mg | EXTENDED_RELEASE_TABLET | Freq: Two times a day (BID) | ORAL | Status: DC
  Administered 2022-07-05 – 2022-07-09 (×10): 10 mg via ORAL
  Filled 2022-07-05 (×10): qty 1

## 2022-07-05 MED ORDER — ONDANSETRON HCL 4 MG/2ML IJ SOLN
INTRAMUSCULAR | Status: DC | PRN
  Administered 2022-07-05: 4 mg via INTRAVENOUS

## 2022-07-05 MED ORDER — METOCLOPRAMIDE HCL 5 MG/ML IJ SOLN
5.0000 mg | Freq: Three times a day (TID) | INTRAMUSCULAR | Status: DC | PRN
  Administered 2022-07-05 – 2022-07-06 (×2): 10 mg via INTRAVENOUS
  Filled 2022-07-05 (×2): qty 2

## 2022-07-05 MED ORDER — BUPIVACAINE-MELOXICAM ER 400-12 MG/14ML IJ SOLN
INTRAMUSCULAR | Status: DC | PRN
  Administered 2022-07-05: 400 mg

## 2022-07-05 MED ORDER — DOCUSATE SODIUM 100 MG PO CAPS
100.0000 mg | ORAL_CAPSULE | Freq: Two times a day (BID) | ORAL | Status: DC
  Administered 2022-07-05 – 2022-07-09 (×9): 100 mg via ORAL
  Filled 2022-07-05 (×9): qty 1

## 2022-07-05 MED ORDER — VANCOMYCIN HCL 1000 MG IV SOLR
INTRAVENOUS | Status: AC
  Filled 2022-07-05: qty 20

## 2022-07-05 MED ORDER — PROPOFOL 1000 MG/100ML IV EMUL
INTRAVENOUS | Status: AC
  Filled 2022-07-05: qty 100

## 2022-07-05 MED ORDER — AMITRIPTYLINE HCL 50 MG PO TABS
25.0000 mg | ORAL_TABLET | Freq: Every day | ORAL | Status: DC
  Administered 2022-07-05 – 2022-07-09 (×5): 25 mg via ORAL
  Filled 2022-07-05 (×5): qty 1

## 2022-07-05 MED ORDER — FERROUS SULFATE 325 (65 FE) MG PO TABS
325.0000 mg | ORAL_TABLET | Freq: Three times a day (TID) | ORAL | Status: DC
  Administered 2022-07-05 – 2022-07-09 (×14): 325 mg via ORAL
  Filled 2022-07-05 (×14): qty 1

## 2022-07-05 MED ORDER — OXYCODONE HCL 5 MG PO TABS
10.0000 mg | ORAL_TABLET | ORAL | Status: DC | PRN
  Administered 2022-07-08: 15 mg via ORAL
  Filled 2022-07-05: qty 3

## 2022-07-05 MED ORDER — METHOCARBAMOL 1000 MG/10ML IJ SOLN: 500.0000 mg | Freq: Four times a day (QID) | INTRAVENOUS | Status: DC | PRN

## 2022-07-05 MED ORDER — ONDANSETRON HCL 4 MG/2ML IJ SOLN: 4.0000 mg | Freq: Once | INTRAMUSCULAR | Status: DC | PRN

## 2022-07-05 MED ORDER — PHENYLEPHRINE HCL-NACL 20-0.9 MG/250ML-% IV SOLN
INTRAVENOUS | Status: DC | PRN
  Administered 2022-07-05: 30 ug/min via INTRAVENOUS

## 2022-07-05 MED ORDER — SODIUM CHLORIDE 0.9 % IR SOLN
Status: DC | PRN
  Administered 2022-07-05: 1000 mL

## 2022-07-05 MED ORDER — CEFAZOLIN SODIUM-DEXTROSE 2-4 GM/100ML-% IV SOLN
2.0000 g | INTRAVENOUS | Status: AC
  Administered 2022-07-05: 2 g via INTRAVENOUS

## 2022-07-05 MED ORDER — 0.9 % SODIUM CHLORIDE (POUR BTL) OPTIME
TOPICAL | Status: DC | PRN
  Administered 2022-07-05: 1000 mL

## 2022-07-05 MED ORDER — VANCOMYCIN HCL 1000 MG IV SOLR
INTRAVENOUS | Status: DC | PRN
  Administered 2022-07-05: 1000 mg via TOPICAL

## 2022-07-05 MED ORDER — ROPIVACAINE HCL 5 MG/ML IJ SOLN
INTRAMUSCULAR | Status: DC | PRN
  Administered 2022-07-05: 30 mL via PERINEURAL

## 2022-07-05 MED ORDER — MIDAZOLAM HCL 2 MG/2ML IJ SOLN
INTRAMUSCULAR | Status: AC
  Administered 2022-07-05: 2 mg via INTRAVENOUS
  Filled 2022-07-05: qty 2

## 2022-07-05 MED ORDER — OXYCODONE HCL 5 MG PO TABS
5.0000 mg | ORAL_TABLET | ORAL | Status: DC | PRN
  Administered 2022-07-07: 10 mg via ORAL
  Filled 2022-07-05: qty 2

## 2022-07-05 MED ORDER — DEXAMETHASONE SODIUM PHOSPHATE 10 MG/ML IJ SOLN
INTRAMUSCULAR | Status: DC | PRN
  Administered 2022-07-05: 10 mg via INTRAVENOUS

## 2022-07-05 MED ORDER — BUPIVACAINE-MELOXICAM ER 400-12 MG/14ML IJ SOLN
INTRAMUSCULAR | Status: AC
  Filled 2022-07-05: qty 1

## 2022-07-05 SURGICAL SUPPLY — 86 items
ALCOHOL 70% 16 OZ (MISCELLANEOUS) ×2 IMPLANT
BAG COUNTER SPONGE SURGICOUNT (BAG) IMPLANT
BAG DECANTER FOR FLEXI CONT (MISCELLANEOUS) ×2 IMPLANT
BANDAGE ESMARK 6X9 LF (GAUZE/BANDAGES/DRESSINGS) IMPLANT
BLADE SAG 18X100X1.27 (BLADE) ×2 IMPLANT
BNDG ESMARK 6X9 LF (GAUZE/BANDAGES/DRESSINGS)
BOWL SMART MIX CTS (DISPOSABLE) ×2 IMPLANT
CEMENT BONE R 1X40 (Cement) IMPLANT
CEMENT BONE REFOBACIN R1X40 US (Cement) ×2 IMPLANT
CLSR STERI-STRIP ANTIMIC 1/2X4 (GAUZE/BANDAGES/DRESSINGS) ×4 IMPLANT
COMP FEM CMT PERSONA SZ7 RT (Joint) ×2 IMPLANT
COMPONENT FEM CMT PRSONA SZ7RT (Joint) IMPLANT
COOLER ICEMAN CLASSIC (MISCELLANEOUS) ×2 IMPLANT
COVER SURGICAL LIGHT HANDLE (MISCELLANEOUS) ×2 IMPLANT
CUFF TOURN SGL QUICK 34 (TOURNIQUET CUFF) ×1
CUFF TOURN SGL QUICK 42 (TOURNIQUET CUFF) IMPLANT
CUFF TRNQT CYL 34X4.125X (TOURNIQUET CUFF) ×1 IMPLANT
DERMABOND ADVANCED (GAUZE/BANDAGES/DRESSINGS) ×1
DERMABOND ADVANCED .7 DNX12 (GAUZE/BANDAGES/DRESSINGS) ×1 IMPLANT
DRAPE EXTREMITY T 121X128X90 (DISPOSABLE) ×2 IMPLANT
DRAPE HALF SHEET 40X57 (DRAPES) ×2 IMPLANT
DRAPE INCISE IOBAN 66X45 STRL (DRAPES) ×2 IMPLANT
DRAPE ORTHO SPLIT 77X108 STRL (DRAPES) ×2
DRAPE POUCH INSTRU U-SHP 10X18 (DRAPES) ×2 IMPLANT
DRAPE SURG ORHT 6 SPLT 77X108 (DRAPES) ×2 IMPLANT
DRAPE U-SHAPE 47X51 STRL (DRAPES) ×4 IMPLANT
DRSG AQUACEL AG ADV 3.5X10 (GAUZE/BANDAGES/DRESSINGS) ×2 IMPLANT
DURAPREP 26ML APPLICATOR (WOUND CARE) ×6 IMPLANT
ELECT CAUTERY BLADE 6.4 (BLADE) ×2 IMPLANT
ELECT REM PT RETURN 9FT ADLT (ELECTROSURGICAL) ×2
ELECTRODE REM PT RTRN 9FT ADLT (ELECTROSURGICAL) ×1 IMPLANT
GLOVE BIOGEL PI IND STRL 7.0 (GLOVE) ×1 IMPLANT
GLOVE BIOGEL PI IND STRL 7.5 (GLOVE) ×4 IMPLANT
GLOVE BIOGEL PI INDICATOR 7.0 (GLOVE) ×1
GLOVE BIOGEL PI INDICATOR 7.5 (GLOVE) ×4
GLOVE ECLIPSE 7.0 STRL STRAW (GLOVE) ×6 IMPLANT
GLOVE SKINSENSE NS SZ7.5 (GLOVE) ×3
GLOVE SKINSENSE STRL SZ7.5 (GLOVE) ×3 IMPLANT
GLOVE SURG UNDER LTX SZ7.5 (GLOVE) ×4 IMPLANT
GLOVE SURG UNDER POLY LF SZ7 (GLOVE) ×4 IMPLANT
GOWN STRL REIN XL XLG (GOWN DISPOSABLE) ×2 IMPLANT
GOWN STRL REUS W/ TWL LRG LVL3 (GOWN DISPOSABLE) ×1 IMPLANT
GOWN STRL REUS W/TWL LRG LVL3 (GOWN DISPOSABLE) ×1
HANDPIECE INTERPULSE COAX TIP (DISPOSABLE) ×1
HDLS TROCR DRIL PIN KNEE 75 (PIN) ×1
HOOD PEEL AWAY FLYTE STAYCOOL (MISCELLANEOUS) ×4 IMPLANT
INSERT TIB ASF PERS CD6-7 RT (Insert) ×1 IMPLANT
JET LAVAGE IRRISEPT WOUND (IRRIGATION / IRRIGATOR) ×2
KIT BASIN OR (CUSTOM PROCEDURE TRAY) ×2 IMPLANT
KIT TURNOVER KIT B (KITS) ×2 IMPLANT
LAVAGE JET IRRISEPT WOUND (IRRIGATION / IRRIGATOR) ×1 IMPLANT
MANIFOLD NEPTUNE II (INSTRUMENTS) ×2 IMPLANT
MARKER SKIN DUAL TIP RULER LAB (MISCELLANEOUS) ×4 IMPLANT
NDL SPNL 18GX3.5 QUINCKE PK (NEEDLE) ×1 IMPLANT
NEEDLE SPNL 18GX3.5 QUINCKE PK (NEEDLE) ×2 IMPLANT
NS IRRIG 1000ML POUR BTL (IV SOLUTION) ×2 IMPLANT
PACK TOTAL JOINT (CUSTOM PROCEDURE TRAY) ×2 IMPLANT
PAD ARMBOARD 7.5X6 YLW CONV (MISCELLANEOUS) ×4 IMPLANT
PAD COLD SHLDR WRAP-ON (PAD) ×2 IMPLANT
PIN DRILL HDLS TROCAR 75 4PK (PIN) IMPLANT
SAW OSC TIP CART 19.5X105X1.3 (SAW) ×2 IMPLANT
SCREW FEMALE HEX FIX 25X2.5 (ORTHOPEDIC DISPOSABLE SUPPLIES) ×1 IMPLANT
SET HNDPC FAN SPRY TIP SCT (DISPOSABLE) ×1 IMPLANT
SOLUTION PRONTOSAN WOUND 350ML (IRRIGATION / IRRIGATOR) ×1 IMPLANT
STAPLER VISISTAT 35W (STAPLE) IMPLANT
STEM POLY PAT PLY 32M KNEE (Knees) ×1 IMPLANT
STEM TIBIA 5 DEG SZ D R KNEE (Knees) IMPLANT
SUCTION FRAZIER HANDLE 10FR (MISCELLANEOUS) ×1
SUCTION TUBE FRAZIER 10FR DISP (MISCELLANEOUS) ×1 IMPLANT
SUT ETHILON 2 0 FS 18 (SUTURE) IMPLANT
SUT MNCRL AB 3-0 PS2 27 (SUTURE) IMPLANT
SUT VIC AB 0 CT1 27 (SUTURE) ×2
SUT VIC AB 0 CT1 27XBRD ANBCTR (SUTURE) ×2 IMPLANT
SUT VIC AB 1 CTX 27 (SUTURE) ×6 IMPLANT
SUT VIC AB 1 CTX 36 (SUTURE) ×1
SUT VIC AB 1 CTX36XBRD ANBCTR (SUTURE) IMPLANT
SUT VIC AB 2-0 CT1 27 (SUTURE) ×4
SUT VIC AB 2-0 CT1 36 (SUTURE) ×2 IMPLANT
SUT VIC AB 2-0 CT1 TAPERPNT 27 (SUTURE) ×4 IMPLANT
SYR 50ML LL SCALE MARK (SYRINGE) ×4 IMPLANT
TIBIA STEM 5 DEG SZ D R KNEE (Knees) ×2 IMPLANT
TOWEL GREEN STERILE (TOWEL DISPOSABLE) ×2 IMPLANT
TOWEL GREEN STERILE FF (TOWEL DISPOSABLE) ×2 IMPLANT
TRAY CATH 16FR W/PLASTIC CATH (SET/KITS/TRAYS/PACK) IMPLANT
UNDERPAD 30X36 HEAVY ABSORB (UNDERPADS AND DIAPERS) ×2 IMPLANT
YANKAUER SUCT BULB TIP NO VENT (SUCTIONS) ×4 IMPLANT

## 2022-07-05 NOTE — Transfer of Care (Signed)
Immediate Anesthesia Transfer of Care Note  Patient: Jean Howell  Procedure(s) Performed: RIGHT TOTAL KNEE ARTHROPLASTY (Right: Knee)  Patient Location: PACU  Anesthesia Type:General  Level of Consciousness: drowsy and patient cooperative  Airway & Oxygen Therapy: Patient Spontanous Breathing and Patient connected to nasal cannula oxygen  Post-op Assessment: Report given to RN, Post -op Vital signs reviewed and stable and Patient moving all extremities X 4  Post vital signs: Reviewed and stable  Last Vitals:  Vitals Value Taken Time  BP 118/80 07/05/22 1124  Temp    Pulse 64 07/05/22 1126  Resp 14 07/05/22 1126  SpO2 95 % 07/05/22 1126  Vitals shown include unvalidated device data.  Last Pain:  Vitals:   07/05/22 0815  TempSrc:   PainSc: 0-No pain         Complications: No notable events documented.

## 2022-07-05 NOTE — Anesthesia Postprocedure Evaluation (Signed)
Anesthesia Post Note  Patient: [name]  Procedure(s) Performed: RIGHT TOTAL KNEE ARTHROPLASTY (Right: Knee)     Patient location during evaluation: PACU Anesthesia Type: Spinal and General Level of consciousness: awake and alert Pain management: pain level controlled Vital Signs Assessment: post-procedure vital signs reviewed and stable Respiratory status: spontaneous breathing, nonlabored ventilation and respiratory function stable Cardiovascular status: blood pressure returned to baseline and stable Postop Assessment: no apparent nausea or vomiting, spinal receding, no headache and no backache Anesthetic complications: no   No notable events documented.  Last Vitals:  Vitals:   07/05/22 1215 07/05/22 1237  BP: (!) 104/59 117/69  Pulse: (!) 55 (!) 59  Resp: 15 18  Temp: (!) 36.4 C   SpO2: 100% 100%    Last Pain:  Vitals:   07/05/22 1237  TempSrc:   PainSc: 2                  Lidia Collum

## 2022-07-05 NOTE — Anesthesia Procedure Notes (Signed)
Spinal  Patient location during procedure: OR Reason for block: surgical anesthesia Staffing Performed: anesthesiologist  Anesthesiologist: Toluwani Yadav E, MD Performed by: Jeremaih Klima E, MD Authorized by: Althea Backs E, MD   Preanesthetic Checklist Completed: patient identified, IV checked, risks and benefits discussed, surgical consent, monitors and equipment checked, pre-op evaluation and timeout performed Spinal Block Patient position: sitting Prep: DuraPrep and site prepped and draped Patient monitoring: continuous pulse ox, blood pressure and heart rate Approach: midline Location: L3-4 Injection technique: single-shot Needle Needle type: Pencan  Needle gauge: 24 G Needle length: 10 cm Assessment Events: CSF return Additional Notes Functioning IV was confirmed and monitors were applied. Sterile prep and drape, including hand hygiene and sterile gloves were used. The patient was positioned and the spine was prepped. The skin was anesthetized with lidocaine.  Free flow of clear CSF was obtained prior to injecting local anesthetic into the CSF. The needle was carefully withdrawn. The patient tolerated the procedure well.      

## 2022-07-05 NOTE — H&P (Signed)
PREOPERATIVE H&P  Chief Complaint: right knee degenerative joint disease  HPI: Jean Howell is a 66 y.o. female who presents for surgical treatment of right knee degenerative joint disease.  She denies any changes in medical history.  Past Medical History:  Diagnosis Date   Arthritis    Depression    Hypertension    Rheumatoid arthritis ([place])    Past Surgical History:  Procedure Laterality Date   BREAST EXCISIONAL BIOPSY Bilateral    pt unsure when- benign   BREAST SURGERY     nodules removed   COLONOSCOPY N/A 06/25/2019   Procedure: COLONOSCOPY;  Surgeon: Danie Binder, MD;  Location: [facility];  Service: Endoscopy;  Laterality: N/A;  1:30   MOLE REMOVAL Right 01/26/2022   foot (patient was put to sleep for procedure)   POLYPECTOMY  06/25/2019   Procedure: POLYPECTOMY;  Surgeon: Danie Binder, MD;  Location: [facility];  Service: Endoscopy;;   Social History   Socioeconomic History   Marital status: Widowed    Spouse name: Not on file   Number of children: 1   Years of education: Not on file   Highest education level: GED or equivalent  Occupational History   Occupation: retired  Tobacco Use   Smoking status: Former    Packs/day: 1.00    Years: 15.00    Total pack years: 15.00    Types: Cigarettes    Quit date: 2000    Years since quitting: 23.5    Passive exposure: Never   Smokeless tobacco: Never  Vaping Use   Vaping Use: Never used  Substance and Sexual Activity   Alcohol use: Not Currently   Drug use: Never   Sexual activity: Not Currently    Comment: widowed  Other Topics Concern   Not on file  Social History Narrative   Patient is a widow that resides independently in [place].  She has 1 son, who resides in [place].   Social Determinants of Health   Financial Resource Strain: Low Risk  (06/18/2022)   Overall Financial Resource Strain (CARDIA)    Difficulty of Paying Living Expenses: Not hard at all  Food Insecurity: No  Food Insecurity (06/18/2022)   Hunger Vital Sign    Worried About Running Out of Food in the Last Year: Never true    Ran Out of Food in the Last Year: Never true  Transportation Needs: No Transportation Needs (06/18/2022)   PRAPARE - Hydrologist (Medical): No    Lack of Transportation (Non-Medical): No  Physical Activity: Inactive (06/18/2022)   Exercise Vital Sign    Days of Exercise per Week: 0 days    Minutes of Exercise per Session: 0 min  Stress: No Stress Concern Present (06/18/2022)   [address]    Feeling of Stress : Not at all  Social Connections: Moderately Integrated (06/18/2022)   Social Connection and Isolation Panel [NHANES]    Frequency of Communication with Friends and Family: More than three times a week    Frequency of Social Gatherings with Friends and Family: Twice a week    Attends Religious Services: More than 4 times per year    Active Member of [employer] or Organizations: Yes    Attends Music therapist: More than 4 times per year    Marital Status: Never married   Family History  Problem Relation Age of Onset   Cancer Father  Colon cancer Father    Thyroid disease Mother    CVA Mother 50   Thyroid disease Sister    Healthy Son    Congestive Heart Failure Sister    Hypertension Sister    Diabetes Sister    Hypertension Sister    Diabetes Sister    Hypertension Sister    Hypertension Sister    Cancer Brother        kidney    No Known Allergies Prior to Admission medications   Medication Sig Start Date End Date Taking? Authorizing Provider  amitriptyline (ELAVIL) 25 MG tablet Take 1 tablet (25 mg total) by mouth at bedtime. 09/15/21  Yes Ronnie Doss M, DO  aspirin EC 81 MG tablet Take 1 tablet (81 mg total) by mouth 2 (two) times daily. To be taken after surgery to prevent blood clots.  Swallow whole. 06/28/22 06/28/23  Aundra Dubin, PA-C   Cholecalciferol (VITAMIN D-3) 125 MCG (5000 UT) TABS Take 5,000 mg by mouth daily.   Yes [provider]  Cyanocobalamin (VITAMIN B 12 PO) Take 2,500 mg by mouth daily.   Yes [provider]  docusate sodium (COLACE) 100 MG capsule Take 1 capsule (100 mg total) by mouth daily as needed. 06/28/22 06/28/23 Yes Aundra Dubin, PA-C  Etanercept (ENBREL MINI) 50 MG/ML SOCT Inject 50 mg into the skin once a week. 04/08/22  Yes Deveshwar, Abel Presto, MD  fluticasone (FLONASE) 50 MCG/ACT nasal spray Place 2 sprays into both nostrils daily. Patient taking differently: Place 2 sprays into both nostrils daily as needed (Ears stopped up). 03/16/22  Yes Gottschalk, Leatrice Jewels M, DO  gabapentin (NEURONTIN) 400 MG capsule TAKE ONE CAPSULE BY MOUTH AT BEDTIME 05/21/22  Yes Gottschalk, Ashly M, DO  leflunomide (ARAVA) 20 MG tablet TAKE ONE (1) TABLET EACH DAY 04/05/22  Yes Deveshwar, Abel Presto, MD  losartan-hydrochlorothiazide (HYZAAR) 100-12.5 MG tablet TAKE ONE (1) TABLET EACH DAY 09/15/21  Yes Gottschalk, Ashly M, DO  methocarbamol (ROBAXIN-750) 750 MG tablet Take 1 tablet (750 mg total) by mouth 2 (two) times daily as needed for muscle spasms. 06/28/22   Aundra Dubin, PA-C  Multiple Vitamin (MULTIVITAMIN PO) Take 1 tablet by mouth daily.   Yes [provider]  ondansetron (ZOFRAN) 4 MG tablet Take 1 tablet (4 mg total) by mouth every 8 (eight) hours as needed for nausea or vomiting. 06/28/22   Aundra Dubin, PA-C  oxyCODONE-acetaminophen (PERCOCET) 5-325 MG tablet Take 1-2 tablets by mouth every 6 (six) hours as needed. To be taken after surgery 06/28/22   Aundra Dubin, PA-C  rosuvastatin (CRESTOR) 10 MG tablet Take 1 tablet (10 mg total) by mouth daily. 03/17/22  Yes Ronnie Doss M, DO  Calcium-Magnesium-Zinc (CAL-MAG-ZINC PO) Take 1 tablet by mouth daily.    [provider]  Suvorexant (BELSOMRA) 10 MG TABS Take 10 mg by mouth at bedtime as needed (sleep). 03/16/22   [name], DO     Positive ROS: All other systems have been reviewed and were otherwise negative with the exception of those mentioned in the HPI and as above.  Physical Exam: General: Alert, no acute distress Cardiovascular: No pedal edema Respiratory: No cyanosis, no use of accessory musculature GI: abdomen soft Skin: No lesions in the area of chief complaint Neurologic: Sensation intact distally Psychiatric: Patient is competent for consent with normal mood and affect Lymphatic: no lymphedema  MUSCULOSKELETAL: exam stable  Assessment: right knee degenerative joint disease  Plan: Plan for Procedure(s): RIGHT  TOTAL KNEE ARTHROPLASTY  The risks benefits and alternatives were discussed with the patient including but not limited to the risks of nonoperative treatment, versus surgical intervention including infection, bleeding, nerve injury,  blood clots, cardiopulmonary complications, morbidity, mortality, among others, and they were willing to proceed.   Eduard Roux, MD 07/05/2022 7:06 AM

## 2022-07-05 NOTE — Anesthesia Procedure Notes (Signed)
Anesthesia Regional Block: Adductor canal block   Pre-Anesthetic Checklist: , timeout performed,  Correct Patient, Correct Site, Correct Laterality,  Correct Procedure, Correct Position, site marked,  Risks and benefits discussed,  Surgical consent,  Pre-op evaluation,  At surgeon's request and post-op pain management  Laterality: Right  Prep: chloraprep       Needles:  Injection technique: Single-shot  Needle Type: Echogenic Stimulator Needle     Needle Length: 10cm  Needle Gauge: 20     Additional Needles:   Procedures:,,,, ultrasound used (permanent image in chart),,    Narrative:  Start time: 07/05/2022 8:10 AM End time: 07/05/2022 8:14 AM Injection made incrementally with aspirations every 5 mL.  Performed by: Personally  Anesthesiologist: Lidia Collum, MD  Additional Notes: Standard monitors applied. Skin prepped. Good needle visualization with ultrasound. Injection made in 5cc increments with no resistance to injection. Patient tolerated the procedure well.

## 2022-07-05 NOTE — Discharge Instructions (Signed)

## 2022-07-05 NOTE — Anesthesia Preprocedure Evaluation (Signed)
Anesthesia Evaluation  Patient identified by MRN, date of birth, ID band Patient awake    Reviewed: Allergy & Precautions, NPO status , Patient's Chart, lab work & pertinent test results  History of Anesthesia Complications Negative for: history of anesthetic complications  Airway Mallampati: I  TM Distance: >3 FB Neck ROM: Full    Dental  (+) Edentulous Upper, Edentulous Lower   Pulmonary neg pulmonary ROS, former smoker,    Pulmonary exam normal        Cardiovascular hypertension, Normal cardiovascular exam     Neuro/Psych negative neurological ROS     GI/Hepatic negative GI ROS, Neg liver ROS,   Endo/Other  negative endocrine ROS  Renal/GU negative Renal ROS  negative genitourinary   Musculoskeletal  (+) Arthritis , Osteoarthritis and Rheumatoid disorders,    Abdominal   Peds  Hematology negative hematology ROS (+)   Anesthesia Other Findings   Reproductive/Obstetrics                            Anesthesia Physical Anesthesia Plan  ASA: 2  Anesthesia Plan: Spinal   Post-op Pain Management: Toradol IV (intra-op)*, Ofirmev IV (intra-op)* and Regional block*   Induction:   PONV Risk Score and Plan: 2 and Propofol infusion, Treatment may vary due to age or medical condition, Ondansetron and TIVA  Airway Management Planned: Nasal Cannula and Simple Face Mask  Additional Equipment: None  Intra-op Plan:   Post-operative Plan:   Informed Consent: I have reviewed the patients History and Physical, chart, labs and discussed the procedure including the risks, benefits and alternatives for the proposed anesthesia with the patient or authorized representative who has indicated his/her understanding and acceptance.       Plan Discussed with:   Anesthesia Plan Comments:         Anesthesia Quick Evaluation

## 2022-07-05 NOTE — Evaluation (Signed)
Physical Therapy Evaluation Patient Details Name: Jean Howell MRN: [id number] DOB: [birth year] Today's Date: 07/05/2022  History of Present Illness  Pt is 66 yo female s/p R TKA 07/05/20.  Pt with hx including but not limited to arthritis, HTN, and RA.  Clinical Impression  Pt is s/p TKA resulting in the deficits listed below (see PT Problem List). At baseline, pt is independent and lives alone.  Today, pt was able to participate but was limited by nausea and syncopal symptoms (VSS).  She ambulated 12' to bathroom with min guard, had good quad activation, sensation intact, and good pain control.  Pt is expected to progress well but does live alone and reports plan is to go to SNF at d/c. Pt will benefit from skilled PT to increase their independence and safety with mobility to allow discharge to the venue listed below.         Recommendations for follow up therapy are one component of a multi-disciplinary discharge planning process, led by the attending physician.  Recommendations may be updated based on patient status, additional functional criteria and insurance authorization.  Follow Up Recommendations Follow physician's recommendations for discharge plan and follow up therapies (pt reports plan is for SNF)      Assistance Recommended at Discharge Frequent or constant Supervision/Assistance  Patient can return home with the following  A little help with walking and/or transfers;A little help with bathing/dressing/bathroom;Assistance with cooking/housework;Help with stairs or ramp for entrance    Equipment Recommendations None recommended by PT  Recommendations for Other Services       Functional Status Assessment Patient has had a recent decline in their functional status and demonstrates the ability to make significant improvements in function in a reasonable and predictable amount of time.     Precautions / Restrictions Precautions Precautions: Fall Restrictions Weight Bearing  Restrictions: Yes RLE Weight Bearing: Weight bearing as tolerated      Mobility  Bed Mobility Overal bed mobility: Needs Assistance Bed Mobility: Supine to Sit, Sit to Supine     Supine to sit: Min assist Sit to supine: Min assist   General bed mobility comments: cues and light min A for R LE    Transfers Overall transfer level: Needs assistance Equipment used: Rolling walker (2 wheels) Transfers: Sit to/from Stand, Bed to chair/wheelchair/BSC Sit to Stand: Mod assist   Step pivot transfers: Min guard       General transfer comment: Cues for hand placement and R LE management; stood from bed, recliner, and toilet using grab bar;  Step Pivot toilet to recliner and recliner to bed with cues    Ambulation/Gait Ambulation/Gait assistance: Min guard Gait Distance (Feet): 12 Feet Assistive device: Rolling walker (2 wheels) Gait Pattern/deviations: Step-to pattern, Decreased stride length, Decreased weight shift to right Gait velocity: decreased     General Gait Details: Cues for sequence and RW use  Stairs            Wheelchair Mobility    Modified Rankin (Stroke Patients Only)       Balance Overall balance assessment: Needs assistance Sitting-balance support: No upper extremity supported Sitting balance-Leahy Scale: Good     Standing balance support: Bilateral upper extremity supported, Reliant on assistive device for balance Standing balance-Leahy Scale: Poor Standing balance comment: steady with RW                             Pertinent Vitals/Pain Pain Assessment Pain Assessment: No/denies  pain    Home Living Family/patient expects to be discharged to:: Skilled nursing facility (Pt from home plans to go SNF) Living Arrangements: Alone Available Help at Discharge: Family;Available PRN/intermittently Type of Home: House Home Access: Stairs to enter Entrance Stairs-Rails: Psychiatric nurse of Steps: 5   Home Layout:  One level Home Equipment: Conservation officer, nature (2 wheels);Shower seat;Grab bars - tub/shower      Prior Function Prior Level of Function : Driving;Independent/Modified Independent             Mobility Comments: Pt could ambulate short community distances - limited by knee pain ADLs Comments: Reports independent with ADLs, IADLs     Hand Dominance        Extremity/Trunk Assessment   Upper Extremity Assessment Upper Extremity Assessment: Overall WFL for tasks assessed    Lower Extremity Assessment Lower Extremity Assessment: LLE deficits/detail;RLE deficits/detail RLE Deficits / Details: ROM: knee 5 to 80 degrees, hip and ankle WFL; MMT: ankle 5/5, knee and hip 3/5 RLE Sensation: WNL LLE Deficits / Details: ROM WFL; MMT 5/5    Cervical / Trunk Assessment Cervical / Trunk Assessment: Normal  Communication   Communication: No difficulties  Cognition Arousal/Alertness: Awake/alert Behavior During Therapy: WFL for tasks assessed/performed Overall Cognitive Status: Within Functional Limits for tasks assessed                                          General Comments General comments (skin integrity, edema, etc.): Pt on 2 L O2 at arrival sats 100% , switched to RA sats remained 100%-RNaware.  Pt nauseated to begin session but wanting to participate.  RN gave meds and pt sat up, bealched, and felt better. She then ambulated to bathroom.  In bathroom, pt reports hot, slight increase in nausea, and seemed more lethargic. Took recliner in restroom, had pt transfer to recliner and moved back to bedside.  Checked and O2 sats 100% and BP 132/70.  Had pt stand BP 127/72 and then transferred back to bed. RN present and aware    Exercises     Assessment/Plan    PT Assessment Patient needs continued PT services  PT Problem List Decreased strength;Decreased mobility;Decreased range of motion;Decreased activity tolerance;Decreased balance;Decreased knowledge of use of  DME;Pain;Decreased knowledge of precautions       PT Treatment Interventions DME instruction;Therapeutic activities;Modalities;Gait training;Therapeutic exercise;Patient/family education;Balance training;Functional mobility training;Stair training    PT Goals (Current goals can be found in the Care Plan section)  Acute Rehab PT Goals Patient Stated Goal: rehab then home PT Goal Formulation: With patient Time For Goal Achievement: 07/19/22 Potential to Achieve Goals: Good    Frequency 7X/week     Co-evaluation               AM-PAC PT "6 Clicks" Mobility  Outcome Measure Help needed turning from your back to your side while in a flat bed without using bedrails?: A Little Help needed moving from lying on your back to sitting on the side of a flat bed without using bedrails?: A Little Help needed moving to and from a bed to a chair (including a wheelchair)?: A Little Help needed standing up from a chair using your arms (e.g., wheelchair or bedside chair)?: A Little Help needed to walk in hospital room?: Total Help needed climbing 3-5 steps with a railing? : Total 6 Click Score: 14    End  of Session Equipment Utilized During Treatment: Gait belt Activity Tolerance: Other (comment) (Tolerated well initially but then some syncopal symptoms) Patient left: in bed Nurse Communication: Mobility status;Other (comment) (nausea and syncopal symptoms) PT Visit Diagnosis: Other abnormalities of gait and mobility (R26.89);Muscle weakness (generalized) (M62.81)    Time: 4935-5217 PT Time Calculation (min) (ACUTE ONLY): 26 min   Charges:   PT Evaluation $PT Eval Moderate Complexity: 1 Mod PT Treatments $Therapeutic Activity: 8-22 mins        Abran Richard, PT [facility] [phone]   Karlton Lemon 07/05/2022, 5:44 PM

## 2022-07-05 NOTE — Op Note (Signed)
Total Knee Arthroplasty Procedure Note  Preoperative diagnosis: Right knee osteoarthritis  Postoperative diagnosis:same  Operative procedure: Right total knee arthroplasty. CPT [phone]  Surgeon: N. Eduard Roux, MD  Assist: Madalyn Rob, PA-C; necessary for the timely completion of procedure and due to complexity of procedure.  Anesthesia: Spinal, regional, local  Tourniquet time: see anesthesia record  Implants used: Zimmer persona Femur: CR 7 Tibia: D Patella: 32 mm Polyethylene: 11 mm, MC  Indication: Jean Howell is a 66 y.o. year old female with a history of knee pain. Having failed conservative management, the patient elected to proceed with a total knee arthroplasty.  We have reviewed the risk and benefits of the surgery and they elected to proceed after voicing understanding.  Procedure:  After informed consent was obtained and understanding of the risk were voiced including but not limited to bleeding, infection, damage to surrounding structures including nerves and vessels, blood clots, leg length inequality and the failure to achieve desired results, the operative extremity was marked with verbal confirmation of the patient in the holding area.   The patient was then brought to the operating room and transported to the operating room table in the supine position.  A tourniquet was applied to the operative extremity around the upper thigh. The operative limb was then prepped and draped in the usual sterile fashion and preoperative antibiotics were administered.  A time out was performed prior to the start of surgery confirming the correct extremity, preoperative antibiotic administration, as well as team members, implants and instruments available for the case. Correct surgical site was also confirmed with preoperative radiographs. The limb was then elevated for exsanguination and the tourniquet was inflated. A midline incision was made and a standard medial  parapatellar approach was performed.  The infrapatellar fat pad was removed.  Suprapatellar synovium was removed to reveal the anterior distal femoral cortex.  A medial peel was performed to release the capsule of the medial tibial plateau.  The patella was then everted and was prepared and sized to a 32 mm.  A cover was placed on the patella for protection from retractors.  The knee was then brought into full flexion and we then turned our attention to the femur.  The cruciates were sacrificed.  Start site was drilled in the femur and the intramedullary distal femoral cutting guide was placed, set at 5 degrees valgus, taking 10 mm of distal resection. The distal cut was made. Osteophytes were then removed.  Next, the proximal tibial cutting guide was placed with appropriate slope, varus/valgus alignment and depth of resection. The proximal tibial cut was made taking 2 mm off the low side. Gap blocks were then used to assess the extension gap and alignment, and appropriate soft tissue releases were performed. Attention was turned back to the femur, which was sized using the sizing guide to a size 7. Appropriate rotation of the femoral component was determined using epicondylar axis, Whiteside's line, and assessing the flexion gap under ligament tension. The appropriate size 4-in-1 cutting block was placed and checked with an angel wing and cuts were made. Posterior femoral osteophytes and uncapped bone were then removed with the curved osteotome.  Trial components were placed, and stability was checked in full extension, mid-flexion, and deep flexion. Proper tibial rotation was determined and marked.  The patella tracked well without a lateral release.  The femoral lugs were then drilled. Trial components were then removed and tibial preparation performed.  The tibia was sized for a size  D component.   The bony surfaces were irrigated with a pulse lavage and then dried. Bone cement was vacuum mixed on the back  table, and the final components sized above were cemented into place.  Antibiotic irrigation was placed in the knee joint and soft tissues while the cement cured.  After cement had finished curing, excess cement was removed. The stability of the construct was re-evaluated throughout a range of motion and found to be acceptable. The trial liner was removed, the knee was copiously irrigated, and the knee was re-evaluated for any excess bone debris. The real polyethylene liner, 11 mm thick, was inserted and checked to ensure the locking mechanism had engaged appropriately. The tourniquet was deflated and hemostasis was achieved. The wound was irrigated with normal saline.  One gram of vancomycin powder was placed in the surgical bed.  Topical 0.25% bupivacaine and meloxicam was placed in the joint for postoperative pain.  Capsular closure was performed with a #1 vicryl, subcutaneous fat closed with a 0 vicryl suture, then subcutaneous tissue closed with interrupted 2.0 vicryl suture. The skin was then closed with a 2.0 nylon and dermabond. A sterile dressing was applied.  The patient was awakened in the operating room and taken to recovery in stable condition. All sponge, needle, and instrument counts were correct at the end of the case.  Tawanna Cooler was necessary for opening, closing, retracting, limb positioning and overall facilitation and completion of the surgery.  Position: supine  Complications: none.  Time Out: performed   Drains/Packing: none  Estimated blood loss: minimal  Returned to Recovery Room: in good condition.   Antibiotics: yes   Mechanical VTE (DVT) Prophylaxis: sequential compression devices, TED thigh-high  Chemical VTE (DVT) Prophylaxis: aspirin  Fluid Replacement  Crystalloid: see anesthesia record Blood: none  FFP: none   Specimens Removed: 1 to pathology   Sponge and Instrument Count Correct? yes   PACU: portable radiograph - knee AP and Lateral   Plan/RTC:  Return in 2 weeks for wound check.   Weight Bearing/Load Lower Extremity: full   Implant Name Type Inv. Item Serial No. Manufacturer Lot No. LRB No. Used Action  CEMENT BONE REFOBACIN [id number] [place] - [id number] Cement CEMENT BONE REFOBACIN R1X40 [place]  ZIMMER RECON(ORTH,TRAU,BIO,SG) [id number] Right 2 Implanted  COMP FEM CMT PERSONA SZ7 RT - [id number] Joint COMP FEM CMT PERSONA SZ7 RT  ZIMMER RECON(ORTH,TRAU,BIO,SG) [id number] Right 1 Implanted  STEM POLY PAT PLY 62M KNEE - [id number] Knees STEM POLY PAT PLY 62M KNEE  ZIMMER RECON(ORTH,TRAU,BIO,SG) [id number] Right 1 Implanted  INSERT TIB ASF PERS CD6-7 RT - [id number] Insert INSERT TIB ASF PERS CD6-7 RT  ZIMMER RECON(ORTH,TRAU,BIO,SG) [id number] Right 1 Implanted  TIBIA STEM 5 DEG SZ D R KNEE - [id number] Knees TIBIA STEM 5 DEG SZ D R KNEE  ZIMMER RECON(ORTH,TRAU,BIO,SG) [id number] Right 1 Implanted    N. Eduard Roux, MD [facility] 10:46 AM

## 2022-07-06 ENCOUNTER — Other Ambulatory Visit (HOSPITAL_COMMUNITY): Payer: Self-pay

## 2022-07-06 DIAGNOSIS — G629 Polyneuropathy, unspecified: Secondary | ICD-10-CM | POA: Diagnosis not present

## 2022-07-06 DIAGNOSIS — Z87891 Personal history of nicotine dependence: Secondary | ICD-10-CM | POA: Diagnosis not present

## 2022-07-06 DIAGNOSIS — Z7982 Long term (current) use of aspirin: Secondary | ICD-10-CM | POA: Diagnosis not present

## 2022-07-06 DIAGNOSIS — D62 Acute posthemorrhagic anemia: Secondary | ICD-10-CM | POA: Diagnosis not present

## 2022-07-06 DIAGNOSIS — Z79899 Other long term (current) drug therapy: Secondary | ICD-10-CM | POA: Diagnosis not present

## 2022-07-06 DIAGNOSIS — Z7401 Bed confinement status: Secondary | ICD-10-CM | POA: Diagnosis not present

## 2022-07-06 DIAGNOSIS — R2689 Other abnormalities of gait and mobility: Secondary | ICD-10-CM | POA: Diagnosis not present

## 2022-07-06 DIAGNOSIS — E669 Obesity, unspecified: Secondary | ICD-10-CM | POA: Diagnosis present

## 2022-07-06 DIAGNOSIS — M1711 Unilateral primary osteoarthritis, right knee: Secondary | ICD-10-CM | POA: Diagnosis present

## 2022-07-06 DIAGNOSIS — F32A Depression, unspecified: Secondary | ICD-10-CM | POA: Diagnosis not present

## 2022-07-06 DIAGNOSIS — R531 Weakness: Secondary | ICD-10-CM | POA: Diagnosis not present

## 2022-07-06 DIAGNOSIS — M6281 Muscle weakness (generalized): Secondary | ICD-10-CM | POA: Diagnosis not present

## 2022-07-06 DIAGNOSIS — Z6837 Body mass index (BMI) 37.0-37.9, adult: Secondary | ICD-10-CM | POA: Diagnosis not present

## 2022-07-06 DIAGNOSIS — Z23 Encounter for immunization: Secondary | ICD-10-CM | POA: Diagnosis not present

## 2022-07-06 DIAGNOSIS — M069 Rheumatoid arthritis, unspecified: Secondary | ICD-10-CM | POA: Diagnosis present

## 2022-07-06 DIAGNOSIS — Z8249 Family history of ischemic heart disease and other diseases of the circulatory system: Secondary | ICD-10-CM | POA: Diagnosis not present

## 2022-07-06 DIAGNOSIS — G4700 Insomnia, unspecified: Secondary | ICD-10-CM | POA: Diagnosis not present

## 2022-07-06 DIAGNOSIS — I10 Essential (primary) hypertension: Secondary | ICD-10-CM | POA: Diagnosis not present

## 2022-07-06 DIAGNOSIS — M0579 Rheumatoid arthritis with rheumatoid factor of multiple sites without organ or systems involvement: Secondary | ICD-10-CM | POA: Diagnosis not present

## 2022-07-06 DIAGNOSIS — Z96651 Presence of right artificial knee joint: Secondary | ICD-10-CM | POA: Diagnosis not present

## 2022-07-06 DIAGNOSIS — E785 Hyperlipidemia, unspecified: Secondary | ICD-10-CM | POA: Diagnosis not present

## 2022-07-06 DIAGNOSIS — Z471 Aftercare following joint replacement surgery: Secondary | ICD-10-CM | POA: Diagnosis not present

## 2022-07-06 LAB — CBC
HCT: 29.8 % — ABNORMAL LOW (ref 36.0–46.0)
Hemoglobin: 10.1 g/dL — ABNORMAL LOW (ref 12.0–15.0)
MCH: 29.4 pg (ref 26.0–34.0)
MCHC: 33.9 g/dL (ref 30.0–36.0)
MCV: 86.9 fL (ref 80.0–100.0)
Platelets: 230 10*3/uL (ref 150–400)
RBC: 3.43 MIL/uL — ABNORMAL LOW (ref 3.87–5.11)
RDW: 14.6 % (ref 11.5–15.5)
WBC: 9.5 10*3/uL (ref 4.0–10.5)
nRBC: 0 % (ref 0.0–0.2)

## 2022-07-06 MED ORDER — ENBREL MINI 50 MG/ML ~~LOC~~ SOCT
50.0000 mg | SUBCUTANEOUS | 2 refills | Status: DC
  Filled 2022-07-06 – 2022-09-06 (×2): qty 4, 28d supply, fill #0
  Filled 2022-09-30: qty 4, 28d supply, fill #1
  Filled 2022-10-27: qty 4, 28d supply, fill #2

## 2022-07-06 MED ORDER — METHOCARBAMOL 750 MG PO TABS: 750.0000 mg | ORAL_TABLET | Freq: Two times a day (BID) | ORAL | 2 refills | Status: DC | PRN

## 2022-07-06 MED ORDER — ASPIRIN 81 MG PO TBEC: 81.0000 mg | DELAYED_RELEASE_TABLET | Freq: Two times a day (BID) | ORAL | 0 refills | Status: DC

## 2022-07-06 MED ORDER — OXYCODONE-ACETAMINOPHEN 5-325 MG PO TABS: 1.0000 | ORAL_TABLET | Freq: Four times a day (QID) | ORAL | 0 refills | Status: DC | PRN

## 2022-07-06 NOTE — Progress Notes (Signed)
Subjective: 1 Day Post-Op Procedure(s) (LRB): RIGHT TOTAL KNEE ARTHROPLASTY (Right) Patient reports pain as mild.    Objective: Vital signs in last 24 hours: Temp:  [97.2 F (36.2 C)-98.5 F (36.9 C)] 98.5 F (36.9 C) (08/01 0300) Pulse Rate:  [55-77] 66 (08/01 0300) Resp:  [9-19] 18 (08/01 0300) BP: (102-156)/(59-115) 118/68 (07/31 2354) SpO2:  [93 %-100 %] 100 % (08/01 0300)  Intake/Output from previous day: 07/31 0701 - 08/01 0700 In: 1200 [I.V.:1000; IV Piggyback:200] Out: 400 [Urine:100; Blood:300] Intake/Output this shift: No intake/output data recorded.  Recent Labs    07/06/22 0602  HGB 10.1*   Recent Labs    07/06/22 0602  WBC 9.5  RBC 3.43*  HCT 29.8*  PLT 230   No results for input(s): "NA", "K", "CL", "CO2", "BUN", "CREATININE", "GLUCOSE", "CALCIUM" in the last 72 hours. No results for input(s): "LABPT", "INR" in the last 72 hours.  Neurologically intact Neurovascular intact Sensation intact distally Intact pulses distally Dorsiflexion/Plantar flexion intact Incision: scant drainage No cellulitis present Compartment soft   Assessment/Plan: 1 Day Post-Op Procedure(s) (LRB): RIGHT TOTAL KNEE ARTHROPLASTY (Right) Advance diet Up with therapy D/C IV fluids Discharge to SNF once insurance approves and bed available.  Patient does not have any support at home and will not be safe to be d/c alone WBAT RLE ABLA- mild and stable   Anticipated LOS equal to or greater than 2 midnights due to - Age 68 and older with one or more of the following:  - Obesity  - Expected need for hospital services (PT, OT, Nursing) required for safe  discharge  - Anticipated need for postoperative skilled nursing care or inpatient rehab  - Active co-morbidities: None OR   - Unanticipated findings during/Post Surgery: Slow post-op progression: GI, pain control, mobility  - Patient is a high risk of re-admission due to: Barriers to post-acute care (logistical, no family  support in home)   Aundra Dubin 07/06/2022, 7:59 AM

## 2022-07-06 NOTE — Progress Notes (Addendum)
Physical Therapy Treatment Patient Details Name: Jean Howell MRN: [id number] DOB: [birth year] Today's Date: 07/06/2022   History of Present Illness Pt is 66 yo female s/p R TKA 07/05/20.  Pt with hx including but not limited to arthritis, HTN, and RA.    PT Comments    Pt demonstrating increased tolerance to activity. ModI for transfers and supervision for ambulation of hallway distances with RW. Pt able to negotiate 3 steps with bilateral rails and min guard. Continuing to make good progress toward functional goals. PT will continue to follow to address deficits in strength, ROM, and balance.    Recommendations for follow up therapy are one component of a multi-disciplinary discharge planning process, led by the attending physician.  Recommendations may be updated based on patient status, additional functional criteria and insurance authorization.  Follow Up Recommendations  Follow physician's recommendations for discharge plan and follow up therapies (pt reports plan for SNF)     Assistance Recommended at Discharge PRN  Patient can return home with the following A little help with bathing/dressing/bathroom;Assistance with cooking/housework;Assist for transportation;Help with stairs or ramp for entrance   Equipment Recommendations  None recommended by PT    Recommendations for Other Services       Precautions / Restrictions Precautions Precautions: Fall Restrictions Weight Bearing Restrictions: Yes RLE Weight Bearing: Weight bearing as tolerated     Mobility  Bed Mobility Overal bed mobility: Modified Independent                  Transfers Overall transfer level: Modified independent Equipment used: Rolling walker (2 wheels) Transfers: Sit to/from Stand Sit to Stand: Min guard           General transfer comment: min guard for safety    Ambulation/Gait Ambulation/Gait assistance: Supervision Gait Distance (Feet): 300 Feet Assistive device: Rolling walker  (2 wheels) Gait Pattern/deviations: Step-through pattern, Decreased stance time - right, Antalgic Gait velocity: decreased Gait velocity interpretation: >2.62 ft/sec, indicative of community ambulatory   General Gait Details: demonstrating increased gait speed and fluidity of movement   Stairs Stairs: Yes Stairs assistance: Min guard Stair Management: Two rails, Step to pattern, Forwards Number of Stairs: 3 General stair comments: cues for sequencing, demonstrating good balance and strength to clear RLE   Wheelchair Mobility    Modified Rankin (Stroke Patients Only)       Balance Overall balance assessment: Needs assistance Sitting-balance support: No upper extremity supported, Feet supported Sitting balance-Leahy Scale: Good     Standing balance support: Bilateral upper extremity supported Standing balance-Leahy Scale: Fair Standing balance comment: able to stand to perform pericare without RW                            Cognition Arousal/Alertness: Awake/alert Behavior During Therapy: WFL for tasks assessed/performed Overall Cognitive Status: Within Functional Limits for tasks assessed                                          Exercises Total Joint Exercises Quad Sets: AROM, Right, 10 reps, Supine Heel Slides: AROM, Right, 10 reps, Seated Straight Leg Raises: AROM, Right, 10 reps, Supine Long Arc Quad: AROM, Right, 10 reps, Seated Goniometric ROM: 4-100    General Comments        Pertinent Vitals/Pain Pain Assessment Pain Assessment: No/denies pain Faces Pain Scale: Hurts a little  bit Pain Location: R knee Pain Descriptors / Indicators: Discomfort, Guarding Pain Intervention(s): Limited activity within patient's tolerance, Monitored during session    Home Living                          Prior Function            PT Goals (current goals can now be found in the care plan section) Acute Rehab PT Goals Patient  Stated Goal: rehab then home PT Goal Formulation: With patient Time For Goal Achievement: 07/19/22 Potential to Achieve Goals: Good Progress towards PT goals: Progressing toward goals    Frequency    7X/week      PT Plan Current plan remains appropriate    Co-evaluation              AM-PAC PT "6 Clicks" Mobility   Outcome Measure  Help needed turning from your back to your side while in a flat bed without using bedrails?: None Help needed moving from lying on your back to sitting on the side of a flat bed without using bedrails?: None Help needed moving to and from a bed to a chair (including a wheelchair)?: None Help needed standing up from a chair using your arms (e.g., wheelchair or bedside chair)?: None Help needed to walk in hospital room?: A Little Help needed climbing 3-5 steps with a railing? : A Little 6 Click Score: 22    End of Session Equipment Utilized During Treatment: Gait belt Activity Tolerance: Patient tolerated treatment well Patient left: in chair;with call bell/phone within reach;with chair alarm set Nurse Communication: Mobility status PT Visit Diagnosis: Other abnormalities of gait and mobility (R26.89);Muscle weakness (generalized) (M62.81)     Time: 7001-7494 PT Time Calculation (min) (ACUTE ONLY): 16 min  Charges:  $Gait Training: 8-22 mins                     Mackie Pai, SPT Acute Rehabilitation Services  Office: [phone]    Mackie Pai 07/06/2022, 2:56 PM

## 2022-07-06 NOTE — Evaluation (Signed)
Occupational Therapy Evaluation Patient Details Name: Jean Howell MRN: [id number] DOB: 12/13/1955 Today's Date: 07/06/2022   History of Present Illness 66 yo female s/p R TKA 07/05/20.  Pt with hx including but not limited to arthritis, HTN, and RA.   Clinical Impression   Patient is s/p R TKA surgery resulting in functional limitations due to the deficits listed below (see OT problem list). Pt currently requires (A) for LB dressing. Pt lives alone and would not have (A) during the day. Family could only check on patient after work hours. Pt could reach MOD I with continued therapy.  Patient will benefit from skilled OT acutely to increase independence and safety with ADLS to allow discharge SNF.       Recommendations for follow up therapy are one component of a multi-disciplinary discharge planning process, led by the attending physician.  Recommendations may be updated based on patient status, additional functional criteria and insurance authorization.   Follow Up Recommendations  Skilled nursing-short term rehab (<3 hours/day)    Assistance Recommended at Discharge Set up Supervision/Assistance  Patient can return home with the following A little help with walking and/or transfers;A little help with bathing/dressing/bathroom    Functional Status Assessment  Patient has had a recent decline in their functional status and demonstrates the ability to make significant improvements in function in a reasonable and predictable amount of time.  Equipment Recommendations  BSC/3in1;Other (comment) (RW)    Recommendations for Other Services       Precautions / Restrictions Precautions Precautions: Fall Restrictions Weight Bearing Restrictions: Yes RLE Weight Bearing: Weight bearing as tolerated      Mobility Bed Mobility               General bed mobility comments: oob on arrival    Transfers Overall transfer level: Needs assistance Equipment used: Rolling walker (2  wheels) Transfers: Sit to/from Stand Sit to Stand: Min guard           General transfer comment: cues for safety with RW during transfers for adls.      Balance Overall balance assessment: Needs assistance Sitting-balance support: No upper extremity supported, Feet supported Sitting balance-Leahy Scale: Good     Standing balance support: Bilateral upper extremity supported, No upper extremity supported Standing balance-Leahy Scale: Fair Standing balance comment: able to stand to perform pericare without RW                           ADL either performed or assessed with clinical judgement   ADL Overall ADL's : Needs assistance/impaired Eating/Feeding: Independent   Grooming: Wash/dry hands;Wash/dry face;Min guard;Standing   Upper Body Bathing: Set up;Sitting   Lower Body Bathing: Minimal assistance;Sit to/from stand;With adaptive equipment           Toilet Transfer: Min guard;BSC/3in1;Rolling walker (2 wheels)           Functional mobility during ADLs: Min guard;Rolling walker (2 wheels)       Vision Baseline Vision/History: 0 No visual deficits Vision Assessment?: No apparent visual deficits     Perception     Praxis      Pertinent Vitals/Pain Pain Assessment Pain Assessment: Faces Faces Pain Scale: Hurts a little bit Pain Location: R knee Pain Descriptors / Indicators: Discomfort, Guarding Pain Intervention(s): Monitored during session, Repositioned, Premedicated before session     Hand Dominance Right   Extremity/Trunk Assessment Upper Extremity Assessment Upper Extremity Assessment: Overall WFL for tasks assessed  Lower Extremity Assessment Lower Extremity Assessment: Defer to PT evaluation   Cervical / Trunk Assessment Cervical / Trunk Assessment: Normal   Communication Communication Communication: No difficulties   Cognition Arousal/Alertness: Awake/alert Behavior During Therapy: WFL for tasks assessed/performed Overall  Cognitive Status: Within Functional Limits for tasks assessed                                       General Comments  RA    Exercises     Shoulder Instructions      Home Living Family/patient expects to be discharged to:: Skilled nursing facility                                        Prior Functioning/Environment Prior Level of Function : Driving;Independent/Modified Independent                        OT Problem List: Impaired balance (sitting and/or standing);Decreased activity tolerance;Decreased safety awareness;Decreased knowledge of precautions;Decreased knowledge of use of DME or AE;Obesity      OT Treatment/Interventions: Self-care/ADL training;Therapeutic exercise;Energy conservation;DME and/or AE instruction;Manual therapy;Modalities;Therapeutic activities;Patient/family education;Balance training    OT Goals(Current goals can be found in the care plan section) Acute Rehab OT Goals Patient Stated Goal: to get therapy to be able to go home alone OT Goal Formulation: With patient Time For Goal Achievement: 07/20/22 Potential to Achieve Goals: Good  OT Frequency: Min 2X/week    Co-evaluation              AM-PAC OT "6 Clicks" Daily Activity     Outcome Measure Help from another person eating meals?: A Little Help from another person taking care of personal grooming?: A Little Help from another person toileting, which includes using toliet, bedpan, or urinal?: A Little Help from another person bathing (including washing, rinsing, drying)?: A Lot Help from another person to put on and taking off regular upper body clothing?: A Little Help from another person to put on and taking off regular lower body clothing?: A Lot 6 Click Score: 16   End of Session Equipment Utilized During Treatment: Gait belt;Rolling walker (2 wheels) Nurse Communication: Mobility status;Precautions  Activity Tolerance: Patient tolerated treatment  well Patient left: in chair;with call bell/phone within reach;with chair alarm set  OT Visit Diagnosis: Unsteadiness on feet (R26.81)                Time: 7564-3329 OT Time Calculation (min): 18 min Charges:  OT General Charges $OT Visit: 1 Visit OT Evaluation $OT Eval Moderate Complexity: 1 Mod   Brynn, OTR/L  Acute Rehabilitation Services Office: [phone] .   Jeri Modena 07/06/2022, 4:21 PM

## 2022-07-06 NOTE — Progress Notes (Signed)
Physical Therapy Treatment Patient Details Name: Jean Howell MRN: [id number] DOB: Jun 11, 1956 Today's Date: 07/06/2022   History of Present Illness Pt is 66 yo female s/p R TKA 07/05/20.  Pt with hx including but not limited to arthritis, HTN, and RA.    PT Comments    Pt demonstrating increased tolerance to activity this session with no complaints of nausea. ModI for bed mobility and min guard for transfers and ambulation of 23f with RW. Educated on HEP and importance of frequent mobilization while inpatient and upon discharge. PT will follow up this afternoon to continue to progress mobilization.      Recommendations for follow up therapy are one component of a multi-disciplinary discharge planning process, led by the attending physician.  Recommendations may be updated based on patient status, additional functional criteria and insurance authorization.  Follow Up Recommendations  Follow physician's recommendations for discharge plan and follow up therapies     Assistance Recommended at Discharge PRN  Patient can return home with the following A little help with walking and/or transfers;A little help with bathing/dressing/bathroom;Assistance with cooking/housework;Help with stairs or ramp for entrance   Equipment Recommendations  None recommended by PT    Recommendations for Other Services       Precautions / Restrictions Precautions Precautions: Fall Restrictions Weight Bearing Restrictions: Yes RLE Weight Bearing: Weight bearing as tolerated     Mobility  Bed Mobility Overal bed mobility: Modified Independent                  Transfers Overall transfer level: Needs assistance Equipment used: Rolling walker (2 wheels) Transfers: Sit to/from Stand Sit to Stand: Min guard           General transfer comment: min guard for safety    Ambulation/Gait Ambulation/Gait assistance: Min guard Gait Distance (Feet): 80 Feet Assistive device: Rolling walker (2  wheels) Gait Pattern/deviations: Step-through pattern, Decreased stride length, Decreased stance time - right Gait velocity: decreased Gait velocity interpretation: 1.31 - 2.62 ft/sec, indicative of limited community ambulator   General Gait Details: mildly antalgic gait with good sequencing and posture   Stairs             Wheelchair Mobility    Modified Rankin (Stroke Patients Only)       Balance Overall balance assessment: Needs assistance Sitting-balance support: No upper extremity supported, Feet supported Sitting balance-Leahy Scale: Good     Standing balance support: Bilateral upper extremity supported, No upper extremity supported Standing balance-Leahy Scale: Fair Standing balance comment: able to stand to perform pericare without RW                            Cognition Arousal/Alertness: Awake/alert Behavior During Therapy: WFL for tasks assessed/performed Overall Cognitive Status: Within Functional Limits for tasks assessed                                          Exercises Total Joint Exercises Quad Sets: AROM, Right, 10 reps, Supine Heel Slides: AROM, Right, 10 reps, Seated Straight Leg Raises: AROM, Right, 10 reps, Supine Long Arc Quad: AROM, Right, 10 reps, Seated Goniometric ROM: 4-100    General Comments        Pertinent Vitals/Pain Pain Assessment Pain Assessment: Faces Faces Pain Scale: Hurts a little bit Pain Location: R knee Pain Descriptors / Indicators: Discomfort, Guarding  Pain Intervention(s): Limited activity within patient's tolerance, Monitored during session    Home Living                          Prior Function            PT Goals (current goals can now be found in the care plan section) Acute Rehab PT Goals Patient Stated Goal: rehab then home PT Goal Formulation: With patient Time For Goal Achievement: 07/19/22 Potential to Achieve Goals: Good Progress towards PT goals:  Progressing toward goals    Frequency    7X/week      PT Plan Current plan remains appropriate    Co-evaluation              AM-PAC PT "6 Clicks" Mobility   Outcome Measure  Help needed turning from your back to your side while in a flat bed without using bedrails?: None Help needed moving from lying on your back to sitting on the side of a flat bed without using bedrails?: None Help needed moving to and from a bed to a chair (including a wheelchair)?: A Little Help needed standing up from a chair using your arms (e.g., wheelchair or bedside chair)?: A Little Help needed to walk in hospital room?: A Little Help needed climbing 3-5 steps with a railing? : A Little 6 Click Score: 20    End of Session Equipment Utilized During Treatment: Gait belt Activity Tolerance: Patient tolerated treatment well Patient left: in chair;with call bell/phone within reach;with chair alarm set Nurse Communication: Mobility status PT Visit Diagnosis: Other abnormalities of gait and mobility (R26.89);Muscle weakness (generalized) (M62.81)     Time: 3235-5732 PT Time Calculation (min) (ACUTE ONLY): 26 min  Charges:  $Therapeutic Exercise: 8-22 mins $Therapeutic Activity: 8-22 mins                     Mackie Pai, SPT Acute Rehabilitation Services  Office: [phone]    Mackie Pai 07/06/2022, 9:13 AM

## 2022-07-06 NOTE — NC FL2 (Signed)
[place] LEVEL OF CARE SCREENING TOOL     IDENTIFICATION  Patient Name: Jean Howell Birthdate: 03-31-1956 Sex: female Admission Date (Current Location): 07/05/2022  [facility] and [place] Number:  Herbalist and Address:  [facility], [place] [address] [place], [place] [address]      Provider Number: [id number]  Attending Physician Name and Address:  Leandrew Koyanagi, MD  Relative Name and Phone Number:  lowe,hester [phone])   [phone]    Current Level of Care: Hospital Recommended Level of Care: [place] Prior Approval Number:    Date Approved/Denied:   PASRR Number: [id number]  Discharge Plan: SNF    Current Diagnoses: Patient Active Problem List   Diagnosis Date Noted   Status post total right knee replacement 07/05/2022   Primary osteoarthritis of right knee 06/10/2022   Osteopenia of neck of right femur 03/16/2022   Full code status 06/16/2020   Impaired mobility and endurance 06/16/2020   Sciatica of left side 06/16/2020   Rheumatoid arthritis involving multiple sites with positive rheumatoid factor ([place]) 03/06/2019   Primary osteoarthritis of both knees 03/06/2019   Former smoker 03/06/2019   ANA positive 02/12/2019   Family history of thyroid disease 01/08/2019   Morbid obesity ([place]) 01/08/2019   Essential hypertension 01/08/2019   Family history of colon cancer 01/08/2019    Orientation RESPIRATION BLADDER Height & Weight     Self, Time, Situation, Place  Normal Continent Weight: 218 lb (98.9 kg) Height:  '5\' 4"'$  (162.6 cm)  BEHAVIORAL SYMPTOMS/MOOD NEUROLOGICAL BOWEL NUTRITION STATUS        Diet (see d/c summary)  AMBULATORY STATUS COMMUNICATION OF NEEDS Skin   Limited Assist Verbally Surgical wounds (R knee incision)                       Personal Care Assistance Level of Assistance  Bathing, Dressing, Feeding Bathing Assistance: Limited assistance Feeding assistance:  Independent Dressing Assistance: Limited assistance     Functional Limitations Info  Sight, Hearing, Speech Sight Info: Adequate Hearing Info: Adequate Speech Info: Adequate    SPECIAL CARE FACTORS FREQUENCY  PT (By licensed PT), OT (By licensed OT)     PT Frequency: 5x/ week OT Frequency: 5x/ week            Contractures Contractures Info: Not present    Additional Factors Info  Code Status, Allergies Code Status Info: Full Allergies Info: NKA           Current Medications (07/06/2022):  This is the current hospital active medication list Current Facility-Administered Medications  Medication Dose Route Frequency Provider Last Rate Last Admin   0.9 %  sodium chloride infusion   Intravenous Continuous Leandrew Koyanagi, MD 75 mL/hr at 07/06/22 0253 New Bag at 07/06/22 0253   acetaminophen (TYLENOL) tablet 325-650 mg  325-650 mg Oral Q6H PRN Leandrew Koyanagi, MD       amitriptyline (ELAVIL) tablet 25 mg  25 mg Oral QHS Leandrew Koyanagi, MD   25 mg at 07/05/22 2110   aspirin chewable tablet 81 mg  81 mg Oral BID Leandrew Koyanagi, MD   81 mg at 07/06/22 1047   docusate sodium (COLACE) capsule 100 mg  100 mg Oral BID Leandrew Koyanagi, MD   100 mg at 07/06/22 1047   ferrous sulfate tablet 325 mg  325 mg Oral TID PC Leandrew Koyanagi, MD   325 mg at  07/06/22 1438   gabapentin (NEURONTIN) capsule 400 mg  400 mg Oral QHS Leandrew Koyanagi, MD   400 mg at 07/05/22 2110   HYDROmorphone (DILAUDID) injection 0.5-1 mg  0.5-1 mg Intravenous Q4H PRN Leandrew Koyanagi, MD       menthol-cetylpyridinium (CEPACOL) lozenge 3 mg  1 lozenge Oral PRN Leandrew Koyanagi, MD       Or   phenol (CHLORASEPTIC) mouth spray 1 spray  1 spray Mouth/Throat PRN Leandrew Koyanagi, MD       methocarbamol (ROBAXIN) tablet 500 mg  500 mg Oral Q6H PRN Leandrew Koyanagi, MD   500 mg at 07/05/22 1250   Or   methocarbamol (ROBAXIN) 500 mg in dextrose 5 % 50 mL IVPB  500 mg Intravenous Q6H PRN Leandrew Koyanagi, MD       metoCLOPramide (REGLAN) tablet 5-10  mg  5-10 mg Oral Q8H PRN Leandrew Koyanagi, MD       Or   metoCLOPramide (REGLAN) injection 5-10 mg  5-10 mg Intravenous Q8H PRN Leandrew Koyanagi, MD   10 mg at 07/05/22 1644   ondansetron (ZOFRAN) tablet 4 mg  4 mg Oral Q6H PRN Leandrew Koyanagi, MD       Or   ondansetron [facility]) injection 4 mg  4 mg Intravenous Q6H PRN Leandrew Koyanagi, MD   4 mg at 07/06/22 0522   oxyCODONE (Oxy IR/ROXICODONE) immediate release tablet 10-15 mg  10-15 mg Oral Q4H PRN Leandrew Koyanagi, MD       oxyCODONE (Oxy IR/ROXICODONE) immediate release tablet 5-10 mg  5-10 mg Oral Q4H PRN Leandrew Koyanagi, MD       oxyCODONE (OXYCONTIN) 12 hr tablet 10 mg  10 mg Oral Q12H Leandrew Koyanagi, MD   10 mg at 07/06/22 1047   pneumococcal 20-valent conjugate vaccine (PREVNAR 20) injection 0.5 mL  0.5 mL Intramuscular Tomorrow-1000 Leandrew Koyanagi, MD         Discharge Medications: Please see discharge summary for a list of discharge medications.  Relevant Imaging Results:  Relevant Lab Results:   Additional Information SSN:  [id number]; COVID vax x4;  5'4" 218lbs  Jean Howell, LCSWA

## 2022-07-06 NOTE — Telephone Encounter (Signed)
Next Visit: 08/17/2022   Last Visit: 03/17/2022   Last Fill: 04/08/2022   KT:[id number] arthritis involving multiple sites with positive rheumatoid factor    Current Dose per office note 03/17/2022: Enbrel 50 mg subcutaneous injections once weekly   Labs: 05/31/2022 Creatinine is elevated, most likely due to diuretic use.  CBC is normal.   TB Gold: 03/01/2022, negative    Okay to refill Enbrel?

## 2022-07-06 NOTE — TOC Initial Note (Signed)
Transition of Care [facility]) - Initial/Assessment Note    Patient Details  Name: Jean Howell MRN: [id number] Date of Birth: Sep 21, 1956  [facility]) CM/SW Contact:    Milinda Antis, [place] Phone Number: 07/06/2022, 2:59 PM  Clinical Narrative:                 CSW received consult for possible SNF placement at time of discharge. CSW spoke with patient. Patient reported not having any family to assist at home.  Patient expressed understanding of PT recommendation and is agreeable to SNF placement at time of discharge. Patient reports preference for a facility in [place], [place] discussed insurance authorization process and will provide Medicare SNF ratings list. CSW will send out referrals for review and provide bed offers as available.   Skilled Nursing Rehab Facilities-   [email or web address]   Ratings out of 5 stars (the highest)   Name Address  Phone # [place] Inspection Overall  [facility] [address], [place] '5 5 2 4  '$ [facility]  [address] [place], [place] [phone] '4 2 5 5  '$ [facility], [place] '1 1 1 1  '$ [facility] [place], [place] '2 2 4 4  '$ [facility] [address], [place] '1 1 2 1  '$ [place] [address] [place] [phone] '3 2 4 4  '$ [facility] [address], [place] '5 1 3 3  '$ [facility] [address], [place] '5 2 3 4  '$ [address] ([place]) [place], [place] [phone] '4 1 2 1  '$ [facility] [phone] [address], [place] [phone] '4 1 1 1  '$ [facility] [address], [facility] [phone] '3 1 2 1  '$ [facility] ([place]) [place], [place] [phone] '4 1 1 1  '$ [facility] Flock [address] [phone] '4 2 4 4          '$ [facility] '4 1 3 2  '$ [facility] [place] [address] [place] '3 1 5 4  '$ [address], [place], [place] [phone] '1 1 1 1  '$ [facility] [address] [employer] [phone] '2 2 3 3          '$ [address] (no [facility]) [place] [address], [place] [phone] '5 5 5 5  '$ [facility] (No Hum[employer]) [address], [place] '4 1 4 3  '$ [facility] (No [employer]) [place], [place] [phone] '5 5 5 5  '$ [facility] [place] [address] [employer] [phone] '2 3 5 5  '$ [facility] [address], [place] '1 1 2 1  '$ [facility] [address], [place] [phone] '3 1 1 1  '$ [facility] [place], [place] '5 2 4 5  '$ [facility]  [address], [place] '2 2 1 1  '$ [facility] [address] [place] '3 2 1 1  '$ [facility] [place], [place] '2 2 2 2          '$ [facility] [address], [place] '1 1 1 1  '$ [place] [address] [place]  [phone] '2 4 2 2  '$ [facility] [place] [address], [place] [phone] 4 2 3  [place], [place] '2 1 1 1  '$ [place] (No Humana) [address], [place] [phone] '2 2 3 3  '$ [facility] [address] [place] [phone] '2 1 1 1          '$ [facility] [place], [place] '5 4 5 5  '$ [facility])  [address], [place] '2 1 2 1  '$ [facility]) [place] [address] [place] '3 1 4 3  '$ [place] [address], [place] '3 3 4 4  '$ [address] [place], [place] '2 3 1 1  '$ [place] [facility]) [address] [place] [phone] '2 1 4 3      '$ Expected Discharge Plan: [place] Barriers to Discharge: Continued Medical Work up, SNF Pending bed offer, Insurance Authorization   Patient Goals and CMS Choice Patient states their goals for this hospitalization and ongoing recovery are:: going to a rehab center CMS Medicare.gov Compare Post Acute Care list provided to:: Patient Choice offered to / list presented to : Patient  Expected Discharge Plan and Services Expected Discharge Plan: [place] arrangements for the past 2 months: Single Family Home                                      Prior Living Arrangements/Services Living arrangements for the past 2 months: Single Family Home Lives with:: Self Patient language and need for interpreter reviewed:: Yes Do you feel safe going back to the place where you live?: Yes      Need for Family Participation in Patient Care: Yes (Comment) Care giver support system in place?: No (comment)   Criminal Activity/Legal Involvement Pertinent to Current Situation/Hospitalization: No - Comment as needed  Activities of Daily Living Home Assistive Devices/Equipment: Brace (specify type), Cane (specify quad or straight), Walker (specify type), Shower chair with back, Raised toilet seat with rails ADL Screening (condition at time of admission) Patient's cognitive ability adequate to safely complete daily activities?: Yes Is the patient deaf or have difficulty hearing?: No Does the patient have difficulty seeing, even when wearing glasses/contacts?: No Does the patient have difficulty concentrating, remembering, or making decisions?: No Patient able to express need for assistance with ADLs?: Yes Does the patient have difficulty dressing or bathing?: No Independently performs ADLs?: Yes (appropriate for developmental age) Does the patient have difficulty walking or climbing stairs?: Yes Weakness of Legs: Both Weakness of Arms/Hands:  Both  Permission Sought/Granted Permission sought to share information with : Other (comment) (CSW) Permission granted to share information with : Yes, Verbal Permission Granted     Permission granted to share info w AGENCY: SNF        Emotional Assessment Appearance:: Appears stated age Attitude/Demeanor/Rapport: Engaged Affect (typically observed): Accepting Orientation: : Oriented to Situation, Oriented to  Time, Oriented to Place, Oriented to Self Alcohol / Substance Use: Not Applicable Psych Involvement: No (comment)  Admission diagnosis:  Status post total right knee replacement [Z96.651] Patient Active Problem List   Diagnosis Date Noted   Status post total right knee replacement 07/05/2022   Primary osteoarthritis of right knee 06/10/2022   Osteopenia of neck of right femur 03/16/2022   Full code status 06/16/2020   Impaired mobility and  endurance 06/16/2020   Sciatica of left side 06/16/2020   Rheumatoid arthritis involving multiple sites with positive rheumatoid factor ([place]) 03/06/2019   Primary osteoarthritis of both knees 03/06/2019   Former smoker 03/06/2019   ANA positive 02/12/2019   Family history of thyroid disease 01/08/2019   Morbid obesity ([place]) 01/08/2019   Essential hypertension 01/08/2019   Family history of colon cancer 01/08/2019   PCP:  [name], DO Pharmacy:   [facility], [place] [address] Phone: [phone] Fax: [phone]     Social Determinants of Health (SDOH) Interventions    Readmission Risk Interventions     No data to display

## 2022-07-07 ENCOUNTER — Encounter (HOSPITAL_COMMUNITY): Payer: Self-pay | Admitting: Orthopaedic Surgery

## 2022-07-07 NOTE — Progress Notes (Signed)
Subjective: 2 Days Post-Op Procedure(s) (LRB): RIGHT TOTAL KNEE ARTHROPLASTY (Right) Patient reports pain as mild.  Still feeling well with the exception of a little nausea this m.   Objective: Vital signs in last 24 hours: Temp:  [97.5 F (36.4 C)-98.6 F (37 C)] 98.3 F (36.8 C) (08/02 0407) Pulse Rate:  [69-76] 70 (08/02 0407) Resp:  [17-19] 17 (08/02 0407) BP: (137-173)/(72-77) 173/77 (08/02 0407) SpO2:  [100 %] 100 % (08/02 0407)  Intake/Output from previous day: 08/01 0701 - 08/02 0700 In: 1351.7 [P.O.:720; I.V.:631.7] Out: -  Intake/Output this shift: No intake/output data recorded.  Recent Labs    07/06/22 0602  HGB 10.1*   Recent Labs    07/06/22 0602  WBC 9.5  RBC 3.43*  HCT 29.8*  PLT 230   No results for input(s): "NA", "K", "CL", "CO2", "BUN", "CREATININE", "GLUCOSE", "CALCIUM" in the last 72 hours. No results for input(s): "LABPT", "INR" in the last 72 hours.  Neurologically intact Neurovascular intact Sensation intact distally Intact pulses distally Dorsiflexion/Plantar flexion intact Incision: scant drainage No cellulitis present Compartment soft   Assessment/Plan: 2 Days Post-Op Procedure(s) (LRB): RIGHT TOTAL KNEE ARTHROPLASTY (Right) Advance diet Up with therapy D/C IV fluids WBAT RLE ABLA-mild and stable D/c dispo pending- patient is doing well with PT, but has absolutely no support at home and no neighbors/friends to help her out.  Will likely need to be d/c to SNF   Anticipated LOS equal to or greater than 2 midnights due to - Age 68 and older with one or more of the following:  - Obesity  - Expected need for hospital services (PT, OT, Nursing) required for safe  discharge  - Anticipated need for postoperative skilled nursing care or inpatient rehab  - Active co-morbidities: None OR   - Unanticipated findings during/Post Surgery: Slow post-op progression: GI, pain control, mobility  - Patient is a high risk of re-admission due  to: None   Jean Howell 07/07/2022, 7:55 AM

## 2022-07-07 NOTE — TOC Progression Note (Addendum)
Transition of Care [facility]) - Initial/Assessment Note    Patient Details  Name: Jean Howell MRN: [id number] Date of Birth: Oct 06, 1956  Transition of Care [facility]) CM/SW Contact:    Milinda Antis, [place] Phone Number: 07/07/2022, 2:21 PM  Clinical Narrative:                 CSW met with the patient at bedside to present bed offers.  The patient choose [facility].  CSW contacted Alyse Low in admissions at [phone] to accept bed offer.  There was no answer.  CSW left a VM requesting a returned call.    CSW requested that insurance auth be started with facility pending.    1500-  CSW received a returned call from [place] with [facility].  The facility can accept the patient.  Insurance Josem Kaufmann will be updated to show facility.   Expected Discharge Plan: Skilled Nursing Facility Barriers to Discharge: Continued Medical Work up, SNF Pending bed offer, Insurance Authorization   Patient Goals and CMS Choice Patient states their goals for this hospitalization and ongoing recovery are:: going to a rehab center [employer] Compare Post Acute Care list provided to:: Patient Choice offered to / list presented to : Patient  Expected Discharge Plan and Services Expected Discharge Plan: [place] arrangements for the past 2 months: Single Family Home                 DME Arranged:  (CPM and Rw were delivered to patient's home prior to surgery) DME Agency: [employer]       HH Arranged: PT [place] Agency: [place] (Anticipated discharge to SNF, but this is unknown at this time. Referral to HHPT made in case patient does go home instead.)        Prior Living Arrangements/Services Living arrangements for the past 2 months: [place] with:: Self Patient language and need for interpreter reviewed:: Yes Do you feel safe going back to the place where you live?: Yes      Need for Family Participation in Patient Care: Yes (Comment) Care  giver support system in place?: No (comment)   Criminal Activity/Legal Involvement Pertinent to Current Situation/Hospitalization: No - Comment as needed  Activities of Daily Living Home Assistive Devices/Equipment: Brace (specify type), Cane (specify quad or straight), Walker (specify type), Shower chair with back, Raised toilet seat with rails ADL Screening (condition at time of admission) Patient's cognitive ability adequate to safely complete daily activities?: Yes Is the patient deaf or have difficulty hearing?: No Does the patient have difficulty seeing, even when wearing glasses/contacts?: No Does the patient have difficulty concentrating, remembering, or making decisions?: No Patient able to express need for assistance with ADLs?: Yes Does the patient have difficulty dressing or bathing?: No Independently performs ADLs?: Yes (appropriate for developmental age) Does the patient have difficulty walking or climbing stairs?: Yes Weakness of Legs: Both Weakness of Arms/Hands: Both  Permission Sought/Granted Permission sought to share information with : Other (comment) (CSW) Permission granted to share information with : Yes, Verbal Permission Granted     Permission granted to share info w AGENCY: SNF        Emotional Assessment Appearance:: Appears stated age Attitude/Demeanor/Rapport: Engaged Affect (typically observed): Accepting Orientation: : Oriented to Situation, Oriented to  Time, Oriented to Place, Oriented to Self Alcohol / Substance Use: Not Applicable Psych Involvement: No (comment)  Admission diagnosis:  Status post total right knee replacement [Z96.651]  Patient Active Problem List   Diagnosis Date Noted   Status post total right knee replacement 07/05/2022   Primary osteoarthritis of right knee 06/10/2022   Osteopenia of neck of right femur 03/16/2022   Full code status 06/16/2020   Impaired mobility and endurance 06/16/2020   Sciatica of left side 06/16/2020    Rheumatoid arthritis involving multiple sites with positive rheumatoid factor ([place]) 03/06/2019   Primary osteoarthritis of both knees 03/06/2019   Former smoker 03/06/2019   ANA positive 02/12/2019   Family history of thyroid disease 01/08/2019   Morbid obesity ([place]) 01/08/2019   Essential hypertension 01/08/2019   Family history of colon cancer 01/08/2019   PCP:  [name], DO Pharmacy:   [facility] [address] [place] [address] Phone: [phone] Fax: [phone]     Social Determinants of Health (SDOH) Interventions    Readmission Risk Interventions     No data to display

## 2022-07-07 NOTE — Progress Notes (Signed)
Physical Therapy Treatment Patient Details Name: Jean Howell MRN: [id number] DOB: 1956-06-10 Today's Date: 07/07/2022   History of Present Illness 66 yo female s/p R TKA 07/05/20.  Pt with hx including but not limited to arthritis, HTN, and RA.    PT Comments    Pt continues to be motivated to participate with therapy and reports good adherence to HEP. ModI for transfers and supervision for ambulation of hallway distances with RW. Pt educated on importance of continuing HEP upon discharge to improve strength and ROM. PT will continue to follow while inpatient.     Recommendations for follow up therapy are one component of a multi-disciplinary discharge planning process, led by the attending physician.  Recommendations may be updated based on patient status, additional functional criteria and insurance authorization.  Follow Up Recommendations  Follow physician's recommendations for discharge plan and follow up therapies (pt is planing for SNF)     Assistance Recommended at Discharge PRN  Patient can return home with the following A little help with bathing/dressing/bathroom;Assistance with cooking/housework;Assist for transportation;Help with stairs or ramp for entrance   Equipment Recommendations  None recommended by PT    Recommendations for Other Services       Precautions / Restrictions Precautions Precautions: Fall Restrictions Weight Bearing Restrictions: Yes RLE Weight Bearing: Weight bearing as tolerated     Mobility  Bed Mobility Overal bed mobility: Modified Independent                  Transfers Overall transfer level: Modified independent                      Ambulation/Gait Ambulation/Gait assistance: Supervision Gait Distance (Feet): 200 Feet Assistive device: Rolling walker (2 wheels) Gait Pattern/deviations: Step-to pattern, Decreased stance time - right, Antalgic Gait velocity: decreased Gait velocity interpretation: 1.31 - 2.62  ft/sec, indicative of limited community ambulator   General Gait Details: step-to pattern this session but maintain good step length, balance, and posture throughout   Stairs             Wheelchair Mobility    Modified Rankin (Stroke Patients Only)       Balance Overall balance assessment: Needs assistance Sitting-balance support: No upper extremity supported, Feet supported Sitting balance-Leahy Scale: Good     Standing balance support: Bilateral upper extremity supported, No upper extremity supported Standing balance-Leahy Scale: Fair                              Cognition Arousal/Alertness: Awake/alert Behavior During Therapy: WFL for tasks assessed/performed Overall Cognitive Status: Within Functional Limits for tasks assessed                                          Exercises Total Joint Exercises Quad Sets: AROM, Right, 5 reps, Supine Heel Slides: AROM, Right, 10 reps, Supine, Seated Hip ABduction/ADduction: AROM, Right, 10 reps, Supine Goniometric ROM: 4-92    General Comments        Pertinent Vitals/Pain Pain Assessment Pain Assessment: No/denies pain    Home Living                          Prior Function            PT Goals (current goals can now be found  in the care plan section) Acute Rehab PT Goals Patient Stated Goal: rehab then home PT Goal Formulation: With patient Time For Goal Achievement: 07/19/22 Potential to Achieve Goals: Good Progress towards PT goals: Progressing toward goals    Frequency    7X/week      PT Plan Current plan remains appropriate    Co-evaluation              AM-PAC PT "6 Clicks" Mobility   Outcome Measure  Help needed turning from your back to your side while in a flat bed without using bedrails?: None Help needed moving from lying on your back to sitting on the side of a flat bed without using bedrails?: None Help needed moving to and from a bed to a  chair (including a wheelchair)?: None Help needed standing up from a chair using your arms (e.g., wheelchair or bedside chair)?: None Help needed to walk in hospital room?: A Little Help needed climbing 3-5 steps with a railing? : A Little 6 Click Score: 22    End of Session   Activity Tolerance: Patient tolerated treatment well Patient left: in chair;with call bell/phone within reach;with chair alarm set;with nursing/sitter in room Nurse Communication: Mobility status PT Visit Diagnosis: Other abnormalities of gait and mobility (R26.89);Muscle weakness (generalized) (M62.81)     Time: 4709-6283 PT Time Calculation (min) (ACUTE ONLY): 17 min  Charges:  $Therapeutic Activity: 8-22 mins                     Mackie Pai, SPT Acute Rehabilitation Services  Office: [phone]    Mackie Pai 07/07/2022, 9:50 AM

## 2022-07-07 NOTE — Care Plan (Signed)
Ortho Bundle Case Management Note  Patient Details  Name: Jean Howell MRN: [id number] Date of Birth: 05/07/1956  [facility] RNCM call to patient and discussed her upcoming Right total knee arthroplasty with Dr. Erlinda Hong on 07/05/22. She is an Ortho bundle patient through [facility] and is agreeable to case management. She lives alone and states she has no family or friends that can check on her or that can assist after surgery. She has a niece in the [place] area that would be able to come by from time to time due to work, but cannot provide care. She anticipates going to a SNF for STR at discharge. Dr. Erlinda Hong is aware. She mentioned wanting to go to a facility in [place] or [place] if possible. Discussed [facility] or [facility] She was provided a home CPM and RW by [employer] prior to surgery. SNF is pending insurance authorization we are aware. Patient has done well with therapy yesterday and today. Referral also made to [facility] for PT and possibly an aide if needed, in case patient's insurance denies SNF placement. Discussed this with patient today. Reviewed post op care instructions.  Will continue to follow for needs.                 DME Arranged:   (CPM and Rw were delivered to patient's home prior to surgery) DME Agency:  [employer]  HH Arranged:  PT [place] Agency:  [facility] (Anticipated discharge to SNF, but this is unknown at this time. Referral to HHPT made in case patient does go home instead.)  Additional Comments: Please contact me with any questions of if this plan should need to change.  Jamse Arn, RN, BSN, [employer]  [phone] 07/07/2022, 9:28 AM

## 2022-07-07 NOTE — Telephone Encounter (Signed)
Have spoken with patient prior to surgery.

## 2022-07-08 NOTE — Progress Notes (Signed)
Will plan for patient to d/c home with hhpt tomorrow

## 2022-07-08 NOTE — Progress Notes (Signed)
Physical Therapy Treatment Patient Details Name: Jean Howell MRN: [id number] DOB: 02/26/1956 Today's Date: 07/08/2022   History of Present Illness 66 yo female s/p R TKA 07/05/20.  Pt with hx including but not limited to arthritis, HTN, and RA.    PT Comments    Began session with supine lower extremity exercises to improve R lower extremity ROM and strength. Pt was a little more lethargic this morning having just finished breakfast and even reported feeling "swimmy-headed" once she got to the doorway of the room, thus cued pt to return to the bed for safety. BP supine in bed end of session was 104/65, notified RN. Will continue to follow acutely. Current recommendations remain appropriate.     Recommendations for follow up therapy are one component of a multi-disciplinary discharge planning process, led by the attending physician.  Recommendations may be updated based on patient status, additional functional criteria and insurance authorization.  Follow Up Recommendations  Follow physician's recommendations for discharge plan and follow up therapies (pt is planning for SNF)     Assistance Recommended at Discharge PRN  Patient can return home with the following A little help with bathing/dressing/bathroom;Assistance with cooking/housework;Assist for transportation;Help with stairs or ramp for entrance   Equipment Recommendations  None recommended by PT    Recommendations for Other Services       Precautions / Restrictions Precautions Precautions: Fall Precaution Comments: watch BP Restrictions Weight Bearing Restrictions: Yes RLE Weight Bearing: Weight bearing as tolerated     Mobility  Bed Mobility Overal bed mobility: Needs Assistance Bed Mobility: Supine to Sit, Sit to Supine     Supine to sit: Modified independent (Device/Increase time), HOB elevated Sit to supine: Min assist, HOB elevated   General bed mobility comments: MinA to assist R leg back up onto bed.     Transfers Overall transfer level: Needs assistance Equipment used: Rolling walker (2 wheels) Transfers: Sit to/from Stand Sit to Stand: Supervision           General transfer comment: Supervision for safety as pt was feeling lethargic initially    Ambulation/Gait Ambulation/Gait assistance: Min guard Gait Distance (Feet): 40 Feet Assistive device: Rolling walker (2 wheels) Gait Pattern/deviations: Decreased stance time - right, Antalgic, Step-through pattern, Trunk flexed Gait velocity: decreased Gait velocity interpretation: <1.31 ft/sec, indicative of household ambulator   General Gait Details: Pt with improved step-through, but continues to display antalgic gait pattern with flexed posture. Cues to improve upright posture with momentary success. Pt limited in further mobility by feeling "swimmy headed"".   Stairs             Wheelchair Mobility    Modified Rankin (Stroke Patients Only)       Balance Overall balance assessment: Needs assistance Sitting-balance support: No upper extremity supported, Feet supported Sitting balance-Leahy Scale: Good     Standing balance support: Bilateral upper extremity supported, During functional activity Standing balance-Leahy Scale: Poor Standing balance comment: Reliant on RW                            Cognition Arousal/Alertness: Awake/alert, Lethargic, Suspect due to medications Behavior During Therapy: WFL for tasks assessed/performed Overall Cognitive Status: Within Functional Limits for tasks assessed                                 General Comments: Pt lethargic when supine, often drifting off  during conversation. She reports she thinks it was due to meds and just eating.        Exercises Total Joint Exercises Ankle Circles/Pumps: AROM, Both, 10 reps, Supine Quad Sets: AROM, Right, Supine, 10 reps Heel Slides: Right, 10 reps, Supine, AAROM    General Comments General comments  (skin integrity, edema, etc.): BP 104/65 once supine end of session      Pertinent Vitals/Pain Pain Assessment Pain Assessment: Faces Faces Pain Scale: Hurts little more Pain Location: R knee Pain Descriptors / Indicators: Discomfort, Grimacing, Operative site guarding Pain Intervention(s): Limited activity within patient's tolerance, Monitored during session, Repositioned, Premedicated before session    Home Living                          Prior Function            PT Goals (current goals can now be found in the care plan section) Acute Rehab PT Goals Patient Stated Goal: to improve PT Goal Formulation: With patient Time For Goal Achievement: 07/19/22 Potential to Achieve Goals: Good Progress towards PT goals: Progressing toward goals    Frequency    7X/week      PT Plan Current plan remains appropriate    Co-evaluation              AM-PAC PT "6 Clicks" Mobility   Outcome Measure  Help needed turning from your back to your side while in a flat bed without using bedrails?: None Help needed moving from lying on your back to sitting on the side of a flat bed without using bedrails?: None Help needed moving to and from a bed to a chair (including a wheelchair)?: A Little Help needed standing up from a chair using your arms (e.g., wheelchair or bedside chair)?: A Little Help needed to walk in hospital room?: A Little Help needed climbing 3-5 steps with a railing? : A Little 6 Click Score: 20    End of Session Equipment Utilized During Treatment: Gait belt Activity Tolerance: Patient tolerated treatment well;Other (comment) (limited by feeling "swimmy-headed") Patient left: with call bell/phone within reach;in bed;with bed alarm set Nurse Communication: Mobility status;Other (comment) (BP) PT Visit Diagnosis: Other abnormalities of gait and mobility (R26.89);Muscle weakness (generalized) (M62.81);Unsteadiness on feet (R26.81);Difficulty in walking, not  elsewhere classified (R26.2);Pain Pain - Right/Left: Right Pain - part of body: Knee     Time: 8242-3536 PT Time Calculation (min) (ACUTE ONLY): 18 min  Charges:  $Therapeutic Activity: 8-22 mins                     Moishe Spice, PT, DPT Acute Rehabilitation Services  Office: [phone]    Orvan Falconer 07/08/2022, 11:06 AM

## 2022-07-08 NOTE — Plan of Care (Signed)
Pt tolerate sitting up

## 2022-07-08 NOTE — Discharge Summary (Addendum)
Patient ID: Jean Howell MRN: [id number] DOB/AGE: [birth year] 66 y.o.  Admit date: 07/05/2022 Discharge date: 07/09/2022  Admission Diagnoses:  Principal Problem:   Primary osteoarthritis of right knee Active Problems:   Status post total right knee replacement   Discharge Diagnoses:  Same  Past Medical History:  Diagnosis Date   Arthritis    Depression    Hypertension    Rheumatoid arthritis ([place])     Surgeries: Procedure(s): RIGHT TOTAL KNEE ARTHROPLASTY on 07/05/2022   Consultants:   Discharged Condition: Improved  Hospital Course: Jean Howell is an 66 y.o. female who was admitted 07/05/2022 for operative treatment ofPrimary osteoarthritis of right knee. Patient has severe unremitting pain that affects sleep, daily activities, and work/hobbies. After pre-op clearance the patient was taken to the operating room on 07/05/2022 and underwent  Procedure(s): RIGHT TOTAL KNEE ARTHROPLASTY.    Patient was given perioperative antibiotics:  Anti-infectives (From admission, onward)    Start     Dose/Rate Route Frequency Ordered Stop   07/05/22 1600  ceFAZolin (ANCEF) IVPB 2g/100 mL premix        2 g 200 mL/hr over 30 Minutes Intravenous Every 6 hours 07/05/22 1229 07/05/22 2143   07/05/22 0942  vancomycin (VANCOCIN) powder  Status:  Discontinued          As needed 07/05/22 0952 07/05/22 1122   07/05/22 0645  ceFAZolin (ANCEF) IVPB 2g/100 mL premix        2 g 200 mL/hr over 30 Minutes Intravenous On call to O.R. 07/05/22 0086 07/05/22 0911   07/05/22 7619  ceFAZolin (ANCEF) 2-4 GM/100ML-% IVPB       Note to Pharmacy: [place]: cabinet override      07/05/22 5093 07/05/22 0914        Patient was given sequential compression devices, early ambulation, and chemoprophylaxis to prevent DVT.  Patient benefited maximally from hospital stay and there were no complications.    Recent vital signs: Patient Vitals for the past 24 hrs:  BP Temp Temp src Pulse Resp SpO2   07/09/22 0548 109/69 98.1 F (36.7 C) Oral 88 19 100 %  07/08/22 2040 (!) 137/59 98 F (36.7 C) Oral 98 18 100 %  07/08/22 1402 126/77 98.3 F (36.8 C) -- 100 18 100 %     Recent laboratory studies:  No results for input(s): "WBC", "HGB", "HCT", "PLT", "NA", "K", "CL", "CO2", "BUN", "CREATININE", "GLUCOSE", "INR", "CALCIUM" in the last 72 hours.  Invalid input(s): "PT", "2"    Discharge Medications:   Allergies as of 07/09/2022   No Known Allergies      Medication List     STOP taking these medications    VITAMIN B 12 PO   Vitamin D-3 125 MCG (5000 UT) Tabs       TAKE these medications    amitriptyline 25 MG tablet Commonly known as: ELAVIL Take 1 tablet (25 mg total) by mouth at bedtime.   aspirin EC 81 MG tablet Take 1 tablet (81 mg total) by mouth 2 (two) times daily. To be taken after surgery to prevent blood clots.  Swallow whole.   Belsomra 10 MG Tabs Generic drug: Suvorexant Take 10 mg by mouth at bedtime as needed (sleep).   CAL-MAG-ZINC PO Take 1 tablet by mouth daily.   docusate sodium 100 MG capsule Commonly known as: Colace Take 1 capsule (100 mg total) by mouth daily as needed.   Enbrel Mini 50 MG/ML Soct Generic drug: Etanercept Inject 50 mg into  the skin once a week.   fluticasone 50 MCG/ACT nasal spray Commonly known as: FLONASE Place 2 sprays into both nostrils daily. What changed:  when to take this reasons to take this   gabapentin 400 MG capsule Commonly known as: NEURONTIN TAKE ONE CAPSULE BY MOUTH AT BEDTIME   leflunomide 20 MG tablet Commonly known as: ARAVA TAKE ONE (1) TABLET EACH DAY   losartan-hydrochlorothiazide 100-12.5 MG tablet Commonly known as: HYZAAR TAKE ONE (1) TABLET EACH DAY   methocarbamol 750 MG tablet Commonly known as: Robaxin-750 Take 1 tablet (750 mg total) by mouth 2 (two) times daily as needed for muscle spasms.   MULTIVITAMIN PO Take 1 tablet by mouth daily.   ondansetron 4 MG  tablet Commonly known as: Zofran Take 1 tablet (4 mg total) by mouth every 8 (eight) hours as needed for nausea or vomiting.   oxyCODONE-acetaminophen 5-325 MG tablet Commonly known as: Percocet Take 1-2 tablets by mouth every 6 (six) hours as needed. To be taken after surgery   rosuvastatin 10 MG tablet Commonly known as: Crestor Take 1 tablet (10 mg total) by mouth daily.               Durable Medical Equipment  (From admission, onward)           Start     Ordered   07/05/22 1230  DME Walker rolling  Once       Question Answer Comment  Walker: With 5 Inch Wheels   Patient needs a walker to treat with the following condition Status post left partial knee replacement      07/05/22 1229   07/05/22 1230  DME 3 n 1  Once        07/05/22 1229   07/05/22 1230  DME Bedside commode  Once       Question:  Patient needs a bedside commode to treat with the following condition  Answer:  Status post left partial knee replacement   07/05/22 1229            Diagnostic Studies: DG Knee Right Port  Result Date: 07/05/2022 CLINICAL DATA:  Status post right total knee arthroplasty. EXAM: PORTABLE RIGHT KNEE - 1-2 VIEW COMPARISON:  06/10/2022 FINDINGS: There are postsurgical changes from a right total knee arthroplasty. The hardware components are in anatomic alignment. There is no periprosthetic fracture or dislocation. IMPRESSION: Status post right total knee arthroplasty. Electronically Signed   By: Kerby Moors M.D.   On: 07/05/2022 11:47   XR KNEE 3 VIEW RIGHT  Result Date: 06/10/2022 Advanced tricompartmental degenerative joint disease.  Bone-on-bone joint space narrowing.   Disposition: Discharge disposition: 03-Skilled Nursing Facility          Follow-up Information     Leandrew Koyanagi, MD. Go on 07/20/2022.   Specialty: Orthopedic Surgery Why: at 10:45 am for your first post op appointment with Dr. Sherilyn Cooter information: [place]  [id number] [phone]         [facility], [place] Follow up.   Specialty: [place] Why: someone from the office will call to schedule home health physical therapy visits Contact information: [place] [address] [phone]                  Signed: Aundra Dubin 07/09/2022, 7:54 AM

## 2022-07-08 NOTE — Progress Notes (Signed)
Physical Therapy Treatment Patient Details Name: Jean Howell MRN: [id number] DOB: [birth year] Today's Date: 07/08/2022   History of Present Illness 66 yo female s/p R TKA 07/05/20.  Pt with hx including but not limited to arthritis, HTN, and RA.    PT Comments    Pt required increased time for all tasks, repeated attempts for transfers, and rest breaks due to low back and R knee pain at this time. Pt was only able to tolerate ambulating ~70 ft before suddenly needing to sit due to pain. Discussed d/c options of home with Jean Howell services vs SNF but pt reporting feeling uncomfortable going home due to her risk for falls and living alone, which are valid concerns. Will continue to follow acutely.    Recommendations for follow up therapy are one component of a multi-disciplinary discharge planning process, led by the attending physician.  Recommendations may be updated based on patient status, additional functional criteria and insurance authorization.  Follow Up Recommendations  Follow physician's recommendations for discharge plan and follow up therapies (pt is planning for SNF)     Assistance Recommended at Discharge PRN  Patient can return home with the following A little help with bathing/dressing/bathroom;Assistance with cooking/housework;Assist for transportation;Help with stairs or ramp for entrance   Equipment Recommendations  None recommended by PT    Recommendations for Other Services       Precautions / Restrictions Precautions Precautions: Fall Restrictions Weight Bearing Restrictions: Yes RLE Weight Bearing: Weight bearing as tolerated     Mobility  Bed Mobility Overal bed mobility: Needs Assistance Bed Mobility: Supine to Sit, Sit to Supine     Supine to sit: HOB elevated, Supervision Sit to supine: HOB elevated, Supervision   General bed mobility comments: Supervision for safety and cuing for pt to hook L foot under R to assist R on and off the bed, needing extra  time due to pain.    Transfers Overall transfer level: Needs assistance Equipment used: Rolling walker (2 wheels) Transfers: Sit to/from Stand Sit to Stand: Supervision, Min guard           General transfer comment: Min guard-supervision to transfer to stand pulling up on RW, needing extra time and repeated attempts to be successful.    Ambulation/Gait Ambulation/Gait assistance: Min guard Gait Distance (Feet): 70 Feet (x2 bouts of ~70 ft each) Assistive device: Rolling walker (2 wheels) Gait Pattern/deviations: Decreased stance time - right, Antalgic, Step-through pattern, Trunk flexed Gait velocity: decreased Gait velocity interpretation: <1.31 ft/sec, indicative of household ambulator   General Gait Details: Pt with improved step-through and decreased flexed posture, but continues to display antalgic gait pattern and flexes her trunk at times. Pt self corrects to improve posture. Cues provided to improve foot clearance. Pt limited in distance by increased low back pain, needing to sit suddenly.   Stairs             Wheelchair Mobility    Modified Rankin (Stroke Patients Only)       Balance Overall balance assessment: Needs assistance Sitting-balance support: No upper extremity supported, Feet supported Sitting balance-Leahy Scale: Good     Standing balance support: Bilateral upper extremity supported, During functional activity Standing balance-Leahy Scale: Poor Standing balance comment: Reliant on RW                            Cognition Arousal/Alertness: Awake/alert Behavior During Therapy: WFL for tasks assessed/performed Overall Cognitive Status: Within Functional Limits for  tasks assessed                                 General Comments: Pt lethargic when supine, often drifting off during conversation. She reports she thinks it was due to meds and just eating.        Exercises Total Joint Exercises Ankle Circles/Pumps:  AROM, Both, 10 reps, Supine Quad Sets: AROM, Right, Supine, 10 reps Heel Slides: Right, 10 reps, Supine, AAROM    General Comments General comments (skin integrity, edema, etc.): BP 125/74 supine, 148/87 sitting, 133/81 standing; denies any dizziness now; discussed d/c options of home with Jean Howell services vs SNF but pt reporting feeling uncomfortable going home due to her risk for falls and living alone, valid concerns      Pertinent Vitals/Pain Pain Assessment Pain Assessment: Faces Faces Pain Scale: Hurts even more Pain Location: R knee, low back Pain Descriptors / Indicators: Discomfort, Grimacing, Operative site guarding Pain Intervention(s): Limited activity within patient's tolerance, Monitored during session, Repositioned    Home Living                          Prior Function            PT Goals (current goals can now be found in the care plan section) Acute Rehab PT Goals Patient Stated Goal: to go to a SNF PT Goal Formulation: With patient Time For Goal Achievement: 07/19/22 Potential to Achieve Goals: Good Progress towards PT goals: Progressing toward goals    Frequency    7X/week      PT Plan Current plan remains appropriate    Co-evaluation              AM-PAC PT "6 Clicks" Mobility   Outcome Measure  Help needed turning from your back to your side while in a flat bed without using bedrails?: None Help needed moving from lying on your back to sitting on the side of a flat bed without using bedrails?: None Help needed moving to and from a bed to a chair (including a wheelchair)?: A Little Help needed standing up from a chair using your arms (e.g., wheelchair or bedside chair)?: A Little Help needed to walk in hospital room?: A Little Help needed climbing 3-5 steps with a railing? : A Little 6 Click Score: 20    End of Session   Activity Tolerance: Patient tolerated treatment well;Patient limited by pain Patient left: with call bell/phone  within reach;in bed;with bed alarm set   PT Visit Diagnosis: Other abnormalities of gait and mobility (R26.89);Muscle weakness (generalized) (M62.81);Unsteadiness on feet (R26.81);Difficulty in walking, not elsewhere classified (R26.2);Pain Pain - Right/Left: Right Pain - part of body: Knee     Time: 8676-7209 PT Time Calculation (min) (ACUTE ONLY): 34 min  Charges:  $Gait Training: 8-22 mins $Therapeutic Activity: 8-22 mins                     Moishe Spice, PT, DPT Acute Rehabilitation Services  Office: [place] 07/08/2022, 4:57 PM

## 2022-07-08 NOTE — Progress Notes (Signed)
Subjective: 3 Days Post-Op Procedure(s) (LRB): RIGHT TOTAL KNEE ARTHROPLASTY (Right) Patient reports pain as mild.    Objective: Vital signs in last 24 hours: Temp:  [97.7 F (36.5 C)-98.1 F (36.7 C)] 98.1 F (36.7 C) (08/03 0742) Pulse Rate:  [71-91] 91 (08/03 0742) Resp:  [16-18] 18 (08/03 0742) BP: (106-153)/(62-99) 107/72 (08/03 0742) SpO2:  [100 %] 100 % (08/03 0742)  Intake/Output from previous day: 08/02 0701 - 08/03 0700 In: 240 [P.O.:240] Out: 400 [Urine:400] Intake/Output this shift: No intake/output data recorded.  Recent Labs    07/06/22 0602  HGB 10.1*   Recent Labs    07/06/22 0602  WBC 9.5  RBC 3.43*  HCT 29.8*  PLT 230   No results for input(s): "NA", "K", "CL", "CO2", "BUN", "CREATININE", "GLUCOSE", "CALCIUM" in the last 72 hours. No results for input(s): "LABPT", "INR" in the last 72 hours.  Neurologically intact Neurovascular intact Sensation intact distally Intact pulses distally Dorsiflexion/Plantar flexion intact Incision: scant drainage No cellulitis present Compartment soft   Assessment/Plan: 3 Days Post-Op Procedure(s) (LRB): RIGHT TOTAL KNEE ARTHROPLASTY (Right) Advance diet Up with therapy D/C IV fluids WBAT RLE ABLA- mild and stable D/c dispo pending insurance auth for SNF placement.  Patient is doing well with PT, but does not have any support at home Will go ahead and place a d/c order, but can change if needed.    Anticipated LOS equal to or greater than 2 midnights due to - Age 68 and older with one or more of the following:  - Obesity  - Expected need for hospital services (PT, OT, Nursing) required for safe  discharge  - Anticipated need for postoperative skilled nursing care or inpatient rehab  - Active co-morbidities: None OR   - Unanticipated findings during/Post Surgery: Slow post-op progression: GI, pain control, mobility  - Patient is a high risk of re-admission due to: Barriers to post-acute care  (logistical, no family support in home)   Jean Howell 07/08/2022, 7:49 AM

## 2022-07-08 NOTE — Progress Notes (Addendum)
Pt for dc to [facility] today pending auth. Spoke to [facility] and their Market researcher is currently reviewing request. SW will provide updates as available.    UPDATE 1505: [employer] offering peer-to-peer review which can be completed by calling [phone] opt 5. Review will need to be completed by 10am tomorrow morning. Nurse Case Manager at Dr. Phoebe Sharps office updated and agreed to notify him.   Wandra Feinstein, MSW, LCSW [phone] (coverage)

## 2022-07-09 LAB — GLUCOSE, CAPILLARY: Glucose-Capillary: 122 mg/dL — ABNORMAL HIGH (ref 70–99)

## 2022-07-09 MED ORDER — FLEET ENEMA 7-19 GM/118ML RE ENEM
1.0000 | ENEMA | Freq: Once | RECTAL | Status: AC
  Administered 2022-07-09: 1 via RECTAL
  Filled 2022-07-09: qty 1

## 2022-07-09 NOTE — TOC Transition Note (Signed)
Transition of Care [facility]) - CM/SW Discharge Note   Patient Details  Name: Brittaney Beaulieu MRN: [id number] Date of Birth: 03/28/1956  [facility]) CM/SW Contact:  Amador Cunas, [place] Phone Number: 07/09/2022, 11:01 AM   Clinical Narrative:  Peer-to-peer completed and denial successfully overturned. Approval details provided to Maryana at [facility] who confirmed they are prepared to admit pt to room W 133-1. Pt updated and agreeable to dc plan. RN provided with number for report and transport arranged per pt request. SW signing off at dc.   Wandra Feinstein, MSW, LCSW [phone] (coverage)       Final next level of care: [place] Barriers to Discharge: No Barriers Identified   Patient Goals and CMS Choice Patient states their goals for this hospitalization and ongoing recovery are:: going to a rehab center CMS Medicare.gov Compare Post Acute Care list provided to:: Patient Choice offered to / list presented to : Patient  Discharge Placement              Patient chooses bed at:  [facility]) Patient to be transferred to facility by: [facility]   Patient and family notified of of transfer: 07/09/22  Discharge Plan and Services                DME Arranged:  (CPM and Rw were delivered to patient's home prior to surgery) DME Agency: [employer]       HH Arranged: PT [place] Agency: [facility] (Anticipated discharge to SNF, but this is unknown at this time. Referral to HHPT made in case patient does go home instead.)        Social Determinants of Health (SDOH) Interventions     Readmission Risk Interventions     No data to display

## 2022-07-09 NOTE — Care Management Important Message (Signed)
Important Message  Patient Details  Name: Jean Howell MRN: [id number] Date of Birth: 1955-12-21   Medicare Important Message Given:  Yes     Hannah Beat 07/09/2022, 11:57 AM

## 2022-07-09 NOTE — Progress Notes (Signed)
15:46 [facility] was called. Report was given to [facility]. IV removed. Awaiting for transportation

## 2022-07-09 NOTE — Progress Notes (Signed)
Subjective: 4 Days Post-Op Procedure(s) (LRB): RIGHT TOTAL KNEE ARTHROPLASTY (Right) Patient reports pain as mild.    Objective: Vital signs in last 24 hours: Temp:  [98 F (36.7 C)-98.3 F (36.8 C)] 98.1 F (36.7 C) (08/04 0548) Pulse Rate:  [88-100] 88 (08/04 0548) Resp:  [18-19] 19 (08/04 0548) BP: (109-137)/(59-77) 109/69 (08/04 0548) SpO2:  [100 %] 100 % (08/04 0548)  Intake/Output from previous day: 08/03 0701 - 08/04 0700 In: 480 [P.O.:480] Out: -  Intake/Output this shift: No intake/output data recorded.  No results for input(s): "HGB" in the last 72 hours. No results for input(s): "WBC", "RBC", "HCT", "PLT" in the last 72 hours. No results for input(s): "NA", "K", "CL", "CO2", "BUN", "CREATININE", "GLUCOSE", "CALCIUM" in the last 72 hours. No results for input(s): "LABPT", "INR" in the last 72 hours.  Neurologically intact Neurovascular intact Sensation intact distally Intact pulses distally Dorsiflexion/Plantar flexion intact Incision: scant drainage No cellulitis present Compartment soft   Assessment/Plan: 4 Days Post-Op Procedure(s) (LRB): RIGHT TOTAL KNEE ARTHROPLASTY (Right) Advance diet Up with therapy D/C IV fluids Discharge to SNF today WBAT RLE ABLA- mild and stable  Anticipated LOS equal to or greater than 2 midnights due to - Age 68 and older with one or more of the following:  - Obesity  - Expected need for hospital services (PT, OT, Nursing) required for safe  discharge  - Anticipated need for postoperative skilled nursing care or inpatient rehab  - Active co-morbidities: None OR   - Unanticipated findings during/Post Surgery: Slow post-op progression: GI, pain control, mobility  - Patient is a high risk of re-admission due to: Barriers to post-acute care (logistical, no family support in home)   Aundra Dubin 07/09/2022, 7:53 AM

## 2022-07-09 NOTE — Progress Notes (Signed)
Pt was able to have a bowel movement after enema. Awaiting for transportation for d/c

## 2022-07-10 DIAGNOSIS — G629 Polyneuropathy, unspecified: Secondary | ICD-10-CM | POA: Diagnosis not present

## 2022-07-10 DIAGNOSIS — G4700 Insomnia, unspecified: Secondary | ICD-10-CM | POA: Diagnosis not present

## 2022-07-10 DIAGNOSIS — Z471 Aftercare following joint replacement surgery: Secondary | ICD-10-CM | POA: Diagnosis not present

## 2022-07-10 DIAGNOSIS — F32A Depression, unspecified: Secondary | ICD-10-CM | POA: Diagnosis not present

## 2022-07-10 DIAGNOSIS — M6281 Muscle weakness (generalized): Secondary | ICD-10-CM | POA: Diagnosis not present

## 2022-07-10 DIAGNOSIS — Z7401 Bed confinement status: Secondary | ICD-10-CM | POA: Diagnosis not present

## 2022-07-10 DIAGNOSIS — E785 Hyperlipidemia, unspecified: Secondary | ICD-10-CM | POA: Diagnosis not present

## 2022-07-10 DIAGNOSIS — R531 Weakness: Secondary | ICD-10-CM | POA: Diagnosis not present

## 2022-07-10 DIAGNOSIS — Z23 Encounter for immunization: Secondary | ICD-10-CM | POA: Diagnosis not present

## 2022-07-10 DIAGNOSIS — R2689 Other abnormalities of gait and mobility: Secondary | ICD-10-CM | POA: Diagnosis not present

## 2022-07-10 DIAGNOSIS — Z96651 Presence of right artificial knee joint: Secondary | ICD-10-CM | POA: Diagnosis not present

## 2022-07-10 DIAGNOSIS — M069 Rheumatoid arthritis, unspecified: Secondary | ICD-10-CM | POA: Diagnosis not present

## 2022-07-10 DIAGNOSIS — I10 Essential (primary) hypertension: Secondary | ICD-10-CM | POA: Diagnosis not present

## 2022-07-10 DIAGNOSIS — M0579 Rheumatoid arthritis with rheumatoid factor of multiple sites without organ or systems involvement: Secondary | ICD-10-CM | POA: Diagnosis not present

## 2022-07-12 ENCOUNTER — Telehealth: Payer: Self-pay | Admitting: *Deleted

## 2022-07-12 DIAGNOSIS — M069 Rheumatoid arthritis, unspecified: Secondary | ICD-10-CM | POA: Diagnosis not present

## 2022-07-12 DIAGNOSIS — Z96651 Presence of right artificial knee joint: Secondary | ICD-10-CM | POA: Diagnosis not present

## 2022-07-12 DIAGNOSIS — I10 Essential (primary) hypertension: Secondary | ICD-10-CM | POA: Diagnosis not present

## 2022-07-12 NOTE — Telephone Encounter (Signed)
Ortho bundle D/C call completed. 

## 2022-07-13 ENCOUNTER — Telehealth: Payer: Self-pay | Admitting: Orthopaedic Surgery

## 2022-07-13 NOTE — Telephone Encounter (Signed)
Brandi Investment banker, corporate) from [facility] rehab called requesting a call back. Brandi asking if pt need a CPM machine and if so what degree and how often. Please call Brandi at [phone]

## 2022-07-13 NOTE — Telephone Encounter (Signed)
Yes, 0-60 degrees

## 2022-07-15 ENCOUNTER — Other Ambulatory Visit (HOSPITAL_COMMUNITY): Payer: Self-pay

## 2022-07-15 ENCOUNTER — Telehealth: Payer: Self-pay | Admitting: Family Medicine

## 2022-07-15 NOTE — Telephone Encounter (Signed)
As far as I understand the patient's medications and blood pressures and things like that are managed by the rehab center doctors, so I would discuss this further with them, if they are not managing and we are managing then she likely needs to be seen by [place] in the office.

## 2022-07-15 NOTE — Telephone Encounter (Signed)
Called and spoke to patient she is there because she had knee replacement she [place] upset that her BP is low last check it was 118/60 told patient that was a stable BP but we had no say so over what the doctors are doing for her there she needs to express her concerns with them. Patient just wants to make Dr. Darnell Level aware. She is aware she is out of the office today.

## 2022-07-16 NOTE — Telephone Encounter (Signed)
Lm making pt aware

## 2022-07-16 NOTE — Telephone Encounter (Signed)
That BP is normal.  If BP goes below 408 systolic or 50 diastolic let me know.

## 2022-07-16 NOTE — Telephone Encounter (Signed)
Pt returned missed call. Reviewed providers note with pt. Pt voiced understanding.

## 2022-07-19 ENCOUNTER — Other Ambulatory Visit (HOSPITAL_COMMUNITY): Payer: Self-pay

## 2022-07-20 ENCOUNTER — Ambulatory Visit (INDEPENDENT_AMBULATORY_CARE_PROVIDER_SITE_OTHER): Payer: Medicare Other | Admitting: Physician Assistant

## 2022-07-20 ENCOUNTER — Encounter: Payer: Self-pay | Admitting: Orthopaedic Surgery

## 2022-07-20 DIAGNOSIS — Z96651 Presence of right artificial knee joint: Secondary | ICD-10-CM

## 2022-07-20 NOTE — Progress Notes (Signed)
Post-Op Visit Note   Patient: Jean Howell           Date of Birth: 03-06-1956           MRN: [id number] Visit Date: 07/20/2022 PCP: [name], DO   Assessment & Plan:  Chief Complaint:  Chief Complaint  Patient presents with   Right Knee - Follow-up    Right total knee arthroplasty 07/05/2022   Visit Diagnoses:  1. Hx of total knee replacement, right     Plan: Patient is a pleasant 66 year old female who comes in today 2 weeks status post right total knee replacement 07/05/2022.  She has been doing well.  She has been at a rehab facility where she is getting physical therapy and ambulating with a walker most of the time.  She has been compliant taking her baby aspirin twice daily for DVT prophylaxis.  Overall, doing well.  Examination right knee reveals a well-healed surgical incision with nylon sutures in place.  No evidence of infection or cellulitis.  Calves are soft and nontender.  She is neurovascular tact distally.  Today, sutures were removed and Steri-Strips applied.  She will likely be discharged home later this week and Judeen Hammans will be setting up her outpatient physical therapy.  She has been instructed to continue with a baby aspirin twice daily for DVT prophylaxis until she is 6 weeks postop.  At that time, she will follow-up with [place] for repeat evaluation and 2 view x-rays of the right knee.  Call with concerns or questions in the meantime.  Follow-Up Instructions: Return in about 4 weeks (around 08/17/2022).   Orders:  No orders of the defined types were placed in this encounter.  No orders of the defined types were placed in this encounter.   Imaging: No new imaging  PMFS History: Patient Active Problem List   Diagnosis Date Noted   Status post total right knee replacement 07/05/2022   Primary osteoarthritis of right knee 06/10/2022   Osteopenia of neck of right femur 03/16/2022   Full code status 06/16/2020   Impaired mobility and endurance 06/16/2020    Sciatica of left side 06/16/2020   Rheumatoid arthritis involving multiple sites with positive rheumatoid factor ([place]) 03/06/2019   Primary osteoarthritis of both knees 03/06/2019   Former smoker 03/06/2019   ANA positive 02/12/2019   Family history of thyroid disease 01/08/2019   Morbid obesity ([place]) 01/08/2019   Essential hypertension 01/08/2019   Family history of colon cancer 01/08/2019   Past Medical History:  Diagnosis Date   Arthritis    Depression    Hypertension    Rheumatoid arthritis ([place])     Family History  Problem Relation Age of Onset   Cancer Father    Colon cancer Father    Thyroid disease Mother    CVA Mother 43   Thyroid disease Sister    Healthy Son    Congestive Heart Failure Sister    Hypertension Sister    Diabetes Sister    Hypertension Sister    Diabetes Sister    Hypertension Sister    Hypertension Sister    Cancer Brother        kidney     Past Surgical History:  Procedure Laterality Date   BREAST EXCISIONAL BIOPSY Bilateral    pt unsure when- benign   BREAST SURGERY     nodules removed   COLONOSCOPY N/A 06/25/2019   Procedure: COLONOSCOPY;  Surgeon: Danie Binder, MD;  [facility];  Service: Endoscopy;  Laterality: N/A;  1:30   MOLE REMOVAL Right 01/26/2022   foot (patient was put to sleep for procedure)   POLYPECTOMY  06/25/2019   Procedure: POLYPECTOMY;  Surgeon: Danie Binder, MD;  [facility];  Service: Endoscopy;;   TOTAL KNEE ARTHROPLASTY Right 07/05/2022   Procedure: RIGHT TOTAL KNEE ARTHROPLASTY;  Surgeon: Leandrew Koyanagi, MD;  Location: [place];  Service: Orthopedics;  Laterality: Right;   Social History   Occupational History   Occupation: retired  Tobacco Use   Smoking status: Former    Packs/day: 1.00    Years: 15.00    Total pack years: 15.00    Types: Cigarettes    Quit date: 2000    Years since quitting: 23.6    Passive exposure: Never   Smokeless tobacco: Never  Vaping Use   Vaping  Use: Never used  Substance and Sexual Activity   Alcohol use: Not Currently   Drug use: Never   Sexual activity: Not Currently    Comment: widowed

## 2022-07-21 ENCOUNTER — Other Ambulatory Visit (HOSPITAL_COMMUNITY): Payer: Self-pay

## 2022-07-22 ENCOUNTER — Telehealth: Payer: Self-pay | Admitting: *Deleted

## 2022-07-22 ENCOUNTER — Other Ambulatory Visit: Payer: Self-pay | Admitting: *Deleted

## 2022-07-22 DIAGNOSIS — Z96651 Presence of right artificial knee joint: Secondary | ICD-10-CM

## 2022-07-22 NOTE — Telephone Encounter (Signed)
Ortho bundle call to patient. She confirmed she will be going home from SNF tomorrow. Appointment with OPPT at [facility] location already scheduled for Wednesday, 07/28/22 at 1:00 pm. Updated SW- Melanie at [facility] for her discharge information. Will continue to follow for needs.

## 2022-07-22 NOTE — Telephone Encounter (Signed)
Ortho bundle calls completed. 

## 2022-07-28 ENCOUNTER — Other Ambulatory Visit: Payer: Self-pay

## 2022-07-28 ENCOUNTER — Ambulatory Visit: Payer: Medicare Other | Attending: Orthopaedic Surgery | Admitting: Physical Therapy

## 2022-07-28 ENCOUNTER — Encounter: Payer: Self-pay | Admitting: Physical Therapy

## 2022-07-28 DIAGNOSIS — M25661 Stiffness of right knee, not elsewhere classified: Secondary | ICD-10-CM | POA: Insufficient documentation

## 2022-07-28 DIAGNOSIS — G8929 Other chronic pain: Secondary | ICD-10-CM | POA: Insufficient documentation

## 2022-07-28 DIAGNOSIS — M6281 Muscle weakness (generalized): Secondary | ICD-10-CM | POA: Insufficient documentation

## 2022-07-28 DIAGNOSIS — R600 Localized edema: Secondary | ICD-10-CM | POA: Insufficient documentation

## 2022-07-28 DIAGNOSIS — M25561 Pain in right knee: Secondary | ICD-10-CM | POA: Insufficient documentation

## 2022-07-28 DIAGNOSIS — Z96651 Presence of right artificial knee joint: Secondary | ICD-10-CM | POA: Diagnosis not present

## 2022-07-28 NOTE — Therapy (Signed)
OUTPATIENT PHYSICAL THERAPY LOWER EXTREMITY EVALUATION   Patient Name: Jean Howell MRN: [id number] DOB:1956-05-15, 66 y.o., female Today's Date: 07/28/2022   PT End of Session - 07/28/22 1323     Visit Number 1    Number of Visits 12    Date for PT Re-Evaluation 10/26/22             Past Medical History:  Diagnosis Date   Arthritis    Depression    Hypertension    Rheumatoid arthritis ([place])    Past Surgical History:  Procedure Laterality Date   BREAST EXCISIONAL BIOPSY Bilateral    pt unsure when- benign   BREAST SURGERY     nodules removed   COLONOSCOPY N/A 06/25/2019   Procedure: COLONOSCOPY;  Surgeon: Danie Binder, MD;  [facility];  Service: Endoscopy;  Laterality: N/A;  1:30   MOLE REMOVAL Right 01/26/2022   foot (patient was put to sleep for procedure)   POLYPECTOMY  06/25/2019   Procedure: POLYPECTOMY;  Surgeon: Danie Binder, MD;  [facility];  Service: Endoscopy;;   TOTAL KNEE ARTHROPLASTY Right 07/05/2022   Procedure: RIGHT TOTAL KNEE ARTHROPLASTY;  Surgeon: Leandrew Koyanagi, MD;  Location: [place];  Service: Orthopedics;  Laterality: Right;   Patient Active Problem List   Diagnosis Date Noted   Status post total right knee replacement 07/05/2022   Primary osteoarthritis of right knee 06/10/2022   Osteopenia of neck of right femur 03/16/2022   Full code status 06/16/2020   Impaired mobility and endurance 06/16/2020   Sciatica of left side 06/16/2020   Rheumatoid arthritis involving multiple sites with positive rheumatoid factor ([place]) 03/06/2019   Primary osteoarthritis of both knees 03/06/2019   Former smoker 03/06/2019   ANA positive 02/12/2019   Family history of thyroid disease 01/08/2019   Morbid obesity ([place]) 01/08/2019   Essential hypertension 01/08/2019   Family history of colon cancer 01/08/2019    REFERRING PROVIDER: Frankey Shown MD  REFERRING DIAG: S/p right total knee replacement.  THERAPY DIAG:  No diagnosis  found.  Rationale for Evaluation and Treatment Rehabilitation  ONSET DATE: 07/05/22 (surgery date).  SUBJECTIVE:   SUBJECTIVE STATEMENT: The patient presents to the clinic today s/p right total knee replacement performed on 07/05/22.  She is very pleased with her progress thus far.  She states she used a CPM and found it very helpful.  Her current pain-level is a 4/10.  She has some remaining steri-strips intact.  She states walking can increase her pain somewhat.  She is currently using a straight cane and prefers to use it on her surgical side.  PERTINENT HISTORY: OA, HTN, RA.  PAIN:  Are you having pain? Yes: NPRS scale: 4/10 Pain location: Right knee. Pain description: Ache, sharp. Aggravating factors: Walking. Relieving factors: Medication.  PRECAUTIONS: Other: Ultrasound.  WEIGHT BEARING RESTRICTIONS No  OBSERVATION:  Steri-strips remaining.  Incision appears to be healing well.  FALLS:  Has patient fallen in last 6 months? No  LIVING ENVIRONMENT: Lives with: lives with their spouse Lives in: House/apartment Stairs: Yes. Has following equipment at home: Single point cane  PLOF: Independent with basic ADLs  PATIENT GOALS Get around better with less right knee pain.   OBJECTIVE:   PATIENT SURVEYS:  FOTO Complete.  EDEMA:  Circumferential: Minimal edema per contralateral comparison.  PALPATION: Mild right anterior knee tenderness.  LOWER EXTREMITY ROM: Active right knee extension to -10 degrees and flexion to 85 degrees.  LOWER EXTREMITY MMT:  Patient easily performing an antigravity SLR without extensor lag. Right hip knee strength is 4 to 4+/5.   GAIT: Decreased stance time over right LE.  Patient using a straight cane and prefers to use on right side.  TODAY'S TREATMENT: Vasopneumatic on low x 20 minutes.  Patient enjoyed treatment and tolerated without complaint.   PATIENT EDUCATION:  Education details: Below. Person educated:  Patient Education method: Explanation, Demonstration, and Handouts Education comprehension: verbalized understanding   HOME EXERCISE PROGRAM: [place] by [place] Niguel Moure Aug 23rd, 2023 View at [email or web address] using code: [email or web address] Page 1 of 1 1 Exercise KNEE EXTENSION STRETCH - PROPPED While seated, prop your foot up on another chair and allow gravity to stretch your knee towards a more straightened position. Repeat 1 Time Hold 10 Minutes Complete 1 Set Perform 3 Times a Day  ASSESSMENT:  CLINICAL IMPRESSION: The patient presents to OPPT s/p right total knee replacement performed on 07/05/22.  She is doing very well thus far.  She lacks 10 degrees of active extension with flexion to 85 degrees.  She has minimal edema per contralateral comparison.  She is easily performing a SLR against gravity without extensor lag.  Her edema is minimal per contralateral comparison.  She is walking with a straight cane on the right with decreased stance time over her right LE.  Patient will benefit from skilled physical therapy intervention to address pain and deficits.  OBJECTIVE IMPAIRMENTS Abnormal gait, decreased activity tolerance, decreased ROM, decreased strength, increased edema, and pain.   ACTIVITY LIMITATIONS locomotion level  PARTICIPATION LIMITATIONS: cleaning and yard work  [employer] POTENTIAL: Excellent  CLINICAL DECISION MAKING: Stable/uncomplicated  EVALUATION COMPLEXITY: Low   GOALS: Goals reviewed with patient? Yes  SHORT TERM GOALS: Target date: 08/11/2022  Ind with an initial HEP. Baseline: Goal status: INITIAL  2.  Full active right knee extension. Baseline:  Goal status: INITIAL   LONG TERM GOALS: Target date: 09/08/2022   Ind with an advanced HEP. Baseline:  Goal status: INITIAL  2.  Active right knee flexion to 115 degrees+ so the patient can perform functional tasks and do so with pain not > 2-3/10. Baseline:  Goal status:  INITIAL  3.  Increase right hip and knee strength to a solid 5/5 to provide good stability for accomplishment of functional activities. Baseline:  Goal status: INITIAL  4.  Perform a reciprocating stair gait with one railing with pain not > 2-3/10. Baseline:  Goal status: INITIAL  5.  Perform ADL's with pain not > 2-3/10. Baseline:  Goal status: INITIAL  PLAN: PT FREQUENCY: 2x/week  PT DURATION: 6 weeks  PLANNED INTERVENTIONS: Therapeutic exercises, Therapeutic activity, Neuromuscular re-education, Gait training, Patient/Family education, Self Care, Electrical stimulation, Cryotherapy, Moist heat, Vasopneumatic device, and Manual therapy  PLAN FOR NEXT SESSION: Nustep with progression to recumbent bike, PROM, PRE's.  Vasopneumatic.   Breaunna Gottlieb, [place], PT 07/28/2022, 1:24 PM

## 2022-07-29 ENCOUNTER — Ambulatory Visit (INDEPENDENT_AMBULATORY_CARE_PROVIDER_SITE_OTHER): Payer: Medicare Other | Admitting: Family Medicine

## 2022-07-29 ENCOUNTER — Encounter: Payer: Self-pay | Admitting: Family Medicine

## 2022-07-29 VITALS — BP 127/83 | HR 98 | Temp 98.0°F | Ht 64.0 in | Wt 214.0 lb

## 2022-07-29 DIAGNOSIS — Z96651 Presence of right artificial knee joint: Secondary | ICD-10-CM | POA: Diagnosis not present

## 2022-07-29 DIAGNOSIS — I10 Essential (primary) hypertension: Secondary | ICD-10-CM | POA: Diagnosis not present

## 2022-07-29 DIAGNOSIS — Z09 Encounter for follow-up examination after completed treatment for conditions other than malignant neoplasm: Secondary | ICD-10-CM | POA: Diagnosis not present

## 2022-07-29 NOTE — Progress Notes (Signed)
Subjective:  Patient ID: Jean Howell, female    DOB: 1956/02/25, 66 y.o.   MRN: [id number]  Patient Care Team: [name], DO as PCP - General (Family Medicine) Gala Romney Cristopher Estimable, MD as Consulting Physician (Gastroenterology) Bo Merino, MD as Consulting Physician (Rheumatology) Allyn Kenner, MD (Dermatology) Harlen Labs, MD as Referring Physician (Optometry) Leandrew Koyanagi, MD as Attending Physician (Orthopedic Surgery)   Chief Complaint:  Hospitalization Follow-up   HPI: Jean Howell is a 66 y.o. female presenting on 07/29/2022 for Hospitalization Follow-up   Pt presents today for evaluation of low blood pressure readings since total knee replacement on 07/05/2022. She had a successful operation and has been recovering well. She went to a skilled nursing facility after procedure and is now home and undergoing PT. During her stay at the SNF her blood pressure was soft but she was receiving pain medications. States now that she has been home her blood pressure continues to run on the lower side. States this morning when she woke up her BP was 140/80. She did not take her blood pressure medications but did take a oxycodone for pain. BP 127/83 in office today. She reports when her blood pressure gets below 130 she does not feel well. States she last took her blood pressure medication yesterday. She denies chest pain, palpitations, shortness of breath, dizziness, weakness, or confusion. She does have slight swelling in her right knee and ankle from recent surgery, nothing new or worsening.     Relevant past medical, surgical, family, and social history reviewed and updated as indicated.  Allergies and medications reviewed and updated. Data reviewed: Chart in Epic.   Past Medical History:  Diagnosis Date   Arthritis    Depression    Hypertension    Rheumatoid arthritis ([place])     Past Surgical History:  Procedure Laterality Date   BREAST EXCISIONAL BIOPSY  Bilateral    pt unsure when- benign   BREAST SURGERY     nodules removed   COLONOSCOPY N/A 06/25/2019   Procedure: COLONOSCOPY;  Surgeon: Danie Binder, MD;  [facility];  Service: Endoscopy;  Laterality: N/A;  1:30   MOLE REMOVAL Right 01/26/2022   foot (patient was put to sleep for procedure)   POLYPECTOMY  06/25/2019   Procedure: POLYPECTOMY;  Surgeon: Danie Binder, MD;  [facility];  Service: Endoscopy;;   TOTAL KNEE ARTHROPLASTY Right 07/05/2022   Procedure: RIGHT TOTAL KNEE ARTHROPLASTY;  Surgeon: Leandrew Koyanagi, MD;  Location: [place];  Service: Orthopedics;  Laterality: Right;    Social History   Socioeconomic History   Marital status: Widowed    Spouse name: Not on file   Number of children: 1   Years of education: Not on file   Highest education level: GED or equivalent  Occupational History   Occupation: retired  Tobacco Use   Smoking status: Former    Packs/day: 1.00    Years: 15.00    Total pack years: 15.00    Types: Cigarettes    Quit date: 2000    Years since quitting: 23.6    Passive exposure: Never   Smokeless tobacco: Never  Vaping Use   Vaping Use: Never used  Substance and Sexual Activity   Alcohol use: Not Currently   Drug use: Never   Sexual activity: Not Currently    Comment: widowed  Other Topics Concern   Not on file  Social History Narrative   Patient  is a widow that resides independently in [place].  She has 1 son, who resides in [place].   Social Determinants of Health   Financial Resource Strain: Low Risk  (06/18/2022)   Overall Financial Resource Strain (CARDIA)    Difficulty of Paying Living Expenses: Not hard at all  Food Insecurity: No Food Insecurity (06/18/2022)   Hunger Vital Sign    Worried About Running Out of Food in the Last Year: Never true    Ran Out of Food in the Last Year: Never true  Transportation Needs: No Transportation Needs (06/18/2022)   PRAPARE - Radiographer, therapeutic (Medical): No    Lack of Transportation (Non-Medical): No  Physical Activity: Inactive (06/18/2022)   Exercise Vital Sign    Days of Exercise per Week: 0 days    Minutes of Exercise per Session: 0 min  Stress: No Stress Concern Present (06/18/2022)   [place]    Feeling of Stress : Not at all  Social Connections: Moderately Integrated (06/18/2022)   Social Connection and Isolation Panel [NHANES]    Frequency of Communication with Friends and Family: More than three times a week    Frequency of Social Gatherings with Friends and Family: Twice a week    Attends Religious Services: More than 4 times per year    Active Member of [employer] or Organizations: Yes    Attends Archivist Meetings: More than 4 times per year    Marital Status: Never married  Intimate Partner Violence: Not At Risk (06/18/2022)   Humiliation, Afraid, Rape, and Kick questionnaire    Fear of Current or Ex-Partner: No    Emotionally Abused: No    Physically Abused: No    Sexually Abused: No    Outpatient Encounter Medications as of 07/29/2022  Medication Sig   amitriptyline (ELAVIL) 25 MG tablet Take 1 tablet (25 mg total) by mouth at bedtime.   aspirin EC 81 MG tablet Take 1 tablet (81 mg total) by mouth 2 (two) times daily. To be taken after surgery to prevent blood clots.  Swallow whole.   Calcium-Magnesium-Zinc (CAL-MAG-ZINC PO) Take 1 tablet by mouth daily.   docusate sodium (COLACE) 100 MG capsule Take 1 capsule (100 mg total) by mouth daily as needed.   Etanercept (ENBREL MINI) 50 MG/ML SOCT Inject 50 mg into the skin once a week.   fluticasone (FLONASE) 50 MCG/ACT nasal spray Place 2 sprays into both nostrils daily. (Patient taking differently: Place 2 sprays into both nostrils daily as needed (Ears stopped up).)   gabapentin (NEURONTIN) 400 MG capsule TAKE ONE CAPSULE BY MOUTH AT BEDTIME   leflunomide (ARAVA) 20 MG tablet  TAKE ONE (1) TABLET EACH DAY   losartan-hydrochlorothiazide (HYZAAR) 100-12.5 MG tablet TAKE ONE (1) TABLET EACH DAY   methocarbamol (ROBAXIN-750) 750 MG tablet Take 1 tablet (750 mg total) by mouth 2 (two) times daily as needed for muscle spasms.   Multiple Vitamin (MULTIVITAMIN PO) Take 1 tablet by mouth daily.   ondansetron (ZOFRAN) 4 MG tablet Take 1 tablet (4 mg total) by mouth every 8 (eight) hours as needed for nausea or vomiting.   oxyCODONE-acetaminophen (PERCOCET) 5-325 MG tablet Take 1-2 tablets by mouth every 6 (six) hours as needed. To be taken after surgery   rosuvastatin (CRESTOR) 10 MG tablet Take 1 tablet (10 mg total) by mouth daily.   Suvorexant (BELSOMRA) 10 MG TABS Take 10 mg by mouth at  bedtime as needed (sleep).   No facility-administered encounter medications on file as of 07/29/2022.    No Known Allergies  Review of Systems  Constitutional:  Positive for activity change. Negative for appetite change, chills, diaphoresis, fatigue, fever and unexpected weight change.  Respiratory:  Negative for cough and shortness of breath.   Cardiovascular:  Positive for leg swelling. Negative for chest pain and palpitations.  Gastrointestinal:  Negative for abdominal pain.  Genitourinary:  Negative for decreased urine volume and difficulty urinating.  Musculoskeletal:  Positive for arthralgias, gait problem and joint swelling.  Neurological:  Positive for headaches. Negative for dizziness, tremors, seizures, syncope, facial asymmetry, speech difficulty, weakness, light-headedness and numbness.  Psychiatric/Behavioral:  Negative for confusion.   All other systems reviewed and are negative.       Objective:  BP 127/83   Pulse 98   Temp 98 F (36.7 C)   Ht '5\' 4"'  (1.626 m)   Wt 214 lb (97.1 kg)   SpO2 97%   BMI 36.73 kg/m    Wt Readings from Last 3 Encounters:  07/29/22 214 lb (97.1 kg)  07/05/22 218 lb (98.9 kg)  07/02/22 218 lb 12.8 oz (99.2 kg)    Physical  Exam Vitals and nursing note reviewed.  Constitutional:      General: She is not in acute distress.    Appearance: Normal appearance. She is well-developed and well-groomed. She is morbidly obese. She is not ill-appearing, toxic-appearing or diaphoretic.  HENT:     Head: Normocephalic and atraumatic.     Mouth/Throat:     Mouth: Mucous membranes are moist.  Eyes:     Conjunctiva/sclera: Conjunctivae normal.     Pupils: Pupils are equal, round, and reactive to light.  Cardiovascular:     Rate and Rhythm: Normal rate and regular rhythm.     Pulses: Normal pulses.     Heart sounds: Normal heart sounds.  Pulmonary:     Effort: Pulmonary effort is normal.     Breath sounds: Normal breath sounds.  Musculoskeletal:     Right knee: Swelling (minimal) present.     Right lower leg: Edema (trace, ankle) present.     Left lower leg: Edema (trace, ankle) present.       Legs:  Skin:    General: Skin is warm and dry.     Capillary Refill: Capillary refill takes less than 2 seconds.  Neurological:     General: No focal deficit present.     Mental Status: She is alert and oriented to person, place, and time.     Gait: Gait abnormal (using cane).  Psychiatric:        Mood and Affect: Mood normal.        Behavior: Behavior normal.        Thought Content: Thought content normal.        Judgment: Judgment normal.     Results for orders placed or performed during the hospital encounter of 07/05/22  CBC  Result Value Ref Range   WBC 9.5 4.0 - 10.5 K/uL   RBC 3.43 (L) 3.87 - 5.11 MIL/uL   Hemoglobin 10.1 (L) 12.0 - 15.0 g/dL   HCT 29.8 (L) 36.0 - 46.0 %   MCV 86.9 80.0 - 100.0 fL   MCH 29.4 26.0 - 34.0 pg   MCHC 33.9 30.0 - 36.0 g/dL   RDW 14.6 11.5 - 15.5 %   Platelets 230 150 - 400 K/uL   nRBC 0.0 0.0 - 0.2 %  Glucose, capillary  Result Value Ref Range   Glucose-Capillary 122 (H) 70 - 99 mg/dL       Pertinent labs & imaging results that were available during my care of the  patient were reviewed by me and considered in my medical decision making.  Assessment & Plan:  Manasvi was seen today for hospitalization follow-up.  Diagnoses and all orders for this visit:  Hospital discharge follow-up Status post total right knee replacement Essential hypertension BP has been running on the softer side since discharge from hospital. This is likely due to pain medications. Will check for potential anemia or electrolyte disturbances. Discussed monitoring BP daily and need for BP medications if systolic is greater than 658. Aware pain medications may be cause of lower readings. No changes made today, pt aware of normal BP parameters and when to take medications. Follow up in 4 weeks for reevaluation, sooner if warranted.  -     CBC with Differential/Platelet -     BMP8+EGFR   Continue all other maintenance medications.  Follow up plan: Return in about 4 weeks (around 08/26/2022), or if symptoms worsen or fail to improve, for HTN.   Continue healthy lifestyle choices, including diet (rich in fruits, vegetables, and lean proteins, and low in salt and simple carbohydrates) and exercise (at least 30 minutes of moderate physical activity daily).  Educational handout given for DASH diet, BP log  The above assessment and management plan was discussed with the patient. The patient verbalized understanding of and has agreed to the management plan. Patient is aware to call the clinic if they develop any new symptoms or if symptoms persist or worsen. Patient is aware when to return to the clinic for a follow-up visit. Patient educated on when it is appropriate to go to the emergency department.   Monia Pouch, FNP-C [facility] [phone]

## 2022-07-29 NOTE — Patient Instructions (Signed)
Monitor BP and hold BP medications if top number is below 130. Keep a log of medications along with blood pressure readings    Goal BP:  For patients younger than 60: Goal BP < 140/90. For patients 60 and older: Goal BP < 150/90. For patients with diabetes: Goal BP < 140/90.  Take your medications faithfully as prescribed. Maintain a healthy weight. Get at least 150 minutes of aerobic exercise per week. Minimize salt intake, less than 2000 mg per day. Minimize alcohol intake.  DASH Eating Plan DASH stands for "Dietary Approaches to Stop Hypertension." The DASH eating plan is a healthy eating plan that has been shown to reduce high blood pressure (hypertension). Additional health benefits may include reducing the risk of type 2 diabetes mellitus, heart disease, and stroke. The DASH eating plan may also help with weight loss.  WHAT DO I NEED TO KNOW ABOUT THE DASH EATING PLAN? For the DASH eating plan, you will follow these general guidelines: Choose foods with a percent daily value for sodium of less than 5% (as listed on the food label). Use salt-free seasonings or herbs instead of table salt or sea salt. Check with your health care provider or pharmacist before using salt substitutes. Eat lower-sodium products, often labeled as "lower sodium" or "no salt added." Eat fresh foods. Eat more vegetables, fruits, and low-fat dairy products. Choose whole grains. Look for the word "whole" as the first word in the ingredient list. Choose fish and skinless chicken or [place] more often than red meat. Limit fish, poultry, and meat to 6 oz (170 g) each day. Limit sweets, desserts, sugars, and sugary drinks. Choose heart-healthy fats. Limit cheese to 1 oz (28 g) per day. Eat more home-cooked food and less restaurant, buffet, and fast food. Limit fried foods. Cook foods using methods other than frying. Limit canned vegetables. If you do use them, rinse them well to decrease the sodium. When  eating at a restaurant, ask that your food be prepared with less salt, or no salt if possible.  WHAT FOODS CAN I EAT? Seek help from a dietitian for individual calorie needs.  Grains Whole grain or whole wheat bread. Brown rice. Whole grain or whole wheat pasta. Quinoa, bulgur, and whole grain cereals. Low-sodium cereals. Corn or whole wheat flour tortillas. Whole grain cornbread. Whole grain crackers. Low-sodium crackers.  Vegetables Fresh or frozen vegetables (raw, steamed, roasted, or grilled). Low-sodium or reduced-sodium tomato and vegetable juices. Low-sodium or reduced-sodium tomato sauce and paste. Low-sodium or reduced-sodium canned vegetables.   Fruits All fresh, canned (in natural juice), or frozen fruits.  Meat and Other Protein Products Ground beef (85% or leaner), grass-fed beef, or beef trimmed of fat. Skinless chicken or [place]. Ground chicken or [place]. Pork trimmed of fat. All fish and seafood. Eggs. Dried beans, peas, or lentils. Unsalted nuts and seeds. Unsalted canned beans.  Dairy Low-fat dairy products, such as skim or 1% milk, 2% or reduced-fat cheeses, low-fat ricotta or cottage cheese, or plain low-fat yogurt. Low-sodium or reduced-sodium cheeses.  Fats and Oils Tub margarines without trans fats. Light or reduced-fat mayonnaise and salad dressings (reduced sodium). Avocado. Safflower, olive, or canola oils. Natural peanut or almond butter.  Other Unsalted popcorn and pretzels. The items listed above may not be a complete list of recommended foods or beverages. Contact your dietitian for more options.  WHAT FOODS ARE NOT RECOMMENDED?  Grains White bread. White pasta. White rice. Refined cornbread. Bagels and croissants. Crackers that contain trans fat.  Vegetables Creamed or fried vegetables. Vegetables in a cheese sauce. Regular canned vegetables. Regular canned tomato sauce and paste. Regular tomato and vegetable juices.  Fruits Dried fruits. Canned  fruit in light or heavy syrup. Fruit juice.  Meat and Other Protein Products Fatty cuts of meat. Ribs, chicken wings, bacon, sausage, bologna, salami, chitterlings, fatback, hot dogs, bratwurst, and packaged luncheon meats. Salted nuts and seeds. Canned beans with salt.  Dairy Whole or 2% milk, cream, half-and-half, and cream cheese. Whole-fat or sweetened yogurt. Full-fat cheeses or blue cheese. Nondairy creamers and whipped toppings. Processed cheese, cheese spreads, or cheese curds.  Condiments Onion and garlic salt, seasoned salt, table salt, and sea salt. Canned and packaged gravies. Worcestershire sauce. Tartar sauce. Barbecue sauce. Teriyaki sauce. Soy sauce, including reduced sodium. Steak sauce. Fish sauce. Oyster sauce. Cocktail sauce. Horseradish. Ketchup and mustard. Meat flavorings and tenderizers. Bouillon cubes. Hot sauce. Tabasco sauce. Marinades. Taco seasonings. Relishes.  Fats and Oils Butter, stick margarine, lard, shortening, ghee, and bacon fat. Coconut, palm kernel, or palm oils. Regular salad dressings.  Other Pickles and olives. Salted popcorn and pretzels.  The items listed above may not be a complete list of foods and beverages to avoid. Contact your dietitian for more information.  WHERE CAN I FIND MORE INFORMATION? National Heart, Lung, and Blood Institute: travelstabloid.com Document Released: 11/11/2011 Document Revised: 04/08/2014 Document Reviewed: 09/26/2013 [facility] Patient Information 2015 [place], [place]. This information is not intended to replace advice given to you by your health care provider. Make sure you discuss any questions you have with your health care provider.   I think that you would greatly benefit from seeing a nutritionist.  If you are interested, please call Dr. Jenne Campus at [phone] to schedule an appointment.

## 2022-07-30 LAB — BMP8+EGFR
BUN/Creatinine Ratio: 12 (ref 12–28)
BUN: 13 mg/dL (ref 8–27)
CO2: 22 mmol/L (ref 20–29)
Calcium: 9.8 mg/dL (ref 8.7–10.3)
Chloride: 102 mmol/L (ref 96–106)
Creatinine, Ser: 1.06 mg/dL — ABNORMAL HIGH (ref 0.57–1.00)
Glucose: 103 mg/dL — ABNORMAL HIGH (ref 70–99)
Potassium: 4 mmol/L (ref 3.5–5.2)
Sodium: 140 mmol/L (ref 134–144)
eGFR: 57 mL/min/{1.73_m2} — ABNORMAL LOW (ref >59)

## 2022-07-30 LAB — CBC WITH DIFFERENTIAL/PLATELET
Basophils Absolute: 0.1 10*3/uL (ref 0.0–0.2)
Basos: 1 %
EOS (ABSOLUTE): 0.6 10*3/uL — ABNORMAL HIGH (ref 0.0–0.4)
Eos: 9 %
Hematocrit: 35.8 % (ref 34.0–46.6)
Hemoglobin: 11.6 g/dL (ref 11.1–15.9)
Immature Grans (Abs): 0 10*3/uL (ref 0.0–0.1)
Immature Granulocytes: 0 %
Lymphocytes Absolute: 2.1 10*3/uL (ref 0.7–3.1)
Lymphs: 30 %
MCH: 28.6 pg (ref 26.6–33.0)
MCHC: 32.4 g/dL (ref 31.5–35.7)
MCV: 88 fL (ref 79–97)
Monocytes Absolute: 0.4 10*3/uL (ref 0.1–0.9)
Monocytes: 6 %
Neutrophils Absolute: 3.7 10*3/uL (ref 1.4–7.0)
Neutrophils: 54 %
Platelets: 426 10*3/uL (ref 150–450)
RBC: 4.06 x10E6/uL (ref 3.77–5.28)
RDW: 14.6 % (ref 11.7–15.4)
WBC: 6.9 10*3/uL (ref 3.4–10.8)

## 2022-07-30 NOTE — Progress Notes (Signed)
Patient calling back. Please return call.

## 2022-08-02 ENCOUNTER — Telehealth: Payer: Self-pay | Admitting: *Deleted

## 2022-08-02 NOTE — Patient Outreach (Signed)
  Care Coordination   08/02/2022 Name: Jean Howell MRN: [id number] DOB: 17-Jun-1956   Care Coordination Outreach Attempts:  An unsuccessful telephone outreach was attempted today to offer the patient information about available care coordination services as a benefit of their health plan.   Follow Up Plan:  Additional outreach attempts will be made to offer the patient care coordination information and services.   Encounter Outcome:  No Answer  Care Coordination Interventions Activated:  No   Care Coordination Interventions:  No, not indicated    Emelia Loron RN, BSN [place] [phone] [email or web address]

## 2022-08-03 ENCOUNTER — Ambulatory Visit: Payer: Medicare Other | Admitting: *Deleted

## 2022-08-03 ENCOUNTER — Other Ambulatory Visit: Payer: Self-pay | Admitting: Rheumatology

## 2022-08-03 ENCOUNTER — Encounter: Payer: Self-pay | Admitting: *Deleted

## 2022-08-03 DIAGNOSIS — G8929 Other chronic pain: Secondary | ICD-10-CM

## 2022-08-03 DIAGNOSIS — M6281 Muscle weakness (generalized): Secondary | ICD-10-CM

## 2022-08-03 DIAGNOSIS — R600 Localized edema: Secondary | ICD-10-CM | POA: Diagnosis not present

## 2022-08-03 DIAGNOSIS — M25561 Pain in right knee: Secondary | ICD-10-CM | POA: Diagnosis not present

## 2022-08-03 DIAGNOSIS — Z96651 Presence of right artificial knee joint: Secondary | ICD-10-CM | POA: Diagnosis not present

## 2022-08-03 DIAGNOSIS — M25661 Stiffness of right knee, not elsewhere classified: Secondary | ICD-10-CM

## 2022-08-03 NOTE — Telephone Encounter (Signed)
Next Visit: 08/17/2022  Last Visit: 03/17/2022  Last Fill: 04/05/2022  DX: Rheumatoid arthritis involving multiple sites with positive rheumatoid factor   Current Dose per office note 03/17/2022: Arava 20 mg 1 tablet by mouth daily  Labs: 07/29/2022 EOS 0.6, Glucose 103, Creatinine 1.06, eGFR 57,   Okay to refill Arava?

## 2022-08-03 NOTE — Progress Notes (Unsigned)
Office Visit Note  Patient: Jean Howell             Date of Birth: 20-Dec-1955           MRN: [id number]             PCP: [name], DO Referring: [name], DO Visit Date: 08/17/2022 Occupation: '@GUAROCC'$ @  Subjective:  Discussed medication options  History of Present Illness: Jean Howell is a 66 y.o. female with history of seropositive rheumatoid arthritis and osteoarthritis.  Patient reports that she had her right knee replaced by Dr. Erlinda Hong on 07/05/2022.  She has been going to physical therapy twice a week as advised and has continued to notice improvement in her range of motion and strength.  Overall her right knee replacement has been doing well.  She continues to use a cane to assist with ambulation.  She states that she has been off of Enbrel since mid July prior to her surgery and has not restarted on Enbrel yet.  She has been taking Arava on a daily basis as prescribed.  She states that she notices significant hair thinning while on Arava and would like to discuss other treatment options today.  She does not want to restart on Enbrel at this time due to pain with injections. She continues to experience chronic pain in both ankle joints.  She also has intermittent stiffness and discomfort in both hands.  She uses arthritis compression gloves for symptomatic relief. She denies any recent or recurrent infections.      Activities of Daily Living:  Patient reports morning stiffness for all day.  Patient Denies nocturnal pain.  Difficulty dressing/grooming: Denies Difficulty climbing stairs: Reports Difficulty getting out of chair: Reports Difficulty using hands for taps, buttons, cutlery, and/or writing: Reports  Review of Systems  Constitutional:  Positive for fatigue.  HENT:  Negative for mouth sores and mouth dryness.   Eyes:  Negative for dryness.  Respiratory:  Negative for shortness of breath.   Cardiovascular:  Negative for chest pain and  palpitations.  Gastrointestinal:  Negative for blood in stool, constipation and diarrhea.  Endocrine: Negative for increased urination.  Genitourinary:  Negative for involuntary urination.  Musculoskeletal:  Positive for joint pain, gait problem, joint pain, joint swelling, myalgias, muscle weakness, morning stiffness, muscle tenderness and myalgias.  Skin:  Positive for hair loss. Negative for color change, rash and sensitivity to sunlight.  Allergic/Immunologic: Negative for susceptible to infections.  Neurological:  Negative for dizziness and headaches.  Hematological:  Negative for swollen glands.  Psychiatric/Behavioral:  Negative for depressed mood and sleep disturbance. The patient is not nervous/anxious.     PMFS History:  Patient Active Problem List   Diagnosis Date Noted   Status post total right knee replacement 07/05/2022   Primary osteoarthritis of right knee 06/10/2022   Osteopenia of neck of right femur 03/16/2022   Full code status 06/16/2020   Impaired mobility and endurance 06/16/2020   Sciatica of left side 06/16/2020   Rheumatoid arthritis involving multiple sites with positive rheumatoid factor ([place]) 03/06/2019   Primary osteoarthritis of both knees 03/06/2019   Former smoker 03/06/2019   ANA positive 02/12/2019   Family history of thyroid disease 01/08/2019   Morbid obesity ([place]) 01/08/2019   Essential hypertension 01/08/2019   Family history of colon cancer 01/08/2019    Past Medical History:  Diagnosis Date   Arthritis    Depression    Hypertension    Rheumatoid arthritis ([place])  Family History  Problem Relation Age of Onset   Cancer Father    Colon cancer Father    Thyroid disease Mother    CVA Mother 51   Thyroid disease Sister    Healthy Son    Congestive Heart Failure Sister    Hypertension Sister    Diabetes Sister    Hypertension Sister    Diabetes Sister    Hypertension Sister    Hypertension Sister    Cancer Brother        kidney     Past Surgical History:  Procedure Laterality Date   BREAST EXCISIONAL BIOPSY Bilateral    pt unsure when- benign   BREAST SURGERY     nodules removed   COLONOSCOPY N/A 06/25/2019   Procedure: COLONOSCOPY;  Surgeon: Danie Binder, MD;  [facility];  Service: Endoscopy;  Laterality: N/A;  1:30   MOLE REMOVAL Right 01/26/2022   foot (patient was put to sleep for procedure)   POLYPECTOMY  06/25/2019   Procedure: POLYPECTOMY;  Surgeon: Danie Binder, MD;  [facility];  Service: Endoscopy;;   TOTAL KNEE ARTHROPLASTY Right 07/05/2022   Procedure: RIGHT TOTAL KNEE ARTHROPLASTY;  Surgeon: Leandrew Koyanagi, MD;  Location: [facility];  Service: Orthopedics;  Laterality: Right;   Social History   Social History Narrative   Patient is a widow that resides independently in [place].  She has 1 son, who resides in [place].   Immunization History  Administered Date(s) Administered   Fluad Quad(high Dose 65+) 09/16/2020   Influenza, Quadrivalent, Recombinant, Inj, Pf 09/19/2019   PFIZER(Purple Top)SARS-COV-2 Vaccination 05/23/2020, 06/13/2020, 07/31/2020, 02/28/2021   PNEUMOCOCCAL CONJUGATE-20 07/07/2022     Objective: Vital Signs: BP (!) 135/91 (BP Location: Left Arm, Patient Position: Sitting, Cuff Size: Normal)   Pulse 89   Resp 14   Ht '5\' 4"'$  (1.626 m)   Wt 211 lb 6.4 oz (95.9 kg)   BMI 36.29 kg/m    Physical Exam Vitals and nursing note reviewed.  Constitutional:      Appearance: She is well-developed.  HENT:     Head: Normocephalic and atraumatic.  Eyes:     Conjunctiva/sclera: Conjunctivae normal.  Cardiovascular:     Rate and Rhythm: Normal rate and regular rhythm.     Heart sounds: Normal heart sounds.  Pulmonary:     Effort: Pulmonary effort is normal.     Breath sounds: Normal breath sounds.  Abdominal:     General: Bowel sounds are normal.     Palpations: Abdomen is soft.  Musculoskeletal:     Cervical back: Normal range of motion.   Skin:    General: Skin is warm and dry.     Capillary Refill: Capillary refill takes less than 2 seconds.  Neurological:     Mental Status: She is alert and oriented to person, place, and time.  Psychiatric:        Behavior: Behavior normal.      Musculoskeletal Exam: C-spine has good range of motion.  Shoulder joints, elbow joints, wrist joints, MCPs, PIPs, DIPs have good range of motion with no synovitis.  Some tenderness over both wrist joints noted.  Hip joints have slightly limited range of motion but no groin pain currently.  Right knee replacement has good range of motion with warmth but no effusion.  Left knee joint has good range of motion with no warmth or effusion.  Ankle joints have good range of motion with no tenderness or joint  swelling.  CDAI Exam: CDAI Score: 1  Patient Global: 5 mm; Provider Global: 5 mm Swollen: 0 ; Tender: 0  Joint Exam 08/17/2022   No joint exam has been documented for this visit   There is currently no information documented on the homunculus. Go to the Rheumatology activity and complete the homunculus joint exam.  Investigation: No additional findings.  Imaging: XR Knee 1-2 Views Right  Result Date: 08/17/2022 Well-seated prosthesis without complication   Recent Labs: Lab Results  Component Value Date   WBC 6.9 07/29/2022   HGB 11.6 07/29/2022   PLT 426 07/29/2022   NA 140 07/29/2022   K 4.0 07/29/2022   CL 102 07/29/2022   CO2 22 07/29/2022   GLUCOSE 103 (H) 07/29/2022   BUN 13 07/29/2022   CREATININE 1.06 (H) 07/29/2022   BILITOT 0.4 07/02/2022   ALKPHOS 58 07/02/2022   AST 25 07/02/2022   ALT 17 07/02/2022   PROT 8.1 07/02/2022   ALBUMIN 3.2 (L) 07/02/2022   CALCIUM 9.8 07/29/2022   GFRAA 88 01/07/2021   QFTBGOLDPLUS NEGATIVE 03/01/2022    Speciality Comments: PLQ Eye Exam: 12/16/2018 WNL @ Dr Anthony Sar Follow up in 1 year  Procedures:  No procedures performed Allergies: Patient has no known allergies.   Assessment /  Plan:     Visit Diagnoses: Rheumatoid arthritis involving multiple sites with positive rheumatoid factor (HCC) - Positive RF, +14-3-3 eta, Positive ANA, erosive inflammatory: She has no synovitis on examination today.  He is currently on Areva 20 mg 1 tablet by mouth daily.  She has been off of Enbrel since mid July prior to proceeding with a right knee replacement on 07/05/2022.  She does not want to restart on Enbrel at this time due to pain with injections.  I discussed the importance of remaining on Enbrel as prescribed to prevent further progression, recurrent flares, and further systemic involvement.  She is apprehensive to restart on a biologic agent at this time due to concern for immunosuppression as well as the risk for cancer.  She also requested to discontinue arava due to hair thinning.  I discussed the risks of recurrent and severe flares if she discontinues both medications.  She is willing to remain on [place] as prescribed.  She was advised to notify us if she develops increased joint pain or joint swelling on monotherapy.  She will follow-up in the office in 3 months or sooner if needed.  X-rays of both hands and feet were updated at her last office visit on 03/17/2022 and no radiographic progression was noted compared to x-rays from 2020.  High risk medication use - Arava 20 mg 1 tablet by mouth daily.   Initiated Enbrel on 11/03/2021-discontinued in July 2023. Encouraged the patient to restart on Enbrel but she has declined at this time due to pain with injections.  Does not want to be on any Biologics at this time due to the concern for side effects especially the increased risk for skin cancer. CBC and BMP drawn on 07/29/2022.  CMP drawn on 07/02/22: LFTs WNL.  TB Gold negative on 03/01/2022.   Discussed the importance of holding [place] if she develops signs or symptoms of an infection and to resume once the infection has completely cleared.  Followed by dermatology.   Primary  osteoarthritis of left knee: She has good range of motion of the left knee joint on examination today.  No warmth or effusion noted.  S/P TKR (total knee replacement), right: Performed by  Dr. Erlinda Hong on 07/05/22. Doing well.  She has been going to physical therapy twice a week.  Using a cane to assist with ambulation.  ANA positive: No clinical features of systemic lupus at this time.  Other fatigue: Stable.   Other medical conditions are listed as follows:   Essential hypertension: BP 135/91 today in the office.   History of depression  Former smoker  Family history of thyroid disease  Family history of colon cancer  Orders: No orders of the defined types were placed in this encounter.  No orders of the defined types were placed in this encounter.    Follow-Up Instructions: Return in about 3 months (around 11/16/2022) for Rheumatoid arthritis, Osteoarthritis.   Ofilia Neas, PA-C  Note - This record has been created using Dragon software.  Chart creation errors have been sought, but may not always  have been located. Such creation errors do not reflect on  the standard of medical care.

## 2022-08-03 NOTE — Therapy (Signed)
OUTPATIENT PHYSICAL THERAPY LOWER EXTREMITY TREATMENT   Patient Name: Jean Howell MRN: [id number] DOB:[birth year], 66 y.o., female Today's Date: 08/03/2022   PT End of Session - 08/03/22 1326     Visit Number 2    Number of Visits 12    Date for PT Re-Evaluation 10/26/22    Authorization Type FOTO AT LEAST EVERY 5TH VISIT.  PROGRESS NOTE AT 10TH VISIT.  KX MODIFIER AFTER 15 VISITS.    PT Start Time 1330    PT Stop Time 1424    PT Time Calculation (min) 54 min             Past Medical History:  Diagnosis Date   Arthritis    Depression    Hypertension    Rheumatoid arthritis ([place])    Past Surgical History:  Procedure Laterality Date   BREAST EXCISIONAL BIOPSY Bilateral    pt unsure when- benign   BREAST SURGERY     nodules removed   COLONOSCOPY N/A 06/25/2019   Procedure: COLONOSCOPY;  Surgeon: Danie Binder, MD;  [facility];  Service: Endoscopy;  Laterality: N/A;  1:30   MOLE REMOVAL Right 01/26/2022   foot (patient was put to sleep for procedure)   POLYPECTOMY  06/25/2019   Procedure: POLYPECTOMY;  Surgeon: Danie Binder, MD;  [facility];  Service: Endoscopy;;   TOTAL KNEE ARTHROPLASTY Right 07/05/2022   Procedure: RIGHT TOTAL KNEE ARTHROPLASTY;  Surgeon: Leandrew Koyanagi, MD;  Location: [facility];  Service: Orthopedics;  Laterality: Right;   Patient Active Problem List   Diagnosis Date Noted   Status post total right knee replacement 07/05/2022   Primary osteoarthritis of right knee 06/10/2022   Osteopenia of neck of right femur 03/16/2022   Full code status 06/16/2020   Impaired mobility and endurance 06/16/2020   Sciatica of left side 06/16/2020   Rheumatoid arthritis involving multiple sites with positive rheumatoid factor ([place]) 03/06/2019   Primary osteoarthritis of both knees 03/06/2019   Former smoker 03/06/2019   ANA positive 02/12/2019   Family history of thyroid disease 01/08/2019   Morbid obesity ([place]) 01/08/2019   Essential  hypertension 01/08/2019   Family history of colon cancer 01/08/2019    REFERRING PROVIDER: Frankey Shown MD  REFERRING DIAG: S/p right total knee replacement.  THERAPY DIAG:  Chronic pain of right knee  Stiffness of right knee, not elsewhere classified  Localized edema  Muscle weakness (generalized)  Rationale for Evaluation and Treatment Rehabilitation  ONSET DATE: 07/05/22 (surgery date).  SUBJECTIVE:   SUBJECTIVE STATEMENT: The patient presents to the clinic today s/p right total knee replacement performed on 07/05/22.She reports RT knee pain 2/10 PERTINENT HISTORY: OA, HTN, RA.  PAIN:  Are you having pain? Yes: NPRS scale: 2/10 Pain location: Right knee. Pain description: Ache, sharp. Aggravating factors: Walking. Relieving factors: Medication.  PRECAUTIONS: Other: Ultrasound.  WEIGHT BEARING RESTRICTIONS No  OBSERVATION:  Steri-strips remaining.  Incision appears to be healing well.  FALLS:  Has patient fallen in last 6 months? No  LIVING ENVIRONMENT: Lives with: lives with their spouse Lives in: House/apartment Stairs: Yes. Has following equipment at home: Single point cane  PLOF: Independent with basic ADLs  PATIENT GOALS Get around better with less right knee pain.   OBJECTIVE:     TODAY'S TREATMENT:  EXERCISE LOG        08-03-22   Exercise Repetitions and Resistance Comments  Nustep  L1 X 15  mins Seat 10,9,8 for flexion progression   Rocker board X5 mins DF/PF calf stretching   8in step lunge 2x10 flexion stretch and quad control   LAQ 2x10 hold 5 secs   QS with heel prop 2x10 with 5 sec holds    Blank cell = exercise not performed today  Manual:  STW to quads and HS's as well as extension stretch with quad sets and heel prop  Vasopneumatic on low x 10 minutes. 34 degrees for edema   Patient enjoyed treatment and tolerated without complaint. Discussed current HEP and instructed in toe/heel lifts  as well as marching  PATIENT EDUCATION:  Education details: Below. Person educated: Patient Education method: Explanation, Demonstration, and Handouts Education comprehension: verbalized understanding   HOME EXERCISE PROGRAM: [place] by Mali Applegate Aug 23rd, 2023 View at [email or web address] using code: [email or web address] Page 1 of 1 1 Exercise KNEE EXTENSION STRETCH - PROPPED While seated, prop your foot up on another chair and allow gravity to stretch your knee towards a more straightened position. Repeat 1 Time Hold 10 Minutes Complete 1 Set Perform 3 Times a Day  ASSESSMENT:  CLINICAL IMPRESSION: The patient presents to OPPT s/p right total knee replacement performed on 07/05/22. Rx focused on increased flexion and extension ROM as well as quad activation exs. Vaso end of session for edema control.       OBJECTIVE IMPAIRMENTS Abnormal gait, decreased activity tolerance, decreased ROM, decreased strength, increased edema, and pain.   ACTIVITY LIMITATIONS locomotion level  PARTICIPATION LIMITATIONS: cleaning and yard work  [employer] POTENTIAL: Excellent  CLINICAL DECISION MAKING: Stable/uncomplicated  EVALUATION COMPLEXITY: Low   GOALS: Goals reviewed with patient? Yes  SHORT TERM GOALS: Target date: 08/17/2022  Ind with an initial HEP. Baseline: Goal status: INITIAL  2.  Full active right knee extension. Baseline:  Goal status: INITIAL   LONG TERM GOALS: Target date: 09/14/2022   Ind with an advanced HEP. Baseline:  Goal status: INITIAL  2.  Active right knee flexion to 115 degrees+ so the patient can perform functional tasks and do so with pain not > 2-3/10. Baseline:  Goal status: INITIAL  3.  Increase right hip and knee strength to a solid 5/5 to provide good stability for accomplishment of functional activities. Baseline:  Goal status: INITIAL  4.  Perform a reciprocating stair gait with one railing with pain not >  2-3/10. Baseline:  Goal status: INITIAL  5.  Perform ADL's with pain not > 2-3/10. Baseline:  Goal status: INITIAL  PLAN: PT FREQUENCY: 2x/week  PT DURATION: 6 weeks  PLANNED INTERVENTIONS: Therapeutic exercises, Therapeutic activity, Neuromuscular re-education, Gait training, Patient/Family education, Self Care, Electrical stimulation, Cryotherapy, Moist heat, Vasopneumatic device, and Manual therapy  PLAN FOR NEXT SESSION: Nustep with progression to recumbent bike, PROM, PRE's.  Vasopneumatic.   Carrel Leather,CHRIS, PTA 08/03/2022, 2:28 PM

## 2022-08-05 ENCOUNTER — Encounter: Payer: Self-pay | Admitting: *Deleted

## 2022-08-05 ENCOUNTER — Ambulatory Visit: Payer: Medicare Other | Admitting: *Deleted

## 2022-08-05 DIAGNOSIS — M6281 Muscle weakness (generalized): Secondary | ICD-10-CM

## 2022-08-05 DIAGNOSIS — R600 Localized edema: Secondary | ICD-10-CM | POA: Diagnosis not present

## 2022-08-05 DIAGNOSIS — M25661 Stiffness of right knee, not elsewhere classified: Secondary | ICD-10-CM | POA: Diagnosis not present

## 2022-08-05 DIAGNOSIS — G8929 Other chronic pain: Secondary | ICD-10-CM | POA: Diagnosis not present

## 2022-08-05 DIAGNOSIS — M25561 Pain in right knee: Secondary | ICD-10-CM | POA: Diagnosis not present

## 2022-08-05 DIAGNOSIS — Z96651 Presence of right artificial knee joint: Secondary | ICD-10-CM | POA: Diagnosis not present

## 2022-08-05 NOTE — Therapy (Signed)
OUTPATIENT PHYSICAL THERAPY LOWER EXTREMITY TREATMENT   Patient Name: Jean Howell MRN: [id number] DOB:16-Aug-1956, 66 y.o., female Today's Date: 08/05/2022   PT End of Session - 08/05/22 1401     Visit Number 3    Number of Visits 12    Date for PT Re-Evaluation 10/26/22    Authorization Type FOTO AT LEAST EVERY 5TH VISIT.  PROGRESS NOTE AT 10TH VISIT.  KX MODIFIER AFTER 15 VISITS.    PT Start Time 1345    PT Stop Time 1440    PT Time Calculation (min) 55 min             Past Medical History:  Diagnosis Date   Arthritis    Depression    Hypertension    Rheumatoid arthritis ([place])    Past Surgical History:  Procedure Laterality Date   BREAST EXCISIONAL BIOPSY Bilateral    pt unsure when- benign   BREAST SURGERY     nodules removed   COLONOSCOPY N/A 06/25/2019   Procedure: COLONOSCOPY;  Surgeon: Danie Binder, MD;  [facility];  Service: Endoscopy;  Laterality: N/A;  1:30   MOLE REMOVAL Right 01/26/2022   foot (patient was put to sleep for procedure)   POLYPECTOMY  06/25/2019   Procedure: POLYPECTOMY;  Surgeon: Danie Binder, MD;  [facility];  Service: Endoscopy;;   TOTAL KNEE ARTHROPLASTY Right 07/05/2022   Procedure: RIGHT TOTAL KNEE ARTHROPLASTY;  Surgeon: Leandrew Koyanagi, MD;  Location: [facility];  Service: Orthopedics;  Laterality: Right;   Patient Active Problem List   Diagnosis Date Noted   Status post total right knee replacement 07/05/2022   Primary osteoarthritis of right knee 06/10/2022   Osteopenia of neck of right femur 03/16/2022   Full code status 06/16/2020   Impaired mobility and endurance 06/16/2020   Sciatica of left side 06/16/2020   Rheumatoid arthritis involving multiple sites with positive rheumatoid factor ([place]) 03/06/2019   Primary osteoarthritis of both knees 03/06/2019   Former smoker 03/06/2019   ANA positive 02/12/2019   Family history of thyroid disease 01/08/2019   Morbid obesity ([place]) 01/08/2019   Essential  hypertension 01/08/2019   Family history of colon cancer 01/08/2019    REFERRING PROVIDER: Frankey Shown MD  REFERRING DIAG: S/p right total knee replacement.  THERAPY DIAG:  Chronic pain of right knee  Stiffness of right knee, not elsewhere classified  Localized edema  Muscle weakness (generalized)  Rationale for Evaluation and Treatment Rehabilitation  ONSET DATE: 07/05/22 (surgery date).  SUBJECTIVE:   SUBJECTIVE STATEMENT: Pt arrived today doing fairly well with RT knee. She reports RT knee pain 2/10 PERTINENT HISTORY: OA, HTN, RA.  PAIN:  Are you having pain? Yes: NPRS scale: 2/10 Pain location: Right knee. Pain description: Ache, sharp. Aggravating factors: Walking. Relieving factors: Medication.  PRECAUTIONS: Other: Ultrasound.  WEIGHT BEARING RESTRICTIONS No  OBSERVATION:  Steri-strips remaining.  Incision appears to be healing well.  FALLS:  Has patient fallen in last 6 months? No  LIVING ENVIRONMENT: Lives with: lives with their spouse Lives in: House/apartment Stairs: Yes. Has following equipment at home: Single point cane  PLOF: Independent with basic ADLs  PATIENT GOALS Get around better with less right knee pain.   OBJECTIVE:     TODAY'S TREATMENT:  EXERCISE LOG        08-05-22   Exercise Repetitions and Resistance Comments  Nustep  L1 X 17  mins Seat 8,7 for flexion progression   Rocker board X3 mins DF/PF calf stretching   8in step lunge    LAQ 2x10 hold 5 secs 2#   QS with heel prop x10 with 5 sec holds   4in step up RT LE x12   HS curl seated x10    Blank cell = exercise not performed today  Manual:  STW to quads and HS's as well as extension stretch with quad sets . PROM for flexion with Active HS curl in sitting  Vasopneumatic on low x 10 minutes. 34 degrees for edema      PATIENT EDUCATION:  Education details: Below. Person educated: Patient Education method: Explanation,  Demonstration, and Handouts Education comprehension: verbalized understanding   HOME EXERCISE PROGRAM: [place] by Mali Applegate Aug 23rd, 2023 View at [email or web address] using code: [email or web address] Page 1 of 1 1 Exercise KNEE EXTENSION STRETCH - PROPPED While seated, prop your foot up on another chair and allow gravity to stretch your knee towards a more straightened position. Repeat 1 Time Hold 10 Minutes Complete 1 Set Perform 3 Times a Day  ASSESSMENT:  CLINICAL IMPRESSION: Pt arrived today doing fairly well with RT knee. She was able to continue with progression of exs with added wt. to LAQ's and started 4in step ups and did well. Improved flexion PROM to 105 degrees      OBJECTIVE IMPAIRMENTS Abnormal gait, decreased activity tolerance, decreased ROM, decreased strength, increased edema, and pain.   ACTIVITY LIMITATIONS locomotion level  PARTICIPATION LIMITATIONS: cleaning and yard work  [employer] POTENTIAL: Excellent  CLINICAL DECISION MAKING: Stable/uncomplicated  EVALUATION COMPLEXITY: Low   GOALS: Goals reviewed with patient? Yes  SHORT TERM GOALS: Target date: 08/19/2022  Ind with an initial HEP. Baseline: Goal status: INITIAL  2.  Full active right knee extension. Baseline:  Goal status: INITIAL   LONG TERM GOALS: Target date: 09/16/2022   Ind with an advanced HEP. Baseline:  Goal status: INITIAL  2.  Active right knee flexion to 115 degrees+ so the patient can perform functional tasks and do so with pain not > 2-3/10. Baseline:  Goal status: INITIAL  3.  Increase right hip and knee strength to a solid 5/5 to provide good stability for accomplishment of functional activities. Baseline:  Goal status: INITIAL  4.  Perform a reciprocating stair gait with one railing with pain not > 2-3/10. Baseline:  Goal status: INITIAL  5.  Perform ADL's with pain not > 2-3/10. Baseline:  Goal status: INITIAL  PLAN: PT  FREQUENCY: 2x/week  PT DURATION: 6 weeks  PLANNED INTERVENTIONS: Therapeutic exercises, Therapeutic activity, Neuromuscular re-education, Gait training, Patient/Family education, Self Care, Electrical stimulation, Cryotherapy, Moist heat, Vasopneumatic device, and Manual therapy  PLAN FOR NEXT SESSION: Nustep with progression to recumbent bike, PROM, PRE's.  Vasopneumatic.   Gloriann Riede,CHRIS, PTA 08/05/2022, 6:00 PM

## 2022-08-10 ENCOUNTER — Encounter: Payer: Self-pay | Admitting: *Deleted

## 2022-08-10 ENCOUNTER — Ambulatory Visit: Payer: Medicare Other | Attending: Orthopaedic Surgery | Admitting: *Deleted

## 2022-08-10 DIAGNOSIS — G8929 Other chronic pain: Secondary | ICD-10-CM | POA: Diagnosis not present

## 2022-08-10 DIAGNOSIS — R600 Localized edema: Secondary | ICD-10-CM | POA: Insufficient documentation

## 2022-08-10 DIAGNOSIS — M25661 Stiffness of right knee, not elsewhere classified: Secondary | ICD-10-CM | POA: Insufficient documentation

## 2022-08-10 DIAGNOSIS — M6281 Muscle weakness (generalized): Secondary | ICD-10-CM | POA: Insufficient documentation

## 2022-08-10 DIAGNOSIS — M25561 Pain in right knee: Secondary | ICD-10-CM | POA: Diagnosis not present

## 2022-08-10 NOTE — Therapy (Signed)
OUTPATIENT PHYSICAL THERAPY LOWER EXTREMITY TREATMENT   Patient Name: Jean Howell MRN: [id number] DOB:August 05, 1956, 66 y.o., female Today's Date: 08/10/2022   PT End of Session - 08/10/22 1414     Visit Number 4    Number of Visits 12    Date for PT Re-Evaluation 10/26/22    Authorization Type FOTO AT LEAST EVERY 5TH VISIT.  PROGRESS NOTE AT 10TH VISIT.  KX MODIFIER AFTER 15 VISITS.    PT Start Time 1345    PT Stop Time 1437    PT Time Calculation (min) 52 min             Past Medical History:  Diagnosis Date   Arthritis    Depression    Hypertension    Rheumatoid arthritis ([place])    Past Surgical History:  Procedure Laterality Date   BREAST EXCISIONAL BIOPSY Bilateral    pt unsure when- benign   BREAST SURGERY     nodules removed   COLONOSCOPY N/A 06/25/2019   Procedure: COLONOSCOPY;  Surgeon: Danie Binder, MD;  [facility];  Service: Endoscopy;  Laterality: N/A;  1:30   MOLE REMOVAL Right 01/26/2022   foot (patient was put to sleep for procedure)   POLYPECTOMY  06/25/2019   Procedure: POLYPECTOMY;  Surgeon: Danie Binder, MD;  [facility];  Service: Endoscopy;;   TOTAL KNEE ARTHROPLASTY Right 07/05/2022   Procedure: RIGHT TOTAL KNEE ARTHROPLASTY;  Surgeon: Leandrew Koyanagi, MD;  Location: [facility];  Service: Orthopedics;  Laterality: Right;   Patient Active Problem List   Diagnosis Date Noted   Status post total right knee replacement 07/05/2022   Primary osteoarthritis of right knee 06/10/2022   Osteopenia of neck of right femur 03/16/2022   Full code status 06/16/2020   Impaired mobility and endurance 06/16/2020   Sciatica of left side 06/16/2020   Rheumatoid arthritis involving multiple sites with positive rheumatoid factor ([place]) 03/06/2019   Primary osteoarthritis of both knees 03/06/2019   Former smoker 03/06/2019   ANA positive 02/12/2019   Family history of thyroid disease 01/08/2019   Morbid obesity ([place]) 01/08/2019   Essential  hypertension 01/08/2019   Family history of colon cancer 01/08/2019    REFERRING PROVIDER: Frankey Shown MD  REFERRING DIAG: S/p right total knee replacement.  THERAPY DIAG:  Chronic pain of right knee  Stiffness of right knee, not elsewhere classified  Localized edema  Muscle weakness (generalized)  Rationale for Evaluation and Treatment Rehabilitation  ONSET DATE: 07/05/22 (surgery date).  SUBJECTIVE:   SUBJECTIVE STATEMENT: Pt arrived today doing fairly well with RT knee. She reports RT knee pain 2/10 PERTINENT HISTORY: OA, HTN, RA.  PAIN:  Are you having pain? Yes: NPRS scale: 2/10 Pain location: Right knee. Pain description: Ache, sharp. Aggravating factors: Walking. Relieving factors: Medication.  PRECAUTIONS: Other: Ultrasound.  WEIGHT BEARING RESTRICTIONS No  OBSERVATION:  Steri-strips remaining.  Incision appears to be healing well.  FALLS:  Has patient fallen in last 6 months? No  LIVING ENVIRONMENT: Lives with: lives with their spouse Lives in: House/apartment Stairs: Yes. Has following equipment at home: Single point cane  PLOF: Independent with basic ADLs  PATIENT GOALS Get around better with less right knee pain.   OBJECTIVE:     TODAY'S TREATMENT:  EXERCISE LOG        08-05-22   Exercise Repetitions and Resistance Comments  Nustep  L3 X 16  mins Seat 8,7 for flexion progression   Rocker board    8in step lunge X 10 for flexion progression   LAQ 2x10 hold 5 secs 2#   QS with heel prop x10 with 5 sec holds   6in step up RT LE 2x10   HS curl seated         Blank cell = exercise not performed today  Manual:  STW to quads and HS's as well as extension stretch with quad sets .   Vasopneumatic on low x 15 minutes. 34 degrees for edema      PATIENT EDUCATION:  Education details: Below. Person educated: Patient Education method: Explanation, Demonstration, and Handouts Education  comprehension: verbalized understanding   HOME EXERCISE PROGRAM: [place] by Mali Applegate Aug 23rd, 2023 View at [email or web address] using code: [email or web address] Page 1 of 1 1 Exercise KNEE EXTENSION STRETCH - PROPPED While seated, prop your foot up on another chair and allow gravity to stretch your knee towards a more straightened position. Repeat 1 Time Hold 10 Minutes Complete 1 Set Perform 3 Times a Day  ASSESSMENT:  CLINICAL IMPRESSION: Pt arrived today doing fairly well with RT knee. She did well with progression of exs again, and able to perform 6 in step ups and did well. Improved flexion PROM to 110 degrees      OBJECTIVE IMPAIRMENTS Abnormal gait, decreased activity tolerance, decreased ROM, decreased strength, increased edema, and pain.   ACTIVITY LIMITATIONS locomotion level  PARTICIPATION LIMITATIONS: cleaning and yard work  [employer] POTENTIAL: Excellent  CLINICAL DECISION MAKING: Stable/uncomplicated  EVALUATION COMPLEXITY: Low   GOALS: Goals reviewed with patient? Yes  SHORT TERM GOALS: Target date: 08/24/2022  Ind with an initial HEP. Baseline: Goal status: INITIAL  2.  Full active right knee extension. Baseline:  Goal status: INITIAL   LONG TERM GOALS: Target date: 09/21/2022   Ind with an advanced HEP. Baseline:  Goal status: INITIAL  2.  Active right knee flexion to 115 degrees+ so the patient can perform functional tasks and do so with pain not > 2-3/10. Baseline:  Goal status: INITIAL  3.  Increase right hip and knee strength to a solid 5/5 to provide good stability for accomplishment of functional activities. Baseline:  Goal status: INITIAL  4.  Perform a reciprocating stair gait with one railing with pain not > 2-3/10. Baseline:  Goal status: INITIAL  5.  Perform ADL's with pain not > 2-3/10. Baseline:  Goal status: INITIAL  PLAN: PT FREQUENCY: 2x/week  PT DURATION: 6 weeks  PLANNED  INTERVENTIONS: Therapeutic exercises, Therapeutic activity, Neuromuscular re-education, Gait training, Patient/Family education, Self Care, Electrical stimulation, Cryotherapy, Moist heat, Vasopneumatic device, and Manual therapy  PLAN FOR NEXT SESSION: Nustep with progression to recumbent bike, PROM, PRE's.  Vasopneumatic.   Diasha Castleman,CHRIS, PTA 08/10/2022, 2:49 PM

## 2022-08-12 ENCOUNTER — Ambulatory Visit: Payer: Medicare Other

## 2022-08-12 DIAGNOSIS — M6281 Muscle weakness (generalized): Secondary | ICD-10-CM | POA: Diagnosis not present

## 2022-08-12 DIAGNOSIS — M25661 Stiffness of right knee, not elsewhere classified: Secondary | ICD-10-CM | POA: Diagnosis not present

## 2022-08-12 DIAGNOSIS — G8929 Other chronic pain: Secondary | ICD-10-CM

## 2022-08-12 DIAGNOSIS — M25561 Pain in right knee: Secondary | ICD-10-CM | POA: Diagnosis not present

## 2022-08-12 DIAGNOSIS — R600 Localized edema: Secondary | ICD-10-CM | POA: Diagnosis not present

## 2022-08-12 NOTE — Therapy (Signed)
OUTPATIENT PHYSICAL THERAPY LOWER EXTREMITY TREATMENT   Patient Name: Jean Howell MRN: [id number] DOB:Dec 02, 1956, 66 y.o., female Today's Date: 08/12/2022   PT End of Session - 08/12/22 1332     Visit Number 5    Number of Visits 12    Date for PT Re-Evaluation 10/26/22    Authorization Type FOTO AT LEAST EVERY 5TH VISIT.  PROGRESS NOTE AT 10TH VISIT.  KX MODIFIER AFTER 15 VISITS.    PT Start Time 1331    PT Stop Time 1445    PT Time Calculation (min) 74 min    Activity Tolerance Patient tolerated treatment well    Behavior During Therapy WFL for tasks assessed/performed              Past Medical History:  Diagnosis Date   Arthritis    Depression    Hypertension    Rheumatoid arthritis ([place])    Past Surgical History:  Procedure Laterality Date   BREAST EXCISIONAL BIOPSY Bilateral    pt unsure when- benign   BREAST SURGERY     nodules removed   COLONOSCOPY N/A 06/25/2019   Procedure: COLONOSCOPY;  Surgeon: Danie Binder, MD;  [facility];  Service: Endoscopy;  Laterality: N/A;  1:30   MOLE REMOVAL Right 01/26/2022   foot (patient was put to sleep for procedure)   POLYPECTOMY  06/25/2019   Procedure: POLYPECTOMY;  Surgeon: Danie Binder, MD;  [facility];  Service: Endoscopy;;   TOTAL KNEE ARTHROPLASTY Right 07/05/2022   Procedure: RIGHT TOTAL KNEE ARTHROPLASTY;  Surgeon: Leandrew Koyanagi, MD;  Location: [facility];  Service: Orthopedics;  Laterality: Right;   Patient Active Problem List   Diagnosis Date Noted   Status post total right knee replacement 07/05/2022   Primary osteoarthritis of right knee 06/10/2022   Osteopenia of neck of right femur 03/16/2022   Full code status 06/16/2020   Impaired mobility and endurance 06/16/2020   Sciatica of left side 06/16/2020   Rheumatoid arthritis involving multiple sites with positive rheumatoid factor ([place]) 03/06/2019   Primary osteoarthritis of both knees 03/06/2019   Former smoker 03/06/2019    ANA positive 02/12/2019   Family history of thyroid disease 01/08/2019   Morbid obesity ([place]) 01/08/2019   Essential hypertension 01/08/2019   Family history of colon cancer 01/08/2019    REFERRING PROVIDER: Frankey Shown MD  REFERRING DIAG: S/p right total knee replacement.  THERAPY DIAG:  Chronic pain of right knee  Stiffness of right knee, not elsewhere classified  Localized edema  Muscle weakness (generalized)  Rationale for Evaluation and Treatment Rehabilitation  ONSET DATE: 07/05/22 (surgery date).  SUBJECTIVE:   SUBJECTIVE STATEMENT: Patient reports that her knee feels good today. However, her ankle is bothering her a more today.   PERTINENT HISTORY: OA, HTN, RA.  PAIN:  Are you having pain? Yes: NPRS scale: 2/10 Pain location: Right knee. Pain description: Ache, sharp. Aggravating factors: Walking. Relieving factors: Medication.  PRECAUTIONS: Other: Ultrasound.  WEIGHT BEARING RESTRICTIONS No  OBSERVATION:  Steri-strips remaining.  Incision appears to be healing well.  FALLS:  Has patient fallen in last 6 months? No  LIVING ENVIRONMENT: Lives with: lives with their spouse Lives in: House/apartment Stairs: Yes. Has following equipment at home: Single point cane  PLOF: Independent with basic ADLs  PATIENT GOALS Get around better with less right knee pain.   OBJECTIVE:     TODAY'S TREATMENT:  9/7 EXERCISE LOG  Exercise Repetitions and Resistance Comments  Nustep L3 x 15 minutes; seat 7-6   Recumbent bike L1 x 5 minutes Full revolutions  Rocker board  4 minutes   LAQ 2 minutes @ 4 lbs w/ 3 second hold RLE only   Seated HS stretch 4 x 30 seconds RLE only   Seated HS curl Green t-band x 3 minutes   Quad set w/ therapist overpressure  20 reps w/ 5 second hold    Blank cell = exercise not performed today Manual Therapy Soft Tissue Mobilization: right quadriceps and hamstrings, for improved soft tissue  extensibility and soreness Passive ROM: flexion and extension, to tolerance    Right knee AROM:  Extension: 4 degrees from neutral  Flexion: 126 degrees                                    EXERCISE LOG        08-05-22   Exercise Repetitions and Resistance Comments  Nustep  L3 X 16  mins Seat 8,7 for flexion progression   Rocker board    8in step lunge X 10 for flexion progression   LAQ 2x10 hold 5 secs 2#   QS with heel prop x10 with 5 sec holds   6in step up RT LE 2x10   HS curl seated         Blank cell = exercise not performed today  Manual:  STW to quads and HS's as well as extension stretch with quad sets .   Vasopneumatic on low x 15 minutes. 34 degrees for edema      PATIENT EDUCATION:  Education details: Below. Person educated: Patient Education method: Explanation, Demonstration, and Handouts Education comprehension: verbalized understanding   HOME EXERCISE PROGRAM: [place] by Mali Applegate Aug 23rd, 2023 View at [email or web address] using code: [email or web address] Page 1 of 1 1 Exercise KNEE EXTENSION STRETCH - PROPPED While seated, prop your foot up on another chair and allow gravity to stretch your knee towards a more straightened position. Repeat 1 Time Hold 10 Minutes Complete 1 Set Perform 3 Times a Day  ASSESSMENT:  CLINICAL IMPRESSION: Patient was progressed with multiple new and seated interventions for improved lower extremity strength and mobility. She required minimal cueing with seated hamstring extension for proper biomechanics to facilitate improved hamstring extensibility. She experienced no significant pain or discomfort with any of today's interventions. She reported feeling good upon the conclusion of treatment. She continues to require skilled physical therapy to address her remaining impairments to maximize her functional mobility.    OBJECTIVE IMPAIRMENTS Abnormal gait, decreased activity tolerance, decreased ROM,  decreased strength, increased edema, and pain.   ACTIVITY LIMITATIONS locomotion level  PARTICIPATION LIMITATIONS: cleaning and yard work  [employer] POTENTIAL: Excellent  CLINICAL DECISION MAKING: Stable/uncomplicated  EVALUATION COMPLEXITY: Low   GOALS: Goals reviewed with patient? Yes  SHORT TERM GOALS: Target date: 08/26/2022  Ind with an initial HEP. Baseline: Goal status: INITIAL  2.  Full active right knee extension. Baseline:  Goal status: IN PROGRESS   LONG TERM GOALS: Target date: 09/23/2022   Ind with an advanced HEP. Baseline:  Goal status: INITIAL  2.  Active right knee flexion to 115 degrees+ so the patient can perform functional tasks and do so with pain not > 2-3/10. Baseline:  Goal status: MET  3.  Increase right hip and knee strength to a solid  5/5 to provide good stability for accomplishment of functional activities. Baseline:  Goal status: INITIAL  4.  Perform a reciprocating stair gait with one railing with pain not > 2-3/10. Baseline:  Goal status: INITIAL  5.  Perform ADL's with pain not > 2-3/10. Baseline:  Goal status: INITIAL  PLAN: PT FREQUENCY: 2x/week  PT DURATION: 6 weeks  PLANNED INTERVENTIONS: Therapeutic exercises, Therapeutic activity, Neuromuscular re-education, Gait training, Patient/Family education, Self Care, Electrical stimulation, Cryotherapy, Moist heat, Vasopneumatic device, and Manual therapy  PLAN FOR NEXT SESSION: Nustep with progression to recumbent bike, PROM, PRE's.  Vasopneumatic.   Darlin Coco, PT 08/12/2022, 4:25 PM

## 2022-08-17 ENCOUNTER — Ambulatory Visit (INDEPENDENT_AMBULATORY_CARE_PROVIDER_SITE_OTHER): Payer: Medicare Other

## 2022-08-17 ENCOUNTER — Ambulatory Visit (INDEPENDENT_AMBULATORY_CARE_PROVIDER_SITE_OTHER): Payer: Medicare Other | Admitting: Physician Assistant

## 2022-08-17 ENCOUNTER — Encounter: Payer: Self-pay | Admitting: Physician Assistant

## 2022-08-17 ENCOUNTER — Ambulatory Visit: Payer: Medicare Other | Attending: Physician Assistant | Admitting: Physician Assistant

## 2022-08-17 VITALS — BP 135/91 | HR 89 | Resp 14 | Ht 64.0 in | Wt 211.4 lb

## 2022-08-17 DIAGNOSIS — Z87891 Personal history of nicotine dependence: Secondary | ICD-10-CM | POA: Diagnosis not present

## 2022-08-17 DIAGNOSIS — Z96651 Presence of right artificial knee joint: Secondary | ICD-10-CM | POA: Diagnosis not present

## 2022-08-17 DIAGNOSIS — Z8349 Family history of other endocrine, nutritional and metabolic diseases: Secondary | ICD-10-CM

## 2022-08-17 DIAGNOSIS — Z79899 Other long term (current) drug therapy: Secondary | ICD-10-CM | POA: Diagnosis not present

## 2022-08-17 DIAGNOSIS — M1712 Unilateral primary osteoarthritis, left knee: Secondary | ICD-10-CM | POA: Diagnosis not present

## 2022-08-17 DIAGNOSIS — Z8659 Personal history of other mental and behavioral disorders: Secondary | ICD-10-CM

## 2022-08-17 DIAGNOSIS — M170 Bilateral primary osteoarthritis of knee: Secondary | ICD-10-CM

## 2022-08-17 DIAGNOSIS — I10 Essential (primary) hypertension: Secondary | ICD-10-CM

## 2022-08-17 DIAGNOSIS — R5383 Other fatigue: Secondary | ICD-10-CM

## 2022-08-17 DIAGNOSIS — Z800 Family history of malignant neoplasm of digestive organs: Secondary | ICD-10-CM

## 2022-08-17 DIAGNOSIS — R768 Other specified abnormal immunological findings in serum: Secondary | ICD-10-CM

## 2022-08-17 DIAGNOSIS — M0579 Rheumatoid arthritis with rheumatoid factor of multiple sites without organ or systems involvement: Secondary | ICD-10-CM | POA: Diagnosis not present

## 2022-08-17 NOTE — Progress Notes (Signed)
Post-Op Visit Note   Patient: Jean Howell           Date of Birth: February 15, 1956           MRN: [id number] Visit Date: 08/17/2022 PCP: [name], DO   Assessment & Plan:  Chief Complaint:  Chief Complaint  Patient presents with   Right Knee - Routine Post Op   Visit Diagnoses:  1. Status post total right knee replacement     Plan: Patient is a pleasant 66 year old female who comes in today 6 weeks status post right total knee replacement 07/05/2022.  She has been doing well.  Her knee feels great.  She is still in physical therapy making great progress.  She think she has been having blood pressure issues and notes a syncopal episode this past Sunday.  No increased pain to the right knee since.  Examination of her right knee reveals a fully healed surgical scar without complication.  Range of motion 0 to 130 degrees.  She is stable valgus varus stress.  She is neurovascular tact distally.  At this point, she would like to continue with therapy to work on strengthening exercises.  She may discontinue her baby aspirin twice daily for DVT prophylaxis.  Dental prophylaxis reinforced.  Follow-up in 6 weeks for recheck.  Call with concerns or questions.  Follow-Up Instructions: Return in about 6 weeks (around 09/28/2022).   Orders:  Orders Placed This Encounter  Procedures   XR Knee 1-2 Views Right   No orders of the defined types were placed in this encounter.   Imaging: XR Knee 1-2 Views Right  Result Date: 08/17/2022 Well-seated prosthesis without complication   PMFS History: Patient Active Problem List   Diagnosis Date Noted   Status post total right knee replacement 07/05/2022   Primary osteoarthritis of right knee 06/10/2022   Osteopenia of neck of right femur 03/16/2022   Full code status 06/16/2020   Impaired mobility and endurance 06/16/2020   Sciatica of left side 06/16/2020   Rheumatoid arthritis involving multiple sites with positive rheumatoid factor  ([place]) 03/06/2019   Primary osteoarthritis of both knees 03/06/2019   Former smoker 03/06/2019   ANA positive 02/12/2019   Family history of thyroid disease 01/08/2019   Morbid obesity ([place]) 01/08/2019   Essential hypertension 01/08/2019   Family history of colon cancer 01/08/2019   Past Medical History:  Diagnosis Date   Arthritis    Depression    Hypertension    Rheumatoid arthritis ([place])     Family History  Problem Relation Age of Onset   Cancer Father    Colon cancer Father    Thyroid disease Mother    CVA Mother 56   Thyroid disease Sister    Healthy Son    Congestive Heart Failure Sister    Hypertension Sister    Diabetes Sister    Hypertension Sister    Diabetes Sister    Hypertension Sister    Hypertension Sister    Cancer Brother        kidney     Past Surgical History:  Procedure Laterality Date   BREAST EXCISIONAL BIOPSY Bilateral    pt unsure when- benign   BREAST SURGERY     nodules removed   COLONOSCOPY N/A 06/25/2019   Procedure: COLONOSCOPY;  Surgeon: Danie Binder, MD;  [facility];  Service: Endoscopy;  Laterality: N/A;  1:30   MOLE REMOVAL Right 01/26/2022   foot (patient was put to sleep for  procedure)   POLYPECTOMY  06/25/2019   Procedure: POLYPECTOMY;  Surgeon: Danie Binder, MD;  [facility];  Service: Endoscopy;;   TOTAL KNEE ARTHROPLASTY Right 07/05/2022   Procedure: RIGHT TOTAL KNEE ARTHROPLASTY;  Surgeon: Leandrew Koyanagi, MD;  Location: [facility];  Service: Orthopedics;  Laterality: Right;   Social History   Occupational History   Occupation: retired  Tobacco Use   Smoking status: Former    Packs/day: 1.00    Years: 15.00    Total pack years: 15.00    Types: Cigarettes    Quit date: 2000    Years since quitting: 23.7    Passive exposure: Never   Smokeless tobacco: Never  Vaping Use   Vaping Use: Never used  Substance and Sexual Activity   Alcohol use: Not Currently   Drug use: Never   Sexual activity:  Not Currently    Comment: widowed

## 2022-08-18 ENCOUNTER — Telehealth: Payer: Self-pay | Admitting: Family Medicine

## 2022-08-18 NOTE — Telephone Encounter (Signed)
Lmtcb- in the note michelle documented no changed was made so she should still be taking losartan 1 tablet a day and monitoring her bp.

## 2022-08-19 ENCOUNTER — Ambulatory Visit: Payer: Medicare Other

## 2022-08-19 ENCOUNTER — Telehealth: Payer: Self-pay | Admitting: *Deleted

## 2022-08-19 DIAGNOSIS — G8929 Other chronic pain: Secondary | ICD-10-CM

## 2022-08-19 DIAGNOSIS — M25661 Stiffness of right knee, not elsewhere classified: Secondary | ICD-10-CM | POA: Diagnosis not present

## 2022-08-19 DIAGNOSIS — R600 Localized edema: Secondary | ICD-10-CM | POA: Diagnosis not present

## 2022-08-19 DIAGNOSIS — M6281 Muscle weakness (generalized): Secondary | ICD-10-CM

## 2022-08-19 DIAGNOSIS — M25561 Pain in right knee: Secondary | ICD-10-CM | POA: Diagnosis not present

## 2022-08-19 NOTE — Therapy (Signed)
OUTPATIENT PHYSICAL THERAPY LOWER EXTREMITY TREATMENT   Patient Name: Jean Howell MRN: [id number] DOB:1956/08/28, 66 y.o., female Today's Date: 08/19/2022   PT End of Session - 08/19/22 1344     Visit Number 6    Number of Visits 12    Date for PT Re-Evaluation 10/26/22    Authorization Type FOTO AT LEAST EVERY 5TH VISIT.  PROGRESS NOTE AT 10TH VISIT.  KX MODIFIER AFTER 15 VISITS.    PT Start Time 1345    Activity Tolerance Patient tolerated treatment well    Behavior During Therapy WFL for tasks assessed/performed               Past Medical History:  Diagnosis Date   Arthritis    Depression    Hypertension    Rheumatoid arthritis ([place])    Past Surgical History:  Procedure Laterality Date   BREAST EXCISIONAL BIOPSY Bilateral    pt unsure when- benign   BREAST SURGERY     nodules removed   COLONOSCOPY N/A 06/25/2019   Procedure: COLONOSCOPY;  Surgeon: Danie Binder, MD;  [facility];  Service: Endoscopy;  Laterality: N/A;  1:30   MOLE REMOVAL Right 01/26/2022   foot (patient was put to sleep for procedure)   POLYPECTOMY  06/25/2019   Procedure: POLYPECTOMY;  Surgeon: Danie Binder, MD;  [facility];  Service: Endoscopy;;   TOTAL KNEE ARTHROPLASTY Right 07/05/2022   Procedure: RIGHT TOTAL KNEE ARTHROPLASTY;  Surgeon: Leandrew Koyanagi, MD;  Location: [facility];  Service: Orthopedics;  Laterality: Right;   Patient Active Problem List   Diagnosis Date Noted   Status post total right knee replacement 07/05/2022   Primary osteoarthritis of right knee 06/10/2022   Osteopenia of neck of right femur 03/16/2022   Full code status 06/16/2020   Impaired mobility and endurance 06/16/2020   Sciatica of left side 06/16/2020   Rheumatoid arthritis involving multiple sites with positive rheumatoid factor ([place]) 03/06/2019   Primary osteoarthritis of both knees 03/06/2019   Former smoker 03/06/2019   ANA positive 02/12/2019   Family history of thyroid  disease 01/08/2019   Morbid obesity ([place]) 01/08/2019   Essential hypertension 01/08/2019   Family history of colon cancer 01/08/2019    REFERRING PROVIDER: Frankey Shown MD  REFERRING DIAG: S/p right total knee replacement.  THERAPY DIAG:  Chronic pain of right knee  Stiffness of right knee, not elsewhere classified  Localized edema  Muscle weakness (generalized)  Rationale for Evaluation and Treatment Rehabilitation  ONSET DATE: 07/05/22 (surgery date).  SUBJECTIVE:   SUBJECTIVE STATEMENT: Patient reports that she feels alright today. However, she notes that she "passed out" on Sunday, but she went to her doctor yesterday. She had a x-ray on her leg and was told everything was ok.   PERTINENT HISTORY: OA, HTN, RA.  PAIN:  Are you having pain? Yes: NPRS scale: 4/10 Pain location: Right knee. Pain description: Ache, sharp. Aggravating factors: Walking. Relieving factors: Medication.  PRECAUTIONS: Other: Ultrasound.  WEIGHT BEARING RESTRICTIONS No  OBSERVATION:  Steri-strips remaining.  Incision appears to be healing well.  FALLS:  Has patient fallen in last 6 months? No  LIVING ENVIRONMENT: Lives with: lives with their spouse Lives in: House/apartment Stairs: Yes. Has following equipment at home: Single point cane  PLOF: Independent with basic ADLs  PATIENT GOALS Get around better with less right knee pain.   OBJECTIVE:     TODAY'S TREATMENT:  9/14 EXERCISE LOG  Exercise Repetitions and Resistance Comments  Nustep L3 x 10 minutes; seat 6   Recumbent bike  L1 x 5 minutes; seat 7 0.62 miles  LAQ 5 lbs x 3 minutes   Rocker board 2 minutes   Standing hip ABD 2 minutes    Blank cell = exercise not performed today  Manual Therapy Soft Tissue Mobilization: right quadriceps, IT band, and hamstrings, for reduced pain and improved knee mobility Joint Mobilizations: patellar , grade I-IV Passive ROM: flexion and  extension, to tolerance   Modalities  Date:  Vaso: Knee, 34 degrees; low pressure, 15 mins, Pain and Edema                                   9/7 EXERCISE LOG  Exercise Repetitions and Resistance Comments  Nustep L3 x 15 minutes; seat 7-6   Recumbent bike L1 x 5 minutes Full revolutions  Rocker board  4 minutes   LAQ 2 minutes @ 4 lbs w/ 3 second hold RLE only   Seated HS stretch 4 x 30 seconds RLE only   Seated HS curl Green t-band x 3 minutes   Quad set w/ therapist overpressure  20 reps w/ 5 second hold    Blank cell = exercise not performed today Manual Therapy Soft Tissue Mobilization: right quadriceps and hamstrings, for improved soft tissue extensibility and soreness Passive ROM: flexion and extension, to tolerance    Right knee AROM:  Extension: 4 degrees from neutral  Flexion: 126 degrees                                    EXERCISE LOG        08-05-22   Exercise Repetitions and Resistance Comments  Nustep  L3 X 16  mins Seat 8,7 for flexion progression   Rocker board    8in step lunge X 10 for flexion progression   LAQ 2x10 hold 5 secs 2#   QS with heel prop x10 with 5 sec holds   6in step up RT LE 2x10   HS curl seated         Blank cell = exercise not performed today  Manual:  STW to quads and HS's as well as extension stretch with quad sets .   Vasopneumatic on low x 15 minutes. 34 degrees for edema  PATIENT EDUCATION:  Education details: Below. Person educated: Patient Education method: Explanation, Demonstration, and Handouts Education comprehension: verbalized understanding   HOME EXERCISE PROGRAM: [place] by Mali Applegate Aug 23rd, 2023 View at [email or web address] using code: [id number] Page 1 of 1 1 Exercise KNEE EXTENSION STRETCH - PROPPED While seated, prop your foot up on another chair and allow gravity to stretch your knee towards a more straightened position. Repeat 1 Time Hold 10 Minutes Complete 1 Set  Perform 3 Times a Day  ASSESSMENT:  CLINICAL IMPRESSION: Treatment focused on familiar interventions for improved knee strength and mobility. She required minimal cueing with standing hip abduction to facilitate knee stability and hip abductor engagement. She was limited with standing interventions due to increased low back pain. Manual therapy focused on improved knee mobility through the use of soft tissue mobilization and right knee PROM. She reported feeling alright upon the conclusion of treatment. She continues to require skilled physical therapy to address her  remaining impairments to return her prior level of function.    OBJECTIVE IMPAIRMENTS Abnormal gait, decreased activity tolerance, decreased ROM, decreased strength, increased edema, and pain.   ACTIVITY LIMITATIONS locomotion level  PARTICIPATION LIMITATIONS: cleaning and yard work  [employer] POTENTIAL: Excellent  CLINICAL DECISION MAKING: Stable/uncomplicated  EVALUATION COMPLEXITY: Low   GOALS: Goals reviewed with patient? Yes  SHORT TERM GOALS: Target date: 08/26/2022  Ind with an initial HEP. Baseline: Goal status: INITIAL  2.  Full active right knee extension. Baseline:  Goal status: IN PROGRESS   LONG TERM GOALS: Target date: 09/23/2022   Ind with an advanced HEP. Baseline:  Goal status: INITIAL  2.  Active right knee flexion to 115 degrees+ so the patient can perform functional tasks and do so with pain not > 2-3/10. Baseline:  Goal status: MET  3.  Increase right hip and knee strength to a solid 5/5 to provide good stability for accomplishment of functional activities. Baseline:  Goal status: INITIAL  4.  Perform a reciprocating stair gait with one railing with pain not > 2-3/10. Baseline:  Goal status: INITIAL  5.  Perform ADL's with pain not > 2-3/10. Baseline:  Goal status: INITIAL  PLAN: PT FREQUENCY: 2x/week  PT DURATION: 6 weeks  PLANNED INTERVENTIONS: Therapeutic exercises,  Therapeutic activity, Neuromuscular re-education, Gait training, Patient/Family education, Self Care, Electrical stimulation, Cryotherapy, Moist heat, Vasopneumatic device, and Manual therapy  PLAN FOR NEXT SESSION: Nustep with progression to recumbent bike, PROM, PRE's.  Vasopneumatic.   Darlin Coco, PT 08/19/2022, 1:50 PM

## 2022-08-19 NOTE — Telephone Encounter (Signed)
Ortho bundle call and survey completed. (08/17/22 appointment in office).

## 2022-08-23 ENCOUNTER — Telehealth: Payer: Self-pay | Admitting: Rheumatology

## 2022-08-23 NOTE — Telephone Encounter (Signed)
Ok to resume enbrel since she only had a brief gap in therapy around the time of her knee replacement on 07/05/22

## 2022-08-23 NOTE — Telephone Encounter (Unsigned)
Patient called stating she is experiencing a lot of pain and wants to restart Enbrel.  Patient requested a return call.

## 2022-08-23 NOTE — Telephone Encounter (Signed)
Attempted to contact the patient and left message for patient to call the office.  

## 2022-08-24 ENCOUNTER — Encounter: Payer: Self-pay | Admitting: Physical Therapy

## 2022-08-24 ENCOUNTER — Ambulatory Visit: Payer: Medicare Other | Admitting: Physical Therapy

## 2022-08-24 DIAGNOSIS — M25661 Stiffness of right knee, not elsewhere classified: Secondary | ICD-10-CM | POA: Diagnosis not present

## 2022-08-24 DIAGNOSIS — R600 Localized edema: Secondary | ICD-10-CM

## 2022-08-24 DIAGNOSIS — M6281 Muscle weakness (generalized): Secondary | ICD-10-CM | POA: Diagnosis not present

## 2022-08-24 DIAGNOSIS — G8929 Other chronic pain: Secondary | ICD-10-CM

## 2022-08-24 DIAGNOSIS — M25561 Pain in right knee: Secondary | ICD-10-CM | POA: Diagnosis not present

## 2022-08-24 NOTE — Therapy (Signed)
OUTPATIENT PHYSICAL THERAPY LOWER EXTREMITY TREATMENT   Patient Name: Jean Howell MRN: [id number] DOB:1956/09/08, 66 y.o., female Today's Date: 08/24/2022   PT End of Session - 08/24/22 1405     Visit Number 7    Number of Visits 12    Date for PT Re-Evaluation 10/26/22    Authorization Type FOTO AT LEAST EVERY 5TH VISIT.  PROGRESS NOTE AT 10TH VISIT.  KX MODIFIER AFTER 15 VISITS.    PT Start Time 1403    PT Stop Time 1455    PT Time Calculation (min) 52 min    Activity Tolerance Patient tolerated treatment well    Behavior During Therapy WFL for tasks assessed/performed               Past Medical History:  Diagnosis Date   Arthritis    Depression    Hypertension    Rheumatoid arthritis ([place])    Past Surgical History:  Procedure Laterality Date   BREAST EXCISIONAL BIOPSY Bilateral    pt unsure when- benign   BREAST SURGERY     nodules removed   COLONOSCOPY N/A 06/25/2019   Procedure: COLONOSCOPY;  Surgeon: Danie Binder, MD;  [facility];  Service: Endoscopy;  Laterality: N/A;  1:30   MOLE REMOVAL Right 01/26/2022   foot (patient was put to sleep for procedure)   POLYPECTOMY  06/25/2019   Procedure: POLYPECTOMY;  Surgeon: Danie Binder, MD;  [facility];  Service: Endoscopy;;   TOTAL KNEE ARTHROPLASTY Right 07/05/2022   Procedure: RIGHT TOTAL KNEE ARTHROPLASTY;  Surgeon: Leandrew Koyanagi, MD;  Location: [facility];  Service: Orthopedics;  Laterality: Right;   Patient Active Problem List   Diagnosis Date Noted   Status post total right knee replacement 07/05/2022   Primary osteoarthritis of right knee 06/10/2022   Osteopenia of neck of right femur 03/16/2022   Full code status 06/16/2020   Impaired mobility and endurance 06/16/2020   Sciatica of left side 06/16/2020   Rheumatoid arthritis involving multiple sites with positive rheumatoid factor ([place]) 03/06/2019   Primary osteoarthritis of both knees 03/06/2019   Former smoker 03/06/2019    ANA positive 02/12/2019   Family history of thyroid disease 01/08/2019   Morbid obesity ([place]) 01/08/2019   Essential hypertension 01/08/2019   Family history of colon cancer 01/08/2019    REFERRING PROVIDER: Frankey Shown MD  REFERRING DIAG: S/p right total knee replacement.  THERAPY DIAG:  Chronic pain of right knee  Stiffness of right knee, not elsewhere classified  Localized edema  Muscle weakness (generalized)  Rationale for Evaluation and Treatment Rehabilitation  ONSET DATE: 07/05/22 (surgery date).  SUBJECTIVE:   SUBJECTIVE STATEMENT: Reports her knee is doing okay today but having more pain in wrists, ankles from OA.  PERTINENT HISTORY: OA, HTN, RA.  PAIN:  Are you having pain? Yes: NPRS scale: 2/10 Pain location: Right knee. Pain description: Soreness Aggravating factors: Walking. Relieving factors: Medication.  PRECAUTIONS: Other: Ultrasound.  WEIGHT BEARING RESTRICTIONS No  OBJECTIVE:   AROM Right 08/24/2022 Left   Hip flexion    Hip extension    Hip abduction    Hip adduction    Hip internal rotation    Hip external rotation    Knee flexion 125   Knee extension 2   Ankle dorsiflexion    Ankle plantarflexion    Ankle inversion    Ankle eversion     (Blank rows = not tested)  TODAY'S TREATMENT:  9/14 EXERCISE LOG  Exercise Repetitions and Resistance Comments  Nustep L3 x 10 minutes; seat 8   Recumbent bike  L1 x 8 minutes; seat 9   LAQ 5 lbs x 4 minutes   Rocker board 2 minutes Began feeling hot so rest required       Blank cell = exercise not performed today   Modalities  Date: 08/24/2022 Vaso: Knee, 34 degrees; low pressure, 15 mins, Pain and Edema  PATIENT EDUCATION:  Education details: Below. Person educated: Patient Education method: Explanation, Demonstration, and Handouts Education comprehension: verbalized understanding   HOME EXERCISE PROGRAM: [place] by Mali Applegate Aug 23rd, 2023 View at [email or web address] using code: [email or web address] Page 1 of 1 1 Exercise KNEE EXTENSION STRETCH - PROPPED While seated, prop your foot up on another chair and allow gravity to stretch your knee towards a more straightened position. Repeat 1 Time Hold 10 Minutes Complete 1 Set Perform 3 Times a Day  ASSESSMENT:  CLINICAL IMPRESSION: Patient reported less knee pain upon arrival today compared to wrists and ankles. Patient able to only tolerate seat nine on stationary bike today. Rest break required during rockerboard as she felt like she was getting overheated. A seated rest break was provided. The patient also indicated that she passed out at home recently but did not fall. States that she came to draped over her walker at home and had a L black eye from the incident. Patient's R knee AROM measured as 2-125 deg today. Normal vasopneumatic response noted following removal of the modality.   OBJECTIVE IMPAIRMENTS Abnormal gait, decreased activity tolerance, decreased ROM, decreased strength, increased edema, and pain.   ACTIVITY LIMITATIONS locomotion level  PARTICIPATION LIMITATIONS: cleaning and yard work  [employer] POTENTIAL: Excellent  CLINICAL DECISION MAKING: Stable/uncomplicated  EVALUATION COMPLEXITY: Low   GOALS: Goals reviewed with patient? Yes  SHORT TERM GOALS: Target date: 08/26/2022  Ind with an initial HEP. Baseline: Goal status: NOT MET  2.  Full active right knee extension. Baseline:  Goal status: ON-GOING   LONG TERM GOALS: Target date: 09/23/2022   Ind with an advanced HEP. Baseline:  Goal status: ON-GOING  2.  Active right knee flexion to 115 degrees+ so the patient can perform functional tasks and do so with pain not > 2-3/10. Baseline:  Goal status: MET  3.  Increase right hip and knee strength to a solid 5/5 to provide good stability for accomplishment of functional activities. Baseline:  Goal  status: ON-GOING  4.  Perform a reciprocating stair gait with one railing with pain not > 2-3/10. Baseline:  Goal status: ON-GOING  5.  Perform ADL's with pain not > 2-3/10. Baseline:  Goal status: ON-GOING  PLAN: PT FREQUENCY: 2x/week  PT DURATION: 6 weeks  PLANNED INTERVENTIONS: Therapeutic exercises, Therapeutic activity, Neuromuscular re-education, Gait training, Patient/Family education, Self Care, Electrical stimulation, Cryotherapy, Moist heat, Vasopneumatic device, and Manual therapy  PLAN FOR NEXT SESSION: Nustep with progression to recumbent bike, PROM, PRE's.  Vasopneumatic.   Standley Brooking, PTA 08/24/2022, 3:12 PM

## 2022-08-24 NOTE — Telephone Encounter (Signed)
Patient advised ok to resume enbrel since she only had a brief gap in therapy around the time of her knee replacement on 07/05/22. Patient states she has one pen on hand and will need a refill. Prescription was sent in on 07/06/2022. Patient should have a 90 day supply at the pharmacy. Patient will contact the pharmacy for refill.

## 2022-08-26 ENCOUNTER — Ambulatory Visit: Payer: Medicare Other

## 2022-08-26 DIAGNOSIS — G8929 Other chronic pain: Secondary | ICD-10-CM | POA: Diagnosis not present

## 2022-08-26 DIAGNOSIS — M25561 Pain in right knee: Secondary | ICD-10-CM | POA: Diagnosis not present

## 2022-08-26 DIAGNOSIS — M6281 Muscle weakness (generalized): Secondary | ICD-10-CM

## 2022-08-26 DIAGNOSIS — M25661 Stiffness of right knee, not elsewhere classified: Secondary | ICD-10-CM | POA: Diagnosis not present

## 2022-08-26 DIAGNOSIS — R600 Localized edema: Secondary | ICD-10-CM

## 2022-08-26 NOTE — Therapy (Signed)
OUTPATIENT PHYSICAL THERAPY LOWER EXTREMITY TREATMENT   Patient Name: Jean Howell MRN: [id number] DOB:31-Dec-1955, 66 y.o., female Today's Date: 08/26/2022   PT End of Session - 08/26/22 1349     Visit Number 8    Number of Visits 12    Date for PT Re-Evaluation 10/26/22    Authorization Type FOTO AT LEAST EVERY 5TH VISIT.  PROGRESS NOTE AT 10TH VISIT.  KX MODIFIER AFTER 15 VISITS.    PT Start Time 1345    PT Stop Time 1430    PT Time Calculation (min) 45 min    Activity Tolerance Patient tolerated treatment well    Behavior During Therapy WFL for tasks assessed/performed               Past Medical History:  Diagnosis Date   Arthritis    Depression    Hypertension    Rheumatoid arthritis ([place])    Past Surgical History:  Procedure Laterality Date   BREAST EXCISIONAL BIOPSY Bilateral    pt unsure when- benign   BREAST SURGERY     nodules removed   COLONOSCOPY N/A 06/25/2019   Procedure: COLONOSCOPY;  Surgeon: Danie Binder, MD;  [facility];  Service: Endoscopy;  Laterality: N/A;  1:30   MOLE REMOVAL Right 01/26/2022   foot (patient was put to sleep for procedure)   POLYPECTOMY  06/25/2019   Procedure: POLYPECTOMY;  Surgeon: Danie Binder, MD;  [facility];  Service: Endoscopy;;   TOTAL KNEE ARTHROPLASTY Right 07/05/2022   Procedure: RIGHT TOTAL KNEE ARTHROPLASTY;  Surgeon: Leandrew Koyanagi, MD;  Location: [facility];  Service: Orthopedics;  Laterality: Right;   Patient Active Problem List   Diagnosis Date Noted   Status post total right knee replacement 07/05/2022   Primary osteoarthritis of right knee 06/10/2022   Osteopenia of neck of right femur 03/16/2022   Full code status 06/16/2020   Impaired mobility and endurance 06/16/2020   Sciatica of left side 06/16/2020   Rheumatoid arthritis involving multiple sites with positive rheumatoid factor ([place]) 03/06/2019   Primary osteoarthritis of both knees 03/06/2019   Former smoker 03/06/2019    ANA positive 02/12/2019   Family history of thyroid disease 01/08/2019   Morbid obesity ([place]) 01/08/2019   Essential hypertension 01/08/2019   Family history of colon cancer 01/08/2019    REFERRING PROVIDER: Frankey Shown MD  REFERRING DIAG: S/p right total knee replacement.  THERAPY DIAG:  Chronic pain of right knee  Stiffness of right knee, not elsewhere classified  Localized edema  Muscle weakness (generalized)  Rationale for Evaluation and Treatment Rehabilitation  ONSET DATE: 07/05/22 (surgery date).  SUBJECTIVE:   SUBJECTIVE STATEMENT: Pt reports that her right knee is feeling good, but her left knee is hurting from her arthritis.   PERTINENT HISTORY: OA, HTN, RA.  PAIN:  Are you having pain? Yes: NPRS scale: 2/10 Pain location: Right knee. Pain description: Soreness Aggravating factors: Walking. Relieving factors: Medication.  PRECAUTIONS: Other: Ultrasound.  WEIGHT BEARING RESTRICTIONS No  OBJECTIVE:   AROM Right 08/24/2022 Left   Hip flexion    Hip extension    Hip abduction    Hip adduction    Hip internal rotation    Hip external rotation    Knee flexion 125   Knee extension 2   Ankle dorsiflexion    Ankle plantarflexion    Ankle inversion    Ankle eversion     (Blank rows = not tested)  TODAY'S TREATMENT:  9/21 EXERCISE LOG  Exercise Repetitions and Resistance Comments  Recumbent bike  L3 x 15 minutes; seat 7   LAQ 5 lbs x 4 minutes   Rocker board 2 minutes Began feeling hot so rest required       Blank cell = exercise not performed today   Modalities  Date: 08/26/2022 Vaso: Knee, 34 degrees; low pressure, 15 mins, Pain and Edema  PATIENT EDUCATION:  Education details: Below. Person educated: Patient Education method: Explanation, Demonstration, and Handouts Education comprehension: verbalized understanding   HOME EXERCISE PROGRAM: [place] by Mali Applegate  Aug 23rd, 2023 View at [email or web address] using code: [email or web address] Page 1 of 1 1 Exercise KNEE EXTENSION STRETCH - PROPPED While seated, prop your foot up on another chair and allow gravity to stretch your knee towards a more straightened position. Repeat 1 Time Hold 10 Minutes Complete 1 Set Perform 3 Times a Day  ASSESSMENT:  CLINICAL IMPRESSION: Pt arrives for today's treatment session reporting 2/10 right knee pain.  Pt able to tolerate warm-up completely on recumbent bike today with seat set to 7.  Pt reports that her right knee is feeling much better and is having more issues with her non-surgical knee.  Normal responses to vaso noted upon removal.  Pt denied any pain at completion of today's treatment session.    OBJECTIVE IMPAIRMENTS Abnormal gait, decreased activity tolerance, decreased ROM, decreased strength, increased edema, and pain.   ACTIVITY LIMITATIONS locomotion level  PARTICIPATION LIMITATIONS: cleaning and yard work  [employer] POTENTIAL: Excellent  CLINICAL DECISION MAKING: Stable/uncomplicated  EVALUATION COMPLEXITY: Low   GOALS: Goals reviewed with patient? Yes  SHORT TERM GOALS: Target date: 08/26/2022  Ind with an initial HEP. Baseline: Goal status: NOT MET  2.  Full active right knee extension. Baseline:  Goal status: ON-GOING   LONG TERM GOALS: Target date: 09/23/2022   Ind with an advanced HEP. Baseline:  Goal status: ON-GOING  2.  Active right knee flexion to 115 degrees+ so the patient can perform functional tasks and do so with pain not > 2-3/10. Baseline:  Goal status: MET  3.  Increase right hip and knee strength to a solid 5/5 to provide good stability for accomplishment of functional activities. Baseline:  Goal status: ON-GOING  4.  Perform a reciprocating stair gait with one railing with pain not > 2-3/10. Baseline:  Goal status: ON-GOING  5.  Perform ADL's with pain not > 2-3/10. Baseline:  Goal status:  ON-GOING  PLAN: PT FREQUENCY: 2x/week  PT DURATION: 6 weeks  PLANNED INTERVENTIONS: Therapeutic exercises, Therapeutic activity, Neuromuscular re-education, Gait training, Patient/Family education, Self Care, Electrical stimulation, Cryotherapy, Moist heat, Vasopneumatic device, and Manual therapy  PLAN FOR NEXT SESSION: Nustep with progression to recumbent bike, PROM, PRE's.  Vasopneumatic.   Kathrynn Ducking, PTA 08/26/2022, 2:32 PM

## 2022-08-31 ENCOUNTER — Ambulatory Visit: Payer: Medicare Other | Admitting: *Deleted

## 2022-08-31 ENCOUNTER — Encounter: Payer: Self-pay | Admitting: *Deleted

## 2022-08-31 DIAGNOSIS — M6281 Muscle weakness (generalized): Secondary | ICD-10-CM

## 2022-08-31 DIAGNOSIS — M25661 Stiffness of right knee, not elsewhere classified: Secondary | ICD-10-CM | POA: Diagnosis not present

## 2022-08-31 DIAGNOSIS — G8929 Other chronic pain: Secondary | ICD-10-CM | POA: Diagnosis not present

## 2022-08-31 DIAGNOSIS — R600 Localized edema: Secondary | ICD-10-CM

## 2022-08-31 DIAGNOSIS — M25561 Pain in right knee: Secondary | ICD-10-CM | POA: Diagnosis not present

## 2022-08-31 NOTE — Therapy (Signed)
OUTPATIENT PHYSICAL THERAPY LOWER EXTREMITY TREATMENT   Patient Name: Jean Howell MRN: [id number] DOB:09/01/1956, 66 y.o., female Today's Date: 08/31/2022   PT End of Session - 08/31/22 1315     Visit Number 9    Number of Visits 12    Date for PT Re-Evaluation 10/26/22    Authorization Type FOTO AT LEAST EVERY 5TH VISIT.  PROGRESS NOTE AT 10TH VISIT.  KX MODIFIER AFTER 15 VISITS.    PT Start Time 1300    PT Stop Time 1350    PT Time Calculation (min) 50 min               Past Medical History:  Diagnosis Date   Arthritis    Depression    Hypertension    Rheumatoid arthritis ([place])    Past Surgical History:  Procedure Laterality Date   BREAST EXCISIONAL BIOPSY Bilateral    pt unsure when- benign   BREAST SURGERY     nodules removed   COLONOSCOPY N/A 06/25/2019   Procedure: COLONOSCOPY;  Surgeon: Danie Binder, MD;  [facility];  Service: Endoscopy;  Laterality: N/A;  1:30   MOLE REMOVAL Right 01/26/2022   foot (patient was put to sleep for procedure)   POLYPECTOMY  06/25/2019   Procedure: POLYPECTOMY;  Surgeon: Danie Binder, MD;  [facility];  Service: Endoscopy;;   TOTAL KNEE ARTHROPLASTY Right 07/05/2022   Procedure: RIGHT TOTAL KNEE ARTHROPLASTY;  Surgeon: Leandrew Koyanagi, MD;  Location: [place];  Service: Orthopedics;  Laterality: Right;   Patient Active Problem List   Diagnosis Date Noted   Status post total right knee replacement 07/05/2022   Primary osteoarthritis of right knee 06/10/2022   Osteopenia of neck of right femur 03/16/2022   Full code status 06/16/2020   Impaired mobility and endurance 06/16/2020   Sciatica of left side 06/16/2020   Rheumatoid arthritis involving multiple sites with positive rheumatoid factor ([place]) 03/06/2019   Primary osteoarthritis of both knees 03/06/2019   Former smoker 03/06/2019   ANA positive 02/12/2019   Family history of thyroid disease 01/08/2019   Morbid obesity ([place]) 01/08/2019    Essential hypertension 01/08/2019   Family history of colon cancer 01/08/2019    REFERRING PROVIDER: Frankey Shown MD  REFERRING DIAG: S/p right total knee replacement.  THERAPY DIAG:  Chronic pain of right knee  Stiffness of right knee, not elsewhere classified  Localized edema  Muscle weakness (generalized)  Rationale for Evaluation and Treatment Rehabilitation  ONSET DATE: 07/05/22 (surgery date).  SUBJECTIVE:   SUBJECTIVE STATEMENT: Pt reports that her right knee is feeling good. To MD 10-24.   PERTINENT HISTORY: OA, HTN, RA.  PAIN:  Are you having pain? Yes: NPRS scale: 2-3/10 Pain location: Right knee. Pain description: Soreness Aggravating factors: Walking. Relieving factors: Medication.  PRECAUTIONS: Other: Ultrasound.  WEIGHT BEARING RESTRICTIONS No  OBJECTIVE:   AROM Right 08/24/2022 Left   Hip flexion    Hip extension    Hip abduction    Hip adduction    Hip internal rotation    Hip external rotation    Knee flexion 125   Knee extension 2   Ankle dorsiflexion    Ankle plantarflexion    Ankle inversion    Ankle eversion     (Blank rows = not tested)  TODAY'S TREATMENT:  9/26 EXERCISE LOG  Exercise Repetitions and Resistance Comments  Recumbent bike  L3-5 x 15 minutes; seat 6   Knee ext machine 10#s  2xfatigue   Knee flexion 30# 3 x fatigue   Marching  x20   Sit to stand X8 focus on glute activation   LAQ    Rocker board  Began feeling hot so rest required       Blank cell = exercise not performed today   Modalities  Date: 08/26/2022 Vaso: Knee, 34 degrees; low pressure, 15 mins, Pain and Edema  PATIENT EDUCATION:  Education details: Below. Person educated: Patient Education method: Explanation, Demonstration, and Handouts Education comprehension: verbalized understanding   HOME EXERCISE PROGRAM: [place] by Mali Applegate Aug 23rd, 2023 View at  [email or web address] using code: [email or web address] Page 1 of 1 1 Exercise KNEE EXTENSION STRETCH - PROPPED While seated, prop your foot up on another chair and allow gravity to stretch your knee towards a more straightened position. Repeat 1 Time Hold 10 Minutes Complete 1 Set Perform 3 Times a Day  ASSESSMENT:  CLINICAL IMPRESSION: Pt arrives for today's treatment session reporting 2-3/10 right knee pain.  Pt able to tolerate bike again today and do very well with increased resistance and moved to seat 6.Pt also able to progress to knee machine and did well. Vaso end of session for edema control.  OBJECTIVE IMPAIRMENTS Abnormal gait, decreased activity tolerance, decreased ROM, decreased strength, increased edema, and pain.   ACTIVITY LIMITATIONS locomotion level  PARTICIPATION LIMITATIONS: cleaning and yard work  [employer] POTENTIAL: Excellent  CLINICAL DECISION MAKING: Stable/uncomplicated  EVALUATION COMPLEXITY: Low   GOALS: Goals reviewed with patient? Yes  SHORT TERM GOALS: Target date: 08/26/2022  Ind with an initial HEP. Baseline: Goal status: NOT MET  2.  Full active right knee extension. Baseline:  Goal status: ON-GOING   LONG TERM GOALS: Target date: 09/23/2022   Ind with an advanced HEP. Baseline:  Goal status: ON-GOING  2.  Active right knee flexion to 115 degrees+ so the patient can perform functional tasks and do so with pain not > 2-3/10. Baseline:  Goal status: MET  3.  Increase right hip and knee strength to a solid 5/5 to provide good stability for accomplishment of functional activities. Baseline:  Goal status: ON-GOING  4.  Perform a reciprocating stair gait with one railing with pain not > 2-3/10. Baseline:  Goal status: ON-GOING  5.  Perform ADL's with pain not > 2-3/10. Baseline:  Goal status: ON-GOING  PLAN: PT FREQUENCY: 2x/week  PT DURATION: 6 weeks  PLANNED INTERVENTIONS: Therapeutic exercises, Therapeutic activity, Neuromuscular  re-education, Gait training, Patient/Family education, Self Care, Electrical stimulation, Cryotherapy, Moist heat, Vasopneumatic device, and Manual therapy  PLAN FOR NEXT SESSION: Nustep with progression to recumbent bike, PROM, PRE's.  Vasopneumatic.   Jahmia Berrett,CHRIS, PTA 08/31/2022, 2:04 PM

## 2022-09-02 ENCOUNTER — Encounter: Payer: Self-pay | Admitting: *Deleted

## 2022-09-02 ENCOUNTER — Ambulatory Visit: Payer: Medicare Other | Admitting: *Deleted

## 2022-09-02 DIAGNOSIS — R600 Localized edema: Secondary | ICD-10-CM

## 2022-09-02 DIAGNOSIS — M25561 Pain in right knee: Secondary | ICD-10-CM | POA: Diagnosis not present

## 2022-09-02 DIAGNOSIS — M6281 Muscle weakness (generalized): Secondary | ICD-10-CM

## 2022-09-02 DIAGNOSIS — G8929 Other chronic pain: Secondary | ICD-10-CM

## 2022-09-02 DIAGNOSIS — M25661 Stiffness of right knee, not elsewhere classified: Secondary | ICD-10-CM | POA: Diagnosis not present

## 2022-09-02 NOTE — Therapy (Signed)
OUTPATIENT PHYSICAL THERAPY LOWER EXTREMITY TREATMENT   Patient Name: Jean Howell MRN: [id number] DOB:[birth year], 66 y.o., female Today's Date: 09/02/2022   PT End of Session - 09/02/22 1351     Visit Number 10    Number of Visits 12    Date for PT Re-Evaluation 10/26/22    Authorization Type FOTO AT LEAST EVERY 5TH VISIT.  PROGRESS NOTE AT 10TH VISIT.  KX MODIFIER AFTER 15 VISITS.    PT Start Time 1345    PT Stop Time 1443    PT Time Calculation (min) 58 min               Past Medical History:  Diagnosis Date   Arthritis    Depression    Hypertension    Rheumatoid arthritis ([place])    Past Surgical History:  Procedure Laterality Date   BREAST EXCISIONAL BIOPSY Bilateral    pt unsure when- benign   BREAST SURGERY     nodules removed   COLONOSCOPY N/A 06/25/2019   Procedure: COLONOSCOPY;  Surgeon: Danie Binder, MD;  [facility];  Service: Endoscopy;  Laterality: N/A;  1:30   MOLE REMOVAL Right 01/26/2022   foot (patient was put to sleep for procedure)   POLYPECTOMY  06/25/2019   Procedure: POLYPECTOMY;  Surgeon: Danie Binder, MD;  [facility];  Service: Endoscopy;;   TOTAL KNEE ARTHROPLASTY Right 07/05/2022   Procedure: RIGHT TOTAL KNEE ARTHROPLASTY;  Surgeon: Leandrew Koyanagi, MD;  Location: [place];  Service: Orthopedics;  Laterality: Right;   Patient Active Problem List   Diagnosis Date Noted   Status post total right knee replacement 07/05/2022   Primary osteoarthritis of right knee 06/10/2022   Osteopenia of neck of right femur 03/16/2022   Full code status 06/16/2020   Impaired mobility and endurance 06/16/2020   Sciatica of left side 06/16/2020   Rheumatoid arthritis involving multiple sites with positive rheumatoid factor ([place]) 03/06/2019   Primary osteoarthritis of both knees 03/06/2019   Former smoker 03/06/2019   ANA positive 02/12/2019   Family history of thyroid disease 01/08/2019   Morbid obesity ([place]) 01/08/2019    Essential hypertension 01/08/2019   Family history of colon cancer 01/08/2019    REFERRING PROVIDER: Frankey Shown MD  REFERRING DIAG: S/p right total knee replacement.  THERAPY DIAG:  Chronic pain of right knee  Stiffness of right knee, not elsewhere classified  Localized edema  Muscle weakness (generalized)  Rationale for Evaluation and Treatment Rehabilitation  ONSET DATE: 07/05/22 (surgery date).  SUBJECTIVE:   SUBJECTIVE STATEMENT: Pt reports that her right knee is feeling good. To MD 10-24. Get more visits to keep doing PT until MD visit  PERTINENT HISTORY: OA, HTN, RA. LBP  PAIN:  Are you having pain? Yes: NPRS scale: 2-3/10 Pain location: Right knee. Pain description: Soreness Aggravating factors: Walking. Relieving factors: Medication.  PRECAUTIONS: Other: Ultrasound.  WEIGHT BEARING RESTRICTIONS No  OBJECTIVE:   10th visit FOTO taken  AROM Right 08/24/2022 Left   Hip flexion    Hip extension    Hip abduction    Hip adduction    Hip internal rotation    Hip external rotation    Knee flexion 125   Knee extension 2   Ankle dorsiflexion    Ankle plantarflexion    Ankle inversion    Ankle eversion     (Blank rows = not tested)  TODAY'S TREATMENT:  9/28 EXERCISE LOG  Exercise Repetitions and Resistance Comments  Recumbent bike  L3-5 x 15 minutes; seat 6   Knee ext machine 10#s  3xfatigue   Knee flexion 30# 3 x fatigue   Marching     Sit to stand    [employer] board  Began feeling hot so rest required  Up/down steps With rail up with alternating gait/ down 1 at a time with cues   Up/down step 4in step  X 10 with UE assist    Blank cell = exercise not performed today   Modalities  Date: 08/26/2022 Vaso: Knee, 34 degrees; low pressure, 15 mins, Pain and Edema  PATIENT EDUCATION:  Education details: Below. Person educated: Patient Education method: Explanation, Demonstration, and Handouts Education  comprehension: verbalized understanding   HOME EXERCISE PROGRAM: [place] by Mali Applegate Aug 23rd, 2023 View at [email or web address] using code: [email or web address] Page 1 of 1 1 Exercise KNEE EXTENSION STRETCH - PROPPED While seated, prop your foot up on another chair and allow gravity to stretch your knee towards a more straightened position. Repeat 1 Time Hold 10 Minutes Complete 1 Set Perform 3 Times a Day  ASSESSMENT:  CLINICAL IMPRESSION: Pt arrives for today's treatment session reporting 2-3/10 right knee pain. Rx focused on ROM and LE strengthening as well as negotiating steps. She is now able to go up steps with alternating pattern, but not down due to weakness and fear of  falling.Pt continues to progress towards LTG's, but doesn't f/u with MD until 09-28-22 so may need a recert.    OBJECTIVE IMPAIRMENTS Abnormal gait, decreased activity tolerance, decreased ROM, decreased strength, increased edema, and pain.   ACTIVITY LIMITATIONS locomotion level  PARTICIPATION LIMITATIONS: cleaning and yard work  [employer] POTENTIAL: Excellent  CLINICAL DECISION MAKING: Stable/uncomplicated  EVALUATION COMPLEXITY: Low   GOALS: Goals reviewed with patient? Yes  SHORT TERM GOALS: Target date: 08/26/2022  Ind with an initial HEP. Baseline: Goal status: MET  2.  Full active right knee extension. Baseline:  Goal status: ON-GOING   LONG TERM GOALS: Target date: 09/23/2022   Ind with an advanced HEP. Baseline:  Goal status: ON-GOING  2.  Active right knee flexion to 115 degrees+ so the patient can perform functional tasks and do so with pain not > 2-3/10. Baseline:  Goal status: MET  3.  Increase right hip and knee strength to a solid 5/5 to provide good stability for accomplishment of functional activities. Baseline:  Goal status: ON-GOING  4.  Perform a reciprocating stair gait with one railing with pain not > 2-3/10. Baseline:  Goal status:  Partially Met.    up but not down  5.  Perform ADL's with pain not > 2-3/10. Baseline:  Goal status: ON-GOING  PLAN: PT FREQUENCY: 2x/week  PT DURATION: 6 weeks  PLANNED INTERVENTIONS: Therapeutic exercises, Therapeutic activity, Neuromuscular re-education, Gait training, Patient/Family education, Self Care, Electrical stimulation, Cryotherapy, Moist heat, Vasopneumatic device, and Manual therapy  PLAN FOR NEXT SESSION: Nustep with progression to recumbent bike, PROM, PRE's.  Vasopneumatic.   Trenyce Loera,CHRIS, PTA 09/02/2022, 5:13 PM   Progress Note Reporting Period 07/28/22 to 09/02/22  See note below for Objective Data and Assessment of Progress/Goals. Patient progressing toward goals.  Her right knee range of motion has improved significantly.    Mali Applegate MPT

## 2022-09-06 ENCOUNTER — Other Ambulatory Visit (HOSPITAL_COMMUNITY): Payer: Self-pay

## 2022-09-07 ENCOUNTER — Other Ambulatory Visit: Payer: Medicare Other

## 2022-09-07 ENCOUNTER — Ambulatory Visit: Payer: Medicare Other | Attending: Orthopaedic Surgery | Admitting: Physical Therapy

## 2022-09-07 ENCOUNTER — Encounter: Payer: Self-pay | Admitting: Family Medicine

## 2022-09-07 ENCOUNTER — Ambulatory Visit (INDEPENDENT_AMBULATORY_CARE_PROVIDER_SITE_OTHER): Payer: Medicare Other | Admitting: Family Medicine

## 2022-09-07 ENCOUNTER — Other Ambulatory Visit (HOSPITAL_COMMUNITY): Payer: Self-pay

## 2022-09-07 ENCOUNTER — Encounter: Payer: Self-pay | Admitting: Physical Therapy

## 2022-09-07 VITALS — BP 133/84 | HR 100 | Temp 97.6°F | Ht 64.0 in | Wt 211.0 lb

## 2022-09-07 DIAGNOSIS — M6281 Muscle weakness (generalized): Secondary | ICD-10-CM | POA: Diagnosis not present

## 2022-09-07 DIAGNOSIS — Z23 Encounter for immunization: Secondary | ICD-10-CM

## 2022-09-07 DIAGNOSIS — F5101 Primary insomnia: Secondary | ICD-10-CM

## 2022-09-07 DIAGNOSIS — M5432 Sciatica, left side: Secondary | ICD-10-CM

## 2022-09-07 DIAGNOSIS — Z79899 Other long term (current) drug therapy: Secondary | ICD-10-CM

## 2022-09-07 DIAGNOSIS — M792 Neuralgia and neuritis, unspecified: Secondary | ICD-10-CM

## 2022-09-07 DIAGNOSIS — R600 Localized edema: Secondary | ICD-10-CM | POA: Insufficient documentation

## 2022-09-07 DIAGNOSIS — M25661 Stiffness of right knee, not elsewhere classified: Secondary | ICD-10-CM | POA: Diagnosis not present

## 2022-09-07 DIAGNOSIS — R55 Syncope and collapse: Secondary | ICD-10-CM

## 2022-09-07 DIAGNOSIS — M0579 Rheumatoid arthritis with rheumatoid factor of multiple sites without organ or systems involvement: Secondary | ICD-10-CM | POA: Diagnosis not present

## 2022-09-07 DIAGNOSIS — I10 Essential (primary) hypertension: Secondary | ICD-10-CM | POA: Diagnosis not present

## 2022-09-07 DIAGNOSIS — M25561 Pain in right knee: Secondary | ICD-10-CM | POA: Diagnosis not present

## 2022-09-07 DIAGNOSIS — G8929 Other chronic pain: Secondary | ICD-10-CM | POA: Insufficient documentation

## 2022-09-07 DIAGNOSIS — D84821 Immunodeficiency due to drugs: Secondary | ICD-10-CM

## 2022-09-07 MED ORDER — LOSARTAN POTASSIUM-HCTZ 100-12.5 MG PO TABS: ORAL_TABLET | ORAL | 3 refills | Status: DC

## 2022-09-07 MED ORDER — AMITRIPTYLINE HCL 10 MG PO TABS: ORAL_TABLET | ORAL | 0 refills | Status: DC

## 2022-09-07 MED ORDER — BELSOMRA 10 MG PO TABS: 10.0000 mg | ORAL_TABLET | Freq: Every evening | ORAL | 3 refills | Status: DC | PRN

## 2022-09-07 MED ORDER — GABAPENTIN 400 MG PO CAPS: 400.0000 mg | ORAL_CAPSULE | Freq: Every day | ORAL | 3 refills | Status: DC

## 2022-09-07 NOTE — Patient Instructions (Addendum)
Sending you home with a heart monitor to see if your heart is skipping beats.   If we find abnormal beats, I will place a referral to the heart doctor. Amitriptyline is a medication that can cause abnormal heart rhythm, so we are going to wean you off.  I have sent in '10mg'$  to replace the '25mg'$  you have been taking.  Take 1 daily x1 month.  Then 1/2 tablet daily x1 month.  Then 1/2 tablet every OTHER day x2 weeks. Then stop. Ok to cut back on the blood pressure pill if your blood pressure remains less than 150/90.  Remember to add 10 points to the top and bottom numbers when you check your's at home since there was a 10 point difference here.  That should give you an accurate blood pressure reading.  Our records indicate that you are due for your annual mammogram/breast imaging. While there is no way to prevent breast cancer, early detection provides the best opportunity for curing it. For women over the age of 16, the [place] recommends a yearly clinical breast exam and a yearly mammogram. These practices have saved thousands of lives. We need your help to ensure that you are receiving optimal medical care. Please call the imaging location that has done you previous mammograms. Please remember to list [place] as your primary care. This helps make sure we receive a report and can update your chart.  Below is the contact information for several local breast imaging centers. You may call the location that works best for you, and they will be happy to assistance in making you an appointment. You do not need an order for a regular screening mammogram. However, if you are having any problems or concerns with you breast area, please let your primary care provider know, and appropriate orders will be placed. Please let our office know if you have any questions or concerns. Or if you need information for another imaging center not on this list or outside of the area. We are commented to working with you on  your health care journey.   The mobile unit/bus ([facility]) - they come twice a month to our location.  These appointments can be made through our office or by call [facility] [facility]  [place], [place] [address] Phone [phone]  [facility] Radiology Department  [address] [place], [place] [address] [phone]  [facility] [place])  [phone] [address] [place], [place] [address] [phone]  [place] [address] [place] [address] [place] [address] [phone]  [place]  [address] [place]  [place] [address] [phone]  [facility] Mammography in [place] [address] [place], [place] [address] [phone]  [facility] [place], [place] [address] [phone]  [facility] at [facility]  [place],  [address] [phone]  [place] [address] [phone]

## 2022-09-07 NOTE — Therapy (Signed)
OUTPATIENT PHYSICAL THERAPY LOWER EXTREMITY TREATMENT   Patient Name: Jean Howell MRN: [id number] DOB:04-18-1956, 66 y.o., female Today's Date: 09/07/2022   PT End of Session - 09/07/22 1331     Visit Number 12    Number of Visits 18    Date for PT Re-Evaluation 10/26/22    Authorization Type FOTO AT LEAST EVERY 5TH VISIT.  PROGRESS NOTE AT 10TH VISIT.  KX MODIFIER AFTER 15 VISITS.                Past Medical History:  Diagnosis Date   Arthritis    Depression    Hypertension    Rheumatoid arthritis ([place])    Past Surgical History:  Procedure Laterality Date   BREAST EXCISIONAL BIOPSY Bilateral    pt unsure when- benign   BREAST SURGERY     nodules removed   COLONOSCOPY N/A 06/25/2019   Procedure: COLONOSCOPY;  Surgeon: Danie Binder, MD;  [facility];  Service: Endoscopy;  Laterality: N/A;  1:30   MOLE REMOVAL Right 01/26/2022   foot (patient was put to sleep for procedure)   POLYPECTOMY  06/25/2019   Procedure: POLYPECTOMY;  Surgeon: Danie Binder, MD;  [facility];  Service: Endoscopy;;   TOTAL KNEE ARTHROPLASTY Right 07/05/2022   Procedure: RIGHT TOTAL KNEE ARTHROPLASTY;  Surgeon: Leandrew Koyanagi, MD;  Location: [place];  Service: Orthopedics;  Laterality: Right;   Patient Active Problem List   Diagnosis Date Noted   Status post total right knee replacement 07/05/2022   Primary osteoarthritis of right knee 06/10/2022   Osteopenia of neck of right femur 03/16/2022   Full code status 06/16/2020   Impaired mobility and endurance 06/16/2020   Sciatica of left side 06/16/2020   Rheumatoid arthritis involving multiple sites with positive rheumatoid factor ([place]) 03/06/2019   Primary osteoarthritis of both knees 03/06/2019   Former smoker 03/06/2019   ANA positive 02/12/2019   Family history of thyroid disease 01/08/2019   Morbid obesity ([place]) 01/08/2019   Essential hypertension 01/08/2019   Family history of colon cancer 01/08/2019     REFERRING PROVIDER: Frankey Shown MD  REFERRING DIAG: S/p right total knee replacement.  THERAPY DIAG:  Chronic pain of right knee  Stiffness of right knee, not elsewhere classified  Localized edema  Muscle weakness (generalized)  Rationale for Evaluation and Treatment Rehabilitation  ONSET DATE: 07/05/22 (surgery date).  SUBJECTIVE:   SUBJECTIVE STATEMENT: Pian about a 3/10.  PERTINENT HISTORY: OA, HTN, RA. LBP  PAIN:  Are you having pain? Yes: NPRS scale: 2-3/10 Pain location: Right knee. Pain description: Soreness Aggravating factors: Walking. Relieving factors: Medication.  PRECAUTIONS: Other: Ultrasound.  WEIGHT BEARING RESTRICTIONS No  OBJECTIVE:   TODAY'S TREATMENT:                                        9/28 EXERCISE LOG  Exercise Repetitions and Resistance Comments  Recumbent bike  L3 x 15 minutes progressing to seat 6   Knee ext machine 10# x 3 minutes.   Knee flexion 30# 3 minutes.   Leg press 2 plates 3 minutes.                         Modalities  Date: 09/07/2022 Vasopneumatic on medium x 20 minutes.    ASSESSMENT:  CLINICAL IMPRESSION: Patient did an excellent job today and  progressed to seat 5 and added leg press at seat 2.  Patient tolerated treatment well.   OBJECTIVE IMPAIRMENTS Abnormal gait, decreased activity tolerance, decreased ROM, decreased strength, increased edema, and pain.   ACTIVITY LIMITATIONS locomotion level  PARTICIPATION LIMITATIONS: cleaning and yard work  [employer] POTENTIAL: Excellent  CLINICAL DECISION MAKING: Stable/uncomplicated  EVALUATION COMPLEXITY: Low   GOALS: Goals reviewed with patient? Yes  SHORT TERM GOALS: Target date: 08/26/2022  Ind with an initial HEP. Baseline: Goal status: MET  2.  Full active right knee extension. Baseline:  Goal status: ON-GOING   LONG TERM GOALS: Target date: 09/23/2022   Ind with an advanced HEP. Baseline:  Goal status: ON-GOING  2.  Active right  knee flexion to 115 degrees+ so the patient can perform functional tasks and do so with pain not > 2-3/10. Baseline:  Goal status: MET  3.  Increase right hip and knee strength to a solid 5/5 to provide good stability for accomplishment of functional activities. Baseline:  Goal status: ON-GOING  4.  Perform a reciprocating stair gait with one railing with pain not > 2-3/10. Baseline:  Goal status: Partially Met.    up but not down  5.  Perform ADL's with pain not > 2-3/10. Baseline:  Goal status: ON-GOING  PLAN: PT FREQUENCY: 2x/week  PT DURATION: 6 weeks  PLANNED INTERVENTIONS: Therapeutic exercises, Therapeutic activity, Neuromuscular re-education, Gait training, Patient/Family education, Self Care, Electrical stimulation, Cryotherapy, Moist heat, Vasopneumatic device, and Manual therapy  PLAN FOR NEXT SESSION: Nustep with progression to recumbent bike, PROM, PRE's.  Vasopneumatic.   Zia Najera, [place], PT 09/07/2022, 1:48 PM   R

## 2022-09-07 NOTE — Progress Notes (Signed)
Subjective: CC: 62-monthfollow-up PCP: GJanora Norlander DO HENI:DPOEUMPNPOgburnis a 66y.o. female presenting to clinic today for:  1.  Hypertension Patient reports that she has been intermittently taking her antihypertensive.  Her blood pressure was 127/73 this morning so she did not take her medication.  She notes that if the blood pressure goes above 140 or above 90 she goes ahead and takes the medicine.  She reports that she was having some levels in her blood pressure after addition of the Robaxin.  This was added for her spasm related to knee surgery.  She is really been trying to avoid that medication since she lost consciousness recently.  She notes that she has seen her blood pressure meter showed abnormal heartbeat but did not think anything of that.  She denies sensation of palpitation nor any recurrent syncope since discontinuing the methocarbamol.  2.  Insomnia Patient reports she really does not take the [place] probably would work if she did.  She still has half a bottle at home.  She continues to have mixed up of her day and nights but does sleep just not at the right times.   ROS: Per HPI  No Known Allergies Past Medical History:  Diagnosis Date   Arthritis    Depression    Hypertension    [place]heumatoid arthritis ([place]     Current Outpatient Medications:    amitriptyline (ELAVIL) 25 MG tablet, Take 1 tablet (25 mg total) by mouth at bedtime., Disp: 90 tablet, Rfl: 2   Calcium-Magnesium-Zinc (CAL-MAG-ZINC PO), Take 1 tablet by mouth daily., Disp: , Rfl:    docusate sodium (COLACE) 100 MG capsule, Take 1 capsule (100 mg total) by mouth daily as needed., Disp: 30 capsule, Rfl: 2   Etanercept (ENBREL MINI) 50 MG/ML SOCT, Inject 50 mg into the skin once a week., Disp: 4 mL, Rfl: 2   fluticasone (FLONASE) 50 MCG/ACT nasal spray, Place 2 sprays into both nostrils daily. (Patient taking differently: Place 2 sprays into both nostrils daily as needed (Ears stopped  up).), Disp: 16 g, Rfl: 6   gabapentin (NEURONTIN) 400 MG capsule, TAKE ONE CAPSULE BY MOUTH AT BEDTIME, Disp: 90 capsule, Rfl: 0   leflunomide (ARAVA) 20 MG tablet, TAKE ONE (1) TABLET EACH DAY, Disp: 90 tablet, Rfl: 0   losartan-hydrochlorothiazide (HYZAAR) 100-12.5 MG tablet, TAKE ONE (1) TABLET EACH DAY, Disp: 90 tablet, Rfl: 2   methocarbamol (ROBAXIN-750) 750 MG tablet, Take 1 tablet (750 mg total) by mouth 2 (two) times daily as needed for muscle spasms., Disp: 20 tablet, Rfl: 2   Multiple Vitamin (MULTIVITAMIN PO), Take 1 tablet by mouth daily., Disp: , Rfl:    ondansetron (ZOFRAN) 4 MG tablet, Take 1 tablet (4 mg total) by mouth every 8 (eight) hours as needed for nausea or vomiting., Disp: 40 tablet, Rfl: 0   rosuvastatin (CRESTOR) 10 MG tablet, Take 1 tablet (10 mg total) by mouth daily., Disp: 90 tablet, Rfl: 3   Suvorexant (BELSOMRA) 10 MG TABS, Take 10 mg by mouth at bedtime as needed (sleep)., Disp: 30 tablet, Rfl: 3 Social History   Socioeconomic History   Marital status: Widowed    Spouse name: Not on file   Number of children: 1   Years of education: Not on file   Highest education level: GED or equivalent  Occupational History   Occupation: retired  Tobacco Use   Smoking status: Former    Packs/day: 1.00    Years: 15.00  Total pack years: 15.00    Types: Cigarettes    Quit date: 2000    Years since quitting: 23.7    Passive exposure: Never   Smokeless tobacco: Never  Vaping Use   Vaping Use: Never used  Substance and Sexual Activity   Alcohol use: Not Currently   Drug use: Never   Sexual activity: Not Currently    Comment: widowed  Other Topics Concern   Not on file  Social History Narrative   Patient is a widow that resides independently in [place].  She has 1 son, who resides in [place].   Social Determinants of Health   Financial Resource Strain: Low Risk  (06/18/2022)   Overall Financial Resource Strain (CARDIA)    Difficulty of Paying  Living Expenses: Not hard at all  Food Insecurity: No Food Insecurity (06/18/2022)   Hunger Vital Sign    Worried About Running Out of Food in the Last Year: Never true    Ran Out of Food in the Last Year: Never true  Transportation Needs: No Transportation Needs (06/18/2022)   PRAPARE - Hydrologist (Medical): No    Lack of Transportation (Non-Medical): No  Physical Activity: Inactive (06/18/2022)   Exercise Vital Sign    Days of Exercise per Week: 0 days    Minutes of Exercise per Session: 0 min  Stress: No Stress Concern Present (06/18/2022)   [place]    Feeling of Stress : Not at all  Social Connections: Moderately Integrated (06/18/2022)   Social Connection and Isolation Panel [NHANES]    Frequency of Communication with Friends and Family: More than three times a week    Frequency of Social Gatherings with Friends and Family: Twice a week    Attends Religious Services: More than 4 times per year    Active Member of [employer] or Organizations: Yes    Attends Archivist Meetings: More than 4 times per year    Marital Status: Never married  Intimate Partner Violence: Not At Risk (06/18/2022)   Humiliation, Afraid, Rape, and Kick questionnaire    Fear of Current or Ex-Partner: No    Emotionally Abused: No    Physically Abused: No    Sexually Abused: No   Family History  Problem Relation Age of Onset   Cancer Father    Colon cancer Father    Thyroid disease Mother    CVA Mother 47   Thyroid disease Sister    Healthy Son    Congestive Heart Failure Sister    Hypertension Sister    Diabetes Sister    Hypertension Sister    Diabetes Sister    Hypertension Sister    Hypertension Sister    Cancer Brother        kidney     Objective: Office vital signs reviewed. BP (!) 137/90   Pulse 100   Temp 97.6 F (36.4 C)   Ht '5\' 4"'$  (1.626 m)   Wt 211 lb (95.7 kg)   SpO2 97%    BMI 36.22 kg/m   Physical Examination:  General: Awake, alert, nontoxic female, No acute distress HEENT: Sclera white.  Moist mucous membranes Cardio: regular rate and rhythm, S1S2 heard, no murmurs appreciated Pulm: clear to auscultation bilaterally, no wheezes, rhonchi or rales; normal work of breathing on  Assessment/ Plan: 66 y.o. female   Essential hypertension - Plan: losartan-hydrochlorothiazide (HYZAAR) 100-12.5 MG tablet  Need for immunization against  influenza - Plan: Flu Vaccine QUAD High Dose(Fluad)  Immunocompromised state due to drug therapy (HCC)  Rheumatoid arthritis involving multiple sites with positive rheumatoid factor (HCC) - Plan: amitriptyline (ELAVIL) 10 MG tablet  Primary insomnia - Plan: Suvorexant (BELSOMRA) 10 MG TABS  Sciatica of left side - Plan: gabapentin (NEURONTIN) 400 MG capsule, amitriptyline (ELAVIL) 10 MG tablet  Neuralgia - Plan: gabapentin (NEURONTIN) 400 MG capsule, amitriptyline (ELAVIL) 10 MG tablet  Syncope, unspecified syncope type - Plan: LONG TERM MONITOR (3-14 DAYS)  Blood pressure is controlled.  We discussed trialing half a tablet of the Hyzaar daily and monitoring blood pressures closely.  I am going to start weaning her from the amitriptyline given concerns for arrhythmia.  A Zio patch was also placed today given reports of arrhythmia and syncopal episode.  I did not appreciate any abnormal heart rhythm on exam.  Her blood pressure meter is about 10 points off of ours so I advised her to add 10 points to the top and bottom to determine what her blood pressure is actually running  For her insomnia, Belsomra renewed and placed on file though she does not sound that she is really using it  The gabapentin was renewed..  I will reassess her in the next 6 to 8 weeks, sooner if concerns arise.  Plan for referral to cardiology if abnormal heart rhythm identified or if any recurrent syncopal episodes  The Narcotic Database has been  reviewed.  There were no red flags.    Orders Placed This Encounter  Procedures   Flu Vaccine QUAD High Dose(Fluad)   No orders of the defined types were placed in this encounter.    [name], DO [place] [phone]

## 2022-09-09 ENCOUNTER — Ambulatory Visit: Payer: Medicare Other | Admitting: *Deleted

## 2022-09-09 ENCOUNTER — Other Ambulatory Visit: Payer: Self-pay | Admitting: Family Medicine

## 2022-09-09 DIAGNOSIS — R600 Localized edema: Secondary | ICD-10-CM

## 2022-09-09 DIAGNOSIS — M0579 Rheumatoid arthritis with rheumatoid factor of multiple sites without organ or systems involvement: Secondary | ICD-10-CM

## 2022-09-09 DIAGNOSIS — M6281 Muscle weakness (generalized): Secondary | ICD-10-CM | POA: Diagnosis not present

## 2022-09-09 DIAGNOSIS — G8929 Other chronic pain: Secondary | ICD-10-CM | POA: Diagnosis not present

## 2022-09-09 DIAGNOSIS — M25661 Stiffness of right knee, not elsewhere classified: Secondary | ICD-10-CM

## 2022-09-09 DIAGNOSIS — M25561 Pain in right knee: Secondary | ICD-10-CM | POA: Diagnosis not present

## 2022-09-09 NOTE — Therapy (Signed)
OUTPATIENT PHYSICAL THERAPY LOWER EXTREMITY TREATMENT   Patient Name: Ondine Gemme MRN: [id number] DOB:1956-03-06, 66 y.o., female Today's Date: 09/09/2022   PT End of Session - 09/09/22 1314     Visit Number 13    Number of Visits 18    Authorization Type FOTO AT [place].  PROGRESS NOTE AT 10TH VISIT.  KX MODIFIER AFTER 15 VISITS.    PT Start Time 1300    PT Stop Time 1356    PT Time Calculation (min) 56 min                Past Medical History:  Diagnosis Date   Arthritis    Depression    Hypertension    Rheumatoid arthritis ([place])    Past Surgical History:  Procedure Laterality Date   BREAST EXCISIONAL BIOPSY Bilateral    pt unsure when- benign   BREAST SURGERY     nodules removed   COLONOSCOPY N/A 06/25/2019   Procedure: COLONOSCOPY;  Surgeon: Danie Binder, MD;  [facility];  Service: Endoscopy;  Laterality: N/A;  1:30   MOLE REMOVAL Right 01/26/2022   foot (patient was put to sleep for procedure)   POLYPECTOMY  06/25/2019   Procedure: POLYPECTOMY;  Surgeon: Danie Binder, MD;  [facility];  Service: Endoscopy;;   TOTAL KNEE ARTHROPLASTY Right 07/05/2022   Procedure: RIGHT TOTAL KNEE ARTHROPLASTY;  Surgeon: Leandrew Koyanagi, MD;  Location: [facility];  Service: Orthopedics;  Laterality: Right;   Patient Active Problem List   Diagnosis Date Noted   Status post total right knee replacement 07/05/2022   Primary osteoarthritis of right knee 06/10/2022   Osteopenia of neck of right femur 03/16/2022   Full code status 06/16/2020   Impaired mobility and endurance 06/16/2020   Sciatica of left side 06/16/2020   Rheumatoid arthritis involving multiple sites with positive rheumatoid factor ([place]) 03/06/2019   Primary osteoarthritis of both knees 03/06/2019   Former smoker 03/06/2019   ANA positive 02/12/2019   Family history of thyroid disease 01/08/2019   Morbid obesity ([place]) 01/08/2019   Essential hypertension 01/08/2019    Family history of colon cancer 01/08/2019    REFERRING PROVIDER: Frankey Shown MD  REFERRING DIAG: S/p right total knee replacement.  THERAPY DIAG:  Chronic pain of right knee  Stiffness of right knee, not elsewhere classified  Localized edema  Muscle weakness (generalized)  Rationale for Evaluation and Treatment Rehabilitation  ONSET DATE: 07/05/22 (surgery date).  SUBJECTIVE:   SUBJECTIVE STATEMENT: Pian about a 3/10.  PERTINENT HISTORY: OA, HTN, RA. LBP  PAIN:  Are you having pain? Yes: NPRS scale: 2-3/10 Pain location: Right knee. Pain description: Soreness Aggravating factors: Walking. Relieving factors: Medication.  PRECAUTIONS: Other: Ultrasound.  WEIGHT BEARING RESTRICTIONS No  OBJECTIVE:   TODAY'S TREATMENT:                                        10/5 EXERCISE LOG    RT knee  Exercise Repetitions and Resistance Comments  Recumbent bike  L3 x 15 minutes progressing to seat 6   Knee ext machine 10# x 3 minutes.   Knee flexion 30# 3 minutes.   Leg press 2 plates    6in step UP X10 RT LE   Rockerboard X 3 mins               Manual:  STW to Posterior aspect RT knee f/b extension stretching with active quad sets  Modalities  Date: 09/07/2022 Vasopneumatic on medium x 15 minutes. RT knee    ASSESSMENT:  CLINICAL IMPRESSION: Patient arrived today doing fairly well with RT knee and ambulating with SPC. Rx focused on ROM as well as LE strengthening. PROM 2-122 degrees today. Pt.'s cc is LBP that increases with to much standing. Vaso end of session.   OBJECTIVE IMPAIRMENTS Abnormal gait, decreased activity tolerance, decreased ROM, decreased strength, increased edema, and pain.   ACTIVITY LIMITATIONS locomotion level  PARTICIPATION LIMITATIONS: cleaning and yard work  [employer] POTENTIAL: Excellent  CLINICAL DECISION MAKING: Stable/uncomplicated  EVALUATION COMPLEXITY: Low   GOALS: Goals reviewed with patient? Yes  SHORT TERM GOALS: Target date:  08/26/2022  Ind with an initial HEP. Baseline: Goal status: MET  2.  Full active right knee extension. Baseline:  Goal status: ON-GOING   LONG TERM GOALS: Target date: 09/23/2022   Ind with an advanced HEP. Baseline:  Goal status: ON-GOING  2.  Active right knee flexion to 115 degrees+ so the patient can perform functional tasks and do so with pain not > 2-3/10. Baseline:  Goal status: MET  3.  Increase right hip and knee strength to a solid 5/5 to provide good stability for accomplishment of functional activities. Baseline:  Goal status: ON-GOING  4.  Perform a reciprocating stair gait with one railing with pain not > 2-3/10. Baseline:  Goal status: Partially Met.    up but not down  5.  Perform ADL's with pain not > 2-3/10. Baseline:  Goal status: ON-GOING  PLAN: PT FREQUENCY: 2x/week  PT DURATION: 6 weeks  PLANNED INTERVENTIONS: Therapeutic exercises, Therapeutic activity, Neuromuscular re-education, Gait training, Patient/Family education, Self Care, Electrical stimulation, Cryotherapy, Moist heat, Vasopneumatic device, and Manual therapy  PLAN FOR NEXT SESSION: Nustep with progression to recumbent bike, PROM, PRE's.  Vasopneumatic.   Aizlynn Digilio,CHRIS, PTA 09/09/2022, 2:55 PM   R

## 2022-09-14 ENCOUNTER — Ambulatory Visit: Payer: Medicare Other | Admitting: *Deleted

## 2022-09-14 ENCOUNTER — Other Ambulatory Visit (HOSPITAL_COMMUNITY): Payer: Self-pay

## 2022-09-14 ENCOUNTER — Encounter: Payer: Self-pay | Admitting: *Deleted

## 2022-09-14 DIAGNOSIS — R600 Localized edema: Secondary | ICD-10-CM

## 2022-09-14 DIAGNOSIS — M25661 Stiffness of right knee, not elsewhere classified: Secondary | ICD-10-CM | POA: Diagnosis not present

## 2022-09-14 DIAGNOSIS — M25561 Pain in right knee: Secondary | ICD-10-CM | POA: Diagnosis not present

## 2022-09-14 DIAGNOSIS — M6281 Muscle weakness (generalized): Secondary | ICD-10-CM | POA: Diagnosis not present

## 2022-09-14 DIAGNOSIS — G8929 Other chronic pain: Secondary | ICD-10-CM | POA: Diagnosis not present

## 2022-09-14 NOTE — Therapy (Signed)
OUTPATIENT PHYSICAL THERAPY LOWER EXTREMITY TREATMENT   Patient Name: Jean Howell MRN: [id number] DOB:01/16/1956, 66 y.o., female Today's Date: 09/14/2022   PT End of Session - 09/14/22 1304     Visit Number 14    Number of Visits 18    Date for PT Re-Evaluation 10/26/22    Authorization Type FOTO AT LEAST EVERY 5TH VISIT.  PROGRESS NOTE AT 10TH VISIT.  KX MODIFIER AFTER 15 VISITS.    PT Start Time 1300    PT Stop Time 1351    PT Time Calculation (min) 51 min                Past Medical History:  Diagnosis Date   Arthritis    Depression    Hypertension    Rheumatoid arthritis ([place])    Past Surgical History:  Procedure Laterality Date   BREAST EXCISIONAL BIOPSY Bilateral    pt unsure when- benign   BREAST SURGERY     nodules removed   COLONOSCOPY N/A 06/25/2019   Procedure: COLONOSCOPY;  Surgeon: Danie Binder, MD;  [facility];  Service: Endoscopy;  Laterality: N/A;  1:30   MOLE REMOVAL Right 01/26/2022   foot (patient was put to sleep for procedure)   POLYPECTOMY  06/25/2019   Procedure: POLYPECTOMY;  Surgeon: Danie Binder, MD;  [facility];  Service: Endoscopy;;   TOTAL KNEE ARTHROPLASTY Right 07/05/2022   Procedure: RIGHT TOTAL KNEE ARTHROPLASTY;  Surgeon: Leandrew Koyanagi, MD;  Location: [facility];  Service: Orthopedics;  Laterality: Right;   Patient Active Problem List   Diagnosis Date Noted   Status post total right knee replacement 07/05/2022   Primary osteoarthritis of right knee 06/10/2022   Osteopenia of neck of right femur 03/16/2022   Full code status 06/16/2020   Impaired mobility and endurance 06/16/2020   Sciatica of left side 06/16/2020   Rheumatoid arthritis involving multiple sites with positive rheumatoid factor ([place]) 03/06/2019   Primary osteoarthritis of both knees 03/06/2019   Former smoker 03/06/2019   ANA positive 02/12/2019   Family history of thyroid disease 01/08/2019   Morbid obesity ([place]) 01/08/2019    Essential hypertension 01/08/2019   Family history of colon cancer 01/08/2019    REFERRING PROVIDER: Frankey Shown MD  REFERRING DIAG: S/p right total knee replacement.  THERAPY DIAG:  Chronic pain of right knee  Stiffness of right knee, not elsewhere classified  Localized edema  Muscle weakness (generalized)  Rationale for Evaluation and Treatment Rehabilitation  ONSET DATE: 07/05/22 (surgery date).  SUBJECTIVE:   SUBJECTIVE STATEMENT: Pian about a 3/10.RT knee. Did good after last Rx. My ankles are hurting today   PERTINENT HISTORY: OA, HTN, RA. LBP  PAIN:  Are you having pain? Yes: NPRS scale: 2-3/10 Pain location: Right knee. Pain description: Soreness Aggravating factors: Walking. Relieving factors: Medication.  PRECAUTIONS: Other: Ultrasound.  WEIGHT BEARING RESTRICTIONS No  OBJECTIVE:   TODAY'S TREATMENT:                                        10/10 EXERCISE LOG    RT knee  Exercise Repetitions and Resistance Comments  Recumbent bike  L3 x 15 minutes progressing to seat 6   Knee ext machine 10# x 3 minutes.   Knee flexion 30# 3 minutes.   Leg press 2 plates    6in step UP    Rockerboard  X 2 mins                  Manual: STW to Posterior aspect RT knee f/b extension stretching with active quad sets and heel prop  Modalities  Date: 09/07/2022 Vasopneumatic on medium x 15 minutes. RT knee    ASSESSMENT:  CLINICAL IMPRESSION: Patient arrived today doing fairly well with RT knee and ambulating with SPC. She did well with the knee extension and flexion machine today, but unable to perform many exs in standing today due to ankle pain.  After PROM Pt was able to reach full extension ROM.  Vaso end of session.   OBJECTIVE IMPAIRMENTS Abnormal gait, decreased activity tolerance, decreased ROM, decreased strength, increased edema, and pain.   ACTIVITY LIMITATIONS locomotion level  PARTICIPATION LIMITATIONS: cleaning and yard work  [employer] POTENTIAL:  Excellent  CLINICAL DECISION MAKING: Stable/uncomplicated  EVALUATION COMPLEXITY: Low   GOALS: Goals reviewed with patient? Yes  SHORT TERM GOALS: Target date: 08/26/2022  Ind with an initial HEP. Baseline: Goal status: MET  2.  Full active right knee extension. Baseline:  Goal status: MET   LONG TERM GOALS: Target date: 09/23/2022   Ind with an advanced HEP. Baseline:  Goal status: ON-GOING  2.  Active right knee flexion to 115 degrees+ so the patient can perform functional tasks and do so with pain not > 2-3/10. Baseline:  Goal status: MET  3.  Increase right hip and knee strength to a solid 5/5 to provide good stability for accomplishment of functional activities. Baseline:  Goal status: ON-GOING  4.  Perform a reciprocating stair gait with one railing with pain not > 2-3/10. Baseline:  Goal status: Partially Met.    up but not down  5.  Perform ADL's with pain not > 2-3/10. Baseline:  Goal status: ON-GOING  PLAN: PT FREQUENCY: 2x/week  PT DURATION: 6 weeks  PLANNED INTERVENTIONS: Therapeutic exercises, Therapeutic activity, Neuromuscular re-education, Gait training, Patient/Family education, Self Care, Electrical stimulation, Cryotherapy, Moist heat, Vasopneumatic device, and Manual therapy  PLAN FOR NEXT SESSION:  recumbent bike, PROM, PRE's.  Vasopneumatic.   Clara Smolen,CHRIS, PTA 09/14/2022, 3:34 PM   R

## 2022-09-16 ENCOUNTER — Encounter: Payer: Self-pay | Admitting: *Deleted

## 2022-09-16 ENCOUNTER — Ambulatory Visit: Payer: Medicare Other | Admitting: *Deleted

## 2022-09-16 DIAGNOSIS — M6281 Muscle weakness (generalized): Secondary | ICD-10-CM | POA: Diagnosis not present

## 2022-09-16 DIAGNOSIS — M25661 Stiffness of right knee, not elsewhere classified: Secondary | ICD-10-CM

## 2022-09-16 DIAGNOSIS — M25561 Pain in right knee: Secondary | ICD-10-CM | POA: Diagnosis not present

## 2022-09-16 DIAGNOSIS — R600 Localized edema: Secondary | ICD-10-CM

## 2022-09-16 DIAGNOSIS — G8929 Other chronic pain: Secondary | ICD-10-CM

## 2022-09-16 NOTE — Therapy (Signed)
OUTPATIENT PHYSICAL THERAPY LOWER EXTREMITY TREATMENT   Patient Name: Toneisha Savary MRN: [id number] DOB:07-Jun-1956, 66 y.o., female Today's Date: 09/16/2022   PT End of Session - 09/16/22 1318     Visit Number 15    Number of Visits 18    Date for PT Re-Evaluation 10/26/22    Authorization Type FOTO AT LEAST EVERY 5TH VISIT.  PROGRESS NOTE AT 10TH VISIT.  KX MODIFIER AFTER 15 VISITS.    PT Start Time 1300    PT Stop Time 1352    PT Time Calculation (min) 52 min                Past Medical History:  Diagnosis Date   Arthritis    Depression    Hypertension    Rheumatoid arthritis ([place])    Past Surgical History:  Procedure Laterality Date   BREAST EXCISIONAL BIOPSY Bilateral    pt unsure when- benign   BREAST SURGERY     nodules removed   COLONOSCOPY N/A 06/25/2019   Procedure: COLONOSCOPY;  Surgeon: Danie Binder, MD;  [facility];  Service: Endoscopy;  Laterality: N/A;  1:30   MOLE REMOVAL Right 01/26/2022   foot (patient was put to sleep for procedure)   POLYPECTOMY  06/25/2019   Procedure: POLYPECTOMY;  Surgeon: Danie Binder, MD;  [facility];  Service: Endoscopy;;   TOTAL KNEE ARTHROPLASTY Right 07/05/2022   Procedure: RIGHT TOTAL KNEE ARTHROPLASTY;  Surgeon: Leandrew Koyanagi, MD;  Location: [facility];  Service: Orthopedics;  Laterality: Right;   Patient Active Problem List   Diagnosis Date Noted   Status post total right knee replacement 07/05/2022   Primary osteoarthritis of right knee 06/10/2022   Osteopenia of neck of right femur 03/16/2022   Full code status 06/16/2020   Impaired mobility and endurance 06/16/2020   Sciatica of left side 06/16/2020   Rheumatoid arthritis involving multiple sites with positive rheumatoid factor ([place]) 03/06/2019   Primary osteoarthritis of both knees 03/06/2019   Former smoker 03/06/2019   ANA positive 02/12/2019   Family history of thyroid disease 01/08/2019   Morbid obesity ([place]) 01/08/2019    Essential hypertension 01/08/2019   Family history of colon cancer 01/08/2019    REFERRING PROVIDER: Frankey Shown MD  REFERRING DIAG: S/p right total knee replacement.  THERAPY DIAG:  Chronic pain of right knee  Stiffness of right knee, not elsewhere classified  Localized edema  Muscle weakness (generalized)  Rationale for Evaluation and Treatment Rehabilitation  ONSET DATE: 07/05/22 (surgery date).  SUBJECTIVE:   SUBJECTIVE STATEMENT: Pian about a 3/10.RT knee. Did good after last Rx. My ankles are hurting today   PERTINENT HISTORY: OA, HTN, RA. LBP  PAIN:  Are you having pain? Yes: NPRS scale: 2-3/10 Pain location: Right knee. Pain description: Soreness Aggravating factors: Walking. Relieving factors: Medication.  PRECAUTIONS: Other: Ultrasound.  WEIGHT BEARING RESTRICTIONS No  OBJECTIVE:   TODAY'S TREATMENT:                                        10/12     EXERCISE LOG    RT knee  Exercise Repetitions and Resistance Comments  Recumbent bike  L3 x 15 minutes progressing to seat 6   Knee ext machine 10# x 3 minutes.   Knee flexion 30# 3 minutes.   Leg press 2 plates    6in step UP  2x10 with UE assist   Rockerboard                   Manual: STW to Posterior aspect RT knee f/b extension stretching 0-122 degrees today  Modalities  Date: 09/07/2022 Vasopneumatic on medium x 15 minutes. RT knee    ASSESSMENT:  CLINICAL IMPRESSION: Patient arrived today doing fairly well with RT knee and ambulating with SPC still.  She was able to continue with LE strengthening, but is restricted due to LBP. PROM today and Pt was able to reach 0-122 degrees. LTG for strengthening still on-going  OBJECTIVE IMPAIRMENTS Abnormal gait, decreased activity tolerance, decreased ROM, decreased strength, increased edema, and pain.   ACTIVITY LIMITATIONS locomotion level  PARTICIPATION LIMITATIONS: cleaning and yard work  [employer] POTENTIAL: Excellent  CLINICAL DECISION  MAKING: Stable/uncomplicated  EVALUATION COMPLEXITY: Low   GOALS: Goals reviewed with patient? Yes  SHORT TERM GOALS: Target date: 08/26/2022  Ind with an initial HEP. Baseline: Goal status: MET  2.  Full active right knee extension. Baseline:  Goal status: MET   LONG TERM GOALS: Target date: 09/23/2022   Ind with an advanced HEP. Baseline:  Goal status: ON-GOING  2.  Active right knee flexion to 115 degrees+ so the patient can perform functional tasks and do so with pain not > 2-3/10. Baseline:  Goal status: MET  3.  Increase right hip and knee strength to a solid 5/5 to provide good stability for accomplishment of functional activities. Baseline:  Goal status: ON-GOING  4.  Perform a reciprocating stair gait with one railing with pain not > 2-3/10. Baseline:  Goal status: Partially Met.    up but not down  5.  Perform ADL's with pain not > 2-3/10. Baseline:  Goal status: ON-GOING  PLAN: PT FREQUENCY: 2x/week  PT DURATION: 6 weeks  PLANNED INTERVENTIONS: Therapeutic exercises, Therapeutic activity, Neuromuscular re-education, Gait training, Patient/Family education, Self Care, Electrical stimulation, Cryotherapy, Moist heat, Vasopneumatic device, and Manual therapy  PLAN FOR NEXT SESSION:  recumbent bike, PROM, PRE's.  Vasopneumatic.   Eisen Robenson,CHRIS, PTA 09/16/2022, 1:55 PM   R

## 2022-09-20 ENCOUNTER — Ambulatory Visit: Payer: Self-pay | Admitting: *Deleted

## 2022-09-20 ENCOUNTER — Encounter: Payer: Self-pay | Admitting: *Deleted

## 2022-09-20 NOTE — Patient Outreach (Signed)
  Care Coordination   Initial Visit Note   09/20/2022  Name: Seda Kronberg MRN: [id number] DOB: 1956/07/24  Tamar Miano is a 66 y.o. year old female who sees [name], DO for primary care. I spoke with [name] by phone today.  What matters to the patients health and wellness today?  No Interventions Identified.  CSW collaboration with receptionist at [facility] to reschedule patient's follow-up appointment with Primary Care Provider, Dr. Ronnie Doss, for 09/24/2022 at 2:30 pm.   CSW collaboration with Primary Care Provider, Dr. Ronnie Doss to report back aches and need for relief through prescription pain medications.   CSW collaboration with Primary Care Provider, Dr. Sofie Rower to request prescription for Ambien to be called into pharmacy.  SDOH assessments and interventions completed:  Yes.  SDOH Interventions Today    Flowsheet Row Most Recent Value  SDOH Interventions   Food Insecurity Interventions Intervention Not Indicated  Housing Interventions Intervention Not Indicated  Transportation Interventions Intervention Not Indicated  Utilities Interventions Intervention Not Indicated  Alcohol Usage Interventions Intervention Not Indicated (Score <7)  Financial Strain Interventions Intervention Not Indicated  Physical Activity Interventions Patient Refused  Stress Interventions Intervention Not Indicated  Social Connections Interventions Intervention Not Indicated     Care Coordination Interventions Activated:  Yes.   Care Coordination Interventions:  Yes, provided.   Follow up plan: No further intervention required.   Encounter Outcome:  Pt. Visit Completed.   Nat Christen, BSW, MSW, LCSW  Licensed Education officer, [facility]  Mailing [place] [address], [place], [place] [address] Physical Address-[address], [place], [place] [address] Toll Free  Main # [phone] Fax # [phone] Cell # [phone] [email or web address]

## 2022-09-20 NOTE — Patient Instructions (Signed)
Visit Information  Thank you for taking time to visit with me today. Please don't hesitate to contact me if I can be of assistance to you.   Please call the care guide team at [phone] if you need to cancel or reschedule your appointment.   If you are experiencing a Mental Health or Behavioral Health Crisis or need someone to talk to, please call the Suicide and Crisis Lifeline: 988 call the [place] National Suicide Prevention Lifeline: 1-800-273-8255 or TTY: 1-800-799-4 TTY (1-800-799-4889) to talk to a trained counselor call 1-800-273-TALK (toll free, 24 hour hotline) go to [facility] [address], [place] ([phone]) call the [place] Crisis Line: 800-939-9988 call 911  Patient verbalizes understanding of instructions and care plan provided today and agrees to view in MyChart. Active MyChart status and patient understanding of how to access instructions and care plan via MyChart confirmed with patient.     No further follow up required.  Dona Klemann, BSW, MSW, LCSW  Licensed Clinical Social Worker  [facility]  Mailing Address-[address], [place], [place] [address] Physical Address-[address], [place], [place] [address] Toll Free Main # [phone] Fax # [phone] Cell # [phone] [email or web address]            

## 2022-09-21 ENCOUNTER — Ambulatory Visit: Payer: Medicare Other | Admitting: *Deleted

## 2022-09-21 ENCOUNTER — Encounter: Payer: Self-pay | Admitting: *Deleted

## 2022-09-21 DIAGNOSIS — M25661 Stiffness of right knee, not elsewhere classified: Secondary | ICD-10-CM | POA: Diagnosis not present

## 2022-09-21 DIAGNOSIS — R600 Localized edema: Secondary | ICD-10-CM | POA: Diagnosis not present

## 2022-09-21 DIAGNOSIS — M25561 Pain in right knee: Secondary | ICD-10-CM | POA: Diagnosis not present

## 2022-09-21 DIAGNOSIS — G8929 Other chronic pain: Secondary | ICD-10-CM

## 2022-09-21 DIAGNOSIS — M6281 Muscle weakness (generalized): Secondary | ICD-10-CM

## 2022-09-21 NOTE — Therapy (Signed)
OUTPATIENT PHYSICAL THERAPY LOWER EXTREMITY TREATMENT   Patient Name: Jean Howell MRN: [id number] DOB:04/09/1956, 66 y.o., female Today's Date: 09/21/2022   PT End of Session - 09/21/22 1307     Visit Number 16    Number of Visits 18    Date for PT Re-Evaluation 10/26/22    Authorization Type FOTO AT LEAST EVERY 5TH VISIT.  PROGRESS NOTE AT 10TH VISIT.  KX MODIFIER AFTER 15 VISITS.    PT Start Time 1300    PT Stop Time 1359    PT Time Calculation (min) 59 min                Past Medical History:  Diagnosis Date   Arthritis    Depression    Hypertension    Rheumatoid arthritis ([place])    Past Surgical History:  Procedure Laterality Date   BREAST EXCISIONAL BIOPSY Bilateral    pt unsure when- benign   BREAST SURGERY     nodules removed   COLONOSCOPY N/A 06/25/2019   Procedure: COLONOSCOPY;  Surgeon: Danie Binder, MD;  [facility];  Service: Endoscopy;  Laterality: N/A;  1:30   MOLE REMOVAL Right 01/26/2022   foot (patient was put to sleep for procedure)   POLYPECTOMY  06/25/2019   Procedure: POLYPECTOMY;  Surgeon: Danie Binder, MD;  [facility];  Service: Endoscopy;;   TOTAL KNEE ARTHROPLASTY Right 07/05/2022   Procedure: RIGHT TOTAL KNEE ARTHROPLASTY;  Surgeon: Leandrew Koyanagi, MD;  Location: [facility];  Service: Orthopedics;  Laterality: Right;   Patient Active Problem List   Diagnosis Date Noted   Status post total right knee replacement 07/05/2022   Primary osteoarthritis of right knee 06/10/2022   Osteopenia of neck of right femur 03/16/2022   Full code status 06/16/2020   Impaired mobility and endurance 06/16/2020   Sciatica of left side 06/16/2020   Rheumatoid arthritis involving multiple sites with positive rheumatoid factor ([place]) 03/06/2019   Primary osteoarthritis of both knees 03/06/2019   Former smoker 03/06/2019   ANA positive 02/12/2019   Family history of thyroid disease 01/08/2019   Morbid obesity ([place]) 01/08/2019    Essential hypertension 01/08/2019   Family history of colon cancer 01/08/2019    REFERRING PROVIDER: Frankey Shown MD  REFERRING DIAG: S/p right total knee replacement.  THERAPY DIAG:  Chronic pain of right knee  Stiffness of right knee, not elsewhere classified  Localized edema  Muscle weakness (generalized)  Rationale for Evaluation and Treatment Rehabilitation  ONSET DATE: 07/05/22 (surgery date).  SUBJECTIVE:   SUBJECTIVE STATEMENT: Pian about a 3/10.RT knee today.   PERTINENT HISTORY: OA, HTN, RA. LBP  PAIN:  Are you having pain? Yes: NPRS scale: 2-3/10 Pain location: Right knee. Pain description: Soreness Aggravating factors: Walking. Relieving factors: Medication.  PRECAUTIONS: Other: Ultrasound.  WEIGHT BEARING RESTRICTIONS No  OBJECTIVE:   TODAY'S TREATMENT:                                        10/17     EXERCISE LOG    RT knee  Exercise Repetitions and Resistance Comments  Recumbent bike  L3 x 15 minutes progressing to seat 4   Knee ext machine 10# x 3 minutes.   Knee flexion 30# 3 minutes.   Leg press 2 plates    6in step UP 3 x 10 with UE assist   Rockerboard  Manual: STW to Posterior aspect RT knee and quad  f/b extension stretching with heel prop and QS  Modalities  Date: 09/07/2022 Vasopneumatic on medium x 15 minutes. RT knee    ASSESSMENT:  CLINICAL IMPRESSION: Patient arrived today doing fairly well with RT knee. She was able to continue with RT LE strengthening as well as ROM with standing exercises being limited due to LBP. LTG for strengthening still on-going at this time  OBJECTIVE IMPAIRMENTS Abnormal gait, decreased activity tolerance, decreased ROM, decreased strength, increased edema, and pain.   ACTIVITY LIMITATIONS locomotion level  PARTICIPATION LIMITATIONS: cleaning and yard work  [employer] POTENTIAL: Excellent  CLINICAL DECISION MAKING: Stable/uncomplicated  EVALUATION COMPLEXITY:  Low   GOALS: Goals reviewed with patient? Yes  SHORT TERM GOALS: Target date: 08/26/2022  Ind with an initial HEP. Baseline: Goal status: MET  2.  Full active right knee extension. Baseline:  Goal status: MET   LONG TERM GOALS: Target date: 09/23/2022   Ind with an advanced HEP. Baseline:  Goal status: ON-GOING  2.  Active right knee flexion to 115 degrees+ so the patient can perform functional tasks and do so with pain not > 2-3/10. Baseline:  Goal status: MET  3.  Increase right hip and knee strength to a solid 5/5 to provide good stability for accomplishment of functional activities. Baseline:  Goal status: ON-GOING  4.  Perform a reciprocating stair gait with one railing with pain not > 2-3/10. Baseline:  Goal status: Partially Met.    up but not down  5.  Perform ADL's with pain not > 2-3/10. Baseline:  Goal status: ON-GOING  PLAN: PT FREQUENCY: 2x/week  PT DURATION: 6 weeks  PLANNED INTERVENTIONS: Therapeutic exercises, Therapeutic activity, Neuromuscular re-education, Gait training, Patient/Family education, Self Care, Electrical stimulation, Cryotherapy, Moist heat, Vasopneumatic device, and Manual therapy  PLAN FOR NEXT SESSION:  recumbent bike, PROM, PRE's.  Vasopneumatic.   [name],CHRIS, PTA 09/21/2022, 4:34 PM   R

## 2022-09-23 ENCOUNTER — Encounter: Payer: Self-pay | Admitting: *Deleted

## 2022-09-23 ENCOUNTER — Ambulatory Visit: Payer: Medicare Other | Admitting: *Deleted

## 2022-09-23 DIAGNOSIS — M6281 Muscle weakness (generalized): Secondary | ICD-10-CM

## 2022-09-23 DIAGNOSIS — R600 Localized edema: Secondary | ICD-10-CM

## 2022-09-23 DIAGNOSIS — M25661 Stiffness of right knee, not elsewhere classified: Secondary | ICD-10-CM | POA: Diagnosis not present

## 2022-09-23 DIAGNOSIS — M25561 Pain in right knee: Secondary | ICD-10-CM | POA: Diagnosis not present

## 2022-09-23 DIAGNOSIS — G8929 Other chronic pain: Secondary | ICD-10-CM | POA: Diagnosis not present

## 2022-09-23 NOTE — Therapy (Signed)
OUTPATIENT PHYSICAL THERAPY LOWER EXTREMITY TREATMENT   Patient Name: Jean Howell MRN: [id number] DOB:1956/01/15, 66 y.o., female Today's Date: 09/23/2022   PT End of Session - 09/23/22 1326     Visit Number 17    Number of Visits 18    Date for PT Re-Evaluation 10/26/22    Authorization Type FOTO AT LEAST EVERY 5TH VISIT.  PROGRESS NOTE AT 10TH VISIT.  KX MODIFIER AFTER 15 VISITS.    PT Start Time 1300    PT Stop Time 1357    PT Time Calculation (min) 57 min                Past Medical History:  Diagnosis Date   Arthritis    Depression    Hypertension    Rheumatoid arthritis ([place])    Past Surgical History:  Procedure Laterality Date   BREAST EXCISIONAL BIOPSY Bilateral    pt unsure when- benign   BREAST SURGERY     nodules removed   COLONOSCOPY N/A 06/25/2019   Procedure: COLONOSCOPY;  Surgeon: Danie Binder, MD;  [facility];  Service: Endoscopy;  Laterality: N/A;  1:30   MOLE REMOVAL Right 01/26/2022   foot (patient was put to sleep for procedure)   POLYPECTOMY  06/25/2019   Procedure: POLYPECTOMY;  Surgeon: Danie Binder, MD;  [facility];  Service: Endoscopy;;   TOTAL KNEE ARTHROPLASTY Right 07/05/2022   Procedure: RIGHT TOTAL KNEE ARTHROPLASTY;  Surgeon: Leandrew Koyanagi, MD;  Location: [facility];  Service: Orthopedics;  Laterality: Right;   Patient Active Problem List   Diagnosis Date Noted   Status post total right knee replacement 07/05/2022   Primary osteoarthritis of right knee 06/10/2022   Osteopenia of neck of right femur 03/16/2022   Full code status 06/16/2020   Impaired mobility and endurance 06/16/2020   Sciatica of left side 06/16/2020   Rheumatoid arthritis involving multiple sites with positive rheumatoid factor ([place]) 03/06/2019   Primary osteoarthritis of both knees 03/06/2019   Former smoker 03/06/2019   ANA positive 02/12/2019   Family history of thyroid disease 01/08/2019   Morbid obesity ([place]) 01/08/2019    Essential hypertension 01/08/2019   Family history of colon cancer 01/08/2019    REFERRING PROVIDER: Frankey Shown MD  REFERRING DIAG: S/p right total knee replacement.  THERAPY DIAG:  Chronic pain of right knee  Stiffness of right knee, not elsewhere classified  Localized edema  Muscle weakness (generalized)  Rationale for Evaluation and Treatment Rehabilitation  ONSET DATE: 07/05/22 (surgery date).  SUBJECTIVE:   SUBJECTIVE STATEMENT: Pian about a 3-4/10 RT knee today. My back is hurting today as well   PERTINENT HISTORY: OA, HTN, RA. LBP  PAIN:  Are you having pain? Yes: NPRS scale: 2-3/10 Pain location: Right knee. Pain description: Soreness Aggravating factors: Walking. Relieving factors: Medication.  PRECAUTIONS: Other: Ultrasound.  WEIGHT BEARING RESTRICTIONS No  OBJECTIVE:   TODAY'S TREATMENT:                                        10/19     EXERCISE LOG    RT knee  Exercise Repetitions and Resistance Comments  Recumbent bike  L3 x 15 minutes progressing to seat 4   Knee ext machine 10# x 3 minutes.   Knee flexion 30# 3 minutes.   Leg press 2 plates    6in step UP 3  x 10 with UE assist with focus on less UE assist-   Rockerboard                   Manual: STW to Posterior aspect RT knee and quad  f/b extension stretching with heel prop and QS to -4 degrees today  Modalities  Date: 09/07/2022 Vasopneumatic on medium x 10 minutes. RT knee  with elevation    ASSESSMENT:  CLINICAL IMPRESSION:  Pt arrived today doing fairly well with RT knee, but LBP continues to limit her. She was able to continue with LE strengthening exs, but limited in standing exs due to LBP.Marland Kitchen Her ROM continues to do well with flexion at 122 degrees and extension to -4 degrees     OBJECTIVE IMPAIRMENTS Abnormal gait, decreased activity tolerance, decreased ROM, decreased strength, increased edema, and pain.   ACTIVITY LIMITATIONS locomotion level  PARTICIPATION  LIMITATIONS: cleaning and yard work  [employer] POTENTIAL: Excellent  CLINICAL DECISION MAKING: Stable/uncomplicated  EVALUATION COMPLEXITY: Low   GOALS: Goals reviewed with patient? Yes  SHORT TERM GOALS: Target date: 08/26/2022  Ind with an initial HEP. Baseline: Goal status: MET  2.  Full active right knee extension. Baseline:  Goal status: MET   LONG TERM GOALS: Target date: 09/23/2022   Ind with an advanced HEP. Baseline:  Goal status: ON-GOING  2.  Active right knee flexion to 115 degrees+ so the patient can perform functional tasks and do so with pain not > 2-3/10. Baseline:  Goal status: MET  3.  Increase right hip and knee strength to a solid 5/5 to provide good stability for accomplishment of functional activities. Baseline:  Goal status: ON-GOING  4.  Perform a reciprocating stair gait with one railing with pain not > 2-3/10. Baseline:  Goal status: Partially Met.    up but not down  5.  Perform ADL's with pain not > 2-3/10. Baseline:  Goal status: ON-GOING  PLAN: PT FREQUENCY: 2x/week  PT DURATION: 6 weeks  PLANNED INTERVENTIONS: Therapeutic exercises, Therapeutic activity, Neuromuscular re-education, Gait training, Patient/Family education, Self Care, Electrical stimulation, Cryotherapy, Moist heat, Vasopneumatic device, and Manual therapy  PLAN FOR NEXT SESSION:  recumbent bike, PROM, PRE's.  Vasopneumatic. MD Note next visit   Pina Sirianni,CHRIS, PTA 09/23/2022, 5:41 PM   R

## 2022-09-24 ENCOUNTER — Ambulatory Visit: Payer: Medicare Other | Admitting: Family Medicine

## 2022-09-27 ENCOUNTER — Encounter: Payer: Self-pay | Admitting: Physical Therapy

## 2022-09-27 ENCOUNTER — Ambulatory Visit: Payer: Medicare Other | Admitting: Physical Therapy

## 2022-09-27 DIAGNOSIS — M25661 Stiffness of right knee, not elsewhere classified: Secondary | ICD-10-CM

## 2022-09-27 DIAGNOSIS — R600 Localized edema: Secondary | ICD-10-CM | POA: Diagnosis not present

## 2022-09-27 DIAGNOSIS — M25561 Pain in right knee: Secondary | ICD-10-CM | POA: Diagnosis not present

## 2022-09-27 DIAGNOSIS — M6281 Muscle weakness (generalized): Secondary | ICD-10-CM | POA: Diagnosis not present

## 2022-09-27 DIAGNOSIS — G8929 Other chronic pain: Secondary | ICD-10-CM | POA: Diagnosis not present

## 2022-09-27 NOTE — Therapy (Signed)
OUTPATIENT PHYSICAL THERAPY LOWER EXTREMITY TREATMENT   Patient Name: Jean Howell MRN: [id number] DOB:[birth year], 66 y.o., female Today's Date: 09/27/2022   PT End of Session - 09/27/22 1303     Visit Number 17    Number of Visits 18    Authorization Type FOTO AT LEAST EVERY 5TH VISIT.  PROGRESS NOTE AT 10TH VISIT.  KX MODIFIER AFTER 15 VISITS.    PT Start Time 1245    PT Stop Time 1338    PT Time Calculation (min) 53 min    Activity Tolerance Patient tolerated treatment well    Behavior During Therapy WFL for tasks assessed/performed                Past Medical History:  Diagnosis Date   Arthritis    Depression    Hypertension    Rheumatoid arthritis ([place])    Past Surgical History:  Procedure Laterality Date   BREAST EXCISIONAL BIOPSY Bilateral    pt unsure when- benign   BREAST SURGERY     nodules removed   COLONOSCOPY N/A 06/25/2019   Procedure: COLONOSCOPY;  Surgeon: Danie Binder, MD;  [facility];  Service: Endoscopy;  Laterality: N/A;  1:30   MOLE REMOVAL Right 01/26/2022   foot (patient was put to sleep for procedure)   POLYPECTOMY  06/25/2019   Procedure: POLYPECTOMY;  Surgeon: Danie Binder, MD;  [facility];  Service: Endoscopy;;   TOTAL KNEE ARTHROPLASTY Right 07/05/2022   Procedure: RIGHT TOTAL KNEE ARTHROPLASTY;  Surgeon: Leandrew Koyanagi, MD;  Location: [facility];  Service: Orthopedics;  Laterality: Right;   Patient Active Problem List   Diagnosis Date Noted   Status post total right knee replacement 07/05/2022   Primary osteoarthritis of right knee 06/10/2022   Osteopenia of neck of right femur 03/16/2022   Full code status 06/16/2020   Impaired mobility and endurance 06/16/2020   Sciatica of left side 06/16/2020   Rheumatoid arthritis involving multiple sites with positive rheumatoid factor ([place]) 03/06/2019   Primary osteoarthritis of both knees 03/06/2019   Former smoker 03/06/2019   ANA positive 02/12/2019   Family  history of thyroid disease 01/08/2019   Morbid obesity ([place]) 01/08/2019   Essential hypertension 01/08/2019   Family history of colon cancer 01/08/2019    REFERRING PROVIDER: Frankey Shown MD  REFERRING DIAG: S/p right total knee replacement.  THERAPY DIAG:  Chronic pain of right knee  Stiffness of right knee, not elsewhere classified  Localized edema  Muscle weakness (generalized)  Rationale for Evaluation and Treatment Rehabilitation  ONSET DATE: 07/05/22 (surgery date).  SUBJECTIVE:   SUBJECTIVE STATEMENT: Pain in right knee ~ 2/10 PERTINENT HISTORY: OA, HTN, RA. LBP  PAIN:  Are you having pain? Yes: NPRS scale: 2/10 Pain location: Right knee. Pain description: Soreness Aggravating factors: Walking. Relieving factors: Medication.  PRECAUTIONS: Other: Ultrasound.  WEIGHT BEARING RESTRICTIONS No  OBJECTIVE:   TODAY'S TREATMENT:                                        09/27/22    EXERCISE LOG    RT knee  Exercise Repetitions and Resistance Comments  Recumbent bike  L3 x 15 minutes progressing to seat 4   Knee ext machine 10# x 3 minutes.   Knee flexion 30# 3 minutes.   Leg press 2 plates 3 minutes.  Manual:  Sustained right knee extension stretch x 3 minutes.  Modalities  Date: 09/27/2022 Vasopneumatic on low x 20 minutes. RT knee  with elevation    ASSESSMENT:  CLINICAL IMPRESSION:               Patient doing very well.  She did great with weight machines today.  She lacks some extension but minimal.      OBJECTIVE IMPAIRMENTS Abnormal gait, decreased activity tolerance, decreased ROM, decreased strength, increased edema, and pain.   ACTIVITY LIMITATIONS locomotion level  PARTICIPATION LIMITATIONS: cleaning and yard work  [employer] POTENTIAL: Excellent  CLINICAL DECISION MAKING: Stable/uncomplicated  EVALUATION COMPLEXITY: Low   GOALS: Goals reviewed with patient? Yes  SHORT TERM GOALS: Target date: 08/26/2022  Ind  with an initial HEP. Baseline: Goal status: MET  2.  Full active right knee extension. Baseline:  Goal status: MET   LONG TERM GOALS: Target date: 09/23/2022   Ind with an advanced HEP. Baseline:  Goal status: ON-GOING  2.  Active right knee flexion to 115 degrees+ so the patient can perform functional tasks and do so with pain not > 2-3/10. Baseline:  Goal status: MET  3.  Increase right hip and knee strength to a solid 5/5 to provide good stability for accomplishment of functional activities. Baseline:  Goal status: ON-GOING  4.  Perform a reciprocating stair gait with one railing with pain not > 2-3/10. Baseline:  Goal status: Partially Met.    up but not down  5.  Perform ADL's with pain not > 2-3/10. Baseline:  Goal status: ON-GOING  PLAN: PT FREQUENCY: 2x/week  PT DURATION: 6 weeks  PLANNED INTERVENTIONS: Therapeutic exercises, Therapeutic activity, Neuromuscular re-education, Gait training, Patient/Family education, Self Care, Electrical stimulation, Cryotherapy, Moist heat, Vasopneumatic device, and Manual therapy  PLAN FOR NEXT SESSION:  recumbent bike, PROM, PRE's.  Vasopneumatic. MD Note next visit   Jamica Woodyard, [place], PT 09/27/2022, 1:39 PM   R

## 2022-09-28 ENCOUNTER — Ambulatory Visit (INDEPENDENT_AMBULATORY_CARE_PROVIDER_SITE_OTHER): Payer: Medicare Other | Admitting: Orthopaedic Surgery

## 2022-09-28 DIAGNOSIS — Z96651 Presence of right artificial knee joint: Secondary | ICD-10-CM

## 2022-09-28 NOTE — Progress Notes (Signed)
Post-Op Visit Note   Patient: Jean Howell           Date of Birth: 09/12/1956           MRN: [id number] Visit Date: 09/28/2022 PCP: [name], DO   Assessment & Plan:  Chief Complaint:  Chief Complaint  Patient presents with   Right Knee - Routine Post Op   Visit Diagnoses:  1. Status post total right knee replacement     Plan: Jean Howell is 3 months status post right total knee replacement on 07/05/2022.  She is doing great in that regard.  She is very pleased with the outcome.  Uses a cane for ambulation in public.  Examination of the right knee shows a fully healed surgical scar.  She has excellent functional range of motion.  Good varus valgus stability.  Overall I am very pleased with how Jean Howell did from her surgery.  Dental prophylaxis reinforced.  Recheck in 3 months with two-view x-rays of the right knee.  Follow-Up Instructions: Return in about 3 months (around 12/29/2022).   Orders:  No orders of the defined types were placed in this encounter.  No orders of the defined types were placed in this encounter.   Imaging: No results found.  PMFS History: Patient Active Problem List   Diagnosis Date Noted   Status post total right knee replacement 07/05/2022   Primary osteoarthritis of right knee 06/10/2022   Osteopenia of neck of right femur 03/16/2022   Full code status 06/16/2020   Impaired mobility and endurance 06/16/2020   Sciatica of left side 06/16/2020   Rheumatoid arthritis involving multiple sites with positive rheumatoid factor ([place]) 03/06/2019   Primary osteoarthritis of both knees 03/06/2019   Former smoker 03/06/2019   ANA positive 02/12/2019   Family history of thyroid disease 01/08/2019   Morbid obesity ([place]) 01/08/2019   Essential hypertension 01/08/2019   Family history of colon cancer 01/08/2019   Past Medical History:  Diagnosis Date   Arthritis    Depression    Hypertension    Rheumatoid arthritis ([place])     Family  History  Problem Relation Age of Onset   Cancer Father    Colon cancer Father    Thyroid disease Mother    CVA Mother 73   Thyroid disease Sister    Healthy Son    Congestive Heart Failure Sister    Hypertension Sister    Diabetes Sister    Hypertension Sister    Diabetes Sister    Hypertension Sister    Hypertension Sister    Cancer Brother        kidney     Past Surgical History:  Procedure Laterality Date   BREAST EXCISIONAL BIOPSY Bilateral    pt unsure when- benign   BREAST SURGERY     nodules removed   COLONOSCOPY N/A 06/25/2019   Procedure: COLONOSCOPY;  Surgeon: Danie Binder, MD;  [facility];  Service: Endoscopy;  Laterality: N/A;  1:30   MOLE REMOVAL Right 01/26/2022   foot (patient was put to sleep for procedure)   POLYPECTOMY  06/25/2019   Procedure: POLYPECTOMY;  Surgeon: Danie Binder, MD;  [facility];  Service: Endoscopy;;   TOTAL KNEE ARTHROPLASTY Right 07/05/2022   Procedure: RIGHT TOTAL KNEE ARTHROPLASTY;  Surgeon: Leandrew Koyanagi, MD;  Location: [facility];  Service: Orthopedics;  Laterality: Right;   Social History   Occupational History   Occupation: retired  Tobacco Use  Smoking status: Former    Packs/day: 1.00    Years: 15.00    Total pack years: 15.00    Types: Cigarettes    Quit date: 2000    Years since quitting: 23.8    Passive exposure: Past   Smokeless tobacco: Never  Vaping Use   Vaping Use: Never used  Substance and Sexual Activity   Alcohol use: Not Currently   Drug use: Never   Sexual activity: Not Currently    Comment: widowed

## 2022-09-29 ENCOUNTER — Ambulatory Visit: Payer: Medicare Other | Admitting: Physical Therapy

## 2022-09-29 ENCOUNTER — Encounter: Payer: Self-pay | Admitting: Physical Therapy

## 2022-09-29 DIAGNOSIS — M25661 Stiffness of right knee, not elsewhere classified: Secondary | ICD-10-CM

## 2022-09-29 DIAGNOSIS — M6281 Muscle weakness (generalized): Secondary | ICD-10-CM | POA: Diagnosis not present

## 2022-09-29 DIAGNOSIS — M25561 Pain in right knee: Secondary | ICD-10-CM | POA: Diagnosis not present

## 2022-09-29 DIAGNOSIS — G8929 Other chronic pain: Secondary | ICD-10-CM | POA: Diagnosis not present

## 2022-09-29 DIAGNOSIS — R600 Localized edema: Secondary | ICD-10-CM

## 2022-09-29 NOTE — Therapy (Addendum)
OUTPATIENT PHYSICAL THERAPY LOWER EXTREMITY TREATMENT   Patient Name: Jean Howell MRN: [id number] DOB:[birth year], 66 y.o., female Today's Date: 09/29/2022   PT End of Session - 09/29/22 1333     Visit Number 18    Number of Visits 18    Date for PT Re-Evaluation 10/26/22    Authorization Type FOTO AT LEAST EVERY 5TH VISIT.  PROGRESS NOTE AT 10TH VISIT.  KX MODIFIER AFTER 15 VISITS.    PT Start Time 1302    PT Stop Time 1340    PT Time Calculation (min) 38 min    Activity Tolerance Patient tolerated treatment well    Behavior During Therapy WFL for tasks assessed/performed            Past Medical History:  Diagnosis Date   Arthritis    Depression    Hypertension    Rheumatoid arthritis ([place])    Past Surgical History:  Procedure Laterality Date   BREAST EXCISIONAL BIOPSY Bilateral    pt unsure when- benign   BREAST SURGERY     nodules removed   COLONOSCOPY N/A 06/25/2019   Procedure: COLONOSCOPY;  Surgeon: Danie Binder, MD;  [facility];  Service: Endoscopy;  Laterality: N/A;  1:30   MOLE REMOVAL Right 01/26/2022   foot (patient was put to sleep for procedure)   POLYPECTOMY  06/25/2019   Procedure: POLYPECTOMY;  Surgeon: Danie Binder, MD;  [facility];  Service: Endoscopy;;   TOTAL KNEE ARTHROPLASTY Right 07/05/2022   Procedure: RIGHT TOTAL KNEE ARTHROPLASTY;  Surgeon: Leandrew Koyanagi, MD;  Location: [place];  Service: Orthopedics;  Laterality: Right;   Patient Active Problem List   Diagnosis Date Noted   Status post total right knee replacement 07/05/2022   Primary osteoarthritis of right knee 06/10/2022   Osteopenia of neck of right femur 03/16/2022   Full code status 06/16/2020   Impaired mobility and endurance 06/16/2020   Sciatica of left side 06/16/2020   Rheumatoid arthritis involving multiple sites with positive rheumatoid factor ([place]) 03/06/2019   Primary osteoarthritis of both knees 03/06/2019   Former smoker 03/06/2019    ANA positive 02/12/2019   Family history of thyroid disease 01/08/2019   Morbid obesity ([place]) 01/08/2019   Essential hypertension 01/08/2019   Family history of colon cancer 01/08/2019   REFERRING PROVIDER: Frankey Shown MD  REFERRING DIAG: S/p right total knee replacement.  THERAPY DIAG:  Chronic pain of right knee  Stiffness of right knee, not elsewhere classified  Localized edema  Muscle weakness (generalized)  Rationale for Evaluation and Treatment Rehabilitation  ONSET DATE: 07/05/22 (surgery date).  SUBJECTIVE:   SUBJECTIVE STATEMENT: Reports that her knee is great compared to the ankle pain she is experiencing. The ankle pain is what is limiting her activity level now.  PERTINENT HISTORY: OA, HTN, RA. LBP  PAIN:  Are you having pain? Yes, 5/10 B ankles  PRECAUTIONS: Other: Ultrasound.  WEIGHT BEARING RESTRICTIONS No  OBJECTIVE:     FOTO: 27% limitation at discharge    TODAY'S TREATMENT:       MMT/ AROM Right 10/25 Right 10/25  Hip flexion    Hip extension    Hip abduction    Hip adduction    Hip internal rotation    Hip external rotation    Knee flexion 4/5 126  Knee extension 4+/5 0  Ankle dorsiflexion    Ankle plantarflexion    Ankle inversion    Ankle eversion     (Blank  rows = not tested)                                    09/29/22    EXERCISE LOG    RT knee  Exercise Repetitions and Resistance Comments  Recumbent bike  L3 x 15 minutes progressing to seat 4   Knee ext machine 10# x 30 reps   Knee flexion 30# x30 reps                            Modalities  Date: 09/29/22 Vaso: Knee, Low, 10 mins, Edema  ASSESSMENT:  CLINICAL IMPRESSION:               Patient presented in clinic with request for discharge and a good report from MD visit yesterday. Patient now being overall limited by ankle pain. Limited walking tolerance due to ankle pain but states that her ankle pain overrides any knee pain that may be present. Continues to use  [facility] for ambulation for longer distances but does not use SPC for short distances. Able to meet most goals in PT such as ROM but strength slightly less than goal status as well as stair gait which may or may not be due to ankle pain. Normal vasopneumatic response noted following removal of the modality.   OBJECTIVE IMPAIRMENTS Abnormal gait, decreased activity tolerance, decreased ROM, decreased strength, increased edema, and pain.   ACTIVITY LIMITATIONS locomotion level  PARTICIPATION LIMITATIONS: cleaning and yard work  [employer] POTENTIAL: Excellent  CLINICAL DECISION MAKING: Stable/uncomplicated  EVALUATION COMPLEXITY: Low  GOALS: Goals reviewed with patient? Yes  SHORT TERM GOALS: Target date: 08/26/2022  Ind with an initial HEP. Baseline: Goal status: MET  2.  Full active right knee extension. Baseline:  Goal status: MET  LONG TERM GOALS: Target date: 09/23/2022   Ind with an advanced HEP. Baseline:  Goal status: UNABLE TO ASSESS  2.  Active right knee flexion to 115 degrees+ so the patient can perform functional tasks and do so with pain not > 2-3/10. Baseline:  Goal status: MET  3.  Increase right hip and knee strength to a solid 5/5 to provide good stability for accomplishment of functional activities. Baseline:  Goal status: NOT MET  4.  Perform a reciprocating stair gait with one railing with pain not > 2-3/10. Baseline:  Goal status: PARTIALLY MET  5.  Perform ADL's with pain not > 2-3/10. Baseline:  Goal status: MET  PLAN: PT FREQUENCY: 2x/week  PT DURATION: 6 weeks  PLANNED INTERVENTIONS: Therapeutic exercises, Therapeutic activity, Neuromuscular re-education, Gait training, Patient/Family education, Self Care, Electrical stimulation, Cryotherapy, Moist heat, Vasopneumatic device, and Manual therapy  PLAN FOR NEXT SESSION:  recumbent bike, PROM, PRE's.  Vasopneumatic. MD Note next visit   Standley Brooking, PTA 09/29/2022, 1:44 PM   PHYSICAL THERAPY  DISCHARGE SUMMARY  Visits from Start of Care: 18.  Current functional level related to goals / functional outcomes: See above.   Remaining deficits: See goal section.   Education / Equipment: HEP.   Patient agrees to discharge. Patient goals were partially met. Patient is being discharged due to being pleased with the current functional level.    Mali Applegate MPT

## 2022-09-30 ENCOUNTER — Other Ambulatory Visit (HOSPITAL_COMMUNITY): Payer: Self-pay

## 2022-09-30 ENCOUNTER — Encounter: Payer: Medicare Other | Admitting: *Deleted

## 2022-10-04 ENCOUNTER — Telehealth: Payer: Self-pay | Admitting: *Deleted

## 2022-10-04 NOTE — Telephone Encounter (Signed)
Please call patient and advise if you would like another monitor place.   Account: [facility]      We are reaching out to notify you that we received the following ZIO monitor device with No Data. As such, no report will be generated for this device and you may want to contact the patient in question for a possible re-patch     Asset NI:[id number]

## 2022-10-04 NOTE — Telephone Encounter (Signed)
Lmtcb.

## 2022-10-04 NOTE — Telephone Encounter (Signed)
Yes please place a new one on. She had LOC and we need to evaluate for arrhythmia.

## 2022-10-06 ENCOUNTER — Other Ambulatory Visit (HOSPITAL_COMMUNITY): Payer: Self-pay

## 2022-10-06 NOTE — Telephone Encounter (Signed)
Spoke with patient and offered appointment to place Zio monitor again either today or tomorrow.  She states she cannot come until Monday 10/11/22.  Appointment scheduled for that date.

## 2022-10-11 ENCOUNTER — Ambulatory Visit (INDEPENDENT_AMBULATORY_CARE_PROVIDER_SITE_OTHER): Payer: Medicare Other | Admitting: *Deleted

## 2022-10-11 ENCOUNTER — Other Ambulatory Visit: Payer: Self-pay | Admitting: Family Medicine

## 2022-10-11 ENCOUNTER — Other Ambulatory Visit (INDEPENDENT_AMBULATORY_CARE_PROVIDER_SITE_OTHER): Payer: Medicare Other

## 2022-10-11 ENCOUNTER — Other Ambulatory Visit (HOSPITAL_COMMUNITY): Payer: Self-pay

## 2022-10-11 DIAGNOSIS — R55 Syncope and collapse: Secondary | ICD-10-CM | POA: Diagnosis not present

## 2022-10-12 DIAGNOSIS — Z1283 Encounter for screening for malignant neoplasm of skin: Secondary | ICD-10-CM | POA: Diagnosis not present

## 2022-10-12 DIAGNOSIS — D225 Melanocytic nevi of trunk: Secondary | ICD-10-CM | POA: Diagnosis not present

## 2022-10-22 ENCOUNTER — Other Ambulatory Visit (HOSPITAL_COMMUNITY): Payer: Self-pay

## 2022-10-26 ENCOUNTER — Ambulatory Visit: Payer: Medicare Other | Admitting: Family Medicine

## 2022-10-27 ENCOUNTER — Other Ambulatory Visit (HOSPITAL_COMMUNITY): Payer: Self-pay

## 2022-10-30 ENCOUNTER — Other Ambulatory Visit: Payer: Self-pay | Admitting: Physician Assistant

## 2022-11-01 ENCOUNTER — Telehealth: Payer: Self-pay | Admitting: Rheumatology

## 2022-11-01 DIAGNOSIS — Z79899 Other long term (current) drug therapy: Secondary | ICD-10-CM

## 2022-11-01 NOTE — Telephone Encounter (Signed)
Next Visit: 11/16/2022  Last Visit: 08/17/2022  Last Fill: 08/03/2022  DX: Rheumatoid arthritis involving multiple sites with positive rheumatoid factor   Current Dose per office note 08/26/2022: Arava 20 mg 1 tablet by mouth daily.     Labs: 07/29/2022 EOS (Absolute) 0.6, Glucose 103, Creat. 1.06, GFR 57  Patient to update labs at upcoming appointment on 11/16/2022  Okay to refill Arava?

## 2022-11-01 NOTE — Telephone Encounter (Signed)
Lab Orders faxed as requested.

## 2022-11-01 NOTE — Telephone Encounter (Signed)
Patient called the office requesting her lab orders be sent to [facility]. Patient states she is going Wednesday.

## 2022-11-02 ENCOUNTER — Other Ambulatory Visit (HOSPITAL_COMMUNITY): Payer: Self-pay

## 2022-11-03 ENCOUNTER — Other Ambulatory Visit: Payer: Medicare Other

## 2022-11-03 DIAGNOSIS — Z79899 Other long term (current) drug therapy: Secondary | ICD-10-CM

## 2022-11-03 DIAGNOSIS — R55 Syncope and collapse: Secondary | ICD-10-CM | POA: Diagnosis not present

## 2022-11-04 LAB — CMP14+EGFR
ALT: 15 IU/L (ref 0–32)
AST: 17 IU/L (ref 0–40)
Albumin/Globulin Ratio: 1 — ABNORMAL LOW (ref 1.2–2.2)
Albumin: 3.9 g/dL (ref 3.9–4.9)
Alkaline Phosphatase: 81 IU/L (ref 44–121)
BUN/Creatinine Ratio: 15 (ref 12–28)
BUN: 14 mg/dL (ref 8–27)
Bilirubin Total: 0.4 mg/dL (ref 0.0–1.2)
CO2: 25 mmol/L (ref 20–29)
Calcium: 9.9 mg/dL (ref 8.7–10.3)
Chloride: 99 mmol/L (ref 96–106)
Creatinine, Ser: 0.93 mg/dL (ref 0.57–1.00)
Globulin, Total: 4 g/dL (ref 1.5–4.5)
Glucose: 96 mg/dL (ref 70–99)
Potassium: 3.9 mmol/L (ref 3.5–5.2)
Sodium: 140 mmol/L (ref 134–144)
Total Protein: 7.9 g/dL (ref 6.0–8.5)
eGFR: 67 mL/min/{1.73_m2} (ref >59)

## 2022-11-04 LAB — CBC WITH DIFFERENTIAL/PLATELET
Basophils Absolute: 0 10*3/uL (ref 0.0–0.2)
Basos: 0 %
EOS (ABSOLUTE): 0.1 10*3/uL (ref 0.0–0.4)
Eos: 2 %
Hematocrit: 39 % (ref 34.0–46.6)
Hemoglobin: 12.9 g/dL (ref 11.1–15.9)
Immature Grans (Abs): 0 10*3/uL (ref 0.0–0.1)
Immature Granulocytes: 0 %
Lymphocytes Absolute: 2.1 10*3/uL (ref 0.7–3.1)
Lymphs: 34 %
MCH: 27.2 pg (ref 26.6–33.0)
MCHC: 33.1 g/dL (ref 31.5–35.7)
MCV: 82 fL (ref 79–97)
Monocytes Absolute: 0.2 10*3/uL (ref 0.1–0.9)
Monocytes: 4 %
Neutrophils Absolute: 3.6 10*3/uL (ref 1.4–7.0)
Neutrophils: 60 %
Platelets: 341 10*3/uL (ref 150–450)
RBC: 4.74 x10E6/uL (ref 3.77–5.28)
RDW: 16.4 % — ABNORMAL HIGH (ref 11.7–15.4)
WBC: 6 10*3/uL (ref 3.4–10.8)

## 2022-11-04 NOTE — Progress Notes (Unsigned)
Office Visit Note  Patient: Jean Howell             Date of Birth: 1956-02-25           MRN: [id number]             PCP: [name], DO Referring: [name], DO Visit Date: 11/16/2022 Occupation: '@GUAROCC'$ @  Subjective:  Pain in multiple joints   History of Present Illness: Jean Howell is a 66 y.o. female with history of seropositive rheumatoid arthritis.  She is taking arava 20 mg 1 tablet by mouth daily and enbrel 50 mg sq injections once weekly.   She denies any side effects or injection site reactions.   She has not missed any doses recently.  She denies any recent or recurrent infections.  She has intermittent both hands, both wrist joints, both ankle joints, and intermittently in the left knee.  She has intermittent swelling in her hands.  Patient had a right total knee replacement on 07/05/2022 performed by Dr. Erlinda Hong.  She states her right knee joint pain has improved significantly.  She denies any new medical conditions.    Activities of Daily Living:  Patient reports morning stiffness for all day. Patient Reports nocturnal pain.  Difficulty dressing/grooming: Denies Difficulty climbing stairs: Reports Difficulty getting out of chair: Reports Difficulty using hands for taps, buttons, cutlery, and/or writing: Reports  Review of Systems  Constitutional:  Negative for fatigue.  HENT:  Negative for mouth sores and mouth dryness.   Eyes:  Negative for dryness.  Respiratory:  Negative for shortness of breath.   Cardiovascular:  Negative for chest pain and palpitations.  Gastrointestinal:  Negative for blood in stool, constipation and diarrhea.  Endocrine: Negative for increased urination.  Genitourinary:  Negative for involuntary urination.  Musculoskeletal:  Positive for joint pain, gait problem, joint pain, joint swelling, myalgias, muscle weakness, morning stiffness, muscle tenderness and myalgias.  Skin:  Positive for hair loss. Negative for color  change, rash and sensitivity to sunlight.  Allergic/Immunologic: Negative for susceptible to infections.  Neurological:  Negative for dizziness and headaches.  Hematological:  Negative for swollen glands.  Psychiatric/Behavioral:  Negative for depressed mood and sleep disturbance. The patient is not nervous/anxious.     PMFS History:  Patient Active Problem List   Diagnosis Date Noted   Status post total right knee replacement 07/05/2022   Primary osteoarthritis of right knee 06/10/2022   Osteopenia of neck of right femur 03/16/2022   Full code status 06/16/2020   Impaired mobility and endurance 06/16/2020   Sciatica of left side 06/16/2020   Rheumatoid arthritis involving multiple sites with positive rheumatoid factor ([place]) 03/06/2019   Primary osteoarthritis of both knees 03/06/2019   Former smoker 03/06/2019   ANA positive 02/12/2019   Family history of thyroid disease 01/08/2019   Morbid obesity ([place]) 01/08/2019   Essential hypertension 01/08/2019   Family history of colon cancer 01/08/2019    Past Medical History:  Diagnosis Date   Arthritis    Depression    Hypertension    Rheumatoid arthritis ([place])     Family History  Problem Relation Age of Onset   Cancer Father    Colon cancer Father    Thyroid disease Mother    CVA Mother 78   Thyroid disease Sister    Healthy Son    Congestive Heart Failure Sister    Hypertension Sister    Diabetes Sister    Hypertension Sister    Diabetes Sister  Hypertension Sister    Hypertension Sister    Cancer Brother        kidney    Past Surgical History:  Procedure Laterality Date   BREAST EXCISIONAL BIOPSY Bilateral    pt unsure when- benign   BREAST SURGERY     nodules removed   COLONOSCOPY N/A 06/25/2019   Procedure: COLONOSCOPY;  Surgeon: Danie Binder, MD;  [facility];  Service: Endoscopy;  Laterality: N/A;  1:30   MOLE REMOVAL Right 01/26/2022   foot (patient was put to sleep for procedure)    POLYPECTOMY  06/25/2019   Procedure: POLYPECTOMY;  Surgeon: Danie Binder, MD;  [facility];  Service: Endoscopy;;   TOTAL KNEE ARTHROPLASTY Right 07/05/2022   Procedure: RIGHT TOTAL KNEE ARTHROPLASTY;  Surgeon: Leandrew Koyanagi, MD;  Location: [place];  Service: Orthopedics;  Laterality: Right;   Social History   Social History Narrative   Patient is a widow that resides independently in [place].  She has 1 son, who resides in [place].   Immunization History  Administered Date(s) Administered   Fluad Quad(high Dose 65+) 09/16/2020, 09/07/2022   Influenza, Quadrivalent, Recombinant, Inj, Pf 09/19/2019   PFIZER(Purple Top)SARS-COV-2 Vaccination 05/23/2020, 06/13/2020, 07/31/2020, 02/28/2021   PNEUMOCOCCAL CONJUGATE-20 07/07/2022     Objective: Vital Signs: BP 117/82 (BP Location: Left Arm, Patient Position: Sitting, Cuff Size: Large)   Pulse 80   Resp 17   Ht '5\' 3"'$  (1.6 m)   Wt 210 lb 9.6 oz (95.5 kg)   BMI 37.31 kg/m    Physical Exam Vitals and nursing note reviewed.  Constitutional:      Appearance: She is well-developed.  HENT:     Head: Normocephalic and atraumatic.  Eyes:     Conjunctiva/sclera: Conjunctivae normal.  Cardiovascular:     Rate and Rhythm: Normal rate and regular rhythm.     Heart sounds: Normal heart sounds.  Pulmonary:     Effort: Pulmonary effort is normal.     Breath sounds: Normal breath sounds.  Abdominal:     General: Bowel sounds are normal.     Palpations: Abdomen is soft.  Musculoskeletal:     Cervical back: Normal range of motion.  Skin:    General: Skin is warm and dry.     Capillary Refill: Capillary refill takes less than 2 seconds.  Neurological:     Mental Status: She is alert and oriented to person, place, and time.  Psychiatric:        Behavior: Behavior normal.      Musculoskeletal Exam: C-spine has good range of motion with no discomfort.  Shoulder joints have good range of motion with no discomfort.  Elbow  joints have good range of motion with no tenderness or synovitis.  Some discomfort with range of motion of both wrist joints.  No tenderness or synovitis over MCP joints.  Complete fist formation bilaterally.  Hip joints have slightly limited range of motion but no groin pain currently.  Right knee replacement has good range of motion with no warmth or effusion.  Left knee joint has good range of motion with no warmth or effusion.  Ankle joints have good range of motion with some tenderness of the right ankle joint.  No tenderness or synovitis of MTP joints.   CDAI Exam: CDAI Score: 0.8  Patient Global: 6 mm; Provider Global: 2 mm Swollen: 0 ; Tender: 1  Joint Exam 11/16/2022      Right  Left  Ankle   Tender        Investigation: No additional findings.  Imaging: DG Lumbar Spine 2-3 Views  Result Date: 11/09/2022 CLINICAL DATA:  Low back pain. EXAM: LUMBAR SPINE - 2-3 VIEW COMPARISON:  None Available. FINDINGS: There is no evidence of lumbar spine fracture. Alignment is normal. Moderate degenerative disc disease is noted at all levels of the lumbar spine with anterior osteophyte formation. IMPRESSION: Moderate multilevel degenerative disc disease. No acute abnormality seen. Aortic Atherosclerosis (ICD10-I70.0). Electronically Signed   By: Marijo Conception M.D.   On: 11/09/2022 18:26   LONG TERM MONITOR (3-14 DAYS)  Result Date: 11/05/2022   Several beats of SVT noted. Patch Wear Time:  13 days and 23 hours (2023-11-06T10:50:15-0500 to 2023-11-20T10:50:06-499) Patient had a min HR of 53 bpm, max HR of 200 bpm, and avg HR of 79 bpm. Predominant underlying rhythm was Sinus Rhythm. 1 run of Ventricular Tachycardia occurred lasting 16 beats with a max rate of 200 bpm (avg 181 bpm). 8 Supraventricular Tachycardia runs occurred, the run with the fastest interval lasting 17 beats with a max rate of 185 bpm, the longest lasting 12.8 secs with an avg rate of 126 bpm. Isolated SVEs were rare (<1.0%), SVE  Couplets were rare (<1.0%), and SVE Triplets were rare (<1.0%). Isolated VEs were rare (<1.0%, 2356), VE Couplets were rare (<1.0%, 9), and VE Triplets were rare (<1.0%, 4). Ventricular Trigeminy was present.    Recent Labs: Lab Results  Component Value Date   WBC 6.0 11/03/2022   HGB 12.9 11/03/2022   PLT 341 11/03/2022   NA 140 11/03/2022   K 3.9 11/03/2022   CL 99 11/03/2022   CO2 25 11/03/2022   GLUCOSE 96 11/03/2022   BUN 14 11/03/2022   CREATININE 0.93 11/03/2022   BILITOT 0.4 11/03/2022   ALKPHOS 81 11/03/2022   AST 17 11/03/2022   ALT 15 11/03/2022   PROT 7.9 11/03/2022   ALBUMIN 3.9 11/03/2022   CALCIUM 9.9 11/03/2022   GFRAA 88 01/07/2021   QFTBGOLDPLUS NEGATIVE 03/01/2022    Speciality Comments: PLQ Eye Exam: 12/16/2018 WNL @ Dr Anthony Sar Follow up in 1 year  Procedures:  No procedures performed Allergies: Patient has no known allergies.   Assessment / Plan:     Visit Diagnoses: Rheumatoid arthritis involving multiple sites with positive rheumatoid factor ([facility]) - Positive RF, +14-3-3 eta, Positive ANA, erosive inflammatory: She has no synovitis on examination today.  She experiences intermittent discomfort in both wrist joints, both hands, left knee joint, and both ankle joints.  She is currently on Enbrel 50 mg subcutaneous injections once weekly and Arava 20 mg 1 tablet by mouth daily.  She is tolerating combination therapy without any side effects and has not missed any doses recently.  Overall she feels that her rheumatoid arthritis is well-controlled on the current treatment regimen.  She has no active inflammation on examination today.  No medication changes will be made at this time.  She was advised to notify us if she develops signs or symptoms of a flare.  She will follow-up in the office in 3 months or sooner if needed.  High risk medication use - Enbrel 50 mg sq injections once weekly and Arava 20 mg 1 tablet by mouth daily.   CBC and CMP updated on 11/03/22.  Her next lab work will be due in February and every 3 months.Standing orders for CBC and CMP remain in place.  TB Gold negative on 03/01/2022.  Future  order for TB Gold placed today. She has not had any recent or recurrent infections.  Discussed the importance of holding arava if she develops signs or symptoms of an infection and to resume once the infection has completely cleared.  - Plan: QuantiFERON-TB Gold Plus  Primary osteoarthritis of left knee: She has intermittent discomfort in the left knee joint.  No warmth or effusion noted.  She does not plan on proceeding with a left knee replacement at this time.  S/P TKR (total knee replacement), right - Performed by Dr. Erlinda Hong on 07/05/22. Doing well.  Good ROM with no discomfort.  No warmth or effusion noted.  She has upcoming appointment with Dr. Erlinda Hong in January 2024.   ANA positive - No clinical features of systemic lupus at this time.  Other fatigue: Stable.   Other medical conditions are listed as follows:   Essential hypertension: BP was 117/ 82 today in the office.   History of depression  Former smoker  Family history of colon cancer  Family history of thyroid disease  Orders: Orders Placed This Encounter  Procedures   QuantiFERON-TB Gold Plus   No orders of the defined types were placed in this encounter.   Follow-Up Instructions: Return in about 3 months (around 02/15/2023) for Rheumatoid arthritis.   Ofilia Neas, PA-C  Note - This record has been created using Dragon software.  Chart creation errors have been sought, but may not always  have been located. Such creation errors do not reflect on  the standard of medical care.

## 2022-11-05 ENCOUNTER — Other Ambulatory Visit: Payer: Self-pay | Admitting: Family Medicine

## 2022-11-05 DIAGNOSIS — I471 Supraventricular tachycardia, unspecified: Secondary | ICD-10-CM

## 2022-11-08 ENCOUNTER — Telehealth: Payer: Self-pay

## 2022-11-08 ENCOUNTER — Ambulatory Visit (INDEPENDENT_AMBULATORY_CARE_PROVIDER_SITE_OTHER): Payer: Medicare Other | Admitting: Family Medicine

## 2022-11-08 ENCOUNTER — Ambulatory Visit (INDEPENDENT_AMBULATORY_CARE_PROVIDER_SITE_OTHER): Payer: Medicare Other

## 2022-11-08 ENCOUNTER — Encounter: Payer: Self-pay | Admitting: Family Medicine

## 2022-11-08 ENCOUNTER — Other Ambulatory Visit (HOSPITAL_COMMUNITY): Payer: Self-pay

## 2022-11-08 VITALS — BP 114/79 | HR 99 | Temp 98.3°F | Ht 64.0 in | Wt 209.0 lb

## 2022-11-08 DIAGNOSIS — M545 Low back pain, unspecified: Secondary | ICD-10-CM

## 2022-11-08 DIAGNOSIS — R55 Syncope and collapse: Secondary | ICD-10-CM | POA: Diagnosis not present

## 2022-11-08 DIAGNOSIS — G8929 Other chronic pain: Secondary | ICD-10-CM | POA: Diagnosis not present

## 2022-11-08 DIAGNOSIS — M0579 Rheumatoid arthritis with rheumatoid factor of multiple sites without organ or systems involvement: Secondary | ICD-10-CM

## 2022-11-08 MED ORDER — LIDOCAINE 5 % EX PTCH: 1.0000 | MEDICATED_PATCH | CUTANEOUS | 99 refills | Status: DC

## 2022-11-08 NOTE — Telephone Encounter (Signed)
Pharmacy Patient Advocate Encounter   Received notification from [facility] that prior authorization for Lidocaine 5% patches is required/requested.    PA submitted on 11/08/22 to [employer] via CoverMyMeds  Key  [id number]   Status is pending

## 2022-11-08 NOTE — Progress Notes (Signed)
Subjective: CC: Recurrent syncope PCP: [name], DO IBB:[id number] Jean Howell is a 66 y.o. female presenting to clinic today for:  1.  Recurrent syncope Patient noted to have SVT runs on Zio patch.  She was referred to cardiology in [facility] and has an appointment coming up in about 3-1/2 weeks.  She did have a recurrent episode of syncope shortly after our last visit but has not had any since.  She has tapered almost completely off of the amitriptyline and is doing fine with that  2.  Back pain Patient reports chronic low back pain but it seems to be getting more difficult despite having gone through physical therapy.  She is treated with medications for rheumatoid arthritis but again that does not necessarily seem to help her back.  Has muscle relaxer on hand if needed.  Feels like she cannot straighten up totally due to the back pain.  Does not report any leg weakness but does occasionally have some numbness and tingling in her legs.   ROS: Per HPI  No Known Allergies Past Medical History:  Diagnosis Date   Arthritis    Depression    Hypertension    Rheumatoid arthritis ([place])     Current Outpatient Medications:    Calcium-Magnesium-Zinc (CAL-MAG-ZINC PO), Take 1 tablet by mouth daily., Disp: , Rfl:    docusate sodium (COLACE) 100 MG capsule, Take 1 capsule (100 mg total) by mouth daily as needed., Disp: 30 capsule, Rfl: 2   Etanercept (ENBREL MINI) 50 MG/ML SOCT, Inject 50 mg into the skin once a week., Disp: 4 mL, Rfl: 2   fluticasone (FLONASE) 50 MCG/ACT nasal spray, Place 2 sprays into both nostrils daily. (Patient taking differently: Place 2 sprays into both nostrils daily as needed (Ears stopped up).), Disp: 16 g, Rfl: 6   gabapentin (NEURONTIN) 400 MG capsule, Take 1 capsule (400 mg total) by mouth at bedtime., Disp: 90 capsule, Rfl: 3   leflunomide (ARAVA) 20 MG tablet, TAKE ONE (1) TABLET EACH DAY, Disp: 90 tablet, Rfl: 0   losartan-hydrochlorothiazide (HYZAAR) 100-12.5  MG tablet, TAKE ONE (1) TABLET EACH DAY, Disp: 90 tablet, Rfl: 3   methocarbamol (ROBAXIN-750) 750 MG tablet, Take 1 tablet (750 mg total) by mouth 2 (two) times daily as needed for muscle spasms., Disp: 20 tablet, Rfl: 2   Multiple Vitamin (MULTIVITAMIN PO), Take 1 tablet by mouth daily., Disp: , Rfl:    ondansetron (ZOFRAN) 4 MG tablet, Take 1 tablet (4 mg total) by mouth every 8 (eight) hours as needed for nausea or vomiting., Disp: 40 tablet, Rfl: 0   rosuvastatin (CRESTOR) 10 MG tablet, Take 1 tablet (10 mg total) by mouth daily., Disp: 90 tablet, Rfl: 3   Suvorexant (BELSOMRA) 10 MG TABS, Take 10 mg by mouth at bedtime as needed (sleep). Place on hold, Disp: 30 tablet, Rfl: 3   amitriptyline (ELAVIL) 10 MG tablet, Take 1 tablet (10 mg total) by mouth at bedtime for 30 days, THEN 0.5 tablets (5 mg total) at bedtime. Then take 0.5 tablet every other day for 2 weeks. Then stop.., Disp: 55 tablet, Rfl: 0 Social History   Socioeconomic History   Marital status: Widowed    Spouse name: Not on file   Number of children: 1   Years of education: 12th   Highest education level: GED or equivalent  Occupational History   Occupation: retired  Tobacco Use   Smoking status: Former    Packs/day: 1.00    Years: 15.00  Total pack years: 15.00    Types: Cigarettes    Quit date: 2000    Years since quitting: 23.9    Passive exposure: Past   Smokeless tobacco: Never  Vaping Use   Vaping Use: Never used  Substance and Sexual Activity   Alcohol use: Not Currently   Drug use: Never   Sexual activity: Not Currently    Comment: widowed  Other Topics Concern   Not on file  Social History Narrative   Patient is a widow that resides independently in [place].  She has 1 son, who resides in [place].   Social Determinants of Health   Financial Resource Strain: Low Risk  (09/20/2022)   Overall Financial Resource Strain (CARDIA)    Difficulty of Paying Living Expenses: Not hard at all   Food Insecurity: No Food Insecurity (09/20/2022)   Hunger Vital Sign    Worried About Running Out of Food in the Last Year: Never true    Ran Out of Food in the Last Year: Never true  Transportation Needs: No Transportation Needs (09/20/2022)   PRAPARE - Hydrologist (Medical): No    Lack of Transportation (Non-Medical): No  Physical Activity: Inactive (09/20/2022)   Exercise Vital Sign    Days of Exercise per Week: 0 days    Minutes of Exercise per Session: 0 min  Stress: No Stress Concern Present (09/20/2022)   [place]    Feeling of Stress : Not at all  Social Connections: Moderately Integrated (09/20/2022)   Social Connection and Isolation Panel [NHANES]    Frequency of Communication with Friends and Family: More than three times a week    Frequency of Social Gatherings with Friends and Family: More than three times a week    Attends Religious Services: More than 4 times per year    Active Member of [employer] or Organizations: Yes    Attends Archivist Meetings: More than 4 times per year    Marital Status: Widowed  Intimate Partner Violence: Not At Risk (09/20/2022)   Humiliation, Afraid, Rape, and Kick questionnaire    Fear of Current or Ex-Partner: No    Emotionally Abused: No    Physically Abused: No    Sexually Abused: No   Family History  Problem Relation Age of Onset   Cancer Father    Colon cancer Father    Thyroid disease Mother    CVA Mother 49   Thyroid disease Sister    Healthy Son    Congestive Heart Failure Sister    Hypertension Sister    Diabetes Sister    Hypertension Sister    Diabetes Sister    Hypertension Sister    Hypertension Sister    Cancer Brother        kidney     Objective: Office vital signs reviewed. BP 114/79   Pulse 99   Temp 98.3 F (36.8 C)   Ht '5\' 4"'$  (1.626 m)   Wt 209 lb (94.8 kg)   SpO2 96%   BMI 35.87 kg/m    Physical Examination:  General: Awake, alert, morbidly obese, No acute distress HEENT: sclera white, MMM Cardio: regular rate and rhythm, S1S2 heard, no murmurs appreciated Pulm: clear to auscultation bilaterally, no wheezes, rhonchi or rales; normal work of breathing on room air MSK: Hunched station.  Patient walks bending forward with a cane.  Gait is antalgic. 5/5 LE strength. Light touch sensation grossly  in tact to bilateral LEs  Assessment/ Plan: 66 y.o. female   Recurrent syncope  Chronic midline low back pain without sciatica - Plan: DG Lumbar Spine 2-3 Views, lidocaine (LIDODERM) 5 %  Rheumatoid arthritis involving multiple sites with positive rheumatoid factor (HCC) - Plan: DG Lumbar Spine 2-3 Views, lidocaine (LIDODERM) 5 %  Ongoing recurrent syncope.  Has had Zio patch which did demonstrate runs of SVT.  Keep appointment with cardiology in January.  Lidoderm patches prescribed for midline low back pain without sciatica.  Obtain plain films.  Her gait was visibly impacted by this back pain.  On multiple medications for rheumatoid arthritis so unsure as to whether or not RA is interfering with back or if this is some type of other issue.  Referral to spinal specialist pending plain film results  No orders of the defined types were placed in this encounter.  No orders of the defined types were placed in this encounter.    [name], DO [place] [phone]

## 2022-11-08 NOTE — Patient Instructions (Signed)
Amitriptyline 1/2 tablet every other day x10 days then off.

## 2022-11-09 NOTE — Telephone Encounter (Signed)
Pharmacy Patient Advocate Encounter  Prior Authorization for Lidocaine Patches 5% has been approved.    PA# [id number] Effective dates: 11/08/2022 through 12/06/2023

## 2022-11-10 ENCOUNTER — Other Ambulatory Visit: Payer: Self-pay | Admitting: Family Medicine

## 2022-11-10 DIAGNOSIS — G8929 Other chronic pain: Secondary | ICD-10-CM

## 2022-11-10 DIAGNOSIS — M5136 Other intervertebral disc degeneration, lumbar region: Secondary | ICD-10-CM

## 2022-11-16 ENCOUNTER — Ambulatory Visit: Payer: Medicare Other | Attending: Physician Assistant | Admitting: Physician Assistant

## 2022-11-16 ENCOUNTER — Encounter: Payer: Self-pay | Admitting: Physician Assistant

## 2022-11-16 VITALS — BP 117/82 | HR 80 | Resp 17 | Ht 63.0 in | Wt 210.6 lb

## 2022-11-16 DIAGNOSIS — Z79899 Other long term (current) drug therapy: Secondary | ICD-10-CM | POA: Diagnosis not present

## 2022-11-16 DIAGNOSIS — Z800 Family history of malignant neoplasm of digestive organs: Secondary | ICD-10-CM

## 2022-11-16 DIAGNOSIS — I10 Essential (primary) hypertension: Secondary | ICD-10-CM

## 2022-11-16 DIAGNOSIS — Z96651 Presence of right artificial knee joint: Secondary | ICD-10-CM | POA: Diagnosis not present

## 2022-11-16 DIAGNOSIS — Z8659 Personal history of other mental and behavioral disorders: Secondary | ICD-10-CM

## 2022-11-16 DIAGNOSIS — M0579 Rheumatoid arthritis with rheumatoid factor of multiple sites without organ or systems involvement: Secondary | ICD-10-CM | POA: Diagnosis not present

## 2022-11-16 DIAGNOSIS — Z87891 Personal history of nicotine dependence: Secondary | ICD-10-CM

## 2022-11-16 DIAGNOSIS — R768 Other specified abnormal immunological findings in serum: Secondary | ICD-10-CM

## 2022-11-16 DIAGNOSIS — R5383 Other fatigue: Secondary | ICD-10-CM

## 2022-11-16 DIAGNOSIS — Z8349 Family history of other endocrine, nutritional and metabolic diseases: Secondary | ICD-10-CM

## 2022-11-16 DIAGNOSIS — M1712 Unilateral primary osteoarthritis, left knee: Secondary | ICD-10-CM | POA: Diagnosis not present

## 2022-11-16 NOTE — Patient Instructions (Signed)
Standing Labs We placed an order today for your standing lab work.   Please have your standing labs drawn at the end of February and every 3 months   Please have your labs drawn 2 weeks prior to your appointment so that the provider can discuss your lab results at your appointment.  Please note that you may see your imaging and lab results in [facility] before we have reviewed them. We will contact you once all results are reviewed. Please allow our office up to 72 hours to thoroughly review all of the results before contacting the office for clarification of your results.  Lab hours are:   Monday through Thursday from 8:00 am -12:30 pm and 1:00 pm-5:00 pm and Friday from 8:00 am-12:00 pm.  Please be advised, all patients with office appointments requiring lab work will take precedent over walk-in lab work.   Labs are drawn by [facility]. Please bring your co-pay at the time of your lab draw.  You may receive a bill from [facility] for your lab work.  Please note if you are on Hydroxychloroquine and and an order has been placed for a Hydroxychloroquine level, you will need to have it drawn 4 hours or more after your last dose.  If you wish to have your labs drawn at another location, please call the office 24 hours in advance so we can fax the orders.  The office is located at [address] [place], [place], [place] [address] No appointment is necessary.    If you have any questions regarding directions or hours of operation,  please call [phone].   As a reminder, please drink plenty of water prior to coming for your lab work. Thanks!  If you have signs or symptoms of an infection or start antibiotics: First, call your PCP for workup of your infection. Hold your medication through the infection, until you complete your antibiotics, and until symptoms resolve if you take the following: Injectable medication (Actemra, Benlysta, Cimzia, Cosentyx, Enbrel, Humira, Kevzara, Orencia, Remicade,  Simponi, Stelara, Taltz, Tremfya) Methotrexate Leflunomide (Arava) Mycophenolate (Cellcept) Morrie Sheldon, Olumiant, or Rinvoq  Vaccines You are taking a medication(s) that can suppress your immune system.  The following immunizations are recommended: Flu annually Covid-19  Td/Tdap (tetanus, diphtheria, pertussis) every 10 years Pneumonia (Prevnar 15 then Pneumovax 23 at least 1 year apart.  Alternatively, can take Prevnar 20 without needing additional dose) Shingrix: 2 doses from 4 weeks to 6 months apart  Please check with your PCP to make sure you are up to date.

## 2022-11-17 ENCOUNTER — Telehealth: Payer: Self-pay | Admitting: Pharmacist

## 2022-11-17 NOTE — Telephone Encounter (Signed)
Submitted a Prior Authorization RENEWAL request to [facility] for ENBREL via CoverMyMeds. Will update once we receive a response.  Key: [id number]  Knox Saliva, PharmD, MPH, BCPS, CPP Clinical Pharmacist (Rheumatology and Pulmonology)

## 2022-11-18 ENCOUNTER — Ambulatory Visit (INDEPENDENT_AMBULATORY_CARE_PROVIDER_SITE_OTHER): Payer: Medicare Other | Admitting: Orthopaedic Surgery

## 2022-11-18 ENCOUNTER — Encounter: Payer: Self-pay | Admitting: Orthopaedic Surgery

## 2022-11-18 VITALS — Ht 63.0 in | Wt 210.0 lb

## 2022-11-18 DIAGNOSIS — M51369 Other intervertebral disc degeneration, lumbar region without mention of lumbar back pain or lower extremity pain: Secondary | ICD-10-CM

## 2022-11-18 DIAGNOSIS — M5136 Other intervertebral disc degeneration, lumbar region: Secondary | ICD-10-CM | POA: Diagnosis not present

## 2022-11-18 NOTE — Progress Notes (Signed)
Office Visit Note   Patient: Jean Howell           Date of Birth: Oct 07, 1956           MRN: [id number] Visit Date: 11/18/2022              Requested by: [name], DO [place],  [place] [id number] PCP: [name], DO   Assessment & Plan: Visit Diagnoses:  1. Other intervertebral disc degeneration, lumbar region     Plan: Difficult to determine if she has claudication since she has problems with her left knee.  She states she does not like needles does not want an injection in her left knee.  If she develops progressive symptoms she can call in a happy to order some physical therapy.  If this not effective then MRI imaging lumbar spine would be the neck step.  She can think about this and call if she like to proceed.  Follow-Up Instructions: No follow-ups on file.   Orders:  No orders of the defined types were placed in this encounter.  No orders of the defined types were placed in this encounter.     Procedures: No procedures performed   Clinical Data: No additional findings.   Subjective: Chief Complaint  Patient presents with   Lower Back - Pain    HPI 66 year old female with seropositive rheumatoid arthritis previous right total knee arthroplasty by Dr.Xu and some residual left knee arthritis is seen with low back pain worse in the last few months.  Has more pain on the right than left side.  Sometimes some tingling in her left leg.  She states surgery result for her right knee replacement has been good.  She is not sure whether left knee pain or her back limits her ambulation she states after about 5 minutes she has to rest.  Patient has BMI 37.  She denies associated bowel or bladder symptoms.  Back feels stiff aching sometimes radiates into her legs but not consistently.  She is not sure if her back was hurting a year ago.  Review of Systems all systems are noncontributory to HPI former smoker.   Objective: Vital Signs: Ht '5\' 3"'$  (1.6  m)   Wt 210 lb (95.3 kg)   BMI 37.20 kg/m   Physical Exam Constitutional:      Appearance: She is well-developed.  HENT:     Head: Normocephalic.     Right Ear: External ear normal.     Left Ear: External ear normal. There is no impacted cerumen.  Eyes:     Pupils: Pupils are equal, round, and reactive to light.  Neck:     Thyroid: No thyromegaly.     Trachea: No tracheal deviation.  Cardiovascular:     Rate and Rhythm: Normal rate.  Pulmonary:     Effort: Pulmonary effort is normal.  Abdominal:     Palpations: Abdomen is soft.  Musculoskeletal:     Cervical back: No rigidity.  Skin:    General: Skin is warm and dry.  Neurological:     Mental Status: She is alert and oriented to person, place, and time.  Psychiatric:        Behavior: Behavior normal.     Ortho Exam negative logroll to hips.  Knees reach full extension.  Crepitus with left knee range of motion well-healed midline incision right knee.  Some tenderness of the lumbar spine anterior tib EHL is strong.  Specialty Comments:  No specialty comments available.  Imaging: Narrative & Impression  CLINICAL DATA:  Low back pain.   EXAM: LUMBAR SPINE - 2-3 VIEW   COMPARISON:  None Available.   FINDINGS: There is no evidence of lumbar spine fracture. Alignment is normal. Moderate degenerative disc disease is noted at all levels of the lumbar spine with anterior osteophyte formation.   IMPRESSION: Moderate multilevel degenerative disc disease. No acute abnormality seen.   Aortic Atherosclerosis (ICD10-I70.0).     Electronically Signed   By: Marijo Conception M.D.   On: 11/09/2022 18:26     PMFS History: Patient Active Problem List   Diagnosis Date Noted   Other intervertebral disc degeneration, lumbar region 11/18/2022   Status post total right knee replacement 07/05/2022   Primary osteoarthritis of right knee 06/10/2022   Osteopenia of neck of right femur 03/16/2022   Full code status 06/16/2020    Impaired mobility and endurance 06/16/2020   Sciatica of left side 06/16/2020   Rheumatoid arthritis involving multiple sites with positive rheumatoid factor ([place]) 03/06/2019   Primary osteoarthritis of both knees 03/06/2019   Former smoker 03/06/2019   ANA positive 02/12/2019   Family history of thyroid disease 01/08/2019   Morbid obesity ([place]) 01/08/2019   Essential hypertension 01/08/2019   Family history of colon cancer 01/08/2019   Past Medical History:  Diagnosis Date   Arthritis    Depression    Hypertension    Rheumatoid arthritis ([place])     Family History  Problem Relation Age of Onset   Cancer Father    Colon cancer Father    Thyroid disease Mother    CVA Mother 94   Thyroid disease Sister    Healthy Son    Congestive Heart Failure Sister    Hypertension Sister    Diabetes Sister    Hypertension Sister    Diabetes Sister    Hypertension Sister    Hypertension Sister    Cancer Brother        kidney     Past Surgical History:  Procedure Laterality Date   BREAST EXCISIONAL BIOPSY Bilateral    pt unsure when- benign   BREAST SURGERY     nodules removed   COLONOSCOPY N/A 06/25/2019   Procedure: COLONOSCOPY;  Surgeon: Danie Binder, MD;  [facility]: [facility];  Service: Endoscopy;  Laterality: N/A;  1:30   MOLE REMOVAL Right 01/26/2022   foot (patient was put to sleep for procedure)   POLYPECTOMY  06/25/2019   Procedure: POLYPECTOMY;  Surgeon: Danie Binder, MD;  Location: [facility];  Service: Endoscopy;;   TOTAL KNEE ARTHROPLASTY Right 07/05/2022   Procedure: RIGHT TOTAL KNEE ARTHROPLASTY;  Surgeon: Leandrew Koyanagi, MD;  Location: [facility];  Service: Orthopedics;  Laterality: Right;   Social History   Occupational History   Occupation: retired  Tobacco Use   Smoking status: Former    Packs/day: 1.00    Years: 15.00    Total pack years: 15.00    Types: Cigarettes    Quit date: 2000    Years since quitting: 23.9    Passive exposure: Past    Smokeless tobacco: Never  Vaping Use   Vaping Use: Never used  Substance and Sexual Activity   Alcohol use: Not Currently   Drug use: Never   Sexual activity: Not Currently    Comment: widowed

## 2022-11-19 NOTE — Telephone Encounter (Signed)
Received notification from [facility] regarding a prior authorization for ENBREL. Authorization has been APPROVED from 11/17/22 to 12/06/2023. Approval letter sent to scan center.  Patient can continue to fill through [facility]: [phone]   Authorization # [id number] Phone # [phone]  Updated Kathy Breach, PharmD, MPH, BCPS, CPP Clinical Pharmacist (Rheumatology and Pulmonology)

## 2022-11-24 ENCOUNTER — Other Ambulatory Visit: Payer: Self-pay

## 2022-11-24 ENCOUNTER — Other Ambulatory Visit: Payer: Self-pay | Admitting: Rheumatology

## 2022-11-24 ENCOUNTER — Other Ambulatory Visit (HOSPITAL_COMMUNITY): Payer: Self-pay

## 2022-11-24 DIAGNOSIS — M0579 Rheumatoid arthritis with rheumatoid factor of multiple sites without organ or systems involvement: Secondary | ICD-10-CM

## 2022-11-24 MED ORDER — ENBREL MINI 50 MG/ML ~~LOC~~ SOCT
50.0000 mg | SUBCUTANEOUS | 2 refills | Status: DC
  Filled 2022-11-24: qty 4, 28d supply, fill #0
  Filled 2022-12-23: qty 4, 28d supply, fill #1
  Filled 2023-01-19: qty 4, 28d supply, fill #2

## 2022-11-24 NOTE — Telephone Encounter (Signed)
Next Visit: 02/15/2023  Last Visit: 11/16/2022  Last Fill: 07/06/2022  [id number] arthritis involving multiple sites with positive rheumatoid factor   Current Dose per office note 11/16/2022: Enbrel 50 mg sq injections once weekly   Labs: 11/03/2022 Albumin/globulin ratio is borderline low.  Total protein Wnl.  Rest of CMP WNL. RDW is borderline elevated   TB Gold: 03/01/2022 Neg    Okay to refill Enbrel?

## 2022-11-30 ENCOUNTER — Other Ambulatory Visit (HOSPITAL_COMMUNITY): Payer: Self-pay

## 2022-12-03 ENCOUNTER — Telehealth: Payer: Self-pay | Admitting: *Deleted

## 2022-12-03 NOTE — Telephone Encounter (Signed)
(  Late entry for 09/28/22)- 90 day in office appointment completed.

## 2022-12-07 ENCOUNTER — Ambulatory Visit: Payer: Medicare Other | Admitting: Internal Medicine

## 2022-12-21 ENCOUNTER — Telehealth: Payer: Self-pay | Admitting: *Deleted

## 2022-12-21 NOTE — Telephone Encounter (Signed)
Patient contacted the office about her Enbrel. Patient states her Enbrel medication ran out onto her skin. Patient states there was never a puncture of her skin. Consulted with Dr. Estanislado Pandy and she advised that patient could use her other Enbrel to take the injection today. Patient expressed understanding.

## 2022-12-23 ENCOUNTER — Other Ambulatory Visit (HOSPITAL_COMMUNITY): Payer: Self-pay

## 2022-12-23 ENCOUNTER — Other Ambulatory Visit: Payer: Self-pay

## 2022-12-23 ENCOUNTER — Telehealth: Payer: Self-pay | Admitting: Pharmacist

## 2022-12-23 NOTE — Telephone Encounter (Signed)
Fax for replacement of Enbrel Autotouch injector device sent to [employer]er/designer) today  Fax: [phone] Phone: [phone]  Knox Saliva, PharmD, MPH, BCPS, CPP Clinical Pharmacist (Rheumatology and Pulmonology)

## 2022-12-27 ENCOUNTER — Ambulatory Visit: Payer: 59 | Attending: Internal Medicine | Admitting: Internal Medicine

## 2022-12-27 ENCOUNTER — Encounter: Payer: Self-pay | Admitting: Internal Medicine

## 2022-12-27 VITALS — BP 118/84 | HR 106 | Ht 63.0 in | Wt 216.8 lb

## 2022-12-27 DIAGNOSIS — R55 Syncope and collapse: Secondary | ICD-10-CM | POA: Diagnosis not present

## 2022-12-27 DIAGNOSIS — I10 Essential (primary) hypertension: Secondary | ICD-10-CM

## 2022-12-27 DIAGNOSIS — E785 Hyperlipidemia, unspecified: Secondary | ICD-10-CM | POA: Insufficient documentation

## 2022-12-27 DIAGNOSIS — E7849 Other hyperlipidemia: Secondary | ICD-10-CM | POA: Diagnosis not present

## 2022-12-27 NOTE — Progress Notes (Signed)
Cardiology Office Note  Date: 12/27/2022   ID: Jean Howell, DOB Sep 19, 1956, MRN [id number]  PCP:  [name], DO  Cardiologist:  Chalmers Guest, MD Electrophysiologist:  None   Reason for Office Visit: Syncope evaluation at the request of Dr. Lajuana Ripple.   History of Present Illness: Jean Howell is a 67 y.o. female known to have HTN, RA was referred to cardiology clinic for evaluation of syncope.  Patient had 2 syncopal episodes approximately around July and September 2023. The first syncopal episode occurred around July 2023 at home when she woke up in the morning (probably around noon time), walking to the living room to prepare breakfast which was when she felt extremely hot in the kitchen and fell down. She regained consciousness after unknown duration and called her sister as well as paramedics. Paramedics arrived and checked her vitals which were within normal limits. The second syncopal episode happened when she went to the hospital for mammogram. She felt hot again and sat in a chair. The friend's staff called her name but patient did not respond. Her eyes were open and was making incoherent sounds. After unknown duration, she came back to senses and was coherent.  Orthostatic vitals were performed and were negative.  Patient reported that she did not have any breakfast on both occasions and also does not drink much water. She checks her blood pressure in the morning which was usually in the range of 120 to 160 mmHg SBP but did not check her blood pressure on the day she had dizzy spell. She underwent event monitor with her PCP twice and showed brief SVT episodes but no conduction abnormalities.  She did not have any recurrent dizziness/syncopal events after those episodes.  Denies any angina, DOE, further syncope, palpitations, leg swelling.  Denies smoking cigarettes, illicit drug abuse and alcohol use.  No family history of premature ASCVD.  Past Medical History:   Diagnosis Date   Arthritis    Depression    Hypertension    Rheumatoid arthritis ([place])     Past Surgical History:  Procedure Laterality Date   BREAST EXCISIONAL BIOPSY Bilateral    pt unsure when- benign   BREAST SURGERY     nodules removed   COLONOSCOPY N/A 06/25/2019   Procedure: COLONOSCOPY;  Surgeon: Danie Binder, MD;  [facility];  Service: Endoscopy;  Laterality: N/A;  1:30   MOLE REMOVAL Right 01/26/2022   foot (patient was put to sleep for procedure)   POLYPECTOMY  06/25/2019   Procedure: POLYPECTOMY;  Surgeon: Danie Binder, MD;  [facility];  Service: Endoscopy;;   TOTAL KNEE ARTHROPLASTY Right 07/05/2022   Procedure: RIGHT TOTAL KNEE ARTHROPLASTY;  Surgeon: Leandrew Koyanagi, MD;  Location: [place];  Service: Orthopedics;  Laterality: Right;    Current Outpatient Medications  Medication Sig Dispense Refill   Calcium-Magnesium-Zinc (CAL-MAG-ZINC PO) Take 1 tablet by mouth daily.     docusate sodium (COLACE) 100 MG capsule Take 1 capsule (100 mg total) by mouth daily as needed. 30 capsule 2   Etanercept (ENBREL MINI) 50 MG/ML SOCT Inject 50 mg into the skin once a week. 4 mL 2   fluticasone (FLONASE) 50 MCG/ACT nasal spray Place 2 sprays into both nostrils daily. (Patient taking differently: Place 2 sprays into both nostrils daily as needed (Ears stopped up).) 16 g 6   gabapentin (NEURONTIN) 400 MG capsule Take 1 capsule (400 mg total) by mouth at bedtime. 90 capsule 3  leflunomide (ARAVA) 20 MG tablet TAKE ONE (1) TABLET EACH DAY 90 tablet 0   lidocaine (LIDODERM) 5 % Place 1 patch onto the skin daily. Remove & Discard patch within 12 hours or as directed by MD 30 patch PRN   losartan-hydrochlorothiazide (HYZAAR) 100-12.5 MG tablet TAKE ONE (1) TABLET EACH DAY 90 tablet 3   methocarbamol (ROBAXIN-750) 750 MG tablet Take 1 tablet (750 mg total) by mouth 2 (two) times daily as needed for muscle spasms. 20 tablet 2   Multiple Vitamin (MULTIVITAMIN PO)  Take 1 tablet by mouth daily.     ondansetron (ZOFRAN) 4 MG tablet Take 1 tablet (4 mg total) by mouth every 8 (eight) hours as needed for nausea or vomiting. 40 tablet 0   rosuvastatin (CRESTOR) 10 MG tablet Take 1 tablet (10 mg total) by mouth daily. 90 tablet 3   Suvorexant (BELSOMRA) 10 MG TABS Take 10 mg by mouth at bedtime as needed (sleep). Place on hold 30 tablet 3   amitriptyline (ELAVIL) 10 MG tablet Take 1 tablet (10 mg total) by mouth at bedtime for 30 days, THEN 0.5 tablets (5 mg total) at bedtime. Then take 0.5 tablet every other day for 2 weeks. Then stop.. (Patient not taking: Reported on 11/16/2022) 55 tablet 0   No current facility-administered medications for this visit.   Allergies:  Patient has no known allergies.   Social History: The patient  reports that she quit smoking about 24 years ago. Her smoking use included cigarettes. She has a 15.00 pack-year smoking history. She has been exposed to tobacco smoke. She has never used smokeless tobacco. She reports that she does not currently use alcohol. She reports that she does not use drugs.   Family History: The patient's family history includes CVA (age of onset: 42) in her mother; Cancer in her brother and father; Colon cancer in her father; Congestive Heart Failure in her sister; Diabetes in her sister and sister; Healthy in her son; Hypertension in her sister, sister, sister, and sister; Thyroid disease in her mother and sister.   ROS:  Please see the history of present illness. Otherwise, complete review of systems is positive for none.  All other systems are reviewed and negative.   Physical Exam: VS:  BP 118/84 (BP Location: Left Arm, Cuff Size: Large)   Pulse (!) 106   Ht '5\' 3"'$  (1.6 m)   Wt 216 lb 12.8 oz (98.3 kg)   SpO2 96%   BMI 38.40 kg/m , BMI Body mass index is 38.4 kg/m.  Wt Readings from Last 3 Encounters:  12/27/22 216 lb 12.8 oz (98.3 kg)  11/18/22 210 lb (95.3 kg)  11/16/22 210 lb 9.6 oz (95.5 kg)     General: Patient appears comfortable at rest. HEENT: Conjunctiva and lids normal, oropharynx clear with moist mucosa. Neck: Supple, no elevated JVP or carotid bruits, no thyromegaly. Lungs: Clear to auscultation, nonlabored breathing at rest. Cardiac: Regular rate and rhythm, no S3 or significant systolic murmur, no pericardial rub. Abdomen: Soft, nontender, no hepatomegaly, bowel sounds present, no guarding or rebound. Extremities: No pitting edema, distal pulses 2+. Skin: Warm and dry. Musculoskeletal: No kyphosis. Neuropsychiatric: Alert and oriented x3, affect grossly appropriate.  ECG:  An ECG dated 12/27/2022 was personally reviewed today and demonstrated:  Normal sinus rhythm and no ST-T changes  Recent Labwork: 11/03/2022: ALT 15; AST 17; BUN 14; Creatinine, Ser 0.93; Hemoglobin 12.9; Platelets 341; Potassium 3.9; Sodium 140     Component Value Date/Time  CHOL 196 03/16/2022 0944   TRIG 119 03/16/2022 0944   HDL 54 03/16/2022 0944   CHOLHDL 3.6 03/16/2022 0944   LDLCALC 121 (H) 03/16/2022 0944    Other Studies Reviewed Today:   Assessment and Plan: Patient is a 67 year old F known to have HTN, RA was referred to cardiology clinic for evaluation of syncope.  # Syncope, rule out structural heart disease -Patient had 2 syncopal episodes around July and September 2023 which are likely secondary to dehydration/hypoglycemia.  She did not have any recurrent syncopal episodes after September 2023. Event monitor from 11/2022 did not show any evidence of conduction abnormalities to explain syncope. It did show brief SVT episodes and 1 episode of SVT with aberrancy (not VT) and patient was asymptomatic during that time. No indication of AV nodal agents at this time. I will obtain 2D echocardiogram to rule out structural heart disease.  # HTN, partially controlled -Patient has controlled blood pressures in the office but slightly elevated at pressures at home, likely from mast HTN.  Continue losartan-HCTZ 100-12.5 mg once daily. Instructed patient to check blood pressures twice in the day, a.m. and p.m.  If in the future she has dizziness, blood pressure and heart rate needs to be checked.  # HLD -Continue rosuvastatin 10 mg nightly -Management of HLD per PCP  I have spent a total of 45 minutes with patient reviewing chart, EKGs, labs and examining patient as well as establishing an assessment and plan that was discussed with the patient.  > 50% of time was spent in direct patient care.     Medication Adjustments/Labs and Tests Ordered: Current medicines are reviewed at length with the patient today.  Concerns regarding medicines are outlined above.   Tests Ordered: Orders Placed This Encounter  Procedures   EKG 12-Lead    Medication Changes: No orders of the defined types were placed in this encounter.   Disposition:  Follow up  one year  Signed Kodie Pick Fidel Levy, MD, 12/27/2022 10:14 AM    [facility] [place], [place], [place] [id number]

## 2022-12-27 NOTE — Patient Instructions (Signed)
Medication Instructions:  Your physician recommends that you continue on your current medications as directed. Please refer to the Current Medication list given to you today.  Labwork: none  Testing/Procedures: Your physician has requested that you have an echocardiogram. Echocardiography is a painless test that uses sound waves to create images of your heart. It provides your doctor with information about the size and shape of your heart and how well your heart's chambers and valves are working. This procedure takes approximately one hour. There are no restrictions for this procedure. Please do NOT wear cologne, perfume, aftershave, or lotions (deodorant is allowed). Please arrive 15 minutes prior to your appointment time.  Follow-Up: Your physician recommends that you schedule a follow-up appointment in: 1 year. You will receive a reminder call in the mail in about 10 months reminding you to call and schedule your appointment. If you don't receive this call, please contact our office.  Any Other Special Instructions Will Be Listed Below (If Applicable).  If you need a refill on your cardiac medications before your next appointment, please call your pharmacy.

## 2022-12-28 ENCOUNTER — Encounter: Payer: Self-pay | Admitting: Orthopaedic Surgery

## 2022-12-28 ENCOUNTER — Ambulatory Visit (INDEPENDENT_AMBULATORY_CARE_PROVIDER_SITE_OTHER): Payer: 59

## 2022-12-28 ENCOUNTER — Ambulatory Visit (INDEPENDENT_AMBULATORY_CARE_PROVIDER_SITE_OTHER): Payer: 59 | Admitting: Orthopaedic Surgery

## 2022-12-28 DIAGNOSIS — Z96651 Presence of right artificial knee joint: Secondary | ICD-10-CM

## 2022-12-28 NOTE — Progress Notes (Signed)
Office Visit Note   Patient: Jean Howell           Date of Birth: [birth year]           MRN: [id number] Visit Date: 12/28/2022              Requested by: [name], DO [place],  [place] [id number] PCP: [name], DO   Assessment & Plan: Visit Diagnoses:  1. Status post total right knee replacement     Plan: [name] is doing well overall from the surgery.  I think her symptoms stem from patella maltracking due to quadriceps weakness.  She will work on this at home.  Dental prophylaxis recheck in 6 months with two-view x-rays of right knee.  Follow-Up Instructions: No follow-ups on file.   Orders:  Orders Placed This Encounter  Procedures   XR Knee 1-2 Views Right   No orders of the defined types were placed in this encounter.     Procedures: No procedures performed   Clinical Data: No additional findings.   Subjective: Chief Complaint  Patient presents with   Right Knee - Follow-up    Right total knee arthroplasty 07/05/2022    HPI Jean Howell is a 75-month[age] post right total knee replacement on 07/05/2022.  Overall doing well.  Feels little stiffness in the morning and occasionally feels like the kneecap slips upon getting up. Review of Systems   Objective: Vital Signs: There were no vitals taken for this visit.  Physical Exam  Ortho Exam Examination of right knee shows atrophy of the quadriceps.  Fully healed surgical scar.  Good varus valgus stability. Specialty Comments:  No specialty comments available.  Imaging: XR Knee 1-2 Views Right  Result Date: 12/28/2022 Stable total knee replacement in good alignment     PMFS History: Patient Active Problem List   Diagnosis Date Noted   Syncope and collapse 12/27/2022   HLD (hyperlipidemia) 12/27/2022   Other intervertebral disc degeneration, lumbar region 11/18/2022   Status post total right knee replacement 07/05/2022   Primary osteoarthritis of right knee 06/10/2022    Osteopenia of neck of right femur 03/16/2022   Full code status 06/16/2020   Impaired mobility and endurance 06/16/2020   Sciatica of left side 06/16/2020   Rheumatoid arthritis involving multiple sites with positive rheumatoid factor ([place] 03/06/2019   Primary osteoarthritis of both knees 03/06/2019   Former smoker 03/06/2019   ANA positive 02/12/2019   Family history of thyroid disease 01/08/2019   Morbid obesity ([place] 01/08/2019   Essential hypertension 01/08/2019   Family history of colon cancer 01/08/2019   Past Medical History:  Diagnosis Date   Arthritis    Depression    Hypertension    Rheumatoid arthritis ([place]     Family History  Problem Relation Age of Onset   Cancer Father    Colon cancer Father    Thyroid disease Mother    CVA Mother 628  Thyroid disease Sister    Healthy Son    Congestive Heart Failure Sister    Hypertension Sister    Diabetes Sister    Hypertension Sister    Diabetes Sister    Hypertension Sister    Hypertension Sister    Cancer Brother        kidney     Past Surgical History:  Procedure Laterality Date   BREAST EXCISIONAL BIOPSY Bilateral    pt unsure when- benign   BREAST SURGERY  nodules removed   COLONOSCOPY N/A 06/25/2019   Procedure: COLONOSCOPY;  Surgeon: Danie Binder, MD;  [facility];  Service: Endoscopy;  Laterality: N/A;  1:30   MOLE REMOVAL Right 01/26/2022   foot (patient was put to sleep for procedure)   POLYPECTOMY  06/25/2019   Procedure: POLYPECTOMY;  Surgeon: Danie Binder, MD;  [facility];  Service: Endoscopy;;   TOTAL KNEE ARTHROPLASTY Right 07/05/2022   Procedure: RIGHT TOTAL KNEE ARTHROPLASTY;  Surgeon: Leandrew Koyanagi, MD;  Location: [place];  Service: Orthopedics;  Laterality: Right;   Social History   Occupational History   Occupation: retired  Tobacco Use   Smoking status: Former    Packs/day: 1.00    Years: 15.00    Total pack years: 15.00    Types: Cigarettes     Quit date: 2000    Years since quitting: 24.0    Passive exposure: Past   Smokeless tobacco: Never  Vaping Use   Vaping Use: Never used  Substance and Sexual Activity   Alcohol use: Not Currently   Drug use: Never   Sexual activity: Not Currently    Comment: widowed

## 2023-01-07 ENCOUNTER — Telehealth: Payer: Self-pay | Admitting: Orthopaedic Surgery

## 2023-01-07 NOTE — Telephone Encounter (Signed)
Appt for 07/23 needs to be rescheduled provider not going to be in office called vm box full 01/07/23

## 2023-01-13 ENCOUNTER — Ambulatory Visit: Payer: 59 | Attending: Internal Medicine

## 2023-01-13 DIAGNOSIS — R55 Syncope and collapse: Secondary | ICD-10-CM | POA: Diagnosis not present

## 2023-01-13 LAB — ECHOCARDIOGRAM COMPLETE
AR max vel: 2.82 cm2
AV Peak grad: 6.1 mmHg
AV Vena cont: 0.2 cm
Ao pk vel: 1.23 m/s
Area-P 1/2: 4.1 cm2
Calc EF: 57.8 %
MV M vel: 1.89 m/s
MV Peak grad: 14.3 mmHg
P 1/2 time: 1075 msec
S' Lateral: 3.6 cm
Single Plane A2C EF: 60 %
Single Plane A4C EF: 59.2 %

## 2023-01-19 ENCOUNTER — Other Ambulatory Visit: Payer: Self-pay

## 2023-01-19 ENCOUNTER — Other Ambulatory Visit (HOSPITAL_COMMUNITY): Payer: Self-pay

## 2023-02-01 NOTE — Progress Notes (Signed)
Office Visit Note  Patient: Jean Howell             Date of Birth: 1956/03/25           MRN: [id number]             PCP: [name], DO Referring: [name], DO Visit Date: 02/15/2023 Occupation: '@GUAROCC'$ @  Subjective:  Medication monitoring  History of Present Illness: Jean Howell is a 67 y.o. female with history of seropositive rheumatoid arthritis and osteoarthritis.  Patient is on Enbrel 50 mg sq injections once weekly and Arava 20 mg 1 tablet by mouth daily.  She continues to tolerate combination therapy without any side effects.  She denies missing any doses recently.  She continues to have intermittent discomfort in both wrist joints, both ankle joints, and the left knee joint.  She has been wearing compression gloves on her hands which have been helpful.  Overall she feels that her rheumatoid arthritis remains stable on the current treatment regimen.  She denies any new medical conditions.  She has not had any recent or recurrent infections.     Activities of Daily Living:  Patient reports joint stiffness all day   Patient Denies nocturnal pain.  Difficulty dressing/grooming: Denies Difficulty climbing stairs: Reports Difficulty getting out of chair: Reports Difficulty using hands for taps, buttons, cutlery, and/or writing: Reports  Review of Systems  Constitutional:  Positive for fatigue.  HENT:  Negative for mouth sores, mouth dryness and nose dryness.   Eyes:  Negative for pain, visual disturbance and dryness.  Respiratory:  Negative for cough, hemoptysis, shortness of breath and difficulty breathing.   Cardiovascular:  Negative for chest pain, palpitations, hypertension and swelling in legs/feet.  Gastrointestinal:  Negative for blood in stool, constipation and diarrhea.  Endocrine: Negative for increased urination.  Genitourinary:  Negative for painful urination.  Musculoskeletal:  Positive for joint pain, joint pain and morning stiffness.  Negative for joint swelling, myalgias, muscle weakness, muscle tenderness and myalgias.  Skin:  Positive for rash. Negative for color change, pallor, hair loss, nodules/bumps, skin tightness, ulcers and sensitivity to sunlight.  Allergic/Immunologic: Negative for susceptible to infections.  Neurological:  Negative for dizziness, numbness, headaches and weakness.  Hematological:  Negative for swollen glands.  Psychiatric/Behavioral:  Negative for depressed mood and sleep disturbance. The patient is not nervous/anxious.     PMFS History:  Patient Active Problem List   Diagnosis Date Noted   Pigmented skin lesion of uncertain nature [id number]   Syncope and collapse 12/27/2022   HLD (hyperlipidemia) 12/27/2022   Other intervertebral disc degeneration, lumbar region 11/18/2022   Status post total right knee replacement 07/05/2022   Primary osteoarthritis of right knee 06/10/2022   Osteopenia of neck of right femur 03/16/2022   Full code status 06/16/2020   Impaired mobility and endurance 06/16/2020   Sciatica of left side 06/16/2020   Rheumatoid arthritis involving multiple sites with positive rheumatoid factor ([place]) 03/06/2019   Primary osteoarthritis of both knees 03/06/2019   Former smoker 03/06/2019   ANA positive 02/12/2019   Family history of thyroid disease 01/08/2019   Morbid obesity ([place]) 01/08/2019   Essential hypertension 01/08/2019   Family history of colon cancer 01/08/2019    Past Medical History:  Diagnosis Date   Arthritis    Depression    Hypertension    Rheumatoid arthritis ([place])     Family History  Problem Relation Age of Onset   Cancer Father    Colon cancer  Father    Thyroid disease Mother    CVA Mother 62   Thyroid disease Sister    Healthy Son    Congestive Heart Failure Sister    Hypertension Sister    Diabetes Sister    Hypertension Sister    Diabetes Sister    Hypertension Sister    Hypertension Sister    Cancer Brother        kidney     Past Surgical History:  Procedure Laterality Date   BREAST EXCISIONAL BIOPSY Bilateral    pt unsure when- benign   BREAST SURGERY     nodules removed   COLONOSCOPY N/A 06/25/2019   Procedure: COLONOSCOPY;  Surgeon: Danie Binder, MD;  [facility];  Service: Endoscopy;  Laterality: N/A;  1:30   MOLE REMOVAL Right 01/26/2022   foot (patient was put to sleep for procedure)   POLYPECTOMY  06/25/2019   Procedure: POLYPECTOMY;  Surgeon: Danie Binder, MD;  [facility]: [facility];  Service: Endoscopy;;   TOTAL KNEE ARTHROPLASTY Right 07/05/2022   Procedure: RIGHT TOTAL KNEE ARTHROPLASTY;  Surgeon: Leandrew Koyanagi, MD;  Location: [facility];  Service: Orthopedics;  Laterality: Right;   Social History   Social History Narrative   Patient is a widow that resides independently in [place].  She has 1 son, who resides in [place].   Immunization History  Administered Date(s) Administered   Fluad Quad(high Dose 65+) 09/16/2020, 09/07/2022   Influenza, Quadrivalent, Recombinant, Inj, Pf 09/19/2019   PFIZER(Purple Top)SARS-COV-2 Vaccination 05/23/2020, 06/13/2020, 07/31/2020, 02/28/2021   PNEUMOCOCCAL CONJUGATE-20 07/07/2022     Objective: Vital Signs: BP 126/84 (BP Location: Left Arm, Patient Position: Sitting, Cuff Size: Large)   Pulse 77   Resp 12   Ht '5\' 3"'$  (1.6 m)   Wt 223 lb (101.2 kg)   BMI 39.50 kg/m    Physical Exam Vitals and nursing note reviewed.  Constitutional:      Appearance: She is well-developed.  HENT:     Head: Normocephalic and atraumatic.  Eyes:     Conjunctiva/sclera: Conjunctivae normal.  Cardiovascular:     Rate and Rhythm: Normal rate and regular rhythm.     Heart sounds: Normal heart sounds.  Pulmonary:     Effort: Pulmonary effort is normal.     Breath sounds: Normal breath sounds.  Abdominal:     General: Bowel sounds are normal.     Palpations: Abdomen is soft.  Musculoskeletal:     Cervical back: Normal range of motion.   Lymphadenopathy:     Cervical: No cervical adenopathy.  Skin:    General: Skin is warm and dry.     Capillary Refill: Capillary refill takes less than 2 seconds.  Neurological:     Mental Status: She is alert and oriented to person, place, and time.  Psychiatric:        Behavior: Behavior normal.      Musculoskeletal Exam: C-spine has good range of motion.  Shoulder joints have good range of motion with no discomfort.  Elbow joints have good range of motion with no tenderness or inflammation.  Some tenderness over both wrist joints but no synovitis noted.  Complete fist formation noted bilaterally.  No tenderness or synovitis over MCP joints.  Hip joints have slightly limited range of motion but no groin pain currently.  Right knee replacement has good range of motion with no warmth or effusion.  Left knee joint has some discomfort but no warmth or effusion.  Ankle joints have slightly limited ROM but no synovitis was noted.  No tenderness or synovitis of MTP joints.   CDAI Exam: CDAI Score: -- Patient Global: 3 mm; Provider Global: 3 mm Swollen: --; Tender: -- Joint Exam 02/15/2023   No joint exam has been documented for this visit   There is currently no information documented on the homunculus. Go to the Rheumatology activity and complete the homunculus joint exam.  Investigation: No additional findings.  Imaging: No results found.  Recent Labs: Lab Results  Component Value Date   WBC 4.3 02/08/2023   HGB 13.0 02/08/2023   PLT 269 02/08/2023   NA 141 02/08/2023   K 4.2 02/08/2023   CL 101 02/08/2023   CO2 26 02/08/2023   GLUCOSE 96 02/08/2023   BUN 15 02/08/2023   CREATININE 1.03 (H) 02/08/2023   BILITOT 0.3 02/08/2023   ALKPHOS 69 02/08/2023   AST 23 02/08/2023   ALT 16 02/08/2023   PROT 7.5 02/08/2023   ALBUMIN 3.7 (L) 02/08/2023   CALCIUM 9.4 02/08/2023   GFRAA 88 01/07/2021   QFTBGOLDPLUS NEGATIVE 03/01/2022    Speciality Comments: PLQ Eye Exam:  12/16/2018 WNL @ Dr Anthony Sar Follow up in 1 year  Procedures:  No procedures performed Allergies: Patient has no known allergies.   Assessment / Plan:     Visit Diagnoses: Rheumatoid arthritis involving multiple sites with positive rheumatoid factor (HCC) - Positive RF, +14-3-3 eta, Positive ANA, erosive inflammatory: She has no synovitis on examination today.  She continues to experience intermittent discomfort in both wrist joints, left knee joint, and both ankle joints.  She had no active inflammation on examination today.  She remains on Enbrel 50 mg subcutaneous injections once weekly and Arava 20 mg 1 tablet by mouth daily.  She has been tolerating combination therapy without any side effects or injection site reactions from Enbrel.  She has not had any recent or recurrent infections.  She continues to experience joint stiffness lasting all day.  Overall she continues to find her arthritis to be tolerable with the use of combination therapy.  No medication changes will be made at this time.  She was advised to notify us if she develops signs or symptoms of a flare.  She will follow-up in the office in 5 months or sooner if needed.  High risk medication use - Enbrel 50 mg sq injections once weekly and Arava 20 mg 1 tablet by mouth daily. CBC and CMP updated on 02/08/23. Her next lab work will be due tin June and every 3 months.  Standing orders for CBC and CMP remain in place. TB gold negative on 03/01/22.  Order for TB gold released today. No recent or recurrent infections.  Discussed the importance of holding enbrel and arava if she develops signs or symptoms of an infection and to resume once the infection has completely cleared.   Screening for tuberculosis -Order for TB gold released today.  Plan: QuantiFERON-TB Gold Plus  Primary osteoarthritis of left knee: She continues to experience intermittent discomfort in the left knee joint.  She has no warmth or effusion on examination today.  S/P TKR  (total knee replacement), right - - Performed by Dr. Erlinda Hong on 07/05/22. Doing well.  ANA positive - No clinical features of systemic lupus at this time.  Other fatigue: Stable.  Other medical conditions are listed as follows:  Essential hypertension: Blood pressure was 126/84 today in the office.  History of depression  Former smoker  Family history of colon cancer  Family history of thyroid disease  History of immunosuppression therapy -Order for TB gold released today.  Plan: QuantiFERON-TB Gold Plus  Orders: Orders Placed This Encounter  Procedures   QuantiFERON-TB Gold Plus   Meds ordered this encounter  Medications   leflunomide (ARAVA) 20 MG tablet    Sig: Take 1 tablet by mouth daily.    Dispense:  90 tablet    Refill:  0      Follow-Up Instructions: Return in about 5 months (around 07/18/2023) for Rheumatoid arthritis, Osteoarthritis.   Ofilia Neas, PA-C  Note - This record has been created using Dragon software.  Chart creation errors have been sought, but may not always  have been located. Such creation errors do not reflect on  the standard of medical care.

## 2023-02-08 ENCOUNTER — Ambulatory Visit (INDEPENDENT_AMBULATORY_CARE_PROVIDER_SITE_OTHER): Payer: 59 | Admitting: Family Medicine

## 2023-02-08 ENCOUNTER — Encounter: Payer: Self-pay | Admitting: Family Medicine

## 2023-02-08 VITALS — BP 118/80 | HR 73 | Temp 98.7°F | Ht 63.0 in | Wt 222.8 lb

## 2023-02-08 DIAGNOSIS — Z79899 Other long term (current) drug therapy: Secondary | ICD-10-CM

## 2023-02-08 DIAGNOSIS — L819 Disorder of pigmentation, unspecified: Secondary | ICD-10-CM

## 2023-02-08 DIAGNOSIS — R21 Rash and other nonspecific skin eruption: Secondary | ICD-10-CM | POA: Diagnosis not present

## 2023-02-08 DIAGNOSIS — E7800 Pure hypercholesterolemia, unspecified: Secondary | ICD-10-CM

## 2023-02-08 MED ORDER — ROSUVASTATIN CALCIUM 10 MG PO TABS: 10.0000 mg | ORAL_TABLET | Freq: Every day | ORAL | 3 refills | Status: DC

## 2023-02-08 MED ORDER — KETOCONAZOLE 2 % EX SHAM: MEDICATED_SHAMPOO | CUTANEOUS | 2 refills | Status: DC

## 2023-02-08 MED ORDER — DOXYCYCLINE HYCLATE 100 MG PO TABS: 100.0000 mg | ORAL_TABLET | Freq: Two times a day (BID) | ORAL | 0 refills | Status: AC

## 2023-02-08 MED ORDER — LEVOCETIRIZINE DIHYDROCHLORIDE 5 MG PO TABS: 5.0000 mg | ORAL_TABLET | Freq: Every evening | ORAL | 3 refills | Status: DC | PRN

## 2023-02-08 NOTE — Progress Notes (Signed)
Subjective: [id number] PCP: [name], DO [id number] Jean Howell is a 67 y.o. female presenting to clinic today for:  1. Rash Patient reports that she has been experiencing a rash on her forearms, back and chest for several weeks now.  The rash is diffusely itchy.  She denies any new shampoos, detergents, body washes, lotions or other exposures.  She was advised by her rheumatologist to be mindful of rashes but she was not sure if this was a true drug eruption or something else.  Denies any interdigital itching or lesions.  No systemic signs or symptoms.   ROS: Per HPI  No Known Allergies Past Medical History:  Diagnosis Date   Arthritis    Depression    Hypertension    Rheumatoid arthritis ([place])     Current Outpatient Medications:    Calcium-Magnesium-Zinc (CAL-MAG-ZINC PO), Take 1 tablet by mouth daily., Disp: , Rfl:    docusate sodium (COLACE) 100 MG capsule, Take 1 capsule (100 mg total) by mouth daily as needed., Disp: 30 capsule, Rfl: 2   Etanercept (ENBREL MINI) 50 MG/ML SOCT, Inject 50 mg into the skin once a week., Disp: 4 mL, Rfl: 2   fluticasone (FLONASE) 50 MCG/ACT nasal spray, Place 2 sprays into both nostrils daily. (Patient taking differently: Place 2 sprays into both nostrils daily as needed (Ears stopped up).), Disp: 16 g, Rfl: 6   gabapentin (NEURONTIN) 400 MG capsule, Take 1 capsule (400 mg total) by mouth at bedtime., Disp: 90 capsule, Rfl: 3   leflunomide (ARAVA) 20 MG tablet, TAKE ONE (1) TABLET EACH DAY, Disp: 90 tablet, Rfl: 0   lidocaine (LIDODERM) 5 %, Place 1 patch onto the skin daily. Remove & Discard patch within 12 hours or as directed by MD, Disp: 30 patch, Rfl: PRN   losartan-hydrochlorothiazide (HYZAAR) 100-12.5 MG tablet, TAKE ONE (1) TABLET EACH DAY, Disp: 90 tablet, Rfl: 3   methocarbamol (ROBAXIN-750) 750 MG tablet, Take 1 tablet (750 mg total) by mouth 2 (two) times daily as needed for muscle spasms., Disp: 20 tablet, Rfl: 2   Multiple  Vitamin (MULTIVITAMIN PO), Take 1 tablet by mouth daily., Disp: , Rfl:    ondansetron (ZOFRAN) 4 MG tablet, Take 1 tablet (4 mg total) by mouth every 8 (eight) hours as needed for nausea or vomiting., Disp: 40 tablet, Rfl: 0   rosuvastatin (CRESTOR) 10 MG tablet, Take 1 tablet (10 mg total) by mouth daily., Disp: 90 tablet, Rfl: 3   Suvorexant (BELSOMRA) 10 MG TABS, Take 10 mg by mouth at bedtime as needed (sleep). Place on hold, Disp: 30 tablet, Rfl: 3 Social History   Socioeconomic History   Marital status: Widowed    Spouse name: Not on file   Number of children: 1   Years of education: 12th   Highest education level: GED or equivalent  Occupational History   Occupation: retired  Tobacco Use   Smoking status: Former    Packs/day: 1.00    Years: 15.00    Total pack years: 15.00    Types: Cigarettes    Quit date: 2000    Years since quitting: 24.1    Passive exposure: Past   Smokeless tobacco: Never  Vaping Use   Vaping Use: Never used  Substance and Sexual Activity   Alcohol use: Not Currently   Drug use: Never   Sexual activity: Not Currently    Comment: widowed  Other Topics Concern   Not on file  Social History Narrative   Patient is  a widow that resides independently in [place].  She has 1 son, who resides in [place].   Social Determinants of Health   Financial Resource Strain: Low Risk  (09/20/2022)   Overall Financial Resource Strain (CARDIA)    Difficulty of Paying Living Expenses: Not hard at all  Food Insecurity: No Food Insecurity (09/20/2022)   Hunger Vital Sign    Worried About Running Out of Food in the Last Year: Never true    Ran Out of Food in the Last Year: Never true  Transportation Needs: No Transportation Needs (09/20/2022)   PRAPARE - Hydrologist (Medical): No    Lack of Transportation (Non-Medical): No  Physical Activity: Inactive (09/20/2022)   Exercise Vital Sign    Days of Exercise per Week: 0 days     Minutes of Exercise per Session: 0 min  Stress: No Stress Concern Present (09/20/2022)   [place]    Feeling of Stress : Not at all  Social Connections: Moderately Integrated (09/20/2022)   Social Connection and Isolation Panel [NHANES]    Frequency of Communication with Friends and Family: More than three times a week    Frequency of Social Gatherings with Friends and Family: More than three times a week    Attends Religious Services: More than 4 times per year    Active Member of [employer] or Organizations: Yes    Attends Archivist Meetings: More than 4 times per year    Marital Status: Widowed  Intimate Partner Violence: Not At Risk (09/20/2022)   Humiliation, Afraid, Rape, and Kick questionnaire    Fear of Current or Ex-Partner: No    Emotionally Abused: No    Physically Abused: No    Sexually Abused: No   Family History  Problem Relation Age of Onset   Cancer Father    Colon cancer Father    Thyroid disease Mother    CVA Mother 79   Thyroid disease Sister    Healthy Son    Congestive Heart Failure Sister    Hypertension Sister    Diabetes Sister    Hypertension Sister    Diabetes Sister    Hypertension Sister    Hypertension Sister    Cancer Brother        kidney     Objective: Office vital signs reviewed. BP 118/80   Pulse 73   Temp 98.7 F (37.1 C)   Ht '5\' 3"'$  (1.6 m)   Wt 222 lb 12.8 oz (101.1 kg)   SpO2 97%   BMI 39.47 kg/m   Physical Examination:  General: Awake, alert, well nourished, No acute distress HEENT: sclera white, MMM Cardio: regular rate and rhythm, S1S2 heard, no murmurs appreciated Pulm: clear to auscultation bilaterally, no wheezes, rhonchi or rales; normal work of breathing on room air Skin: Several areas of postinflammatory hyperpigmentation.  She does not have any active pustules or vesicles but several areas of excoriation.  See photos below.  Additionally left  forearm with a pigmented skin lesion that is approximately 1.5 x 1.5 mm in size         Assessment/ Plan: 67 y.o. female   Rash and nonspecific skin eruption - Plan: doxycycline (VIBRA-TABS) 100 MG tablet, ketoconazole (NIZORAL) 2 % shampoo, levocetirizine (XYZAL) 5 MG tablet  Pure hypercholesterolemia - Plan: rosuvastatin (CRESTOR) 10 MG tablet  Pigmented skin lesion of uncertain nature  High risk medication use - Plan: CBC with  Differential/Platelet, CMP14+EGFR  I do not totally suspect this to be fungal but I am going to empirically treat her with a topical wash.  I would like to treat her with oral antibiotics as well to cover for bacterial etiology.  Xyzal given for itching.  I will CC this note to her rheumatologist.  Again I do not think it looks like a drug eruption but I am not very familiar with what the rashes that would result from her rheumatologic medications would look like so I would like her to take another look.  She has close follow-up with that specialist in about a week.  Cholesterol meds renewed.  Very reluctant to go to dermatology.  We will reassess her in the next couple of weeks to ensure resolution.  She will contact me if this worsens prior to that time.  No orders of the defined types were placed in this encounter.  No orders of the defined types were placed in this encounter.    [name], DO [place] [phone]

## 2023-02-08 NOTE — Addendum Note (Signed)
Addended by: Carole Binning on: 02/08/2023 01:43 PM   Modules accepted: Orders

## 2023-02-09 LAB — CMP14+EGFR
ALT: 16 IU/L (ref 0–32)
AST: 23 IU/L (ref 0–40)
Albumin/Globulin Ratio: 1 — ABNORMAL LOW (ref 1.2–2.2)
Albumin: 3.7 g/dL — ABNORMAL LOW (ref 3.9–4.9)
Alkaline Phosphatase: 69 IU/L (ref 44–121)
BUN/Creatinine Ratio: 15 (ref 12–28)
BUN: 15 mg/dL (ref 8–27)
Bilirubin Total: 0.3 mg/dL (ref 0.0–1.2)
CO2: 26 mmol/L (ref 20–29)
Calcium: 9.4 mg/dL (ref 8.7–10.3)
Chloride: 101 mmol/L (ref 96–106)
Creatinine, Ser: 1.03 mg/dL — ABNORMAL HIGH (ref 0.57–1.00)
Globulin, Total: 3.8 g/dL (ref 1.5–4.5)
Glucose: 96 mg/dL (ref 70–99)
Potassium: 4.2 mmol/L (ref 3.5–5.2)
Sodium: 141 mmol/L (ref 134–144)
Total Protein: 7.5 g/dL (ref 6.0–8.5)
eGFR: 59 mL/min/{1.73_m2} — ABNORMAL LOW (ref >59)

## 2023-02-09 LAB — CBC WITH DIFFERENTIAL/PLATELET
Basophils Absolute: 0 10*3/uL (ref 0.0–0.2)
Basos: 0 %
EOS (ABSOLUTE): 0.1 10*3/uL (ref 0.0–0.4)
Eos: 3 %
Hematocrit: 39.7 % (ref 34.0–46.6)
Hemoglobin: 13 g/dL (ref 11.1–15.9)
Immature Grans (Abs): 0 10*3/uL (ref 0.0–0.1)
Immature Granulocytes: 0 %
Lymphocytes Absolute: 1.7 10*3/uL (ref 0.7–3.1)
Lymphs: 39 %
MCH: 28.8 pg (ref 26.6–33.0)
MCHC: 32.7 g/dL (ref 31.5–35.7)
MCV: 88 fL (ref 79–97)
Monocytes Absolute: 0.2 10*3/uL (ref 0.1–0.9)
Monocytes: 5 %
Neutrophils Absolute: 2.3 10*3/uL (ref 1.4–7.0)
Neutrophils: 53 %
Platelets: 269 10*3/uL (ref 150–450)
RBC: 4.51 x10E6/uL (ref 3.77–5.28)
RDW: 14.9 % (ref 11.7–15.4)
WBC: 4.3 10*3/uL (ref 3.4–10.8)

## 2023-02-09 NOTE — Progress Notes (Signed)
CBC is normal.  Creatinine is mildly elevated and is stable.

## 2023-02-10 ENCOUNTER — Other Ambulatory Visit: Payer: Self-pay | Admitting: Physician Assistant

## 2023-02-10 ENCOUNTER — Other Ambulatory Visit (HOSPITAL_COMMUNITY): Payer: Self-pay

## 2023-02-10 DIAGNOSIS — M0579 Rheumatoid arthritis with rheumatoid factor of multiple sites without organ or systems involvement: Secondary | ICD-10-CM

## 2023-02-10 NOTE — Telephone Encounter (Signed)
Next Visit: 02/15/2023  Last Visit: 11/16/2022  Last Fill: 11/24/2022  DX: Rheumatoid arthritis involving multiple sites with positive rheumatoid factor   Current Dose per office note 11/16/2022: Enbrel 50 mg sq injections once weekly   Labs: 02/08/2023 CBC is normal.  Creatinine is mildly elevated and is stable.   TB Gold: 03/01/2022  Negative  Okay to refill Enbrel?

## 2023-02-11 ENCOUNTER — Other Ambulatory Visit (HOSPITAL_COMMUNITY): Payer: Self-pay

## 2023-02-11 ENCOUNTER — Other Ambulatory Visit: Payer: Self-pay

## 2023-02-11 MED ORDER — ENBREL MINI 50 MG/ML ~~LOC~~ SOCT
50.0000 mg | SUBCUTANEOUS | 2 refills | Status: DC
  Filled 2023-02-11: qty 4, 28d supply, fill #0
  Filled 2023-03-14: qty 4, 28d supply, fill #1
  Filled 2023-04-07: qty 4, 28d supply, fill #2

## 2023-02-15 ENCOUNTER — Encounter: Payer: Self-pay | Admitting: Physician Assistant

## 2023-02-15 ENCOUNTER — Ambulatory Visit: Payer: 59 | Attending: Physician Assistant | Admitting: Physician Assistant

## 2023-02-15 ENCOUNTER — Other Ambulatory Visit (HOSPITAL_COMMUNITY): Payer: Self-pay

## 2023-02-15 VITALS — BP 126/84 | HR 77 | Resp 12 | Ht 63.0 in | Wt 223.0 lb

## 2023-02-15 DIAGNOSIS — M0579 Rheumatoid arthritis with rheumatoid factor of multiple sites without organ or systems involvement: Secondary | ICD-10-CM | POA: Diagnosis not present

## 2023-02-15 DIAGNOSIS — Z87891 Personal history of nicotine dependence: Secondary | ICD-10-CM

## 2023-02-15 DIAGNOSIS — I10 Essential (primary) hypertension: Secondary | ICD-10-CM | POA: Diagnosis not present

## 2023-02-15 DIAGNOSIS — R768 Other specified abnormal immunological findings in serum: Secondary | ICD-10-CM

## 2023-02-15 DIAGNOSIS — Z79899 Other long term (current) drug therapy: Secondary | ICD-10-CM

## 2023-02-15 DIAGNOSIS — Z8349 Family history of other endocrine, nutritional and metabolic diseases: Secondary | ICD-10-CM | POA: Diagnosis not present

## 2023-02-15 DIAGNOSIS — R5383 Other fatigue: Secondary | ICD-10-CM

## 2023-02-15 DIAGNOSIS — Z111 Encounter for screening for respiratory tuberculosis: Secondary | ICD-10-CM

## 2023-02-15 DIAGNOSIS — Z8659 Personal history of other mental and behavioral disorders: Secondary | ICD-10-CM

## 2023-02-15 DIAGNOSIS — Z96651 Presence of right artificial knee joint: Secondary | ICD-10-CM

## 2023-02-15 DIAGNOSIS — Z800 Family history of malignant neoplasm of digestive organs: Secondary | ICD-10-CM

## 2023-02-15 DIAGNOSIS — M1712 Unilateral primary osteoarthritis, left knee: Secondary | ICD-10-CM | POA: Diagnosis not present

## 2023-02-15 DIAGNOSIS — Z9225 Personal history of immunosupression therapy: Secondary | ICD-10-CM

## 2023-02-15 MED ORDER — LEFLUNOMIDE 20 MG PO TABS: ORAL_TABLET | ORAL | 0 refills | Status: DC

## 2023-02-15 NOTE — Patient Instructions (Signed)
Standing Labs We placed an order today for your standing lab work.   Please have your standing labs drawn in June and every 3 months   Please have your labs drawn 2 weeks prior to your appointment so that the provider can discuss your lab results at your appointment, if possible.  Please note that you may see your imaging and lab results in MyChart before we have reviewed them. We will contact you once all results are reviewed. Please allow our office up to 72 hours to thoroughly review all of the results before contacting the office for clarification of your results.  WALK-IN LAB HOURS  Monday through Thursday from 8:00 am -12:30 pm and 1:00 pm-5:00 pm and Friday from 8:00 am-12:00 pm.  Patients with office visits requiring labs will be seen before walk-in labs.  You may encounter longer than normal wait times. Please allow additional time. Wait times may be shorter on  Monday and Thursday afternoons.  We do not book appointments for walk-in labs. We appreciate your patience and understanding with our staff.   Labs are drawn by [facility]. Please bring your co-pay at the time of your lab draw.  You may receive a bill from [facility] for your lab work.  Please note if you are on Hydroxychloroquine and and an order has been placed for a Hydroxychloroquine level,  you will need to have it drawn 4 hours or more after your last dose.  If you wish to have your labs drawn at another location, please call the office 24 hours in advance so we can fax the orders.  The office is located at [address], [address], [place], [place] [address]   If you have any questions regarding directions or hours of operation,  please call [phone].   As a reminder, please drink plenty of water prior to coming for your lab work. Thanks!  

## 2023-03-01 ENCOUNTER — Encounter: Payer: Self-pay | Admitting: *Deleted

## 2023-03-01 ENCOUNTER — Other Ambulatory Visit: Payer: Self-pay | Admitting: *Deleted

## 2023-03-01 DIAGNOSIS — Z79899 Other long term (current) drug therapy: Secondary | ICD-10-CM

## 2023-03-02 ENCOUNTER — Telehealth: Payer: Self-pay

## 2023-03-02 ENCOUNTER — Other Ambulatory Visit: Payer: 59

## 2023-03-02 DIAGNOSIS — Z79899 Other long term (current) drug therapy: Secondary | ICD-10-CM | POA: Diagnosis not present

## 2023-03-02 NOTE — Telephone Encounter (Signed)
Patient contacted the office and advised patient We received your message requesting we send your lab order over to [facility]. We have faxed the order for you. Seth Bake tried to call your phone but was unable to leave a message as your voicemail is full. Patient verbalized understanding.

## 2023-03-09 ENCOUNTER — Other Ambulatory Visit (HOSPITAL_COMMUNITY): Payer: Self-pay

## 2023-03-12 LAB — QUANTIFERON-TB GOLD PLUS
QuantiFERON Mitogen Value: 1.91 IU/mL
QuantiFERON Nil Value: 0.08 IU/mL
QuantiFERON TB1 Ag Value: 0.07 IU/mL
QuantiFERON TB2 Ag Value: 0.07 IU/mL
QuantiFERON-TB Gold Plus: NEGATIVE

## 2023-03-13 NOTE — Progress Notes (Signed)
TB gold negative

## 2023-03-14 ENCOUNTER — Other Ambulatory Visit: Payer: Self-pay

## 2023-03-14 ENCOUNTER — Other Ambulatory Visit (HOSPITAL_COMMUNITY): Payer: Self-pay

## 2023-03-21 ENCOUNTER — Ambulatory Visit: Payer: 59 | Admitting: Family Medicine

## 2023-04-07 ENCOUNTER — Other Ambulatory Visit (HOSPITAL_COMMUNITY): Payer: Self-pay

## 2023-04-12 ENCOUNTER — Other Ambulatory Visit: Payer: Self-pay

## 2023-04-12 DIAGNOSIS — L308 Other specified dermatitis: Secondary | ICD-10-CM | POA: Diagnosis not present

## 2023-04-12 DIAGNOSIS — L281 Prurigo nodularis: Secondary | ICD-10-CM | POA: Diagnosis not present

## 2023-04-16 ENCOUNTER — Other Ambulatory Visit: Payer: Self-pay | Admitting: Family Medicine

## 2023-04-16 DIAGNOSIS — T7840XA Allergy, unspecified, initial encounter: Secondary | ICD-10-CM

## 2023-05-04 ENCOUNTER — Other Ambulatory Visit: Payer: Self-pay | Admitting: Physician Assistant

## 2023-05-04 ENCOUNTER — Other Ambulatory Visit: Payer: Self-pay

## 2023-05-04 ENCOUNTER — Other Ambulatory Visit (HOSPITAL_COMMUNITY): Payer: Self-pay

## 2023-05-04 DIAGNOSIS — M0579 Rheumatoid arthritis with rheumatoid factor of multiple sites without organ or systems involvement: Secondary | ICD-10-CM

## 2023-05-04 MED ORDER — ENBREL MINI 50 MG/ML ~~LOC~~ SOCT
50.0000 mg | SUBCUTANEOUS | 2 refills | Status: DC
  Filled 2023-05-04: qty 4, 28d supply, fill #0
  Filled 2023-06-02: qty 4, 28d supply, fill #1
  Filled 2023-06-28: qty 4, 28d supply, fill #2

## 2023-05-04 NOTE — Telephone Encounter (Signed)
Last Fill: 02/11/2023  Labs: 02/08/2023 CBC is normal.  Creatinine is mildly elevated and is stable   TB Gold: 03/02/2023  TB gold negative   Next Visit: 07/20/2023  Last Visit: 02/15/2023  [id number] arthritis involving multiple sites with positive rheumatoid factor   Current Dose per office note 02/15/2023: Enbrel 50 mg sq injections once weekly   Okay to refill Enbrel?

## 2023-05-09 ENCOUNTER — Other Ambulatory Visit: Payer: Self-pay | Admitting: Physician Assistant

## 2023-05-09 ENCOUNTER — Other Ambulatory Visit (HOSPITAL_COMMUNITY): Payer: Self-pay

## 2023-05-09 NOTE — Telephone Encounter (Signed)
Last Fill: 02/15/2023  Labs: 02/08/2023 CBC is normal.  Creatinine is mildly elevated and is stable.   Next Visit: 07/20/2023  Last Visit: 02/15/2023  DX: Rheumatoid arthritis involving multiple sites with positive rheumatoid factor   Current Dose per office note 02/15/2023: Arava 20 mg 1 tablet by mouth daily   Okay to refill Arava ?

## 2023-05-24 ENCOUNTER — Telehealth: Payer: Self-pay | Admitting: *Deleted

## 2023-05-24 NOTE — Telephone Encounter (Signed)
Patient left message stating she is on Enbrel and her autotouch messed up. Patient states she did not get the full dose on Enbrel. Patient states she would like to know if she needs to take another one or wait until next week. Patient advised she would not need to give herself another injection this week. Patient advised to take her next dose next week as scheduled. Patient expressed understanding.

## 2023-06-02 ENCOUNTER — Other Ambulatory Visit (HOSPITAL_COMMUNITY): Payer: Self-pay

## 2023-06-06 ENCOUNTER — Other Ambulatory Visit (HOSPITAL_COMMUNITY): Payer: Self-pay

## 2023-06-20 ENCOUNTER — Other Ambulatory Visit: Payer: Self-pay | Admitting: *Deleted

## 2023-06-20 DIAGNOSIS — Z79899 Other long term (current) drug therapy: Secondary | ICD-10-CM

## 2023-06-23 ENCOUNTER — Ambulatory Visit (INDEPENDENT_AMBULATORY_CARE_PROVIDER_SITE_OTHER): Payer: 59

## 2023-06-23 ENCOUNTER — Other Ambulatory Visit: Payer: Self-pay | Admitting: Family Medicine

## 2023-06-23 VITALS — Ht 63.0 in | Wt 223.0 lb

## 2023-06-23 DIAGNOSIS — M792 Neuralgia and neuritis, unspecified: Secondary | ICD-10-CM

## 2023-06-23 DIAGNOSIS — M5432 Sciatica, left side: Secondary | ICD-10-CM

## 2023-06-23 DIAGNOSIS — I10 Essential (primary) hypertension: Secondary | ICD-10-CM

## 2023-06-23 DIAGNOSIS — Z87891 Personal history of nicotine dependence: Secondary | ICD-10-CM

## 2023-06-23 DIAGNOSIS — Z0000 Encounter for general adult medical examination without abnormal findings: Secondary | ICD-10-CM

## 2023-06-23 DIAGNOSIS — Z1231 Encounter for screening mammogram for malignant neoplasm of breast: Secondary | ICD-10-CM

## 2023-06-23 NOTE — Progress Notes (Signed)
Because this visit was a virtual/telehealth visit,  certain criteria was not obtained, such a blood pressure, CBG if patient is a diabetic, and timed up and go. Any medications not marked as "taking" was not mentioned during the medication reconciliation part of the visit. Any vitals not documented were not able to be obtained due to this being a telehealth visit. Vitals documented are verbally provided by the patient.   Subjective:   Jean Howell is a 67 y.o. female who presents for Medicare Annual (Subsequent) preventive examination.  Visit Complete: Virtual  I connected with  Jean Howell on 06/23/23 by a audio enabled telemedicine application and verified that I am speaking with the correct person using two identifiers.  Patient Location: Home  Provider Location: Home Office  I discussed the limitations of evaluation and management by telemedicine. The patient expressed understanding and agreed to proceed.  Patient Medicare AWV questionnaire was completed by the patient on n/a; I have confirmed that all information answered by patient is correct and no changes since this date.  Review of Systems     Cardiac Risk Factors include: advanced age (>43men, >61 women);dyslipidemia;hypertension;sedentary lifestyle;obesity (BMI >30kg/m2);smoking/ tobacco exposure     Objective:    Today's Vitals   06/23/23 1259 06/23/23 1302  Weight: 223 lb (101.2 kg)   Height: 5\' 3"  (1.6 m)   PainSc:  5    Body mass index is 39.5 kg/m.     06/23/2023   12:59 PM 09/20/2022   12:09 PM 07/28/2022    1:29 PM 07/05/2022   12:38 PM 07/02/2022    1:42 PM 06/18/2022    2:59 PM 09/21/2021   12:22 PM  Advanced Directives  Does Patient Have a Medical Advance Directive? No No No No Yes Yes No  Type of Agricultural consultant;Living will Healthcare Power of [place];Living will   Does patient want to make changes to medical advance directive?    No - Patient declined No -  Patient declined    Copy of Healthcare Power of Attorney in Chart?     No - copy requested No - copy requested   Would patient like information on creating a medical advance directive? No - Patient declined No - Patient declined  No - Patient declined       Current Medications (verified) Outpatient Encounter Medications as of 06/23/2023  Medication Sig   Calcium-Magnesium-Zinc (CAL-MAG-ZINC PO) Take 1 tablet by mouth daily.   cyanocobalamin (VITAMIN B12) 1000 MCG tablet Take 1 tablet by mouth daily.   docusate sodium (COLACE) 100 MG capsule Take 1 capsule (100 mg total) by mouth daily as needed.   Ergocalciferol (VITAMIN D2) 10 MCG (400 UNIT) TABS Take 1 tablet by mouth daily.   etanercept (ENBREL MINI) 50 MG/ML injection Inject 50 mg into the skin once a week.   fluticasone (FLONASE) 50 MCG/ACT nasal spray USE 2 SPRAYS IN EACH NOSTRIL DAILY   gabapentin (NEURONTIN) 400 MG capsule Take 1 capsule (400 mg total) by mouth at bedtime.   ketoconazole (NIZORAL) 2 % shampoo Lather affected areas and leave on for 10 mins. Then rinse.  Use twice weekly until rash resolves.   leflunomide (ARAVA) 20 MG tablet TAKE ONE (1) TABLET BY MOUTH EVERY DAY   levocetirizine (XYZAL) 5 MG tablet Take 1 tablet (5 mg total) by mouth at bedtime as needed for allergies (or itching).   losartan-hydrochlorothiazide (HYZAAR) 100-12.5 MG tablet TAKE ONE (1) TABLET EACH DAY  Multiple Vitamin (MULTIVITAMIN PO) Take 1 tablet by mouth daily.   ondansetron (ZOFRAN) 4 MG tablet Take 1 tablet (4 mg total) by mouth every 8 (eight) hours as needed for nausea or vomiting.   oxyCODONE-acetaminophen (PERCOCET/ROXICET) 5-325 MG tablet 1 tablet every 4 (four) hours as needed for up to 20 doses.   rosuvastatin (CRESTOR) 10 MG tablet Take 1 tablet (10 mg total) by mouth daily.   Suvorexant (BELSOMRA) 10 MG TABS Take 10 mg by mouth at bedtime as needed (sleep). Place on hold   lidocaine (LIDODERM) 5 % Place 1 patch onto the skin daily.  Remove & Discard patch within 12 hours or as directed by MD (Patient not taking: Reported on 06/23/2023)   methocarbamol (ROBAXIN-750) 750 MG tablet Take 1 tablet (750 mg total) by mouth 2 (two) times daily as needed for muscle spasms. (Patient not taking: Reported on 06/23/2023)   No facility-administered encounter medications on file as of 06/23/2023.    Allergies (verified) Patient has no known allergies.   History: Past Medical History:  Diagnosis Date   Arthritis    Depression    Hypertension    Rheumatoid arthritis (HCC)    Past Surgical History:  Procedure Laterality Date   BREAST EXCISIONAL BIOPSY Bilateral    pt unsure when- benign   BREAST SURGERY     nodules removed   COLONOSCOPY N/A 06/25/2019   Procedure: COLONOSCOPY;  Surgeon: West Bali, MD;  Location: [facility];  Service: Endoscopy;  Laterality: N/A;  1:30   MOLE REMOVAL Right 01/26/2022   foot (patient was put to sleep for procedure)   POLYPECTOMY  06/25/2019   Procedure: POLYPECTOMY;  Surgeon: West Bali, MD;  Location: [facility];  Service: Endoscopy;;   TOTAL KNEE ARTHROPLASTY Right 07/05/2022   Procedure: RIGHT TOTAL KNEE ARTHROPLASTY;  Surgeon: Tarry Kos, MD;  Location: [facility];  Service: Orthopedics;  Laterality: Right;   Family History  Problem Relation Age of Onset   Cancer Father    Colon cancer Father    Thyroid disease Mother    CVA Mother 68   Thyroid disease Sister    Healthy Son    Congestive Heart Failure Sister    Hypertension Sister    Diabetes Sister    Hypertension Sister    Diabetes Sister    Hypertension Sister    Hypertension Sister    Cancer Brother        kidney    Social History   Socioeconomic History   Marital status: Widowed    Spouse name: Not on file   Number of children: 1   Years of education: 12th   Highest education level: GED or equivalent  Occupational History   Occupation: retired  Tobacco Use   Smoking status: Former    Average  packs/day: 1 pack/day for 15.0 years (15.0 ttl pk-yrs)    Types: Cigarettes    Start date: 63    Quit date: 2000    Years since quitting: 24.5    Passive exposure: Past   Smokeless tobacco: Never  Vaping Use   Vaping status: Never Used  Substance and Sexual Activity   Alcohol use: Not Currently   Drug use: Never   Sexual activity: Not Currently    Comment: widowed  Other Topics Concern   Not on file  Social History Narrative   Patient is a widow that resides independently in [place].  She has 1 son, who resides in [place].  Social Determinants of Health   Financial Resource Strain: Low Risk  (06/23/2023)   Overall Financial Resource Strain (CARDIA)    Difficulty of Paying Living Expenses: Not hard at all  Food Insecurity: No Food Insecurity (06/23/2023)   Hunger Vital Sign    Worried About Running Out of Food in the Last Year: Never true    Ran Out of Food in the Last Year: Never true  Transportation Needs: No Transportation Needs (06/23/2023)   PRAPARE - Administrator, Civil Service (Medical): No    Lack of Transportation (Non-Medical): No  Physical Activity: Inactive (09/20/2022)   Exercise Vital Sign    Days of Exercise per Week: 0 days    Minutes of Exercise per Session: 0 min  Stress: No Stress Concern Present (06/23/2023)   [employer] of Occupational Health - Occupational Stress Questionnaire    Feeling of Stress : Not at all  Social Connections: Socially Isolated (06/23/2023)   Social Connection and Isolation Panel [NHANES]    Frequency of Communication with Friends and Family: Twice a week    Frequency of Social Gatherings with Friends and Family: Once a week    Attends Religious Services: Never    Database administrator or Organizations: No    Attends Banker Meetings: Never    Marital Status: Widowed    Tobacco Counseling Counseling given: Yes   Clinical Intake:  Pre-visit preparation completed: Yes  Pain :  0-10 Pain Score: 5  Pain Type: Chronic pain Pain Location: Arm Pain Orientation: Right, Left Pain Descriptors / Indicators: Constant Pain Onset: More than a month ago Pain Frequency: Constant     BMI - recorded: 39.5 Nutritional Status: BMI > 30  Obese Nutritional Risks: None Diabetes: No  How often do you need to have someone help you when you read instructions, pamphlets, or other written materials from your doctor or pharmacy?: 1 - Never  Interpreter Needed?: No  Information entered by :: Abby Saraphina Lauderbaugh, CMA   Activities of Daily Living    06/23/2023    1:12 PM 09/20/2022   12:08 PM  In your present state of health, do you have any difficulty performing the following activities:  Hearing? 0 0  Vision? 0 0  Difficulty concentrating or making decisions? 0 0  Walking or climbing stairs? 1 1  Comment chronic knee pain. uses cane/walker Pain and Unsteady Balance/Gait  Dressing or bathing? 0 0  Doing errands, shopping? 1 0  Preparing Food and eating ? N N  Using the Toilet? N N  In the past six months, have you accidently leaked urine? N N  Do you have problems with loss of bowel control? N N  Managing your Medications? N N  Managing your Finances? N N  Housekeeping or managing your Housekeeping? Y N  Comment has a hard time with housekeeping     Patient Care Team: Raliegh Ip, DO as PCP - General (Family Medicine) Mallipeddi, Orion Modest, MD as PCP - Cardiology (Cardiology) Jena Gauss Gerrit Friends, MD as Consulting Physician (Gastroenterology) Pollyann Savoy, MD as Consulting Physician (Rheumatology) Nita Sells, MD (Dermatology) Michaelle Copas, MD as Referring Physician (Optometry) Tarry Kos, MD as Attending Physician (Orthopedic Surgery)  Indicate any recent Medical Services you may have received from other than [facility] providers in the past year (date may be approximate).     Assessment:   This is a routine wellness examination for  Ani.  Hearing/Vision screen Hearing Screening -  Comments:: Patient denies any hearing difficulties.    Dietary issues and exercise activities discussed:     Goals Addressed               This Visit's Progress     Patient Stated (pt-stated)        Patients goal is to increase her fruits and vegetables       Depression Screen    06/23/2023    1:08 PM 02/08/2023    2:33 PM 11/08/2022    2:22 PM 09/20/2022   12:04 PM 09/07/2022    2:56 PM 07/29/2022   10:00 AM 06/18/2022    2:57 PM  PHQ 2/9 Scores  PHQ - 2 Score 0 0 0 0 0 0 1  PHQ- 9 Score 1 0 2  0 9 5    Fall Risk    06/23/2023    1:08 PM 02/08/2023    2:33 PM 11/08/2022    2:22 PM 09/20/2022   12:07 PM 09/20/2022   12:01 PM  Fall Risk   Falls in the past year? 0 0 1 0 0  Number falls in past yr: 0 0 0 0   Injury with Fall? 0 0 1 0   Risk for fall due to : Impaired balance/gait;Impaired mobility;History of fall(s) No Fall Risks History of fall(s) No Fall Risks   Follow up Falls prevention discussed;Education provided Education provided Education provided Falls prevention discussed;Education provided;Falls evaluation completed     MEDICARE RISK AT HOME:  Medicare Risk at Home - 06/23/23 1305     Any stairs in or around the home? No    If so, are there any without handrails? No    Home free of loose throw rugs in walkways, pet beds, electrical cords, etc? Yes    Adequate lighting in your home to reduce risk of falls? Yes    Life alert? No    Use of a cane, walker or w/c? Yes    Grab bars in the bathroom? No    Shower chair or bench in shower? Yes    Elevated toilet seat or a handicapped toilet? Yes             TIMED UP AND GO:  Was the test performed?  No    Cognitive Function:    06/16/2020    3:27 PM  MMSE - Mini Mental State Exam  Orientation to time 5  Orientation to Place 5  Registration 3  Attention/ Calculation 5  Recall 3  Language- name 2 objects 2  Language- repeat 1  Language-  follow 3 step command 3  Language- read & follow direction 1  Write a sentence 1  Copy design 1  Total score 30        06/23/2023    1:06 PM 06/18/2022    3:00 PM  6CIT Screen  What Year? 0 points 0 points  What month? 0 points 0 points  What time? 0 points 0 points  Count back from 20 0 points 0 points  Months in reverse 0 points 0 points  Repeat phrase 0 points 2 points  Total Score 0 points 2 points    Immunizations Immunization History  Administered Date(s) Administered   Fluad Quad(high Dose 65+) 09/16/2020, 09/07/2022   Influenza, Quadrivalent, Recombinant, Inj, Pf 09/19/2019   PFIZER(Purple Top)SARS-COV-2 Vaccination 05/23/2020, 06/13/2020, 07/31/2020, 02/28/2021   PNEUMOCOCCAL CONJUGATE-20 07/07/2022    TDAP status: Due, Education has been provided regarding the importance of this vaccine. Advised may  receive this vaccine at local pharmacy or Health Dept. Aware to provide a copy of the vaccination record if obtained from local pharmacy or Health Dept. Verbalized acceptance and understanding.  Flu Vaccine status: Up to date  Pneumococcal vaccine status: Up to date  Covid-19 vaccine status: Information provided on how to obtain vaccines.   Qualifies for Shingles Vaccine? Yes   Zostavax completed No   Shingrix Completed?: No.    Education has been provided regarding the importance of this vaccine. Patient has been advised to call insurance company to determine out of pocket expense if they have not yet received this vaccine. Advised may also receive vaccine at local pharmacy or Health Dept. Verbalized acceptance and understanding.  Screening Tests Health Maintenance  Topic Date Due   DTaP/Tdap/Td (1 - Tdap) Never done   Zoster Vaccines- Shingrix (1 of 2) Never done   COVID-19 Vaccine (5 - 2023-24 season) 08/06/2022   Medicare Annual Wellness (AWV)  06/19/2023   DEXA SCAN  10/14/2023 (Originally 09/16/2022)   MAMMOGRAM  07/02/2023   INFLUENZA VACCINE  07/07/2023    Colonoscopy  06/24/2029   Pneumonia Vaccine 31+ Years old  Completed   Hepatitis C Screening  Completed   HPV VACCINES  Aged Out    Health Maintenance  Health Maintenance Due  Topic Date Due   DTaP/Tdap/Td (1 - Tdap) Never done   Zoster Vaccines- Shingrix (1 of 2) Never done   COVID-19 Vaccine (5 - 2023-24 season) 08/06/2022   Medicare Annual Wellness (AWV)  06/19/2023    Colorectal cancer screening: Type of screening: Colonoscopy. Completed 06/25/2019. Repeat every 10 years  Mammogram status: Ordered 06/23/23. Pt provided with contact info and advised to call to schedule appt.   Bone Density Screening: Patient declined  Lung Cancer Screening: (Low Dose CT Chest recommended if Age 47-80 years, 20 pack-year currently smoking OR have quit w/in 15years.) does qualify.   Lung Cancer Screening Referral: placed 06/23/23  Additional Screening:  Hepatitis C Screening: does not qualify; Completed 01/08/2019  Vision Screening: Recommended annual ophthalmology exams for early detection of glaucoma and other disorders of the eye. Is the patient up to date with their annual eye exam?  Yes  Who is the provider or what is the name of the office in which the patient attends annual eye exams? [facility] [place] If pt is not established with a provider, would they like to be referred to a provider to establish care? No .   Dental Screening: Recommended annual dental exams for proper oral hygiene  Diabetic Foot Exam: n/a  Community Resource Referral / Chronic Care Management: CRR required this visit?  No   CCM required this visit?  No     Plan:     I have personally reviewed and noted the following in the patient's chart:   Medical and social history Use of alcohol, tobacco or illicit drugs  Current medications and supplements including opioid prescriptions. Patient is currently taking opioid prescriptions. Information provided to patient regarding non-opioid alternatives. Patient  advised to discuss non-opioid treatment plan with their provider. Functional ability and status Nutritional status Physical activity Advanced directives List of other physicians Hospitalizations, surgeries, and ER visits in previous 12 months Vitals Screenings to include cognitive, depression, and falls Referrals and appointments  In addition, I have reviewed and discussed with patient certain preventive protocols, quality metrics, and best practice recommendations. A written personalized care plan for preventive services as well as general preventive health recommendations were provided to patient.  Jordan Hawks Shuan Statzer, CMA   06/23/2023   After Visit Summary: (MyChart) Due to this being a telephonic visit, the after visit summary with patients personalized plan was offered to patient via MyChart   Nurse Notes: Mammogram and lung cancer screening ordered today

## 2023-06-23 NOTE — Patient Instructions (Signed)
Jean Howell , Thank you for taking time to come for your Medicare Wellness Visit. I appreciate your ongoing commitment to your health goals. Please review the following plan we discussed and let me know if I can assist you in the future.   These are the goals we discussed:  Goals       Patient Stated (pt-stated)      Patients goal is to increase her fruits and vegetables      Prevent falls      Get to walking better, get knee pain under control Be more independent        This is a list of the screening recommended for you and due dates:  Health Maintenance  Topic Date Due   DTaP/Tdap/Td vaccine (1 - Tdap) Never done   Zoster (Shingles) Vaccine (1 of 2) Never done   COVID-19 Vaccine (5 - 2023-24 season) 08/06/2022   DEXA scan (bone density measurement)  10/14/2023*   Mammogram  07/02/2023   Flu Shot  07/07/2023   Medicare Annual Wellness Visit  06/22/2024   Colon Cancer Screening  06/24/2029   Pneumonia Vaccine  Completed   Hepatitis C Screening  Completed   HPV Vaccine  Aged Out  *Topic was postponed. The date shown is not the original due date.    Advanced directives: Advance directive discussed with you today. Even though you declined this today, please call our office should you change your mind, and we can give you the proper paperwork for you to fill out. Advance care planning is a way to make decisions about medical care that fits your values in case you are ever unable to make these decisions for yourself.  Information on Advanced Care Planning can be found at [facility] of [facility] Advance Health Care Directives Advance Health Care Directives ([email or web address]    Conditions/risks identified:  You have an order for:  []   2D Mammogram  [x]   3D Mammogram  []   Bone Density   [x]   Lung Cancer Screening  Please call for appointment:   [facility] [address] [place], [place] [address] Phone: [phone]  Make sure to wear two-piece  clothing.  No lotions powders or deodorants the day of the appointment Make sure to bring picture ID and insurance card.  Bring list of medications you are currently taking including any supplements.   Schedule your [place] screening mammogram through MyChart!   Log into your MyChart account.  Go to 'Visit' (or 'Appointments' if on mobile App) --> Schedule an Appointment  Under 'Select a Reason for Visit' choose the Mammogram Screening option.  Complete the pre-visit questions and select the time and place that best fits your schedule.   Next appointment: VIRTUAL/TELEPHONE APPOINTMENT Follow up in one year for your annual wellness visit  June 25, 2024 at 9:00 am telephone visit.    Preventive Care 19 Years and Older, Female Preventive care refers to lifestyle choices and visits with your health care provider that can promote health and wellness. What does preventive care include? A yearly physical exam. This is also called an annual well check. Dental exams once or twice a year. Routine eye exams. Ask your health care provider how often you should have your eyes checked. Personal lifestyle choices, including: Daily care of your teeth and gums. Regular physical activity. Eating a healthy diet. Avoiding tobacco and drug use. Limiting alcohol use. Practicing safe sex. Taking low-dose aspirin every day. Taking vitamin and mineral supplements  as recommended by your health care provider. What happens during an annual well check? The services and screenings done by your health care provider during your annual well check will depend on your age, overall health, lifestyle risk factors, and family history of disease. Counseling  Your health care provider may ask you questions about your: Alcohol use. Tobacco use. Drug use. Emotional well-being. Home and relationship well-being. Sexual activity. Eating habits. History of falls. Memory and ability to understand  (cognition). Work and work Astronomer. Reproductive health. Screening  You may have the following tests or measurements: Height, weight, and BMI. Blood pressure. Lipid and cholesterol levels. These may be checked every 5 years, or more frequently if you are over 72 years old. Skin check. Lung cancer screening. You may have this screening every year starting at age 60 if you have a 30-pack-year history of smoking and currently smoke or have quit within the past 15 years. Fecal occult blood test (FOBT) of the stool. You may have this test every year starting at age 56. Flexible sigmoidoscopy or colonoscopy. You may have a sigmoidoscopy every 5 years or a colonoscopy every 10 years starting at age 55. Hepatitis C blood test. Hepatitis B blood test. Sexually transmitted disease (STD) testing. Diabetes screening. This is done by checking your blood sugar (glucose) after you have not eaten for a while (fasting). You may have this done every 1-3 years. Bone density scan. This is done to screen for osteoporosis. You may have this done starting at age 15. Mammogram. This may be done every 1-2 years. Talk to your health care provider about how often you should have regular mammograms. Talk with your health care provider about your test results, treatment options, and if necessary, the need for more tests. Vaccines  Your health care provider may recommend certain vaccines, such as: Influenza vaccine. This is recommended every year. Tetanus, diphtheria, and acellular pertussis (Tdap, Td) vaccine. You may need a Td booster every 10 years. Zoster vaccine. You may need this after age 64. Pneumococcal 13-valent conjugate (PCV13) vaccine. One dose is recommended after age 81. Pneumococcal polysaccharide (PPSV23) vaccine. One dose is recommended after age 77. Talk to your health care provider about which screenings and vaccines you need and how often you need them. This information is not intended to  replace advice given to you by your health care provider. Make sure you discuss any questions you have with your health care provider. Document Released: 12/19/2015 Document Revised: 08/11/2016 Document Reviewed: 09/23/2015 Elsevier Interactive Patient Education  2017 [employer].  Fall Prevention in the Home Falls can cause injuries. They can happen to people of all ages. There are many things you can do to make your home safe and to help prevent falls. What can I do on the outside of my home? Regularly fix the edges of walkways and driveways and fix any cracks. Remove anything that might make you trip as you walk through a door, such as a raised step or threshold. Trim any bushes or trees on the path to your home. Use bright outdoor lighting. Clear any walking paths of anything that might make someone trip, such as rocks or tools. Regularly check to see if handrails are loose or broken. Make sure that both sides of any steps have handrails. Any raised decks and porches should have guardrails on the edges. Have any leaves, snow, or ice cleared regularly. Use sand or salt on walking paths during winter. Clean up any spills in your garage  right away. This includes oil or grease spills. What can I do in the bathroom? Use night lights. Install grab bars by the toilet and in the tub and shower. Do not use towel bars as grab bars. Use non-skid mats or decals in the tub or shower. If you need to sit down in the shower, use a plastic, non-slip stool. Keep the floor dry. Clean up any water that spills on the floor as soon as it happens. Remove soap buildup in the tub or shower regularly. Attach bath mats securely with double-sided non-slip rug tape. Do not have throw rugs and other things on the floor that can make you trip. What can I do in the bedroom? Use night lights. Make sure that you have a light by your bed that is easy to reach. Do not use any sheets or blankets that are too big for  your bed. They should not hang down onto the floor. Have a firm chair that has side arms. You can use this for support while you get dressed. Do not have throw rugs and other things on the floor that can make you trip. What can I do in the kitchen? Clean up any spills right away. Avoid walking on wet floors. Keep items that you use a lot in easy-to-reach places. If you need to reach something above you, use a strong step stool that has a grab bar. Keep electrical cords out of the way. Do not use floor polish or wax that makes floors slippery. If you must use wax, use non-skid floor wax. Do not have throw rugs and other things on the floor that can make you trip. What can I do with my stairs? Do not leave any items on the stairs. Make sure that there are handrails on both sides of the stairs and use them. Fix handrails that are broken or loose. Make sure that handrails are as long as the stairways. Check any carpeting to make sure that it is firmly attached to the stairs. Fix any carpet that is loose or worn. Avoid having throw rugs at the top or bottom of the stairs. If you do have throw rugs, attach them to the floor with carpet tape. Make sure that you have a light switch at the top of the stairs and the bottom of the stairs. If you do not have them, ask someone to add them for you. What else can I do to help prevent falls? Wear shoes that: Do not have high heels. Have rubber bottoms. Are comfortable and fit you well. Are closed at the toe. Do not wear sandals. If you use a stepladder: Make sure that it is fully opened. Do not climb a closed stepladder. Make sure that both sides of the stepladder are locked into place. Ask someone to hold it for you, if possible. Clearly mark and make sure that you can see: Any grab bars or handrails. First and last steps. Where the edge of each step is. Use tools that help you move around (mobility aids) if they are needed. These  include: Canes. Walkers. Scooters. Crutches. Turn on the lights when you go into a dark area. Replace any light bulbs as soon as they burn out. Set up your furniture so you have a clear path. Avoid moving your furniture around. If any of your floors are uneven, fix them. If there are any pets around you, be aware of where they are. Review your medicines with your doctor. Some medicines can make  you feel dizzy. This can increase your chance of falling. Ask your doctor what other things that you can do to help prevent falls. This information is not intended to replace advice given to you by your health care provider. Make sure you discuss any questions you have with your health care provider. Document Released: 09/18/2009 Document Revised: 04/29/2016 Document Reviewed: 12/27/2014 Elsevier Interactive Patient Education  2017 [employer].

## 2023-06-28 ENCOUNTER — Other Ambulatory Visit (HOSPITAL_COMMUNITY): Payer: Self-pay

## 2023-06-28 ENCOUNTER — Ambulatory Visit: Payer: 59 | Admitting: Orthopaedic Surgery

## 2023-07-05 ENCOUNTER — Other Ambulatory Visit: Payer: Self-pay

## 2023-07-07 NOTE — Progress Notes (Signed)
Office Visit Note  Patient: Jean Howell             Date of Birth: 1956-04-14           MRN: [id number]             PCP: Raliegh Ip, DO Referring: Raliegh Ip, DO Visit Date: 07/20/2023 Occupation: @GUAROCC @  Subjective:  Rash all over the body and joint pain  History of Present Illness: Jean Howell is a 67 y.o. female with a history of seropositive rheumatoid arthritis and osteoarthritis.  Patient is on Enbrel 50 mg sq injections once weekly and Arava 20 mg 1 tablet by mouth daily.  She complains of rash over bilateral forearms, chest, back and lower abdomen.  The rash surfaced in 2023 but always presented as a single lesion, which she dismissed as unimportant.  However, in January of 2024, the rash began to spread.  She did present to her dermatologist in March of 2024, who asked her if she has eczema and prescribed betamethasone cream.  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  Pt has been using that since 04/12/23 and she says it alleviates the itch somewhat.  However, the rash continues to spread and is intensely pruritic.  She continues to report intense pain and stiffness in her hands, wrists, shoulders, hips, L knee and ankles, worst in her L knee.  She wears compression gloves on her hands, which are helpful.  She underwent a R knee replacement in July of 2023 (of note, she did d/c her medications at that time but restarted in September of 2023).  She can history of intermittent swelling in her wrist joints and hands.     Activities of Daily Living:  Patient reports morning stiffness for all day. Patient Reports nocturnal pain.  Difficulty dressing/grooming: Reports Difficulty climbing stairs: Reports Difficulty getting out of chair: Reports Difficulty using hands for taps, buttons, cutlery, and/or writing: Reports  Review of Systems  Constitutional:  Positive for fatigue.  HENT:  Negative for mouth sores and mouth dryness.   Eyes:  Positive for itching.  Negative for dryness.  Respiratory:  Negative for shortness of breath.   Cardiovascular:  Negative for chest pain and palpitations.  Gastrointestinal:  Negative for blood in stool, constipation and diarrhea.  Endocrine: Negative for increased urination.  Genitourinary:  Negative for involuntary urination.  Musculoskeletal:  Positive for joint pain, gait problem, joint pain, joint swelling, myalgias, muscle weakness, morning stiffness, muscle tenderness and myalgias.  Skin:  Positive for rash and hair loss. Negative for color change and sensitivity to sunlight.  Allergic/Immunologic: Negative for susceptible to infections.  Neurological:  Positive for fainting. Negative for dizziness and headaches.  Hematological:  Negative for swollen glands.  Psychiatric/Behavioral:  Positive for sleep disturbance. Negative for depressed mood. The patient is not nervous/anxious.     PMFS History:  Patient Active Problem List   Diagnosis Date Noted   Pigmented skin lesion of uncertain nature 02/08/2023   Syncope and collapse 12/27/2022   HLD (hyperlipidemia) 12/27/2022   Other intervertebral disc degeneration, lumbar region 11/18/2022   Status post total right knee replacement 07/05/2022   Primary osteoarthritis of right knee 06/10/2022   Osteopenia of neck of right femur 03/16/2022   Full code status 06/16/2020   Impaired mobility and endurance 06/16/2020   Sciatica of left side 06/16/2020   Rheumatoid arthritis involving multiple sites with positive rheumatoid factor (HCC) 03/06/2019   Primary osteoarthritis of both knees 03/06/2019   Former  smoker 03/06/2019   ANA positive 02/12/2019   Family history of thyroid disease 01/08/2019   Morbid obesity (HCC) 01/08/2019   Essential hypertension 01/08/2019   Family history of colon cancer 01/08/2019    Past Medical History:  Diagnosis Date   Arthritis    Depression    Hypertension    Rheumatoid arthritis (HCC)     Family History  Problem  Relation Age of Onset   Thyroid disease Mother    CVA Mother 70   Cancer Father    Colon cancer Father    Thyroid disease Sister    Congestive Heart Failure Sister    Hypertension Sister    Diabetes Sister    Hypertension Sister    Diabetes Sister    Hypertension Sister    Hypertension Sister    Cancer Brother        kidney    Healthy Son    Breast cancer Neg Hx    Past Surgical History:  Procedure Laterality Date   BREAST EXCISIONAL BIOPSY Bilateral    pt unsure when- benign   BREAST SURGERY     nodules removed   COLONOSCOPY N/A 06/25/2019   Procedure: COLONOSCOPY;  Surgeon: West Bali, MD;  [facility];  Service: Endoscopy;  Laterality: N/A;  1:30   MOLE REMOVAL Right 01/26/2022   foot (patient was put to sleep for procedure)   POLYPECTOMY  06/25/2019   Procedure: POLYPECTOMY;  Surgeon: West Bali, MD;  [facility]: [facility];  Service: Endoscopy;;   TOTAL KNEE ARTHROPLASTY Right 07/05/2022   Procedure: RIGHT TOTAL KNEE ARTHROPLASTY;  Surgeon: Tarry Kos, MD;  Location: [facility];  Service: Orthopedics;  Laterality: Right;   Social History   Social History Narrative   Patient is a widow that resides independently in [place].  She has 1 son, who resides in [place].   Immunization History  Administered Date(s) Administered   Fluad Quad(high Dose 65+) 09/16/2020, 09/07/2022   Influenza, Quadrivalent, Recombinant, Inj, Pf 09/19/2019   PFIZER(Purple Top)SARS-COV-2 Vaccination 05/23/2020, 06/13/2020, 07/31/2020, 02/28/2021   PNEUMOCOCCAL CONJUGATE-20 07/07/2022     Objective: Vital Signs: BP 94/71 (BP Location: Left Arm, Patient Position: Sitting, Cuff Size: Large)   Pulse (!) 110   Resp 16   Ht 5\' 3"  (1.6 m)   Wt 221 lb 3.2 oz (100.3 kg)   BMI 39.18 kg/m    Physical Exam Vitals and nursing note reviewed.  Constitutional:      Appearance: She is well-developed.  HENT:     Head: Normocephalic and atraumatic.  Eyes:      Conjunctiva/sclera: Conjunctivae normal.  Cardiovascular:     Rate and Rhythm: Normal rate and regular rhythm.     Heart sounds: Normal heart sounds.  Pulmonary:     Effort: Pulmonary effort is normal.     Breath sounds: Normal breath sounds.  Abdominal:     General: Bowel sounds are normal.     Palpations: Abdomen is soft.  Musculoskeletal:     Cervical back: Normal range of motion.  Lymphadenopathy:     Cervical: No cervical adenopathy.  Skin:    General: Skin is warm and dry.     Capillary Refill: Capillary refill takes less than 2 seconds.     Comments: Scaly maculopapular rash was noted on the chest, abdomen, back and bilateral arms.  She also had few lesions on her lower extremities.  Neurological:     Mental Status: She is alert and oriented to  person, place, and time.  Psychiatric:        Behavior: Behavior normal.      Musculoskeletal Exam: Cervical spine with good range of motion.  Shoulder joints, elbow joints, wrist joints were in good range of motion.  She had tenderness on palpation of her bilateral wrist joints.  She has synovitis of her right third MCP joint.  PIP and DIP thickening was noted.  MCP thickening was noted.  Hip joints were difficult to assess in the seated position.  Right knee joint was replaced and was in good range of motion.  Left knee joint was in good range of motion with discomfort.  No warmth swelling or effusion was noted.  She had no tenderness over the MTPs.  She had mild tenderness over right ankle joint.  CDAI Exam: CDAI Score: 14  Patient Global: 50 / 100; Provider Global: 50 / 100 Swollen: 1 ; Tender: 4  Joint Exam 07/20/2023      Right  Left  Wrist   Tender   Tender  MCP 3  Swollen Tender     Ankle   Tender        Investigation: No additional findings.  Imaging: MM 3D SCREENING MAMMOGRAM BILATERAL BREAST  Result Date: 07/13/2023 CLINICAL DATA:  Screening. EXAM: DIGITAL SCREENING BILATERAL MAMMOGRAM WITH TOMOSYNTHESIS AND CAD  TECHNIQUE: Bilateral screening digital craniocaudal and mediolateral oblique mammograms were obtained. Bilateral screening digital breast tomosynthesis was performed. The images were evaluated with computer-aided detection. COMPARISON:  Previous exam(s). ACR Breast Density Category b: There are scattered areas of fibroglandular density. FINDINGS: There are no findings suspicious for malignancy. IMPRESSION: No mammographic evidence of malignancy. A result letter of this screening mammogram will be mailed directly to the patient. RECOMMENDATION: Screening mammogram in one year. (Code:SM-B-01Y) BI-RADS CATEGORY  1: Negative. Electronically Signed   By: Norva Pavlov M.D.   On: 07/13/2023 13:38   XR Knee 1-2 Views Right  Result Date: 07/12/2023 Stable right total knee replacement in good alignment    Recent Labs: Lab Results  Component Value Date   WBC 4.9 07/11/2023   HGB 12.7 07/11/2023   PLT 254 07/11/2023   NA 143 07/11/2023   K 3.9 07/11/2023   CL 102 07/11/2023   CO2 27 07/11/2023   GLUCOSE 112 (H) 07/11/2023   BUN 16 07/11/2023   CREATININE 0.96 07/11/2023   BILITOT 0.4 07/11/2023   ALKPHOS 62 07/11/2023   AST 18 07/11/2023   ALT 17 07/11/2023   PROT 7.4 07/11/2023   ALBUMIN 3.6 (L) 07/11/2023   CALCIUM 9.5 07/11/2023   GFRAA 88 01/07/2021   QFTBGOLDPLUS Negative 03/02/2023    Speciality Comments: PLQ Eye Exam: 12/16/2018 WNL @ Dr Despina Arias Follow up in 1 year Humira 10/20-11/22 Enbrel 11/22-08/24  Procedures:  No procedures performed Allergies: Patient has no known allergies.   Assessment / Plan:     Visit Diagnoses: Rheumatoid arthritis involving multiple sites with positive rheumatoid factor (HCC) - Positive RF, +14-3-3 eta, Positive ANA, erosive inflammatory: Patient continues to have pain and discomfort in multiple joints.  She complains of intermittent swelling in her joints.  Mild synovitis was noted in the right third MCP joint.  She states she has been taking Enbrel  and leflunomide without any interruption except for the knee joint surgery she stopped both medications.  She has some tenderness in her joints as described above.  She has developed new onset rash which she states have been progressively getting worse over the last  year.  She believes the rash is due to Enbrel.  I did detailed discussion with the patient that the rash could be psoriasis from Enbrel.  I advised her to discontinue Enbrel.  Different treatment options and their side effects were discussed.  After reviewing indications and side effects of Orencia we decided to proceed with Orencia.  A handout was given and consent was taken.  I gave her a prescription for prednisone starting at 20 mg and taper by 5 mg every 4 days due to ongoing joint inflammation and rash.  Side effects of prednisone use including the risk of hypertension, diabetes, weight gain, cataracts, osteoporosis was discussed.  Patient voiced understanding.  Medication counseling:  TB Gold: March 02, 2023 Hepatitis panel: Apr 17, 2021 hepatitis C negative, February 26, 2019 hepatitis B negative SPEP: February 26, 2019 Immunoglobulins: February 26, 2019  Does patient have a diagnosis of COPD? No  Counseled patient that Dub Amis is a selective T-cell costimulation blocker indicated for rheumatoid arthritis.  Counseled patient on purpose, proper use, and adverse effects of Orencia. The most common adverse effects are increased risk of infections, headache, and injection site reactions.  There is the possibility of an increased risk of malignancy but it is not well understood if this increased risk is due to the medication or the disease state.  Reviewed the importance of regular labs while on Orencia therapy.  Counseled patient that Dub Amis should be held prior to scheduled surgery.  Counseled patient to avoid live vaccines while on Orencia.  Advised patient to get annual influenza vaccine and the pneumococcal vaccine as indicated.  Provided  patient with medication education material and answered all questions.  Patient consented to [facility].  Will upload consent into patient's chart.  Will apply for Orencia through patient's insurance.  Reviewed storage information for Orencia.  Advised initial injection must be administered in office.     High risk medication use - Enbrel 50 mg sq injections once weekly and Arava 20 mg 1 tablet by mouth daily.  Labs obtained on July 11, 2023 CBC and CMP were normal.  TB Gold was negative on March 02, 2023.  She was advised to get labs in a month after starting Orencia and then every 3 months to monitor for drug toxicity.  Information on immunization was placed in the AVS.  She was advised to hold both medications if she develops an infection resume after the infection resolves.  Rash-patient had pruritic extensive maculopapular scaly rash on her chest, abdomen, back and upper and lower extremities.  The rash appears psoriasiform.  It is most like related to Enbrel.  Will stop Enbrel.  Primary osteoarthritis of left knee-she continues to have pain and discomfort in her left knee joint.  No warmth swelling or effusion was noted.  Previous x-rays were consistent with moderate osteoarthritis.  S/P TKR (total knee replacement), right - Performed by Dr. Roda Shutters on 07/05/22.  She continues to have pain and discomfort in her right knee joint.  ANA positive - No clinical features of systemic lupus at this time.  Other fatigue-she continues to have fatigue due to ongoing arthritis.  Other medical problems are listed as follows:  Essential hypertension-blood pressure was normal is 94/71.  History of depression  Former smoker  Family history of colon cancer  Family history of thyroid disease  Orders: No orders of the defined types were placed in this encounter.  No orders of the defined types were placed in this encounter.  Follow-Up Instructions: Return in about 2 months (around 09/19/2023) for  Rheumatoid arthritis.   Pollyann Savoy, MD  Note - This record has been created using Animal nutritionist.  Chart creation errors have been sought, but may not always  have been located. Such creation errors do not reflect on  the standard of medical care.

## 2023-07-11 ENCOUNTER — Other Ambulatory Visit: Payer: 59

## 2023-07-11 ENCOUNTER — Other Ambulatory Visit: Payer: Self-pay | Admitting: *Deleted

## 2023-07-11 ENCOUNTER — Ambulatory Visit
Admission: RE | Admit: 2023-07-11 | Discharge: 2023-07-11 | Disposition: A | Discharge status: Home / Self Care | Payer: 59 | Source: Ambulatory Visit | Attending: Family Medicine | Admitting: Family Medicine

## 2023-07-11 DIAGNOSIS — Z1231 Encounter for screening mammogram for malignant neoplasm of breast: Secondary | ICD-10-CM

## 2023-07-11 DIAGNOSIS — Z79899 Other long term (current) drug therapy: Secondary | ICD-10-CM

## 2023-07-12 ENCOUNTER — Ambulatory Visit (INDEPENDENT_AMBULATORY_CARE_PROVIDER_SITE_OTHER): Payer: 59 | Admitting: Orthopaedic Surgery

## 2023-07-12 ENCOUNTER — Encounter: Payer: Self-pay | Admitting: Orthopaedic Surgery

## 2023-07-12 ENCOUNTER — Telehealth: Payer: Self-pay | Admitting: *Deleted

## 2023-07-12 ENCOUNTER — Other Ambulatory Visit (INDEPENDENT_AMBULATORY_CARE_PROVIDER_SITE_OTHER): Payer: 59

## 2023-07-12 DIAGNOSIS — Z96651 Presence of right artificial knee joint: Secondary | ICD-10-CM

## 2023-07-12 NOTE — Progress Notes (Signed)
CBC is normal.  Glucose is mildly elevated, probably not a fasting sample.

## 2023-07-12 NOTE — Progress Notes (Signed)
Office Visit Note   Patient: Jean Howell           Date of Birth: 07-10-1956           MRN: [id number] Visit Date: 07/12/2023              Requested by: Jean Ip, DO [address] [place],  [place] [address] PCP: Jean Ip, DO   Assessment & Plan: Visit Diagnoses:  1. Status post total right knee replacement     Plan: Jean Howell is 1 year postop at this point.  Dental prophylaxis reinforced.  She has done really well from the surgery and very pleased overall.  Recheck in another year with repeat x-rays of the right knee.  Follow-Up Instructions: Return in about 1 year (around 07/11/2024) for with Jean Howell.   Orders:  Orders Placed This Encounter  Procedures   XR Knee 1-2 Views Right   No orders of the defined types were placed in this encounter.     Procedures: No procedures performed   Clinical Data: No additional findings.   Subjective: Chief Complaint  Patient presents with   Right Knee - Follow-up    Right TKA 07/05/2022    HPI Jean Howell is 1 year status post right total knee replacement.  She is doing well overall.  No complaints other than some soreness after activity. Review of Systems   Objective: Vital Signs: There were no vitals taken for this visit.  Physical Exam  Ortho Exam Examination right knee shows fully healed surgical scar with excellent range of motion. Specialty Comments:  No specialty comments available.  Imaging: XR Knee 1-2 Views Right  Result Date: 07/12/2023 Stable right total knee replacement in good alignment     PMFS History: Patient Active Problem List   Diagnosis Date Noted   Pigmented skin lesion of uncertain nature 02/08/2023   Syncope and collapse 12/27/2022   HLD (hyperlipidemia) 12/27/2022   Other intervertebral disc degeneration, lumbar region 11/18/2022   Status post total right knee replacement 07/05/2022   Primary osteoarthritis of right knee 06/10/2022   Osteopenia of neck of right femur  03/16/2022   Full code status 06/16/2020   Impaired mobility and endurance 06/16/2020   Sciatica of left side 06/16/2020   Rheumatoid arthritis involving multiple sites with positive rheumatoid factor (HCC) 03/06/2019   Primary osteoarthritis of both knees 03/06/2019   Former smoker 03/06/2019   ANA positive 02/12/2019   Family history of thyroid disease 01/08/2019   Morbid obesity (HCC) 01/08/2019   Essential hypertension 01/08/2019   Family history of colon cancer 01/08/2019   Past Medical History:  Diagnosis Date   Arthritis    Depression    Hypertension    Rheumatoid arthritis (HCC)     Family History  Problem Relation Age of Onset   Thyroid disease Mother    CVA Mother 23   Cancer Father    Colon cancer Father    Thyroid disease Sister    Congestive Heart Failure Sister    Hypertension Sister    Diabetes Sister    Hypertension Sister    Diabetes Sister    Hypertension Sister    Hypertension Sister    Cancer Brother        kidney    Healthy Son    Breast cancer Neg Hx     Past Surgical History:  Procedure Laterality Date   BREAST EXCISIONAL BIOPSY Bilateral    pt unsure when- benign   BREAST SURGERY  nodules removed   COLONOSCOPY N/A 06/25/2019   Procedure: COLONOSCOPY;  Surgeon: West Bali, MD;  [facility];  Service: Endoscopy;  Laterality: N/A;  1:30   MOLE REMOVAL Right 01/26/2022   foot (patient was put to sleep for procedure)   POLYPECTOMY  06/25/2019   Procedure: POLYPECTOMY;  Surgeon: West Bali, MD;  [facility];  Service: Endoscopy;;   TOTAL KNEE ARTHROPLASTY Right 07/05/2022   Procedure: RIGHT TOTAL KNEE ARTHROPLASTY;  Surgeon: Tarry Kos, MD;  Location: [facility];  Service: Orthopedics;  Laterality: Right;   Social History   Occupational History   Occupation: retired  Tobacco Use   Smoking status: Former    Average packs/day: 1 pack/day for 15.0 years (15.0 ttl pk-yrs)    Types: Cigarettes    Start date:  45    Quit date: 2000    Years since quitting: 24.6    Passive exposure: Past   Smokeless tobacco: Never  Vaping Use   Vaping status: Never Used  Substance and Sexual Activity   Alcohol use: Not Currently   Drug use: Never   Sexual activity: Not Currently    Comment: widowed

## 2023-07-12 NOTE — Telephone Encounter (Signed)
Ortho bundle 1 year call completed. ?

## 2023-07-20 ENCOUNTER — Other Ambulatory Visit: Payer: Self-pay

## 2023-07-20 ENCOUNTER — Ambulatory Visit: Payer: 59 | Attending: Rheumatology | Admitting: Rheumatology

## 2023-07-20 ENCOUNTER — Encounter: Payer: Self-pay | Admitting: Rheumatology

## 2023-07-20 VITALS — BP 94/71 | HR 110 | Resp 16 | Ht 63.0 in | Wt 221.2 lb

## 2023-07-20 DIAGNOSIS — Z79899 Other long term (current) drug therapy: Secondary | ICD-10-CM

## 2023-07-20 DIAGNOSIS — Z8349 Family history of other endocrine, nutritional and metabolic diseases: Secondary | ICD-10-CM | POA: Diagnosis not present

## 2023-07-20 DIAGNOSIS — R21 Rash and other nonspecific skin eruption: Secondary | ICD-10-CM | POA: Diagnosis not present

## 2023-07-20 DIAGNOSIS — Z87891 Personal history of nicotine dependence: Secondary | ICD-10-CM | POA: Diagnosis not present

## 2023-07-20 DIAGNOSIS — Z8659 Personal history of other mental and behavioral disorders: Secondary | ICD-10-CM

## 2023-07-20 DIAGNOSIS — Z800 Family history of malignant neoplasm of digestive organs: Secondary | ICD-10-CM

## 2023-07-20 DIAGNOSIS — I10 Essential (primary) hypertension: Secondary | ICD-10-CM

## 2023-07-20 DIAGNOSIS — M1712 Unilateral primary osteoarthritis, left knee: Secondary | ICD-10-CM | POA: Diagnosis not present

## 2023-07-20 DIAGNOSIS — Z96651 Presence of right artificial knee joint: Secondary | ICD-10-CM | POA: Diagnosis not present

## 2023-07-20 DIAGNOSIS — M0579 Rheumatoid arthritis with rheumatoid factor of multiple sites without organ or systems involvement: Secondary | ICD-10-CM | POA: Diagnosis not present

## 2023-07-20 DIAGNOSIS — R5383 Other fatigue: Secondary | ICD-10-CM | POA: Diagnosis not present

## 2023-07-20 DIAGNOSIS — R768 Other specified abnormal immunological findings in serum: Secondary | ICD-10-CM

## 2023-07-20 MED ORDER — PREDNISONE 5 MG PO TABS: ORAL_TABLET | ORAL | 0 refills | Status: DC

## 2023-07-20 NOTE — Patient Instructions (Addendum)
Discontinue Enbrel  Continue leflunomide 20 mg tablets, 1 tablet by mouth daily  Abatacept Injection What is this medication? ABATACEPT (a ba TA sept) treats autoimmune conditions, such as arthritis. It may also be used to treat a condition that can occur after a stem cell or bone marrow transplant (chronic graft versus host disease). It works by slowing down an overactive immune system. This medicine may be used for other purposes; ask your health care provider or pharmacist if you have questions. COMMON BRAND NAME(S): Reatha Armour ClickJect What should I tell my care team before I take this medication? They need to know if you have any of these conditions: Cancer Diabetes Having surgery Hepatitis B or history of hepatitis B infection Immune system problems Infection, especially a viral infection, such as chickenpox, cold sores, herpes Lung or breathing problems, such as asthma or COPD Recent or upcoming vaccine Tuberculosis, a positive skin test for tuberculosis, or have recently been in close contact with someone who has tuberculosis An unusual or allergic reaction to abatacept, other medications, foods, dyes, or preservatives Pregnant or trying to get pregnant Breastfeeding How should I use this medication? This medication is infused into a vein or injected under the skin. Infusions are given by your care team in a hospital or clinic setting. It may be injected under the skin at home. If you get this medication at home, you will be taught how to prepare and give it. Use exactly as directed. Take it as directed on the prescription label. Keep taking it unless your care team tells you to stop. It is important that you put your used needles and syringes in a special sharps container. Do not put them in a trash can. If you do not have a sharps container, call your pharmacist or care team to get one. Talk to your care team about the use of this medication in children. While it may be  prescribed for children as young as 2 years for selected conditions, precautions do apply. Overdosage: If you think you have taken too much of this medicine contact a poison control center or emergency room at once. NOTE: This medicine is only for you. Do not share this medicine with others. What if I miss a dose? If you miss a dose, take it as soon as you can. If it is almost time for your next dose, take only that dose. Do not take double or extra doses. What may interact with this medication? Do not take this medication with any of the following: Live virus vaccines This medication may also interact with the following: Anakinra Baricitinib Canakinumab Medications that lower your chance of fighting an infection Rituximab TNF blockers, such as adalimumab, certolizumab, etanercept, golimumab, infliximab Tocilizumab Tofacitinib Upadacitinib Ustekinumab This list may not describe all possible interactions. Give your health care provider a list of all the medicines, herbs, non-prescription drugs, or dietary supplements you use. Also tell them if you smoke, drink alcohol, or use illegal drugs. Some items may interact with your medicine. What should I watch for while using this medication? Visit your care team for regular checks on your progress. Tell your care team if your symptoms do not start to get better or if they get worse. You will be tested for tuberculosis (TB) before you start this medication. If your care team prescribed any medication for TB, you should start taking the TB medication before starting this medication. Make sure to finish the full course of TB medication. This medication may increase  your risk of getting an infection. Call your care team if you get fever, chills, or sore throat, or other symptoms of a cold or flu. Do not treat yourself. Try to avoid being around people who are sick. If you have diabetes and are getting this medication as an infusion, it may give false  high blood sugar readings on the day of your dose. This may happen if you use certain types of blood glucose tests. Your care team may tell you to use a different way to monitor your blood sugar levels. What side effects may I notice from receiving this medication? Side effects that you should report to your care team as soon as possible: Allergic reactions--skin rash, itching, hives, swelling of the face, lips, tongue, or throat Infection--fever, chills, cough, sore throat, wounds that don't heal, pain or trouble when passing urine, general feeling of discomfort or being unwell Side effects that usually do not require medical attention (report to your care team if they continue or are bothersome): Back pain Cough Dizziness Headache Runny or stuffy nose Sore throat This list may not describe all possible side effects. Call your doctor for medical advice about side effects. You may report side effects to FDA at 1-800-FDA-1088. Where should I keep my medication? If you take this medication at home, keep out of the reach of children and pets. Store in the refrigerator. Keep this medication in the original container. Protect from light. Do not freeze. Do not shake. Get rid of any unused medication after the expiration date. To get rid of medications that are no longer needed or have expired: Take the medication to a medication take-back program. Check with your pharmacy or law enforcement to find a location. If you cannot return the medication, ask your pharmacist or care team how to get rid of the medication safely. NOTE: This sheet is a summary. It may not cover all possible information. If you have questions about this medicine, talk to your doctor, pharmacist, or health care provider.  2024 Elsevier/Gold Standard (2022-04-22 00:00:00)  Standing Labs We placed an order today for your standing lab work.   Please have your standing labs drawn in in 1 month after starting Orencia and then every  3 months  Please have your labs drawn 2 weeks prior to your appointment so that the provider can discuss your lab results at your appointment, if possible.  Please note that you may see your imaging and lab results in MyChart before we have reviewed them. We will contact you once all results are reviewed. Please allow our office up to 72 hours to thoroughly review all of the results before contacting the office for clarification of your results.  WALK-IN LAB HOURS  Monday through Thursday from 8:00 am -12:30 pm and 1:00 pm-5:00 pm and Friday from 8:00 am-12:00 pm.  Patients with office visits requiring labs will be seen before walk-in labs.  You may encounter longer than normal wait times. Please allow additional time. Wait times may be shorter on  Monday and Thursday afternoons.  We do not book appointments for walk-in labs. We appreciate your patience and understanding with our staff.   Labs are drawn by [facility]. Please bring your co-pay at the time of your lab draw.  You may receive a bill from [facility] for your lab work.  Please note if you are on Hydroxychloroquine and and an order has been placed for a Hydroxychloroquine level,  you will need to have it drawn 4 hours  or more after your last dose.  If you wish to have your labs drawn at another location, please call the office 24 hours in advance so we can fax the orders.  The office is located at [address], [place], [place] [address]   If you have any questions regarding directions or hours of operation,  please call [phone].   As a reminder, please drink plenty of water prior to coming for your lab work. Thanks!   Vaccines You are taking a medication(s) that can suppress your immune system.  The following immunizations are recommended: Flu annually Covid-19  RSV Td/Tdap (tetanus, diphtheria, pertussis) every 10 years Pneumonia (Prevnar 15 then Pneumovax 23 at least 1 year apart.  Alternatively, can take  Prevnar 20 without needing additional dose) Shingrix: 2 doses from 4 weeks to 6 months apart  Please check with your PCP to make sure you are up to date.   If you have signs or symptoms of an infection or start antibiotics: First, call your PCP for workup of your infection. Hold your medication through the infection, until you complete your antibiotics, and until symptoms resolve if you take the following: Injectable medication (Actemra, Benlysta, Cimzia, Cosentyx, Enbrel, Humira, Kevzara, Orencia, Remicade, Simponi, Stelara, Taltz, Tremfya) Methotrexate Leflunomide (Arava) Mycophenolate (Cellcept) Harriette Ohara, Olumiant, or Rinvoq

## 2023-07-20 NOTE — Progress Notes (Signed)
Please review and sign pended prednisone taper. Thanks!

## 2023-07-21 ENCOUNTER — Other Ambulatory Visit: Payer: Self-pay | Admitting: Physician Assistant

## 2023-07-21 ENCOUNTER — Telehealth: Payer: Self-pay | Admitting: Pharmacist

## 2023-07-21 DIAGNOSIS — Z79899 Other long term (current) drug therapy: Secondary | ICD-10-CM

## 2023-07-21 DIAGNOSIS — M0579 Rheumatoid arthritis with rheumatoid factor of multiple sites without organ or systems involvement: Secondary | ICD-10-CM

## 2023-07-21 NOTE — Telephone Encounter (Addendum)
Please start Orencia SQ BIV  Has developed rashg from Enbrel  Dx: RA Dose: 125mg  SQ once weekly  Chesley Mires, PharmD, MPH, BCPS, CPP Clinical Pharmacist (Rheumatology and Pulmonology)  ----- Message from Nurse Richardo Priest sent at 07/21/2023  3:27 PM EDT ----- Per Dr. Corliss Skains, patient consented on Orencia. Please run BIV. Consent form signed and send to scan place. Thanks!

## 2023-07-22 NOTE — Telephone Encounter (Signed)
Submitted a Prior Authorization request to [facility] for [facility] via CoverMyMeds. Will update once we receive a response.  Key: [id number]  Chesley Mires, PharmD, MPH, BCPS, CPP Clinical Pharmacist (Rheumatology and Pulmonology)

## 2023-07-22 NOTE — Telephone Encounter (Signed)
Last Fill: 05/09/2023  Labs: 07/11/2023 CBC is normal.  Glucose is mildly elevated, probably not a fasting sample.   Next Visit: 10/12/2023  Last Visit: 07/20/2023  DX: Rheumatoid arthritis involving multiple sites with positive rheumatoid facto   Current Dose per office note 07/20/2023: Arava 20 mg 1 tablet by mouth daily   Okay to refill Arava ?

## 2023-07-28 ENCOUNTER — Other Ambulatory Visit: Payer: Self-pay

## 2023-07-29 ENCOUNTER — Other Ambulatory Visit: Payer: Self-pay

## 2023-07-29 ENCOUNTER — Other Ambulatory Visit (HOSPITAL_COMMUNITY): Payer: Self-pay

## 2023-07-29 MED ORDER — ORENCIA CLICKJECT 125 MG/ML ~~LOC~~ SOAJ
125.0000 mg | SUBCUTANEOUS | 0 refills | Status: DC
Start: 2023-07-29 — End: 2023-08-04
  Filled 2023-07-29: qty 4, 28d supply, fill #0
  Filled 2023-07-29: qty 4, fill #0

## 2023-07-29 NOTE — Telephone Encounter (Signed)
Received notification from [facility] regarding a prior authorization for [facility]. Authorization has been APPROVED from 07/22/23 to 01/22/24. Approval letter sent to scan center.  Per test claim, copay for 28 days supply is $0  Patient can fill through [facility]: [phone]   Authorization # [id number] Phone # [phone]  Rx sent to [facility] to be couriered to clinic prior to 08/03/23. Patient can be scheduled for Orencia new start. Cna start one week after last Enbrel dose  Chesley Mires, PharmD, MPH, BCPS, CPP Clinical Pharmacist (Rheumatology and Pulmonology)

## 2023-07-29 NOTE — Telephone Encounter (Signed)
Delivery instructions have been updated in [facility], medication will be couriered to [facility] on 08/02/23.  Rx has been processed in [facility] and the patient has no copay at this time.

## 2023-08-01 NOTE — Telephone Encounter (Signed)
Patient scheduled for Orencia new start on 08/04/23. She confirms no active abx use or upcoming surgeries/procedures. Her last Enbrel dose was about 2 weeks ago (did not take dose last week)  Chesley Mires, PharmD, MPH, BCPS, CPP Clinical Pharmacist (Rheumatology and Pulmonology)

## 2023-08-01 NOTE — Progress Notes (Signed)
Pharmacy Note  Subjective:   Patient presents to clinic today to receive first dose of Orencia SQ for rheumatoid arthritis. She is transitioning from weekly Enbrel. Last dose of Enbrel was two weeks ago. Was on Humira prior to this. She continues leflunomide 20mg  once daily. She is also completing a prednisone taper that was prescribed at last OV.   She is nervous to start because of prior experience with Enbrel injections. States injections were painful for her unless she left them at room temperature for 3 days  Patient running a fever or have signs/symptoms of infection? No  Patient currently on antibiotics for the treatment of infection? No  Patient have any upcoming invasive procedures/surgeries? No  Objective: CMP     Component Value Date/Time   NA 143 07/11/2023 1318   K 3.9 07/11/2023 1318   CL 102 07/11/2023 1318   CO2 27 07/11/2023 1318   GLUCOSE 112 (H) 07/11/2023 1318   GLUCOSE 85 07/02/2022 1401   BUN 16 07/11/2023 1318   CREATININE 0.96 07/11/2023 1318   CREATININE 1.18 (H) 05/31/2022 1053   CALCIUM 9.5 07/11/2023 1318   PROT 7.4 07/11/2023 1318   ALBUMIN 3.6 (L) 07/11/2023 1318   AST 18 07/11/2023 1318   ALT 17 07/11/2023 1318   ALKPHOS 62 07/11/2023 1318   BILITOT 0.4 07/11/2023 1318   GFRNONAA 51 (L) 07/02/2022 1401   GFRNONAA 76 01/07/2021 1410   GFRAA 88 01/07/2021 1410    CBC    Component Value Date/Time   WBC 4.9 07/11/2023 1318   WBC 9.5 07/06/2022 0602   RBC 4.37 07/11/2023 1318   RBC 3.43 (L) 07/06/2022 0602   HGB 12.7 07/11/2023 1318   HCT 38.4 07/11/2023 1318   PLT 254 07/11/2023 1318   MCV 88 07/11/2023 1318   MCH 29.1 07/11/2023 1318   MCH 29.4 07/06/2022 0602   MCHC 33.1 07/11/2023 1318   MCHC 33.9 07/06/2022 0602   RDW 14.0 07/11/2023 1318   LYMPHSABS 1.9 07/11/2023 1318   EOSABS 0.1 07/11/2023 1318   BASOSABS 0.0 07/11/2023 1318    Baseline Immunosuppressant Therapy Labs TB GOLD    Latest Ref Rng & Units 03/02/2023    1:24  PM  Quantiferon TB Gold  Quantiferon TB Gold Plus Negative Negative    Hepatitis Panel    Latest Ref Rng & Units 02/26/2019   10:40 AM  Hepatitis  Hep B Surface Ag NON-REACTI NON-REACTIVE   Hep B IgM NON-REACTI NON-REACTIVE    HIV Lab Results  Component Value Date   HIV Non Reactive 01/08/2019   Immunoglobulins    Latest Ref Rng & Units 02/26/2019   10:40 AM  Immunoglobulin Electrophoresis  IgA  70 - 320 mg/dL 161   IgG 096 - 0,454 mg/dL 0,981   IgM 50 - 191 mg/dL 478    SPEP    Latest Ref Rng & Units 07/11/2023    1:18 PM  Serum Protein Electrophoresis  Total Protein 6.0 - 8.5 g/dL 7.4    G9FA Lab Results  Component Value Date   G6PDH 14.3 02/26/2019   Chest x-ray: 06/11/2019 - No active cardiopulmonary disease.  Assessment/Plan:  Reviewed importance of holding ORENCIA with signs/symptoms of an infections, if antibiotics are prescribed to treat an active infection, and with invasive procedures  Demonstrated proper injection technique with Orencia Clickject demo device  Patient able to demonstrate proper injection technique using the teach back method.  Patient self injected in the right upper thigh with:  Pharmacy-supplied Medication:  NDC: [address]-41 Lot: [id number] Expiration: 09/2024  Patient tolerated well.  Observed for 30 mins in office for adverse reaction and none noted - patient denies .   Patient is to return in 1 month for labs and 6-8 weeks for follow-up appointment.  Standing orders for CBC/CMP placed.  TB gold will be monitored yearly (next due March 2025)  ORENCIA SQ approved through insurance .   Rx sent to: [facility]: [phone] .  Patient provided with pharmacy phone number and advised that they will call later to schedule shipment to home.  Patient will continue Orencia 125mg  subcut every 7 days in combination with leflunomide 20mg  daily. She will complete prednisone taper as prescribed  All questions  encouraged and answered.  Instructed patient to call with any further questions or concerns.  Chesley Mires, PharmD, MPH, BCPS, CPP Clinical Pharmacist (Rheumatology and Pulmonology)  08/01/2023 3:16 PM

## 2023-08-01 NOTE — Patient Instructions (Signed)
Your next ORENCIA dose is due on 08/11/23, 08/18/23, and every 7 days thereafter  CONTINUE LEFLUNOMIDE  20mg  once daily CONTINUE PREDNISONE taper as prescribed  HOLD ORENCIA and LEFLUNOMIDE if you have signs or symptoms of an infection. You can resume once you feel better or back to your baseline. HOLD ORENCIA and LEFLUNOMIDE if you start antibiotics to treat an infection. HOLD ORENCIA and LEFLUNOMIDE around the time of surgery/procedures. Your surgeon will be able to provide recommendations on when to hold BEFORE and when you are cleared to RESUME.  Pharmacy information: Your prescription will be shipped from [facility] specialty pharmacy. Their phone number is [phone] They will call to schedule shipment and confirm address. They will mail your medication to your home.  Labs are due in 1 month then every 3 months. Lab hours are from Monday to Thursday 8am-12:30pm and 1pm-5pm and Friday 8am-12pm. You do not need an appointment if you come for labs during these times.  How to manage an injection site reaction: Remember the 5 C's: COUNTER - leave on the counter at least 30 minutes but up to overnight to bring medication to room temperature. This may help prevent stinging COLD - place something cold (like an ice gel pack or cold water bottle) on the injection site just before cleansing with alcohol. This may help reduce pain CLARITIN - use Claritin (generic name is loratadine) for the first two weeks of treatment or the day of, the day before, and the day after injecting. This will help to minimize injection site reactions CORTISONE CREAM - apply if injection site is irritated and itching CALL ME - if injection site reaction is bigger than the size of your fist, looks infected, blisters, or if you develop hives

## 2023-08-04 ENCOUNTER — Ambulatory Visit: Payer: 59 | Attending: Rheumatology | Admitting: Pharmacist

## 2023-08-04 ENCOUNTER — Other Ambulatory Visit: Payer: Self-pay

## 2023-08-04 ENCOUNTER — Other Ambulatory Visit (HOSPITAL_COMMUNITY): Payer: Self-pay

## 2023-08-04 DIAGNOSIS — Z7189 Other specified counseling: Secondary | ICD-10-CM

## 2023-08-04 DIAGNOSIS — Z5181 Encounter for therapeutic drug level monitoring: Secondary | ICD-10-CM

## 2023-08-04 DIAGNOSIS — Z79899 Other long term (current) drug therapy: Secondary | ICD-10-CM

## 2023-08-04 DIAGNOSIS — M0579 Rheumatoid arthritis with rheumatoid factor of multiple sites without organ or systems involvement: Secondary | ICD-10-CM

## 2023-08-04 MED ORDER — ORENCIA CLICKJECT 125 MG/ML ~~LOC~~ SOAJ
125.0000 mg | SUBCUTANEOUS | 1 refills | Status: DC
Start: 2023-08-04 — End: 2023-10-11
  Filled 2023-08-04 – 2023-08-22 (×2): qty 4, 28d supply, fill #0
  Filled 2023-09-21: qty 4, 28d supply, fill #1

## 2023-08-11 ENCOUNTER — Telehealth: Payer: Self-pay | Admitting: *Deleted

## 2023-08-11 NOTE — Telephone Encounter (Signed)
Patient contacted the office stating that she was on Enbrel and it was discontinued due to her breaking out in a rash. Patient states her last Enbrel was 2 weeks ago. Patient states that she started the Orencia on 08/04/2023 and she is still breaking out in a rash. Patient states she has a dermatologist who advised her it is eczema. Patient does not think it is eczema. Patient advised the providers are out of the office until Monday. Patient advised to follow up with PCP, urgent care or dermatologist to have the rash evaluated.

## 2023-08-14 NOTE — Telephone Encounter (Signed)
Patient should see the dermatologist again.  We will continue Orencia for now as long as there is no infection.

## 2023-08-15 NOTE — Telephone Encounter (Signed)
Patient advised she should see the dermatologist again.  We will continue Orencia for now as long as there is no infection. Patient expressed understanding.

## 2023-08-15 NOTE — Telephone Encounter (Signed)
Attempted to contact the patient and left message for patient to call the office.  

## 2023-08-18 ENCOUNTER — Other Ambulatory Visit (HOSPITAL_COMMUNITY): Payer: Self-pay

## 2023-08-22 ENCOUNTER — Other Ambulatory Visit: Payer: Self-pay

## 2023-08-22 ENCOUNTER — Other Ambulatory Visit (HOSPITAL_COMMUNITY): Payer: Self-pay

## 2023-08-30 ENCOUNTER — Other Ambulatory Visit: Payer: Self-pay | Admitting: *Deleted

## 2023-08-30 DIAGNOSIS — Z79899 Other long term (current) drug therapy: Secondary | ICD-10-CM

## 2023-09-05 ENCOUNTER — Other Ambulatory Visit: Payer: 59

## 2023-09-06 ENCOUNTER — Telehealth: Payer: Self-pay | Admitting: Family Medicine

## 2023-09-06 LAB — CBC WITH DIFFERENTIAL/PLATELET
Basophils Absolute: 0 10*3/uL (ref 0.0–0.2)
Basos: 0 %
EOS (ABSOLUTE): 0.1 10*3/uL (ref 0.0–0.4)
Eos: 2 %
Hematocrit: 38.3 % (ref 34.0–46.6)
Hemoglobin: 12.2 g/dL (ref 11.1–15.9)
Immature Grans (Abs): 0 10*3/uL (ref 0.0–0.1)
Immature Granulocytes: 0 %
Lymphocytes Absolute: 1.9 10*3/uL (ref 0.7–3.1)
Lymphs: 29 %
MCH: 29.5 pg (ref 26.6–33.0)
MCHC: 31.9 g/dL (ref 31.5–35.7)
MCV: 93 fL (ref 79–97)
Monocytes Absolute: 0.2 10*3/uL (ref 0.1–0.9)
Monocytes: 2 %
Neutrophils Absolute: 4.2 10*3/uL (ref 1.4–7.0)
Neutrophils: 67 %
Platelets: 328 10*3/uL (ref 150–450)
RBC: 4.14 x10E6/uL (ref 3.77–5.28)
RDW: 14.2 % (ref 11.7–15.4)
WBC: 6.4 10*3/uL (ref 3.4–10.8)

## 2023-09-06 LAB — CMP14+EGFR
ALT: 12 [IU]/L (ref 0–32)
AST: 19 [IU]/L (ref 0–40)
Albumin: 3.3 g/dL — ABNORMAL LOW (ref 3.9–4.9)
Alkaline Phosphatase: 61 [IU]/L (ref 44–121)
BUN/Creatinine Ratio: 15 (ref 12–28)
BUN: 17 mg/dL (ref 8–27)
Bilirubin Total: 0.4 mg/dL (ref 0.0–1.2)
CO2: 26 mmol/L (ref 20–29)
Calcium: 9.5 mg/dL (ref 8.7–10.3)
Chloride: 101 mmol/L (ref 96–106)
Creatinine, Ser: 1.11 mg/dL — ABNORMAL HIGH (ref 0.57–1.00)
Globulin, Total: 3.5 g/dL (ref 1.5–4.5)
Glucose: 111 mg/dL — ABNORMAL HIGH (ref 70–99)
Potassium: 4.2 mmol/L (ref 3.5–5.2)
Sodium: 142 mmol/L (ref 134–144)
Total Protein: 6.8 g/dL (ref 6.0–8.5)
eGFR: 54 mL/min/{1.73_m2} — ABNORMAL LOW (ref >59)

## 2023-09-06 NOTE — Progress Notes (Signed)
CBC is normal.  Creatinine is mildly elevated, most likely due to use of diuretics.  Please forward results to her PCP.

## 2023-09-12 ENCOUNTER — Telehealth: Payer: Self-pay | Admitting: *Deleted

## 2023-09-12 NOTE — Telephone Encounter (Signed)
Patient contacted the office and left message stating she gave herself an injection of Orencia on Thursday. Patient wanted to advise she has a quarter sized bruise.   Returned call to the patient. She states gave her injection in her thigh. Patient desnies any itching or rash at the injection site.

## 2023-09-13 NOTE — Telephone Encounter (Signed)
Per Sue Lush, nothing further needed from pharmacy team regarding this message  Chesley Mires, PharmD, MPH, BCPS, CPP Clinical Pharmacist (Rheumatology and Pulmonology)

## 2023-09-21 ENCOUNTER — Other Ambulatory Visit: Payer: Self-pay

## 2023-09-21 NOTE — Progress Notes (Signed)
Specialty Pharmacy Refill Coordination Note  Jean Howell is a 67 y.o. female contacted today regarding refills of specialty medication(s) Abatacept   Patient requested Delivery   Delivery date: 09/23/23   Verified address: [address]   [place] [id number]   Medication will be filled on 09/22/23.

## 2023-09-22 ENCOUNTER — Other Ambulatory Visit: Payer: Self-pay

## 2023-09-22 ENCOUNTER — Other Ambulatory Visit: Payer: Self-pay | Admitting: Family Medicine

## 2023-09-22 DIAGNOSIS — M792 Neuralgia and neuritis, unspecified: Secondary | ICD-10-CM

## 2023-09-22 DIAGNOSIS — I10 Essential (primary) hypertension: Secondary | ICD-10-CM

## 2023-09-22 DIAGNOSIS — M5432 Sciatica, left side: Secondary | ICD-10-CM

## 2023-10-04 ENCOUNTER — Telehealth: Payer: Self-pay | Admitting: Family Medicine

## 2023-10-04 DIAGNOSIS — I10 Essential (primary) hypertension: Secondary | ICD-10-CM

## 2023-10-04 DIAGNOSIS — M792 Neuralgia and neuritis, unspecified: Secondary | ICD-10-CM

## 2023-10-04 DIAGNOSIS — M5432 Sciatica, left side: Secondary | ICD-10-CM

## 2023-10-04 MED ORDER — GABAPENTIN 400 MG PO CAPS: 400.0000 mg | ORAL_CAPSULE | Freq: Every day | ORAL | 0 refills | Status: DC

## 2023-10-04 MED ORDER — LOSARTAN POTASSIUM-HCTZ 100-12.5 MG PO TABS
ORAL_TABLET | ORAL | 0 refills | Status: DC
Start: 2023-10-04 — End: 2023-10-24

## 2023-10-04 NOTE — Telephone Encounter (Signed)
  Prescription Request  10/04/2023  Is this a "Controlled Substance" medicine? no  Have you seen your PCP in the last 2 weeks? No pt has appt on 01/03/2024  If YES, route message to pool  -  If NO, patient needs to be scheduled for appointment.  What is the name of the medication or equipment? gabapentin (NEURONTIN) 400 MG capsule  losartan-hydrochlorothiazide (HYZAAR) 100-12.5 MG tablet   Have you contacted your pharmacy to request a refill? yes   Which pharmacy would you like [facility] sent [facility]    Patient notified that their request is being sent to the clinical staff for review and that they should receive a response within 2 business days.

## 2023-10-11 ENCOUNTER — Other Ambulatory Visit: Payer: Self-pay

## 2023-10-11 ENCOUNTER — Other Ambulatory Visit: Payer: Self-pay | Admitting: Physician Assistant

## 2023-10-11 ENCOUNTER — Other Ambulatory Visit (HOSPITAL_COMMUNITY): Payer: Self-pay

## 2023-10-11 ENCOUNTER — Other Ambulatory Visit (HOSPITAL_COMMUNITY): Payer: Self-pay | Admitting: Pharmacy Technician

## 2023-10-11 DIAGNOSIS — M0579 Rheumatoid arthritis with rheumatoid factor of multiple sites without organ or systems involvement: Secondary | ICD-10-CM

## 2023-10-11 DIAGNOSIS — Z79899 Other long term (current) drug therapy: Secondary | ICD-10-CM

## 2023-10-11 MED ORDER — ORENCIA CLICKJECT 125 MG/ML ~~LOC~~ SOAJ
125.0000 mg | SUBCUTANEOUS | 2 refills | Status: DC
  Filled 2023-10-12: qty 4, 28d supply, fill #0
  Filled 2023-11-17: qty 4, 28d supply, fill #1
  Filled 2023-12-14: qty 4, 28d supply, fill #2

## 2023-10-11 NOTE — Progress Notes (Signed)
Specialty Pharmacy Refill Coordination Note  Jean Howell is a 67 y.o. female contacted today regarding refills of specialty medication(s) Abatacept   Patient requested Delivery   Delivery date: 10/25/23   Verified address: [address]  [place]   Medication will be filled on 10/24/23.  Refill Request sent to MD; Call if any delays

## 2023-10-11 NOTE — Telephone Encounter (Signed)
Last Fill: 08/04/2023  Labs: 09/05/2023 CBC is normal.  Creatinine is mildly elevated, most likely due to use of diuretics.     TB Gold: 03/02/2023 Neg    Next Visit: 10/12/2023  Last Visit: 07/20/2023  [id number] arthritis involving multiple sites with positive rheumatoid factor   Current Dose per office note 08/04/2023: Orencia 125mg  subcut every 7 days   Okay to refill Orencia?

## 2023-10-12 ENCOUNTER — Ambulatory Visit: Payer: 59 | Attending: Rheumatology | Admitting: Rheumatology

## 2023-10-12 ENCOUNTER — Other Ambulatory Visit: Payer: Self-pay

## 2023-10-12 ENCOUNTER — Encounter: Payer: Self-pay | Admitting: Rheumatology

## 2023-10-12 VITALS — BP 115/81 | HR 96 | Resp 14 | Ht 63.0 in | Wt 225.8 lb

## 2023-10-12 DIAGNOSIS — Z79899 Other long term (current) drug therapy: Secondary | ICD-10-CM | POA: Diagnosis not present

## 2023-10-12 DIAGNOSIS — Z96651 Presence of right artificial knee joint: Secondary | ICD-10-CM

## 2023-10-12 DIAGNOSIS — M0579 Rheumatoid arthritis with rheumatoid factor of multiple sites without organ or systems involvement: Secondary | ICD-10-CM | POA: Diagnosis not present

## 2023-10-12 DIAGNOSIS — Z800 Family history of malignant neoplasm of digestive organs: Secondary | ICD-10-CM | POA: Diagnosis not present

## 2023-10-12 DIAGNOSIS — R21 Rash and other nonspecific skin eruption: Secondary | ICD-10-CM

## 2023-10-12 DIAGNOSIS — M1712 Unilateral primary osteoarthritis, left knee: Secondary | ICD-10-CM

## 2023-10-12 DIAGNOSIS — R5383 Other fatigue: Secondary | ICD-10-CM

## 2023-10-12 DIAGNOSIS — Z8349 Family history of other endocrine, nutritional and metabolic diseases: Secondary | ICD-10-CM | POA: Diagnosis not present

## 2023-10-12 DIAGNOSIS — R768 Other specified abnormal immunological findings in serum: Secondary | ICD-10-CM | POA: Diagnosis not present

## 2023-10-12 DIAGNOSIS — Z87891 Personal history of nicotine dependence: Secondary | ICD-10-CM

## 2023-10-12 DIAGNOSIS — I10 Essential (primary) hypertension: Secondary | ICD-10-CM

## 2023-10-12 DIAGNOSIS — Z8659 Personal history of other mental and behavioral disorders: Secondary | ICD-10-CM

## 2023-10-12 NOTE — Patient Instructions (Signed)
Standing Labs We placed an order today for your standing lab work.   Please have your standing labs drawn in December and every 3 months  Please have your labs drawn 2 weeks prior to your appointment so that the provider can discuss your lab results at your appointment, if possible.  Please note that you may see your imaging and lab results in MyChart before we have reviewed them. We will contact you once all results are reviewed. Please allow our office up to 72 hours to thoroughly review all of the results before contacting the office for clarification of your results.  WALK-IN LAB HOURS  Monday through Thursday from 8:00 am -12:30 pm and 1:00 pm-5:00 pm and Friday from 8:00 am-12:00 pm.  Patients with office visits requiring labs will be seen before walk-in labs.  You may encounter longer than normal wait times. Please allow additional time. Wait times may be shorter on  Monday and Thursday afternoons.  We do not book appointments for walk-in labs. We appreciate your patience and understanding with our staff.   Labs are drawn by [facility]. Please bring your co-pay at the time of your lab draw.  You may receive a bill from [facility] for your lab work.  Please note if you are on Hydroxychloroquine and and an order has been placed for a Hydroxychloroquine level,  you will need to have it drawn 4 hours or more after your last dose.  If you wish to have your labs drawn at another location, please call the office 24 hours in advance so we can fax the orders.  The office is located at [address], [address], [place], [place] [address]   If you have any questions regarding directions or hours of operation,  please call [phone].   As a reminder, please drink plenty of water prior to coming for your lab work. Thanks!   Vaccines You are taking a medication(s) that can suppress your immune system.  The following immunizations are recommended: Flu annually Covid-19  RSV Td/Tdap (tetanus,  diphtheria, pertussis) every 10 years Pneumonia (Prevnar 15 then Pneumovax 23 at least 1 year apart.  Alternatively, can take Prevnar 20 without needing additional dose) Shingrix: 2 doses from 4 weeks to 6 months apart  Please check with your PCP to make sure you are up to date.  If you have signs or symptoms of an infection or start antibiotics: First, call your PCP for workup of your infection. Hold your medication through the infection, until you complete your antibiotics, and until symptoms resolve if you take the following: Injectable medication (Actemra, Benlysta, Cimzia, Cosentyx, Enbrel, Humira, Kevzara, Orencia, Remicade, Simponi, Stelara, Taltz, Tremfya) Methotrexate Leflunomide (Arava) Mycophenolate (Cellcept) Harriette Ohara, Olumiant, or Rinvoq

## 2023-10-22 ENCOUNTER — Other Ambulatory Visit: Payer: Self-pay | Admitting: Family Medicine

## 2023-10-22 ENCOUNTER — Other Ambulatory Visit: Payer: Self-pay | Admitting: Rheumatology

## 2023-10-22 DIAGNOSIS — M5432 Sciatica, left side: Secondary | ICD-10-CM

## 2023-10-22 DIAGNOSIS — I10 Essential (primary) hypertension: Secondary | ICD-10-CM

## 2023-10-22 DIAGNOSIS — M792 Neuralgia and neuritis, unspecified: Secondary | ICD-10-CM

## 2023-10-24 NOTE — Telephone Encounter (Signed)
Last Fill: 07/22/2023  Labs: 09/05/2023 CBC is normal.  Creatinine is mildly elevated, most likely due to use of diuretics.   Next Visit: 03/14/2024  Last Visit: 10/12/2023  DX: Rheumatoid arthritis involving multiple sites with positive rheumatoid factor   Current Dose per office note 10/12/2023: Arava 20 mg 1 tablet by mouth daily   Okay to refill Arava ?

## 2023-11-02 ENCOUNTER — Other Ambulatory Visit: Payer: Self-pay | Admitting: *Deleted

## 2023-11-02 DIAGNOSIS — Z79899 Other long term (current) drug therapy: Secondary | ICD-10-CM

## 2023-11-07 ENCOUNTER — Other Ambulatory Visit: Payer: 59

## 2023-11-15 ENCOUNTER — Other Ambulatory Visit: Payer: Self-pay

## 2023-11-17 ENCOUNTER — Other Ambulatory Visit (HOSPITAL_COMMUNITY): Payer: Self-pay | Admitting: Pharmacy Technician

## 2023-11-17 ENCOUNTER — Other Ambulatory Visit (HOSPITAL_COMMUNITY): Payer: Self-pay

## 2023-11-17 NOTE — Progress Notes (Signed)
Specialty Pharmacy Refill Coordination Note  Jean Howell is a 67 y.o. female contacted today regarding refills of specialty medication(s) Abatacept (Orencia ClickJect)   Patient requested Delivery   Delivery date: 11/22/23   Verified address: [address] [place]   Medication will be filled on 11/21/23.

## 2023-11-21 ENCOUNTER — Other Ambulatory Visit: Payer: Self-pay

## 2023-12-05 ENCOUNTER — Other Ambulatory Visit: Payer: 59

## 2023-12-05 DIAGNOSIS — Z79899 Other long term (current) drug therapy: Secondary | ICD-10-CM

## 2023-12-06 LAB — CBC WITH DIFFERENTIAL/PLATELET
Basophils Absolute: 0 10*3/uL (ref 0.0–0.2)
Basos: 0 %
EOS (ABSOLUTE): 0.1 10*3/uL (ref 0.0–0.4)
Eos: 2 %
Hematocrit: 36.6 % (ref 34.0–46.6)
Hemoglobin: 11.8 g/dL (ref 11.1–15.9)
Immature Grans (Abs): 0 10*3/uL (ref 0.0–0.1)
Immature Granulocytes: 0 %
Lymphocytes Absolute: 2 10*3/uL (ref 0.7–3.1)
Lymphs: 47 %
MCH: 28 pg (ref 26.6–33.0)
MCHC: 32.2 g/dL (ref 31.5–35.7)
MCV: 87 fL (ref 79–97)
Monocytes Absolute: 0.1 10*3/uL (ref 0.1–0.9)
Monocytes: 3 %
Neutrophils Absolute: 2.2 10*3/uL (ref 1.4–7.0)
Neutrophils: 48 %
Platelets: 322 10*3/uL (ref 150–450)
RBC: 4.21 x10E6/uL (ref 3.77–5.28)
RDW: 14.3 % (ref 11.7–15.4)
WBC: 4.4 10*3/uL (ref 3.4–10.8)

## 2023-12-06 LAB — CMP14+EGFR
ALT: 16 IU/L (ref 0–32)
AST: 22 IU/L (ref 0–40)
Albumin: 3.5 g/dL — ABNORMAL LOW (ref 3.9–4.9)
Alkaline Phosphatase: 67 IU/L (ref 44–121)
BUN/Creatinine Ratio: 11 — ABNORMAL LOW (ref 12–28)
BUN: 13 mg/dL (ref 8–27)
Bilirubin Total: 0.4 mg/dL (ref 0.0–1.2)
CO2: 24 mmol/L (ref 20–29)
Calcium: 9.8 mg/dL (ref 8.7–10.3)
Chloride: 100 mmol/L (ref 96–106)
Creatinine, Ser: 1.16 mg/dL — ABNORMAL HIGH (ref 0.57–1.00)
Globulin, Total: 3.5 g/dL (ref 1.5–4.5)
Glucose: 103 mg/dL — ABNORMAL HIGH (ref 70–99)
Potassium: 4.1 mmol/L (ref 3.5–5.2)
Sodium: 140 mmol/L (ref 134–144)
Total Protein: 7 g/dL (ref 6.0–8.5)
eGFR: 51 mL/min/{1.73_m2} — ABNORMAL LOW (ref >59)

## 2023-12-06 NOTE — Progress Notes (Signed)
CBC WNL  Creatinine is elevated-1.16 and GFR-51. Avoid the use of NSAIDs. Has she been taking NSAIDs? Albumin is borderline low.

## 2023-12-06 NOTE — Progress Notes (Signed)
Any other medication changes? Recommend evaluation by PCP if no medication changes have been made and she has not been taking any NSAIDs

## 2023-12-14 ENCOUNTER — Other Ambulatory Visit: Payer: Self-pay

## 2023-12-14 NOTE — Progress Notes (Signed)
 Specialty Pharmacy Refill Coordination Note  Jean Howell is a 68 y.o. female contacted today regarding refills of specialty medication(s) Abatacept  (Orencia  ClickJect)   Patient requested Delivery   Delivery date: 12/16/23   Verified address: [address]   [place] [phone]   Medication will be filled on 12/15/23.

## 2024-01-03 ENCOUNTER — Encounter: Payer: Self-pay | Admitting: Family Medicine

## 2024-01-03 ENCOUNTER — Other Ambulatory Visit: Payer: Self-pay

## 2024-01-03 ENCOUNTER — Other Ambulatory Visit: Payer: 59

## 2024-01-03 ENCOUNTER — Ambulatory Visit: Payer: 59 | Admitting: Family Medicine

## 2024-01-03 ENCOUNTER — Other Ambulatory Visit: Payer: Self-pay | Admitting: Rheumatology

## 2024-01-03 VITALS — BP 112/72 | HR 98 | Temp 97.9°F | Ht 63.0 in | Wt 215.0 lb

## 2024-01-03 DIAGNOSIS — Z79899 Other long term (current) drug therapy: Secondary | ICD-10-CM

## 2024-01-03 DIAGNOSIS — N1831 Chronic kidney disease, stage 3a: Secondary | ICD-10-CM | POA: Diagnosis not present

## 2024-01-03 DIAGNOSIS — R55 Syncope and collapse: Secondary | ICD-10-CM | POA: Diagnosis not present

## 2024-01-03 DIAGNOSIS — R739 Hyperglycemia, unspecified: Secondary | ICD-10-CM

## 2024-01-03 DIAGNOSIS — M5432 Sciatica, left side: Secondary | ICD-10-CM

## 2024-01-03 DIAGNOSIS — I10 Essential (primary) hypertension: Secondary | ICD-10-CM

## 2024-01-03 DIAGNOSIS — E7800 Pure hypercholesterolemia, unspecified: Secondary | ICD-10-CM | POA: Diagnosis not present

## 2024-01-03 DIAGNOSIS — E7849 Other hyperlipidemia: Secondary | ICD-10-CM | POA: Diagnosis not present

## 2024-01-03 DIAGNOSIS — M792 Neuralgia and neuritis, unspecified: Secondary | ICD-10-CM

## 2024-01-03 DIAGNOSIS — M0579 Rheumatoid arthritis with rheumatoid factor of multiple sites without organ or systems involvement: Secondary | ICD-10-CM

## 2024-01-03 LAB — BAYER DCA HB A1C WAIVED: HB A1C (BAYER DCA - WAIVED): 4.8 % (ref 4.8–5.6)

## 2024-01-03 MED ORDER — GABAPENTIN 600 MG PO TABS: 600.0000 mg | ORAL_TABLET | Freq: Every day | ORAL | 3 refills | Status: DC

## 2024-01-03 MED ORDER — ORENCIA CLICKJECT 125 MG/ML ~~LOC~~ SOAJ
125.0000 mg | SUBCUTANEOUS | 2 refills | Status: DC
  Filled 2024-01-03: qty 4, 28d supply, fill #0
  Filled 2024-02-03: qty 4, 28d supply, fill #1
  Filled 2024-03-02: qty 4, 28d supply, fill #2

## 2024-01-03 MED ORDER — LOSARTAN POTASSIUM-HCTZ 100-12.5 MG PO TABS: ORAL_TABLET | ORAL | 3 refills | Status: DC

## 2024-01-03 MED ORDER — METHOCARBAMOL 500 MG PO TABS: 500.0000 mg | ORAL_TABLET | Freq: Two times a day (BID) | ORAL | 1 refills | Status: DC | PRN

## 2024-01-03 MED ORDER — ROSUVASTATIN CALCIUM 10 MG PO TABS: 10.0000 mg | ORAL_TABLET | Freq: Every day | ORAL | 3 refills | Status: DC

## 2024-01-03 NOTE — Progress Notes (Signed)
Subjective: CC: Low back pain PCP: Raliegh Ip, DO [id number] [name] is a 68 y.o. female presenting to clinic today for:  1.  Left low back pain Patient with some exacerbation of left-sided low back pain.  It does run down the left lower extremity.  She has had history with sciatica in the past.  She has been taking gabapentin 400 mg at bedtime but wanted to know if perhaps the dose could be increased to help with this.  Not utilizing any muscle relaxers.  She continues to be treated for rheumatoid arthritis with immunosuppressants.  2.  Hypertension with hyperlipidemia. Compliant with medications.  No chest pain, shortness of breath reported.  She did have another episode of recurrent syncope last July.  She notes that she had gotten overheated in the bathroom and cannot cooldown enough.  Of note this was evaluated last January With cardiology as well   ROS: Per HPI  Allergies  Allergen Reactions   Enbrel [Etanercept] Rash    Psoriasisform   Past Medical History:  Diagnosis Date   Arthritis    Depression    Hypertension    Rheumatoid arthritis (HCC)     Current Outpatient Medications:    Abatacept (ORENCIA CLICKJECT) 125 MG/ML SOAJ, Inject 125 mg into the skin once a week., Disp: 4 mL, Rfl: 2   betamethasone dipropionate (DIPROLENE) 0.05 % ointment, Apply topically daily., Disp: , Rfl:    Calcium-Magnesium-Zinc (CAL-MAG-ZINC PO), Take 1 tablet by mouth daily., Disp: , Rfl:    cyanocobalamin (VITAMIN B12) 1000 MCG tablet, Take 1 tablet by mouth daily., Disp: , Rfl:    Ergocalciferol (VITAMIN D2) 10 MCG (400 UNIT) TABS, Take 1 tablet by mouth daily., Disp: , Rfl:    fluticasone (FLONASE) 50 MCG/ACT nasal spray, USE 2 SPRAYS IN EACH NOSTRIL DAILY, Disp: 16 g, Rfl: 6   gabapentin (NEURONTIN) 400 MG capsule, TAKE ONE CAPSULE BY MOUTH AT BEDTIME, Disp: 90 capsule, Rfl: 0   ketoconazole (NIZORAL) 2 % shampoo, Lather affected areas and leave on for 10 mins. Then rinse.  Use  twice weekly until rash resolves., Disp: 120 mL, Rfl: 2   leflunomide (ARAVA) 20 MG tablet, TAKE ONE (1) TABLET BY MOUTH EVERY DAY, Disp: 90 tablet, Rfl: 0   levocetirizine (XYZAL) 5 MG tablet, Take 1 tablet (5 mg total) by mouth at bedtime as needed for allergies (or itching)., Disp: 90 tablet, Rfl: 3   lidocaine (LIDODERM) 5 %, Place 1 patch onto the skin daily. Remove & Discard patch within 12 hours or as directed by MD (Patient not taking: Reported on 07/20/2023), Disp: 30 patch, Rfl: PRN   losartan-hydrochlorothiazide (HYZAAR) 100-12.5 MG tablet, TAKE ONE (1) TABLET BY MOUTH EVERY DAY, Disp: 90 tablet, Rfl: 0   methocarbamol (ROBAXIN-750) 750 MG tablet, Take 1 tablet (750 mg total) by mouth 2 (two) times daily as needed for muscle spasms., Disp: 20 tablet, Rfl: 2   Multiple Vitamin (MULTIVITAMIN PO), Take 1 tablet by mouth daily., Disp: , Rfl:    ondansetron (ZOFRAN) 4 MG tablet, Take 1 tablet (4 mg total) by mouth every 8 (eight) hours as needed for nausea or vomiting., Disp: 40 tablet, Rfl: 0   oxyCODONE-acetaminophen (PERCOCET/ROXICET) 5-325 MG tablet, 1 tablet every 4 (four) hours as needed for up to 20 doses., Disp: , Rfl:    predniSONE (DELTASONE) 5 MG tablet, Take 4 tabs po qd x 4 days, 3  tabs po qd x 4 days, 2  tabs po qd x 4  days, 1  tab po qd x 4 days (Patient not taking: Reported on 10/12/2023), Disp: 40 tablet, Rfl: 0   rosuvastatin (CRESTOR) 10 MG tablet, Take 1 tablet (10 mg total) by mouth daily., Disp: 90 tablet, Rfl: 3   Suvorexant (BELSOMRA) 10 MG TABS, Take 10 mg by mouth at bedtime as needed (sleep). Place on hold, Disp: 30 tablet, Rfl: 3   VITAMIN E PO, Take by mouth daily., Disp: , Rfl:  Social History   Socioeconomic History   Marital status: Widowed    Spouse name: Not on file   Number of children: 1   Years of education: 12th   Highest education level: GED or equivalent  Occupational History   Occupation: retired  Tobacco Use   Smoking status: Former    Current  packs/day: 0.00    Average packs/day: 1 pack/day for 15.0 years (15.0 ttl pk-yrs)    Types: Cigarettes    Start date: 55    Quit date: 2000    Years since quitting: 25.0    Passive exposure: Past   Smokeless tobacco: Never  Vaping Use   Vaping status: Never Used  Substance and Sexual Activity   Alcohol use: Not Currently   Drug use: Never   Sexual activity: Not Currently    Comment: widowed  Other Topics Concern   Not on file  Social History Narrative   Patient is a widow that resides independently in [place].  She has 1 son, who resides in [place].   Social Drivers of Corporate investment banker Strain: Low Risk  (06/23/2023)   Overall Financial Resource Strain (CARDIA)    Difficulty of Paying Living Expenses: Not hard at all  Food Insecurity: No Food Insecurity (06/23/2023)   Hunger Vital Sign    Worried About Running Out of Food in the Last Year: Never true    Ran Out of Food in the Last Year: Never true  Transportation Needs: No Transportation Needs (06/23/2023)   PRAPARE - Administrator, Civil Service (Medical): No    Lack of Transportation (Non-Medical): No  Physical Activity: Inactive (09/20/2022)   Exercise Vital Sign    Days of Exercise per Week: 0 days    Minutes of Exercise per Session: 0 min  Stress: No Stress Concern Present (06/23/2023)   [employer] of Occupational Health - Occupational Stress Questionnaire    Feeling of Stress : Not at all  Social Connections: Socially Isolated (06/23/2023)   Social Connection and Isolation Panel [NHANES]    Frequency of Communication with Friends and Family: Twice a week    Frequency of Social Gatherings with Friends and Family: Once a week    Attends Religious Services: Never    Database administrator or Organizations: No    Attends Banker Meetings: Never    Marital Status: Widowed  Intimate Partner Violence: Not At Risk (06/23/2023)   Humiliation, Afraid, Rape, and Kick  questionnaire    Fear of Current or Ex-Partner: No    Emotionally Abused: No    Physically Abused: No    Sexually Abused: No   Family History  Problem Relation Age of Onset   Thyroid disease Mother    CVA Mother 9   Cancer Father    Colon cancer Father    Thyroid disease Sister    Congestive Heart Failure Sister    Hypertension Sister    Diabetes Sister    Hypertension Sister    Diabetes Sister  Hypertension Sister    Hypertension Sister    Cancer Brother        kidney    Healthy Son    Thyroid disease Niece    Breast cancer Neg Hx     Objective: Office vital signs reviewed. BP 112/72   Pulse 98   Temp 97.9 F (36.6 C)   Ht 5\' 3"  (1.6 m)   Wt 215 lb (97.5 kg)   SpO2 96%   BMI 38.09 kg/m   Physical Examination:  General: Awake, alert, morbidly obese, No acute distress HEENT: sclera white, MMM Cardio: regular rate and rhythm, S1S2 heard, no murmurs appreciated Pulm: clear to auscultation bilaterally, no wheezes, rhonchi or rales; normal work of breathing on room air MSK: antlagic gait and station.  No midline tenderness palpation to the lumbar spine.  She does have some left-sided tenderness palpation in the paraspinals and along the lumbosacral junction  Assessment/ Plan: 68 y.o. female   Sciatica of left side - Plan: gabapentin (NEURONTIN) 600 MG tablet, methocarbamol (ROBAXIN) 500 MG tablet, DISCONTINUED: methocarbamol (ROBAXIN) 500 MG tablet  Recurrent syncope  Essential hypertension - Plan: losartan-hydrochlorothiazide (HYZAAR) 100-12.5 MG tablet  Chronic kidney disease, stage 3a (HCC) - Plan: VITAMIN D 25 Hydroxy (Vit-D Deficiency, Fractures)  Neuralgia - Plan: gabapentin (NEURONTIN) 600 MG tablet  Elevated serum glucose - Plan: Bayer DCA Hb A1c Waived  Pure hypercholesterolemia - Plan: Lipid Panel, rosuvastatin (CRESTOR) 10 MG tablet  Gabapentin advance to 600 mg.  Lowered the dose of methocarbamol to use as needed only.  Caution sedation  Had  another episode of syncope in July of last year.  Again uncertain etiology as why this is happening.  Back in January 2024 it was felt to be perhaps secondary to dehydration.  However, upon further discussion I do question if maybe she is having vasovagal episodes due to overheating.  Her blood pressure was controlled today.  I am checking renal function.  Medications have been renewed.  Check vitamin D given known CKD 3A  A1c demonstrated no evidence of diabetes or prediabetes  Nonfasting lipid panel collected and rosuvastatin renewed.  Influenza vaccine administered.  Encouraged her to follow-up in 6 months for annual physical with fasting labs, sooner if concerns arise   Jean Garraway Hulen Skains, DO [facility] [phone]

## 2024-01-03 NOTE — Telephone Encounter (Signed)
Last Fill: 10/11/2023  Labs: 12/05/2023 CBC WNL Creatinine is elevated-1.16 and GFR-51.   TB Gold: 03/02/2023 Neg    Next Visit: 03/14/2024  Last Visit: 10/12/2023  NG:[id number] arthritis involving multiple sites with positive rheumatoid factor   Current Dose per office note 10/12/2023: Orencia 125mg  sq injections once weekly   Okay to refill Orencia?

## 2024-01-03 NOTE — Progress Notes (Signed)
Specialty Pharmacy Refill Coordination Note  Jean Howell is a 68 y.o. female contacted today regarding refills of specialty medication(s) Abatacept (Orencia ClickJect)   Patient requested Delivery   Delivery date: 01/11/24   Verified address: [address]   [place] [phone]   Medication will be filled on 01/10/24.

## 2024-01-04 ENCOUNTER — Encounter: Payer: Self-pay | Admitting: Family Medicine

## 2024-01-04 LAB — LIPID PANEL
Chol/HDL Ratio: 2.5 {ratio} (ref 0.0–4.4)
Cholesterol, Total: 92 mg/dL — ABNORMAL LOW (ref 100–199)
HDL: 37 mg/dL — ABNORMAL LOW (ref >39)
LDL Chol Calc (NIH): 33 mg/dL (ref 0–99)
Triglycerides: 123 mg/dL (ref 0–149)
VLDL Cholesterol Cal: 22 mg/dL (ref 5–40)

## 2024-01-04 LAB — VITAMIN D 25 HYDROXY (VIT D DEFICIENCY, FRACTURES): Vit D, 25-Hydroxy: 75.9 ng/mL (ref 30.0–100.0)

## 2024-01-17 ENCOUNTER — Other Ambulatory Visit: Payer: Self-pay | Admitting: Physician Assistant

## 2024-01-17 NOTE — Telephone Encounter (Signed)
Last Fill: 10/24/2023  Labs: 12/05/2023 CBC WNL Creatinine is elevated-1.16 and GFR-51. Avoid the use of NSAIDs. Has she been taking NSAIDs? Albumin is borderline low. Any other medication changes? Recommend evaluation by PCP if no medication changes have been made and she has not been taking any NSAIDs   Next Visit: 03/14/2024  Last Visit: 10/12/2023  DX:  Rheumatoid arthritis involving multiple sites with positive rheumatoid factor (HCC)   Current Dose per office note 10/12/2023: Arava 20 mg 1 tablet by mouth daily   Okay to refill Arava ?

## 2024-01-17 NOTE — Telephone Encounter (Signed)
Last Fill: 10/24/2023  Labs: 12/05/2023 CBC WNL Creatinine is elevated-1.16 and GFR-51. Albumin is borderline low.    Next Visit: 03/14/2024  Last Visit: 10/12/2023  DX: Rheumatoid arthritis involving multiple sites with positive rheumatoid factor   Current Dose per office note 10/12/2023: Arava 20 mg 1 tablet by mouth daily   Okay to refill Arava ?

## 2024-02-03 ENCOUNTER — Other Ambulatory Visit: Payer: Self-pay

## 2024-02-03 NOTE — Progress Notes (Signed)
 Specialty Pharmacy Ongoing Clinical Assessment Note  Jean Howell is a 68 y.o. female who is being followed by the specialty pharmacy service for RxSp Rheumatoid Arthritis   Patient's specialty medication(s) reviewed today: Abatacept (Orencia ClickJect)   Missed doses in the last 4 weeks: 0   Patient/Caregiver did not have any additional questions or concerns.   Therapeutic benefit summary: Patient is achieving benefit   Adverse events/side effects summary: No adverse events/side effects   Patient's therapy is appropriate to: Continue    Goals Addressed             This Visit's Progress    Reduce inflammation       Patient is on track. Patient will maintain adherence         Follow up:  6 months  Bobette Mo Specialty Pharmacist

## 2024-02-03 NOTE — Progress Notes (Signed)
 Specialty Pharmacy Refill Coordination Note  Jean Howell is a 68 y.o. female contacted today regarding refills of specialty medication(s) Abatacept (Orencia ClickJect)   Patient requested Delivery   Delivery date: 02/08/24   Verified address: [address]   [place] [phone]   Medication will be filled on 02/07/24.

## 2024-02-07 ENCOUNTER — Other Ambulatory Visit: Payer: Self-pay

## 2024-02-12 ENCOUNTER — Other Ambulatory Visit: Payer: Self-pay

## 2024-02-12 ENCOUNTER — Encounter (HOSPITAL_COMMUNITY): Payer: Self-pay | Admitting: Emergency Medicine

## 2024-02-12 ENCOUNTER — Emergency Department (HOSPITAL_COMMUNITY)
Admission: EM | Admit: 2024-02-12 | Discharge: 2024-02-12 | Disposition: A | Discharge status: Home / Self Care | Attending: Emergency Medicine | Admitting: Emergency Medicine

## 2024-02-12 DIAGNOSIS — I10 Essential (primary) hypertension: Secondary | ICD-10-CM | POA: Diagnosis not present

## 2024-02-12 DIAGNOSIS — Z743 Need for continuous supervision: Secondary | ICD-10-CM | POA: Diagnosis not present

## 2024-02-12 DIAGNOSIS — Z79899 Other long term (current) drug therapy: Secondary | ICD-10-CM | POA: Diagnosis not present

## 2024-02-12 DIAGNOSIS — R404 Transient alteration of awareness: Secondary | ICD-10-CM | POA: Diagnosis not present

## 2024-02-12 DIAGNOSIS — R55 Syncope and collapse: Secondary | ICD-10-CM | POA: Insufficient documentation

## 2024-02-12 DIAGNOSIS — R6889 Other general symptoms and signs: Secondary | ICD-10-CM | POA: Diagnosis not present

## 2024-02-12 DIAGNOSIS — I499 Cardiac arrhythmia, unspecified: Secondary | ICD-10-CM | POA: Diagnosis not present

## 2024-02-12 LAB — COMPREHENSIVE METABOLIC PANEL
ALT: 14 U/L (ref 0–44)
AST: 20 U/L (ref 15–41)
Albumin: 2.7 g/dL — ABNORMAL LOW (ref 3.5–5.0)
Alkaline Phosphatase: 52 U/L (ref 38–126)
Anion gap: 10 (ref 5–15)
BUN: 17 mg/dL (ref 8–23)
CO2: 25 mmol/L (ref 22–32)
Calcium: 8.8 mg/dL — ABNORMAL LOW (ref 8.9–10.3)
Chloride: 102 mmol/L (ref 98–111)
Creatinine, Ser: 1.07 mg/dL — ABNORMAL HIGH (ref 0.44–1.00)
GFR, Estimated: 56 mL/min — ABNORMAL LOW (ref >60)
Glucose, Bld: 119 mg/dL — ABNORMAL HIGH (ref 70–99)
Potassium: 3.5 mmol/L (ref 3.5–5.1)
Sodium: 137 mmol/L (ref 135–145)
Total Bilirubin: 0.4 mg/dL (ref 0.0–1.2)
Total Protein: 7.4 g/dL (ref 6.5–8.1)

## 2024-02-12 LAB — CBC WITH DIFFERENTIAL/PLATELET
Abs Immature Granulocytes: 0.02 10*3/uL (ref 0.00–0.07)
Basophils Absolute: 0 10*3/uL (ref 0.0–0.1)
Basophils Relative: 0 %
Eosinophils Absolute: 0.1 10*3/uL (ref 0.0–0.5)
Eosinophils Relative: 1 %
HCT: 35.4 % — ABNORMAL LOW (ref 36.0–46.0)
Hemoglobin: 11.3 g/dL — ABNORMAL LOW (ref 12.0–15.0)
Immature Granulocytes: 0 %
Lymphocytes Relative: 17 %
Lymphs Abs: 1 10*3/uL (ref 0.7–4.0)
MCH: 28 pg (ref 26.0–34.0)
MCHC: 31.9 g/dL (ref 30.0–36.0)
MCV: 87.6 fL (ref 80.0–100.0)
Monocytes Absolute: 0.1 10*3/uL (ref 0.1–1.0)
Monocytes Relative: 2 %
Neutro Abs: 4.8 10*3/uL (ref 1.7–7.7)
Neutrophils Relative %: 80 %
Platelets: 335 10*3/uL (ref 150–400)
RBC: 4.04 MIL/uL (ref 3.87–5.11)
RDW: 16.9 % — ABNORMAL HIGH (ref 11.5–15.5)
WBC: 6 10*3/uL (ref 4.0–10.5)
nRBC: 0 % (ref 0.0–0.2)

## 2024-02-12 NOTE — ED Provider Notes (Signed)
 [place] EMERGENCY DEPARTMENT AT [facility] Provider Note   CSN: [id number] Arrival date & time: 02/12/24  1750     History  Chief Complaint  Patient presents with   Loss of Consciousness    Jean Howell is a 67 y.o. female.   Loss of Consciousness  This patient is a 68 year old female, she reports that she has a history of prior syncopal episodes, multiple times, has been seen by her family doctor for this, has been worked up without obvious answers.  She states that every time it happens she starts to feel very hot, then she has a syncopal episode when she comes around and feels back to her self.  She does not have chest pain palpitations headache numbness weakness or any other symptoms.  She has not been having vomiting, diarrhea, rashes, swelling, dysuria or any other complaints.  The patient had another episode today while she was at a party with her family, she started to feel hot, she was sitting down, she alerted her family members who helped her to sit in the chair without falling to the ground.  She was unresponsive for a minute or 2 before she came around, there was no seizure activity, she currently has no complaints.    Home Medications Prior to Admission medications   Medication Sig Start Date End Date Taking? Authorizing Provider  Abatacept (ORENCIA CLICKJECT) 125 MG/ML SOAJ Inject 125 mg into the skin once a week. 01/03/24   Gearldine Bienenstock, PA-C  betamethasone dipropionate (DIPROLENE) 0.05 % ointment Apply topically daily.    [provider]  Calcium-Magnesium-Zinc (CAL-MAG-ZINC PO) Take 1 tablet by mouth daily.    [provider]  cyanocobalamin (VITAMIN B12) 1000 MCG tablet Take 1 tablet by mouth daily. 12/16/21   [provider]  Ergocalciferol (VITAMIN D2) 10 MCG (400 UNIT) TABS Take 1 tablet by mouth daily. 12/16/21   [provider]  fluticasone (FLONASE) 50 MCG/ACT nasal spray USE 2 SPRAYS IN EACH NOSTRIL DAILY  04/18/23   Delynn Flavin M, DO  gabapentin (NEURONTIN) 600 MG tablet Take 1 tablet (600 mg total) by mouth at bedtime. **dose change 01/03/24   Delynn Flavin M, DO  ketoconazole (NIZORAL) 2 % shampoo Lather affected areas and leave on for 10 mins. Then rinse.  Use twice weekly until rash resolves. 02/08/23   Delynn Flavin M, DO  leflunomide (ARAVA) 20 MG tablet TAKE ONE (1) TABLET BY MOUTH EVERY DAY 01/17/24   Gearldine Bienenstock, PA-C  levocetirizine (XYZAL) 5 MG tablet Take 1 tablet (5 mg total) by mouth at bedtime as needed for allergies (or itching). 02/08/23   Raliegh Ip, DO  lidocaine (LIDODERM) 5 % Place 1 patch onto the skin daily. Remove & Discard patch within 12 hours or as directed by MD 11/08/22   Raliegh Ip, DO  losartan-hydrochlorothiazide (HYZAAR) 100-12.5 MG tablet TAKE ONE (1) TABLET BY MOUTH EVERY DAY 01/03/24   Delynn Flavin M, DO  methocarbamol (ROBAXIN) 500 MG tablet Take 1 tablet (500 mg total) by mouth 2 (two) times daily as needed for muscle spasms. 01/03/24   Raliegh Ip, DO  Multiple Vitamin (MULTIVITAMIN PO) Take 1 tablet by mouth daily.    [provider]  ondansetron (ZOFRAN) 4 MG tablet Take 1 tablet (4 mg total) by mouth every 8 (eight) hours as needed for nausea or vomiting. 06/28/22   Cristie Hem, PA-C  oxyCODONE-acetaminophen (PERCOCET/ROXICET) 5-325 MG tablet 1 tablet every 4 (four) hours  as needed for up to 20 doses. 01/06/22   [provider]  rosuvastatin (CRESTOR) 10 MG tablet Take 1 tablet (10 mg total) by mouth daily. 01/03/24   Raliegh Ip, DO  Suvorexant (BELSOMRA) 10 MG TABS Take 10 mg by mouth at bedtime as needed (sleep). Place on hold 09/07/22   Delynn Flavin M, DO  VITAMIN E PO Take by mouth daily.    [provider]      Allergies    Enbrel [etanercept]    Review of Systems   Review of Systems  Cardiovascular:  Positive for syncope.  All other systems reviewed and are  negative.   Physical Exam Updated Vital Signs BP 113/83   Pulse 93   Temp 98.3 F (36.8 C) (Oral)   Resp 19   Ht 1.6 m (5\' 3" )   Wt 97.5 kg   SpO2 99%   BMI 38.08 kg/m  Physical Exam Vitals and nursing note reviewed.  Constitutional:      General: She is not in acute distress.    Appearance: She is well-developed.  HENT:     Head: Normocephalic and atraumatic.     Mouth/Throat:     Pharynx: No oropharyngeal exudate.  Eyes:     General: No scleral icterus.       Right eye: No discharge.        Left eye: No discharge.     Conjunctiva/sclera: Conjunctivae normal.     Pupils: Pupils are equal, round, and reactive to light.  Neck:     Thyroid: No thyromegaly.     Vascular: No JVD.  Cardiovascular:     Rate and Rhythm: Normal rate and regular rhythm.     Heart sounds: Normal heart sounds. No murmur heard.    No friction rub. No gallop.  Pulmonary:     Effort: Pulmonary effort is normal. No respiratory distress.     Breath sounds: Normal breath sounds. No wheezing or rales.  Abdominal:     General: Bowel sounds are normal. There is no distension.     Palpations: Abdomen is soft. There is no mass.     Tenderness: There is no abdominal tenderness.  Musculoskeletal:        General: No tenderness. Normal range of motion.     Cervical back: Normal range of motion and neck supple.  Lymphadenopathy:     Cervical: No cervical adenopathy.  Skin:    General: Skin is warm and dry.     Findings: No erythema or rash.  Neurological:     Mental Status: She is alert.     Coordination: Coordination normal.     Comments: Speech is clear, cranial nerves III through XII are intact, memory is intact, strength is normal in all 4 extremities including grips and strength at the bilateral thighs, knees and ankles to extention and flexion, sensation is intact to light touch and pinprick in all 4 extremities. Coordination as tested by finger-nose-finger is normal, no limb ataxia. Normal gait,  normal reflexes at the patellar tendons bilaterally  Psychiatric:        Behavior: Behavior normal.     ED Results / Procedures / Treatments   Labs (all labs ordered are listed, but only abnormal results are displayed) Labs Reviewed  CBC WITH DIFFERENTIAL/PLATELET - Abnormal; Notable for the following components:      Result Value   Hemoglobin 11.3 (*)    HCT 35.4 (*)    RDW 16.9 (*)  All other components within normal limits  COMPREHENSIVE METABOLIC PANEL - Abnormal; Notable for the following components:   Glucose, Bld 119 (*)    Creatinine, Ser 1.07 (*)    Calcium 8.8 (*)    Albumin 2.7 (*)    GFR, Estimated 56 (*)    All other components within normal limits    EKG EKG Interpretation Date/Time:  Sunday February 12 2024 18:31:35 EDT Ventricular Rate:  95 PR Interval:  136 QRS Duration:  109 QT Interval:  356 QTC Calculation: 448 R Axis:   46  Text Interpretation: Sinus rhythm Abnormal R-wave progression, early transition Borderline T abnormalities, diffuse leads Confirmed by Eber Hong ([id number]) on 02/12/2024 7:19:17 PM  Radiology No results found.  Procedures Procedures    Medications Ordered in ED Medications - No data to display  ED Course/ Medical Decision Making/ A&P                                 Medical Decision Making Amount and/or Complexity of Data Reviewed Labs: ordered. ECG/medicine tests: ordered.    This patient presents to the ED for concern of syncope, this involves an extensive number of treatment options, and is a complaint that carries with it a high risk of complications and morbidity.  The differential diagnosis includes recurrent syncope, could be vagal, unlikely to be cardiac given her workup in the past, does not have any neurologic symptoms or headache to suggest that this is neurologic or CNS related   Co morbidities that complicate the patient evaluation  Patient has diagnosis history of neuropathy on gabapentin, hypertension  on losartan hydrochlorothiazide and hypercholesterolemia on Crestor.   Additional history obtained:  Additional history obtained from medical record External records from outside source obtained and reviewed including family doctor records, no recent admissions to the hospital, multiple visits including cardiology for syncope, echocardiogram performed in February 2020 for about 13 months ago, it showed ejection fraction of 55 to 60%, grade 1 diastolic dysfunction   Lab Tests:  I Ordered, and personally interpreted labs.  The pertinent results include: This patient has a normal CBC, hemoglobin is unremarkable metabolic panel is unremarkable    Cardiac Monitoring: / EKG:  The patient was maintained on a cardiac monitor.  I personally viewed and interpreted the cardiac monitored which showed an underlying rhythm of: Cardiac monitoring with normal sinus rhythm, EKG with no ischemia   Problem List / ED Course / Critical interventions / Medication management  The patient was kept on a cardiac monitor throughout the emergency department stay.  She had no findings of ischemia no arrhythmias, no ectopy, ultimately this patient had no findings.  Workup was negative, reviewed the cardiology notes which were negative, I did follow-up and reach out to her cardiologist and have asked them to get her in for an appointment in an expedited manner.  The patient is agreeable to call first thing in the morning as well.  I have reviewed the patients home medicines and have made adjustments as needed   Social Determinants of Health:  None   Test / Admission - Considered:  Considered admission but this is a recurring problem for this patient and there is been no arrhythmias on the monitor, she is agreeable to follow-up in the outpatient setting         Final Clinical Impression(s) / ED Diagnoses Final diagnoses:  Syncope, unspecified syncope type    Rx /  DC Orders ED Discharge Orders      None         Eber Hong, MD 02/12/24 2111

## 2024-02-12 NOTE — ED Triage Notes (Signed)
 Pt had witnessed syncopal lasting about . Pt states prior to passing out he felt real hot and dizzy. Pt denies any complaints at time.

## 2024-02-12 NOTE — Discharge Instructions (Signed)
 I would like for you to follow-up with your cardiologist, I have sent them a message so that you can hopefully expedite this follow-up but you need to call them tomorrow morning to make the next available appointment.  You should be seen within the week.  ER for severe worsening symptoms

## 2024-02-14 ENCOUNTER — Telehealth: Payer: Self-pay

## 2024-02-14 DIAGNOSIS — R55 Syncope and collapse: Secondary | ICD-10-CM

## 2024-02-14 DIAGNOSIS — Z4509 Encounter for adjustment and management of other cardiac device: Secondary | ICD-10-CM

## 2024-02-14 NOTE — Telephone Encounter (Signed)
Patient informed and patient verbalized understanding

## 2024-02-14 NOTE — Telephone Encounter (Signed)
-----   Message from Vishnu P Mallipeddi sent at 02/13/2024  9:18 AM EDT ----- Regarding: RE: f/u syncope Hello Jean Howell, yes will make a closer follow-up. She is due for one year follow-up visit already. I reviewed ER note. Description fits vasovagal etiology but agree with prolonged cardiac monitoring to r/o any concomitant conduction abnormalities.  Tionne Dayhoff, can you please schedule follow-up visit with me and place EP referral for loop recorder placement for recurrent syncope although her description of symptoms sound more vasovagal, need to rule out any concomitant conduction abnormalities. ----- Message ----- From: Eber Hong, MD Sent: 02/12/2024   9:09 PM EDT To: Marjo Bicker, MD Subject: f/u syncope                                    Hi Vishnu, I wanted to follow-up with you on Ms. Shawnie Pons who I saw in the emergency department tonight, she has evidently been having more frequent syncopal episodes, they have never found anything specific that is causing them and evidently she did wear some type of cardiac monitor for a couple of weeks but never had an episode while she was on it.  She had another episode tonight, no neurologic findings, workup negative, wanted to make sure she was on your radar for close follow-up as she probably needs a more prolonged cardiac monitoring evaluation.  Thanks and if you would not mind having the office reach out to her for close follow-up, have a great day  Jean Howell

## 2024-02-14 NOTE — Addendum Note (Signed)
 Addended by: Carmelina Paddock on: 02/14/2024 12:22 PM   Modules accepted: Orders

## 2024-02-14 NOTE — Telephone Encounter (Signed)
 Left message for patient to call back

## 2024-02-28 ENCOUNTER — Other Ambulatory Visit: Payer: Self-pay

## 2024-03-02 ENCOUNTER — Other Ambulatory Visit: Payer: Self-pay

## 2024-03-02 ENCOUNTER — Other Ambulatory Visit: Payer: Self-pay | Admitting: Pharmacy Technician

## 2024-03-02 NOTE — Progress Notes (Signed)
 Specialty Pharmacy Refill Coordination Note  Jean Howell is a 68 y.o. female contacted today regarding refills of specialty medication(s) Abatacept (Orencia ClickJect)   Patient requested Delivery   Delivery date: 03/13/24   Verified address: [address]  [place]   Medication will be filled on 03/12/24.

## 2024-03-05 NOTE — Progress Notes (Signed)
 Office Visit Note  Patient: Jean Howell             Date of Birth: [birth year]           MRN: [id number]             PCP: Raliegh Ip, DO Referring: Raliegh Ip, DO Visit Date: 03/14/2024 Occupation: @GUAROCC @  Subjective:  Pain in joints and muscles  History of Present Illness: Jean Howell is a 68 y.o. female with seropositive rheumatoid arthritis and osteoarthritis.  She returns today after last visit in November 2024.  She states that she continues to be on Orencia 125 mg subcu weekly along with Areva 20 mg p.o. daily without any interruption.  She continues to have joint stiffness and discomfort.  She describes discomfort in almost all of her joints and muscles.  She has not had a recurrence of rash since she has been on Orencia.  She also has been experiencing lower back pain.  She states she was evaluated by an orthopedic surgeon who advised that the back pain is related to arthritis and her knee joints.    Activities of Daily Living:  Patient reports morning stiffness for all day.  Patient Reports nocturnal pain.  Difficulty dressing/grooming: Reports Difficulty climbing stairs: Reports Difficulty getting out of chair: Reports Difficulty using hands for taps, buttons, cutlery, and/or writing: Reports  Review of Systems  Constitutional:  Positive for fatigue.  HENT:  Positive for mouth dryness. Negative for mouth sores.   Eyes:  Negative for pain and dryness.  Respiratory:  Negative for shortness of breath and difficulty breathing.   Cardiovascular:  Negative for chest pain and palpitations.  Gastrointestinal:  Negative for blood in stool, constipation and diarrhea.  Endocrine: Negative for increased urination.  Genitourinary:  Negative for involuntary urination.  Musculoskeletal:  Positive for joint pain, gait problem, joint pain, myalgias, morning stiffness, muscle tenderness and myalgias. Negative for joint swelling and muscle weakness.  Skin:   Positive for hair loss. Negative for color change, rash and sensitivity to sunlight.  Allergic/Immunologic: Negative for susceptible to infections.  Neurological:  Positive for light-headedness. Negative for dizziness and headaches.  Hematological:  Negative for swollen glands.  Psychiatric/Behavioral:  Positive for sleep disturbance. Negative for depressed mood. The patient is not nervous/anxious.     PMFS History:  Patient Active Problem List   Diagnosis Date Noted   Pigmented skin lesion of uncertain nature 02/08/2023   Syncope and collapse 12/27/2022   HLD (hyperlipidemia) 12/27/2022   Other intervertebral disc degeneration, lumbar region 11/18/2022   Status post total right knee replacement 07/05/2022   Primary osteoarthritis of right knee 06/10/2022   Osteopenia of neck of right femur 03/16/2022   Full code status 06/16/2020   Impaired mobility and endurance 06/16/2020   Sciatica of left side 06/16/2020   Rheumatoid arthritis involving multiple sites with positive rheumatoid factor (HCC) 03/06/2019   Primary osteoarthritis of both knees 03/06/2019   Former smoker 03/06/2019   ANA positive 02/12/2019   Family history of thyroid disease 01/08/2019   Morbid obesity (HCC) 01/08/2019   Essential hypertension 01/08/2019   Family history of colon cancer 01/08/2019    Past Medical History:  Diagnosis Date   Arthritis    Depression    Hypertension    Rheumatoid arthritis (HCC)     Family History  Problem Relation Age of Onset   Thyroid disease Mother    CVA Mother 6   Cancer Father    Colon  cancer Father    Thyroid disease Sister    Congestive Heart Failure Sister    Hypertension Sister    Diabetes Sister    Hypertension Sister    Diabetes Sister    Hypertension Sister    Hypertension Sister    Cancer Brother        kidney    Healthy Son    Thyroid disease Niece    Breast cancer Neg Hx    Past Surgical History:  Procedure Laterality Date   BREAST EXCISIONAL  BIOPSY Bilateral    pt unsure when- benign   BREAST SURGERY     nodules removed   COLONOSCOPY N/A 06/25/2019   Procedure: COLONOSCOPY;  Surgeon: West Bali, MD;  [facility];  Service: Endoscopy;  Laterality: N/A;  1:30   MOLE REMOVAL Right 01/26/2022   foot (patient was put to sleep for procedure)   POLYPECTOMY  06/25/2019   Procedure: POLYPECTOMY;  Surgeon: West Bali, MD;  [facility];  Service: Endoscopy;;   TOTAL KNEE ARTHROPLASTY Right 07/05/2022   Procedure: RIGHT TOTAL KNEE ARTHROPLASTY;  Surgeon: Tarry Kos, MD;  Location: [facility];  Service: Orthopedics;  Laterality: Right;   Social History   Social History Narrative   Patient is a widow that resides independently in [place].  She has 1 son, who resides in [place].   Immunization History  Administered Date(s) Administered   Fluad Quad(high Dose 65+) 09/16/2020, 09/07/2022   Influenza, Quadrivalent, Recombinant, Inj, Pf 09/19/2019   PFIZER(Purple Top)SARS-COV-2 Vaccination 05/23/2020, 06/13/2020, 07/31/2020, 02/28/2021   PNEUMOCOCCAL CONJUGATE-20 07/07/2022     Objective: Vital Signs: BP 128/82 (BP Location: Left Arm, Patient Position: Sitting, Cuff Size: Large)   Pulse (!) 104   Resp 13   Ht 5\' 3"  (1.6 m)   Wt 216 lb 9.6 oz (98.2 kg)   BMI 38.37 kg/m    Physical Exam Vitals and nursing note reviewed.  Constitutional:      Appearance: She is well-developed.  HENT:     Head: Normocephalic and atraumatic.  Eyes:     Conjunctiva/sclera: Conjunctivae normal.  Cardiovascular:     Rate and Rhythm: Normal rate and regular rhythm.     Heart sounds: Normal heart sounds.  Pulmonary:     Effort: Pulmonary effort is normal.     Breath sounds: Normal breath sounds.  Abdominal:     General: Bowel sounds are normal.     Palpations: Abdomen is soft.  Musculoskeletal:     Cervical back: Normal range of motion.  Lymphadenopathy:     Cervical: No cervical adenopathy.  Skin:     General: Skin is warm and dry.     Capillary Refill: Capillary refill takes less than 2 seconds.  Neurological:     Mental Status: She is alert and oriented to person, place, and time.  Psychiatric:        Behavior: Behavior normal.      Musculoskeletal Exam: Cervical spine was in good range of motion.  She had painful range of motion of bilateral shoulder joints.  Elbow joints, wrist joints, MCPs PIPs and DIPs with good range of motion with no synovitis.  Synovial thickening was noted over MCP joints.  Hip joints and knee joints were in good range of motion with discomfort.  Right knee joint was replaced.  There was no tenderness over ankles or MTPs.  CDAI Exam: CDAI Score: 10  Patient Global: 40 / 100; Provider Global: 20 / 100  Swollen: 0 ; Tender: 5  Joint Exam 03/14/2024      Right  Left  Glenohumeral   Tender   Tender  Lumbar Spine   Tender     Knee   Tender   Tender     Investigation: No additional findings.  Imaging: No results found.  Recent Labs: Lab Results  Component Value Date   WBC 6.0 02/12/2024   HGB 11.3 (L) 02/12/2024   PLT 335 02/12/2024   NA 137 02/12/2024   K 3.5 02/12/2024   CL 102 02/12/2024   CO2 25 02/12/2024   GLUCOSE 119 (H) 02/12/2024   BUN 17 02/12/2024   CREATININE 1.07 (H) 02/12/2024   BILITOT 0.4 02/12/2024   ALKPHOS 52 02/12/2024   AST 20 02/12/2024   ALT 14 02/12/2024   PROT 7.4 02/12/2024   ALBUMIN 2.7 (L) 02/12/2024   CALCIUM 8.8 (L) 02/12/2024   GFRAA 88 01/07/2021   QFTBGOLDPLUS Negative 03/02/2023    Speciality Comments: PLQ Eye Exam: 12/16/2018 WNL @ Dr Despina Arias Follow up in 1 year Humira 10/20-11/22 Enbrel 11/22-8/24-psoriasis Orencia 08/04/23  Procedures:  No procedures performed Allergies: Enbrel [etanercept]   Assessment / Plan:     Visit Diagnoses: Rheumatoid arthritis involving multiple sites with positive rheumatoid factor (HCC) - Positive RF, +14-3-3 eta, Positive ANA, erosive inflammatory: Patient continues  to have pain and discomfort in all of her joints and muscles.  No synovitis was noted on the examination.  She gives history of intermittent swelling in her hands and her feet.  No swelling was noted today.  She continues to take Orencia subcu injections weekly and leflunomide 20 mg daily without any interruption.  High risk medication use - Orencia 125mg  sq injections once weekly since August 04, 2023 and Arava 20 mg 1 tablet by mouth daily. (previously on enbrel).  Enbrel was discontinued due to psoriasis.  Labs from February 12, 2024 were reviewed.  CBC showed hemoglobin of 11.3.  CMP showed creatinine of 1.07 and calcium low at 8.8.  She was advised to get labs in June and every 3 months to monitor for drug toxicity.  TB Gold will be checked with the next labs.  Last TB Gold was negative on March 02, 2023.  Primary osteoarthritis of left knee -she continues to have pain and discomfort in her knee joints.  Previous x-rays were consistent with moderate osteoarthritis.  S/P TKR (total knee replacement), right - Performed by Dr. Roda Shutters on 07/05/22.  Chronic pain.  Chronic midline lower back pain without sciatica-patient states she continues to have pain and discomfort in her lower back.  She denies any sciatica symptoms.  Patient states she was evaluated by orthopedics and was advised that pain is coming from abnormal gait.  Gait instability-patient ambulates with the help of a walker.  She states her gait has been unstable.  She was given a prescription for a rollator walker with a seat.  ANA positive - No clinical features of systemic lupus at this time.  Other fatigue -she continues to have fatigue.  Most likely due to chronic disease.  Essential hypertension-blood pressure was normal at 128/82 today.  History of depression  Former smoker  Family history of thyroid disease  Family history of colon cancer  Orders: Orders Placed This Encounter  Procedures   QuantiFERON-TB Gold Plus   No orders  of the defined types were placed in this encounter.    Follow-Up Instructions: Return in about 5 months (around 08/14/2024) for Osteoarthritis, Rheumatoid  arthritis.   Pollyann Savoy, MD  Note - This record has been created using Animal nutritionist.  Chart creation errors have been sought, but may not always  have been located. Such creation errors do not reflect on  the standard of medical care.

## 2024-03-12 ENCOUNTER — Other Ambulatory Visit: Payer: Self-pay

## 2024-03-14 ENCOUNTER — Encounter: Payer: Self-pay | Admitting: Rheumatology

## 2024-03-14 ENCOUNTER — Ambulatory Visit: Payer: 59 | Attending: Rheumatology | Admitting: Rheumatology

## 2024-03-14 VITALS — BP 128/82 | HR 104 | Resp 13 | Ht 63.0 in | Wt 216.6 lb

## 2024-03-14 DIAGNOSIS — M545 Low back pain, unspecified: Secondary | ICD-10-CM | POA: Diagnosis not present

## 2024-03-14 DIAGNOSIS — G8929 Other chronic pain: Secondary | ICD-10-CM

## 2024-03-14 DIAGNOSIS — Z800 Family history of malignant neoplasm of digestive organs: Secondary | ICD-10-CM

## 2024-03-14 DIAGNOSIS — R768 Other specified abnormal immunological findings in serum: Secondary | ICD-10-CM

## 2024-03-14 DIAGNOSIS — Z96651 Presence of right artificial knee joint: Secondary | ICD-10-CM | POA: Diagnosis not present

## 2024-03-14 DIAGNOSIS — Z8349 Family history of other endocrine, nutritional and metabolic diseases: Secondary | ICD-10-CM

## 2024-03-14 DIAGNOSIS — Z79899 Other long term (current) drug therapy: Secondary | ICD-10-CM | POA: Diagnosis not present

## 2024-03-14 DIAGNOSIS — M0579 Rheumatoid arthritis with rheumatoid factor of multiple sites without organ or systems involvement: Secondary | ICD-10-CM

## 2024-03-14 DIAGNOSIS — M1712 Unilateral primary osteoarthritis, left knee: Secondary | ICD-10-CM

## 2024-03-14 DIAGNOSIS — R5383 Other fatigue: Secondary | ICD-10-CM

## 2024-03-14 DIAGNOSIS — I10 Essential (primary) hypertension: Secondary | ICD-10-CM | POA: Diagnosis not present

## 2024-03-14 DIAGNOSIS — Z87891 Personal history of nicotine dependence: Secondary | ICD-10-CM

## 2024-03-14 DIAGNOSIS — R21 Rash and other nonspecific skin eruption: Secondary | ICD-10-CM

## 2024-03-14 DIAGNOSIS — R2681 Unsteadiness on feet: Secondary | ICD-10-CM

## 2024-03-14 DIAGNOSIS — Z8659 Personal history of other mental and behavioral disorders: Secondary | ICD-10-CM

## 2024-03-14 NOTE — Patient Instructions (Signed)
Standing Labs We placed an order today for your standing lab work.   Please have your standing labs drawn in June and every 3 months  Please have your labs drawn 2 weeks prior to your appointment so that the provider can discuss your lab results at your appointment, if possible.  Please note that you may see your imaging and lab results in [facility] before we have reviewed them. We will contact you once all results are reviewed. Please allow our office up to 72 hours to thoroughly review all of the results before contacting the office for clarification of your results.  WALK-IN LAB HOURS  Monday through Thursday from 8:00 am -12:30 pm and 1:00 pm-5:00 pm and Friday from 8:00 am-12:00 pm.  Patients with office visits requiring labs will be seen before walk-in labs.  You may encounter longer than normal wait times. Please allow additional time. Wait times may be shorter on  Monday and Thursday afternoons.  We do not book appointments for walk-in labs. We appreciate your patience and understanding with our staff.   Labs are drawn by [facility]. Please bring your co-pay at the time of your lab draw.  You may receive a bill from [facility] for your lab work.  Please note if you are on Hydroxychloroquine and and an order has been placed for a Hydroxychloroquine level,  you will need to have it drawn 4 hours or more after your last dose.  If you wish to have your labs drawn at another location, please call the office 24 hours in advance so we can fax the orders.  The office is located at [address], [place], [place], [place] [address]   If you have any questions regarding directions or hours of operation,  please call [phone].   As a reminder, please drink plenty of water prior to coming for your lab work. Thanks!  Vaccines You are taking a medication(s) that can suppress your immune system.  The following immunizations are recommended: Flu annually Covid-19  RSV Td/Tdap (tetanus,  diphtheria, pertussis) every 10 years Pneumonia (Prevnar 15 then Pneumovax 23 at least 1 year apart.  Alternatively, can take Prevnar 20 without needing additional dose) Shingrix: 2 doses from 4 weeks to 6 months apart  Please check with your PCP to make sure you are up to date.   If you have signs or symptoms of an infection or start antibiotics: First, call your PCP for workup of your infection. Hold your medication through the infection, until you complete your antibiotics, and until symptoms resolve if you take the following: Injectable medication (Actemra, Benlysta, Cimzia, Cosentyx, Enbrel, Humira, Kevzara, Orencia, Remicade, Simponi, Stelara, Taltz, Tremfya) Methotrexate Leflunomide (Arava) Mycophenolate (Cellcept) Morrie Sheldon, Olumiant, or Rinvoq

## 2024-03-15 ENCOUNTER — Ambulatory Visit: Attending: Internal Medicine | Admitting: Internal Medicine

## 2024-03-15 ENCOUNTER — Telehealth: Payer: Self-pay

## 2024-03-15 ENCOUNTER — Encounter: Payer: Self-pay | Admitting: Internal Medicine

## 2024-03-15 VITALS — BP 118/58 | HR 103 | Ht 63.0 in | Wt 217.8 lb

## 2024-03-15 DIAGNOSIS — I351 Nonrheumatic aortic (valve) insufficiency: Secondary | ICD-10-CM

## 2024-03-15 DIAGNOSIS — R55 Syncope and collapse: Secondary | ICD-10-CM

## 2024-03-15 NOTE — Progress Notes (Signed)
 Cardiology Office Note  Date: 03/15/2024   ID: Jean Howell, DOB 09/24/1956, MRN [id number]  PCP:  Raliegh Ip, DO  Cardiologist:  Marjo Bicker, MD Electrophysiologist:  None   Reason for Office Visit: Syncope evaluation at the request of Dr. Nadine Counts.   History of Present Illness: Jean Howell is a 68 y.o. female known to have HTN, RA is here for follow-up visit.  Accompanied by niece.  6 episodes of syncope in the last 1 year.  She feels hot followed by LOC.  Denies having any chest pain, dizziness, palpitations prior to LOC.  In between the episodes, she feels all right.  No postictal confusion.  No seizure-like activity during the LOC.  Past Medical History:  Diagnosis Date   Arthritis    Depression    Hypertension    Rheumatoid arthritis (HCC)     Past Surgical History:  Procedure Laterality Date   BREAST EXCISIONAL BIOPSY Bilateral    pt unsure when- benign   BREAST SURGERY     nodules removed   COLONOSCOPY N/A 06/25/2019   Procedure: COLONOSCOPY;  Surgeon: West Bali, MD;  [facility];  Service: Endoscopy;  Laterality: N/A;  1:30   MOLE REMOVAL Right 01/26/2022   foot (patient was put to sleep for procedure)   POLYPECTOMY  06/25/2019   Procedure: POLYPECTOMY;  Surgeon: West Bali, MD;  [facility];  Service: Endoscopy;;   TOTAL KNEE ARTHROPLASTY Right 07/05/2022   Procedure: RIGHT TOTAL KNEE ARTHROPLASTY;  Surgeon: Tarry Kos, MD;  [facility];  Service: Orthopedics;  Laterality: Right;    Current Outpatient Medications  Medication Sig Dispense Refill   Abatacept (ORENCIA CLICKJECT) 125 MG/ML SOAJ Inject 125 mg into the skin once a week. 4 mL 2   betamethasone dipropionate (DIPROLENE) 0.05 % ointment Apply topically as needed.     Calcium-Magnesium-Zinc (CAL-MAG-ZINC PO) Take 1 tablet by mouth daily.     cyanocobalamin (VITAMIN B12) 1000 MCG tablet Take 1 tablet by mouth daily.     Ergocalciferol  (VITAMIN D2) 10 MCG (400 UNIT) TABS Take 1 tablet by mouth daily.     fluticasone (FLONASE) 50 MCG/ACT nasal spray USE 2 SPRAYS IN EACH NOSTRIL DAILY (Patient taking differently: Place 2 sprays into both nostrils as needed.) 16 g 6   gabapentin (NEURONTIN) 600 MG tablet Take 1 tablet (600 mg total) by mouth at bedtime. **dose change 90 tablet 3   ketoconazole (NIZORAL) 2 % shampoo Lather affected areas and leave on for 10 mins. Then rinse.  Use twice weekly until rash resolves. 120 mL 2   leflunomide (ARAVA) 20 MG tablet TAKE ONE (1) TABLET BY MOUTH EVERY DAY 90 tablet 0   levocetirizine (XYZAL) 5 MG tablet Take 1 tablet (5 mg total) by mouth at bedtime as needed for allergies (or itching). 90 tablet 3   losartan-hydrochlorothiazide (HYZAAR) 100-12.5 MG tablet TAKE ONE (1) TABLET BY MOUTH EVERY DAY 90 tablet 3   methocarbamol (ROBAXIN) 500 MG tablet Take 1 tablet (500 mg total) by mouth 2 (two) times daily as needed for muscle spasms. 20 tablet 1   Multiple Vitamin (MULTIVITAMIN PO) Take 1 tablet by mouth daily.     ondansetron (ZOFRAN) 4 MG tablet Take 1 tablet (4 mg total) by mouth every 8 (eight) hours as needed for nausea or vomiting. 40 tablet 0   oxyCODONE-acetaminophen (PERCOCET/ROXICET) 5-325 MG tablet 1 tablet every 4 (four) hours as needed for up to 20  doses.     rosuvastatin (CRESTOR) 10 MG tablet Take 1 tablet (10 mg total) by mouth daily. 90 tablet 3   Suvorexant (BELSOMRA) 10 MG TABS Take 10 mg by mouth at bedtime as needed (sleep). Place on hold 30 tablet 3   UNABLE TO FIND Med Name: CBD roll on, as needed     VITAMIN E PO Take by mouth daily.     No current facility-administered medications for this visit.   Allergies:  Enbrel [etanercept]   Social History: The patient  reports that she quit smoking about 25 years ago. Her smoking use included cigarettes. She started smoking about 40 years ago. She has a 15 pack-year smoking history. She has been exposed to tobacco smoke. She has  never used smokeless tobacco. She reports that she does not currently use alcohol. She reports that she does not use drugs.   Family History: The patient's family history includes CVA (age of onset: 25) in her mother; Cancer in her brother and father; Colon cancer in her father; Congestive Heart Failure in her sister; Diabetes in her sister and sister; Healthy in her son; Hypertension in her sister, sister, sister, and sister; Thyroid disease in her mother, niece, and sister.   ROS:  Please see the history of present illness. Otherwise, complete review of systems is positive for none.  All other systems are reviewed and negative.   Physical Exam: VS:  BP (!) 118/58   Pulse (!) 103   Ht 5\' 3"  (1.6 m)   Wt 217 lb 12.8 oz (98.8 kg)   SpO2 96%   BMI 38.58 kg/m , BMI Body mass index is 38.58 kg/m.  Wt Readings from Last 3 Encounters:  03/15/24 217 lb 12.8 oz (98.8 kg)  03/14/24 216 lb 9.6 oz (98.2 kg)  02/12/24 214 lb 15.2 oz (97.5 kg)    General: Patient appears comfortable at rest. HEENT: Conjunctiva and lids normal, oropharynx clear with moist mucosa. Neck: Supple, no elevated JVP or carotid bruits, no thyromegaly. Lungs: Clear to auscultation, nonlabored breathing at rest. Cardiac: Regular rate and rhythm, no S3 or significant systolic murmur, no pericardial rub. Abdomen: Soft, nontender, no hepatomegaly, bowel sounds present, no guarding or rebound. Extremities: No pitting edema, distal pulses 2+. Skin: Warm and dry. Musculoskeletal: No kyphosis. Neuropsychiatric: Alert and oriented x3, affect grossly appropriate.  ECG:  An ECG dated 12/27/2022 was personally reviewed today and demonstrated:  Normal sinus rhythm and no ST-T changes  Recent Labwork: 02/12/2024: ALT 14; AST 20; BUN 17; Creatinine, Ser 1.07; Hemoglobin 11.3; Platelets 335; Potassium 3.5; Sodium 137     Component Value Date/Time   CHOL 92 (L) 01/03/2024 1408   TRIG 123 01/03/2024 1408   HDL 37 (L) 01/03/2024 1408    CHOLHDL 2.5 01/03/2024 1408   LDLCALC 33 01/03/2024 1408     Assessment and Plan:  # Syncope, rule out structural heart disease -Multiple episodes of syncope in the last 1 year, total episodes are 6.  She has prodromal symptoms of feeling hot followed by loss of consciousness.  Denies having any chest pain, SOB, dizziness, palpitations prior to LOC.  Echocardiogram in 2024 showed normal LVEF, mild AR.  No evidence of structural heart disease.  Event monitor in 2023 showed no evidence of malignant arrhythmias or conduction abnormalities.  I will obtain Lexiscan to rule out any RCA ischemia.  She will benefit from referral to EP for loop recorder placement.  She should not drive.  # Mild aortic regurgitation  in 2024 -Asymptomatic.  Repeat echocardiogram every 3 to 5 years.  # HTN, controlled -Continue current antihypertensives, losartan-HCTZ 100-12.5 mg once daily.  Follows up with PCP.  # HLD, at goal -Continue rosuvastatin 10 mg at bedtime.  Follows with PCP.  Goal LDL should be less than 100.    Medication Adjustments/Labs and Tests Ordered: Current medicines are reviewed at length with the patient today.  Concerns regarding medicines are outlined above.   Tests Ordered: No orders of the defined types were placed in this encounter.   Medication Changes: No orders of the defined types were placed in this encounter.   Disposition:  Follow up 3 years  Signed Naythen Heikkila Verne Spurr, MD, 03/15/2024 2:49 PM    [facility] [address] [place], [place], [place] [address]

## 2024-03-15 NOTE — Patient Instructions (Addendum)
 Medication Instructions:  Your physician recommends that you continue on your current medications as directed. Please refer to the Current Medication list given to you today.   Labwork: None  Testing/Procedures: None  Follow-Up: Your physician recommends that you schedule a follow-up appointment in: 3 years. You will receive a reminder call in about 24 months reminding you to schedule your appointment. If you don't receive this call, please contact our office.   Any Other Special Instructions Will Be Listed Below (If Applicable). Thank you for choosing [facility]!     If you need a refill on your cardiac medications before your next appointment, please call your pharmacy.

## 2024-03-15 NOTE — Telephone Encounter (Signed)
 Ordered after patient left for appointment: Per Dr. Jenene Slicker- Can you obtain Lexiscan please For syncope. This is to rule out if she has any blockages in her heart artery.  Patient is aware of test. Advised that will be contacted by scheduled for an appointment. Verbalized understanding.

## 2024-03-16 ENCOUNTER — Telehealth: Payer: Self-pay | Admitting: Internal Medicine

## 2024-03-16 NOTE — Telephone Encounter (Signed)
 Checking percert on the following patient for testing scheduled at [facility].    LEXISCAN    03/30/2024

## 2024-03-30 ENCOUNTER — Ambulatory Visit (HOSPITAL_COMMUNITY)
Admission: RE | Admit: 2024-03-30 | Discharge: 2024-03-30 | Disposition: A | Discharge status: Home / Self Care | Source: Ambulatory Visit | Attending: Internal Medicine | Admitting: Internal Medicine

## 2024-03-30 ENCOUNTER — Encounter (HOSPITAL_COMMUNITY)
Admission: RE | Admit: 2024-03-30 | Discharge: 2024-03-30 | Disposition: A | Discharge status: Home / Self Care | Source: Ambulatory Visit | Attending: Internal Medicine | Admitting: Internal Medicine

## 2024-03-30 DIAGNOSIS — R55 Syncope and collapse: Secondary | ICD-10-CM | POA: Diagnosis not present

## 2024-03-30 LAB — NM MYOCAR MULTI W/SPECT W/WALL MOTION / EF
Base ST Depression (mm): 0 mm
Estimated workload: 1
LV dias vol: 65 mL (ref 46–106)
LV sys vol: 30 mL
MPHR: 151 {beats}/min
Nuc Stress EF: 55 %
Peak HR: 98 {beats}/min
Percent HR: 64 %
RATE: 0.3
Rest HR: 85 {beats}/min
Rest Nuclear Isotope Dose: 9.8 mCi
SDS: 2
SRS: 2
SSS: 4
ST Depression (mm): 0 mm
Stress Nuclear Isotope Dose: 30.2 mCi
TID: 1.17

## 2024-03-30 MED ORDER — REGADENOSON 0.4 MG/5ML IV SOLN
INTRAVENOUS | Status: AC
  Filled 2024-03-30: qty 5

## 2024-03-30 MED ORDER — SODIUM CHLORIDE FLUSH 0.9 % IV SOLN
INTRAVENOUS | Status: AC
  Filled 2024-03-30: qty 10

## 2024-03-30 MED ORDER — TECHNETIUM TC 99M TETROFOSMIN IV KIT
9.8000 | PACK | Freq: Once | INTRAVENOUS | Status: AC | PRN
  Administered 2024-03-30: 9.8 via INTRAVENOUS

## 2024-03-30 MED ORDER — TECHNETIUM TC 99M TETROFOSMIN IV KIT
30.2000 | PACK | Freq: Once | INTRAVENOUS | Status: AC | PRN
  Administered 2024-03-30: 30.2 via INTRAVENOUS

## 2024-04-03 DIAGNOSIS — R55 Syncope and collapse: Secondary | ICD-10-CM | POA: Diagnosis not present

## 2024-04-03 DIAGNOSIS — M1712 Unilateral primary osteoarthritis, left knee: Secondary | ICD-10-CM | POA: Diagnosis not present

## 2024-04-03 DIAGNOSIS — M0579 Rheumatoid arthritis with rheumatoid factor of multiple sites without organ or systems involvement: Secondary | ICD-10-CM | POA: Diagnosis not present

## 2024-04-06 ENCOUNTER — Other Ambulatory Visit: Payer: Self-pay | Admitting: Physician Assistant

## 2024-04-06 ENCOUNTER — Other Ambulatory Visit: Payer: Self-pay

## 2024-04-06 ENCOUNTER — Other Ambulatory Visit: Payer: Self-pay | Admitting: Pharmacy Technician

## 2024-04-06 DIAGNOSIS — Z79899 Other long term (current) drug therapy: Secondary | ICD-10-CM

## 2024-04-06 DIAGNOSIS — M0579 Rheumatoid arthritis with rheumatoid factor of multiple sites without organ or systems involvement: Secondary | ICD-10-CM

## 2024-04-06 MED ORDER — ORENCIA CLICKJECT 125 MG/ML ~~LOC~~ SOAJ
125.0000 mg | SUBCUTANEOUS | 2 refills | Status: DC
  Filled 2024-04-06: qty 4, 28d supply, fill #0
  Filled 2024-05-04: qty 4, 28d supply, fill #1
  Filled 2024-05-31: qty 4, 28d supply, fill #2

## 2024-04-06 NOTE — Progress Notes (Signed)
 Specialty Pharmacy Refill Coordination Note  Jean Howell is a 68 y.o. female contacted today regarding refills of specialty medication(s) Abatacept  (Orencia  ClickJect)   Patient requested Delivery   Delivery date: 04/11/24   Verified address: [address] [place] [address]   Medication will be filled on 04/10/24.  This fill date is pending response to refill request from provider. Patient is aware and if they have not received fill by intended date they must follow up with pharmacy.

## 2024-04-06 NOTE — Telephone Encounter (Signed)
 Last Fill: 01/03/2024  Labs: 02/12/2024 Hgb 11.3, Hct 35.4, RDW 16.9, Glucose 119, Creat. 1.07, Calcium  8.8, Albumin 2.7, GFR 56  TB Gold: 03/02/2023 Neg   Next Visit: 09/13/2024  Last Visit: 03/14/2024  [id number] arthritis involving multiple sites with positive rheumatoid factor   Current Dose per office note 03/14/2024: Orencia  125mg  sq injections once weekly   Patient to update TB Gold with June standing labs.   Okay to refill Orencia ?

## 2024-04-07 ENCOUNTER — Other Ambulatory Visit: Payer: Self-pay | Admitting: Physician Assistant

## 2024-04-09 NOTE — Telephone Encounter (Signed)
 Last Fill: 01/17/2024  Labs: 02/12/2024 Hgb 11.3, Hct 35.4, RDW 16.9, Glucose 119, Creat. 1.07, GFR 56, Calcium  8.8, Albumin 2.7  Next Visit: 09/13/2024   Last Visit: 03/14/2024  DX: Rheumatoid arthritis involving multiple sites with positive rheumatoid factor   Current Dose per office note 03/14/2024: Arava  20 mg 1 tablet by mouth daily   Okay to refill Arava  ?

## 2024-04-10 ENCOUNTER — Telehealth: Payer: Self-pay

## 2024-04-10 DIAGNOSIS — R55 Syncope and collapse: Secondary | ICD-10-CM

## 2024-04-10 DIAGNOSIS — R9439 Abnormal result of other cardiovascular function study: Secondary | ICD-10-CM

## 2024-04-10 DIAGNOSIS — Z01810 Encounter for preprocedural cardiovascular examination: Secondary | ICD-10-CM

## 2024-04-10 MED ORDER — METOPROLOL TARTRATE 25 MG PO TABS: 25.0000 mg | ORAL_TABLET | Freq: Once | ORAL | 0 refills | Status: DC

## 2024-04-10 NOTE — Telephone Encounter (Signed)
-----   Message from Vishnu P Mallipeddi sent at 04/09/2024 10:09 AM EDT ----- Stress test is abnormal.  Obtain CT cardiac for accurate evaluation of coronary anatomy and schedule follow-up appointment in 1 month to discuss next steps including medical management versus LHC depending on CT cardiac results.

## 2024-04-10 NOTE — Telephone Encounter (Signed)
 The patient has been notified of the result and verbalized understanding.  All questions (if any) were answered. Advised her that her instructions will be sent to her MyChart. Will need to have lab work completed 1-2 weeks prior to CTA Camilo Cella, [facility] 04/10/2024 3:20 PM

## 2024-04-16 ENCOUNTER — Encounter: Payer: Self-pay | Admitting: Cardiovascular Disease

## 2024-04-16 ENCOUNTER — Ambulatory Visit: Attending: Cardiovascular Disease | Admitting: Cardiovascular Disease

## 2024-04-16 VITALS — BP 110/72 | HR 99 | Resp 98 | Ht 63.0 in | Wt 213.0 lb

## 2024-04-16 DIAGNOSIS — R55 Syncope and collapse: Secondary | ICD-10-CM

## 2024-04-16 NOTE — Patient Instructions (Signed)
 Medication Instructions:  Your physician recommends that you continue on your current medications as directed. Please refer to the Current Medication list given to you today. *If you need a refill on your cardiac medications before your next appointment, please call your pharmacy*   Follow-Up: At [facility], you and your health needs are our priority.  As part of our continuing mission to provide you with exceptional heart care, our providers are all part of one team.  This team includes your primary Cardiologist (physician) and Advanced Practice Providers or APPs (Physician Assistants and Nurse Practitioners) who all work together to provide you with the care you need, when you need it.  Your next appointment:   Follow up with Dr Mallipeddi as instructed  Provider:   Dr Mallipeddi

## 2024-04-16 NOTE — Progress Notes (Signed)
 Electrophysiology Office Note:    Date:  04/16/2024   ID:  Jean Howell, DOB October 27, 1956, MRN [id number]  PCP:  Eliodoro Guerin, DO   [facility] Providers Cardiologist:  Lasalle Pointer, MD     Referring MD: Mallipeddi, Vishnu P, MD   History of Present Illness:    Jean Howell is a 68 y.o. female with a medical history significant for hypertension, hyperlipidemia, mild aortic regurgitation, referred for evaluation and management of syncope.     Discussed the use of AI scribe software for clinical note transcription with the patient, who gave verbal consent to proceed.  History of Present Illness Jean Howell is a 68 year old female who presents with episodes of syncope.  She has experienced episodes of syncope over the past two years, with approximately six instances resulting in loss of consciousness. The first episode occurred while fixing breakfast, where she found herself slumped over her walker. Subsequent episodes have occurred during activities such as cooking and showering, often preceded by a sensation of getting hot and feeling like she is going to pass out.  She describes a prodrome of feeling hot and sometimes attempts to cool off by opening a door or using a fan, but these measures are not always successful in preventing syncope. If she can lie down and cool off, the sensation often passes without her losing consciousness.   The episodes occur more frequently than the six times she has passed out, as she often feels the sensation but manages to avert syncope by lying down. She estimates having these sensations a couple of times a month, particularly when she is rushing or getting hot. During a family gathering, she felt the need to lie down but did not do so in time, resulting in syncope. Her niece observed that her eyes were open during the episode, but she was unresponsive.  She has not experienced any injuries from these episodes, as she tends to  slump down rather than fall abruptly.      Today, she feels well, that she is at baseline.  EKGs/Labs/Other Studies Reviewed Today:     Echocardiogram:  TTE January 13, 2023 EF 55 to 6%.  Grade 1 diastolic dysfunction.   Monitors:  14 day monitor 10/2022  -- my interpretation 9 beats of narrow complex tachycardia rate up to 200 bpm. Overall burden of ectopy less than 1%. No symptom events reported  Stress testing:  Nuclear stress test April 2025 Pharmacologic stress.  There was a large mild to moderate intensity inferior and inferior apical defect with moderate reversibility  Advanced imaging:  Coronary CT Apr 25, 2024 Test is scheduled  Cardiac catherization   EKG:   EKG Interpretation Date/Time:  Monday Apr 16 2024 14:20:10 EDT Ventricular Rate:  99 PR Interval:  156 QRS Duration:  84 QT Interval:  346 QTC Calculation: 444 R Axis:   10  Text Interpretation: Normal sinus rhythm Cannot rule out Anterior infarct , age undetermined When compared with ECG of 12-Feb-2024 18:31, no significant change Confirmed by Marlane Silver [phone]) on 04/16/2024 2:27:57 PM     Physical Exam:    VS:  BP 110/72   Pulse 99   Resp (!) 98   Ht 5\' 3"  (1.6 m)   Wt 213 lb (96.6 kg)   BMI 37.73 kg/m     Wt Readings from Last 3 Encounters:  04/16/24 213 lb (96.6 kg)  03/15/24 217 lb 12.8 oz (98.8 kg)  03/14/24 216 lb 9.6 oz (  98.2 kg)     GEN: Well nourished, well developed in no acute distress CARDIAC: RRR, no murmurs, rubs, gallops RESPIRATORY:  Normal work of breathing MUSCULOSKELETAL: no edema    ASSESSMENT & PLAN:     Syncope With long prodrome of flushing sensation Syncope is aborted by lying down We discussed the utility of a loop recorder, but with a classic vagal prodrome and the prolonged prodrome, I have low suspicion for prolonged pauses Even if we were to detect short pauses, I would defer loop recorder placement as long as she is able to avoid  syncope I instructed her on precautions to avoid syncope --primarily, lie down at earliest onset of symptoms and put her feet up.  I also told her counterpressure maneuvers and advised her to investigate compression hose.   I spent 38 minutes this visit including, reviewing prior studies and notes by Dr. Arthea Larsson    Signed, Efraim Grange, MD  04/16/2024 2:53 PM    [facility]

## 2024-04-18 ENCOUNTER — Other Ambulatory Visit

## 2024-04-18 DIAGNOSIS — Z01810 Encounter for preprocedural cardiovascular examination: Secondary | ICD-10-CM | POA: Diagnosis not present

## 2024-04-19 ENCOUNTER — Telehealth: Payer: Self-pay | Admitting: *Deleted

## 2024-04-19 LAB — BASIC METABOLIC PANEL WITH GFR
BUN/Creatinine Ratio: 10 — ABNORMAL LOW (ref 12–28)
BUN: 10 mg/dL (ref 8–27)
CO2: 22 mmol/L (ref 20–29)
Calcium: 8.9 mg/dL (ref 8.7–10.3)
Chloride: 103 mmol/L (ref 96–106)
Creatinine, Ser: 1.01 mg/dL — ABNORMAL HIGH (ref 0.57–1.00)
Glucose: 85 mg/dL (ref 70–99)
Potassium: 4.1 mmol/L (ref 3.5–5.2)
Sodium: 140 mmol/L (ref 134–144)
eGFR: 60 mL/min/{1.73_m2} (ref >59)

## 2024-04-19 NOTE — Telephone Encounter (Signed)
 Pt aware for will not be ready till next Tues/Wed PCP will be back in the office then.   Copied from CRM [id number]. Topic: General - Other >> Apr 18, 2024 10:23 AM Juluis Ok wrote: Reason for CRM: Patient states that she dropped off a form (does not recall the name of form) to providers office on Monday. She is wanting to know when it will be available for pickup. Request a callback.

## 2024-04-23 NOTE — Telephone Encounter (Signed)
 Aware paperwork is ready

## 2024-04-24 NOTE — Telephone Encounter (Signed)
 Please advise.   Copied from CRM [id number]. Topic: General - Other >> Apr 24, 2024 10:59 AM Hassie Lint wrote: Reason for CRM: Patient states that she dropped of papers in an envelope on Wednesday for the doctor to fill out in regards to Cap. States her niece came to the office yesterday to pick them up but it is not the paper she dropped off. Patient states she needs the paper she dropped of filled out asap as she has a limited time frame to turn them in.   Patient can be reached at [phone]

## 2024-04-24 NOTE — Telephone Encounter (Signed)
 Informed pt that form is still at front desk.

## 2024-04-25 ENCOUNTER — Ambulatory Visit (HOSPITAL_COMMUNITY)

## 2024-05-01 ENCOUNTER — Other Ambulatory Visit (HOSPITAL_COMMUNITY): Payer: Self-pay

## 2024-05-03 ENCOUNTER — Other Ambulatory Visit (HOSPITAL_COMMUNITY)

## 2024-05-04 ENCOUNTER — Other Ambulatory Visit (HOSPITAL_COMMUNITY): Payer: Self-pay

## 2024-05-04 ENCOUNTER — Other Ambulatory Visit: Payer: Self-pay

## 2024-05-04 NOTE — Progress Notes (Signed)
 Specialty Pharmacy Refill Coordination Note  Spoke with Jean Howell (Self).   Jean Howell is a 68 y.o. female contacted today regarding refills of specialty medication(s) Abatacept  (Orencia  [employer])   Patient requested Delivery   Delivery date: 05/08/24   Verified address: [address]   [place] [id number]   Medication will be filled on 05/07/24.

## 2024-05-07 ENCOUNTER — Encounter (HOSPITAL_COMMUNITY): Payer: Self-pay

## 2024-05-08 ENCOUNTER — Other Ambulatory Visit (HOSPITAL_COMMUNITY): Payer: Self-pay | Admitting: *Deleted

## 2024-05-08 ENCOUNTER — Encounter: Payer: Self-pay | Admitting: Family Medicine

## 2024-05-08 ENCOUNTER — Telehealth (HOSPITAL_COMMUNITY): Payer: Self-pay | Admitting: *Deleted

## 2024-05-08 MED ORDER — METOPROLOL TARTRATE 50 MG PO TABS: ORAL_TABLET | ORAL | 0 refills | Status: DC

## 2024-05-08 NOTE — Telephone Encounter (Signed)
 Reaching out to patient to offer assistance regarding upcoming cardiac imaging study; pt verbalizes understanding of appt date/time, parking situation and where to check in, pre-test NPO status and medications ordered, and verified current allergies; name and call back number provided for further questions should they arise  Larey Brick RN Navigator Cardiac Imaging [facility] [phone] office [phone] cell

## 2024-05-09 ENCOUNTER — Ambulatory Visit (HOSPITAL_COMMUNITY)
Admission: RE | Admit: 2024-05-09 | Discharge: 2024-05-09 | Disposition: A | Discharge status: Home / Self Care | Source: Ambulatory Visit | Attending: Internal Medicine | Admitting: Internal Medicine

## 2024-05-09 DIAGNOSIS — Z01810 Encounter for preprocedural cardiovascular examination: Secondary | ICD-10-CM | POA: Diagnosis not present

## 2024-05-09 DIAGNOSIS — R918 Other nonspecific abnormal finding of lung field: Secondary | ICD-10-CM | POA: Insufficient documentation

## 2024-05-09 DIAGNOSIS — R9430 Abnormal result of cardiovascular function study, unspecified: Secondary | ICD-10-CM

## 2024-05-09 DIAGNOSIS — R55 Syncope and collapse: Secondary | ICD-10-CM

## 2024-05-09 DIAGNOSIS — I2510 Atherosclerotic heart disease of native coronary artery without angina pectoris: Secondary | ICD-10-CM | POA: Diagnosis not present

## 2024-05-09 DIAGNOSIS — J849 Interstitial pulmonary disease, unspecified: Secondary | ICD-10-CM | POA: Diagnosis not present

## 2024-05-09 DIAGNOSIS — R9439 Abnormal result of other cardiovascular function study: Secondary | ICD-10-CM | POA: Diagnosis not present

## 2024-05-09 MED ORDER — NITROGLYCERIN 0.4 MG SL SUBL
SUBLINGUAL_TABLET | SUBLINGUAL | Status: AC
  Filled 2024-05-09: qty 2

## 2024-05-09 MED ORDER — IOHEXOL 350 MG/ML SOLN
100.0000 mL | Freq: Once | INTRAVENOUS | Status: AC | PRN
  Administered 2024-05-09: 100 mL via INTRAVENOUS

## 2024-05-09 MED ORDER — NITROGLYCERIN 0.4 MG SL SUBL: 0.8000 mg | SUBLINGUAL_TABLET | Freq: Once | SUBLINGUAL | Status: DC

## 2024-05-10 ENCOUNTER — Encounter (INDEPENDENT_AMBULATORY_CARE_PROVIDER_SITE_OTHER): Payer: Self-pay | Admitting: *Deleted

## 2024-05-11 ENCOUNTER — Other Ambulatory Visit: Payer: Self-pay

## 2024-05-11 ENCOUNTER — Ambulatory Visit: Payer: Self-pay | Admitting: Internal Medicine

## 2024-05-11 ENCOUNTER — Observation Stay (HOSPITAL_COMMUNITY)

## 2024-05-11 ENCOUNTER — Encounter (HOSPITAL_COMMUNITY): Payer: Self-pay

## 2024-05-11 ENCOUNTER — Emergency Department (HOSPITAL_COMMUNITY)

## 2024-05-11 ENCOUNTER — Observation Stay (HOSPITAL_COMMUNITY)
Admission: EM | Admit: 2024-05-11 | Discharge: 2024-05-13 | Disposition: A | Discharge status: Home / Self Care | Attending: Emergency Medicine | Admitting: Emergency Medicine

## 2024-05-11 ENCOUNTER — Telehealth: Payer: Self-pay | Admitting: *Deleted

## 2024-05-11 DIAGNOSIS — E785 Hyperlipidemia, unspecified: Secondary | ICD-10-CM | POA: Diagnosis not present

## 2024-05-11 DIAGNOSIS — R9439 Abnormal result of other cardiovascular function study: Secondary | ICD-10-CM | POA: Diagnosis present

## 2024-05-11 DIAGNOSIS — J8410 Pulmonary fibrosis, unspecified: Secondary | ICD-10-CM | POA: Insufficient documentation

## 2024-05-11 DIAGNOSIS — I2699 Other pulmonary embolism without acute cor pulmonale: Secondary | ICD-10-CM

## 2024-05-11 DIAGNOSIS — I82409 Acute embolism and thrombosis of unspecified deep veins of unspecified lower extremity: Secondary | ICD-10-CM | POA: Diagnosis present

## 2024-05-11 DIAGNOSIS — I700 Atherosclerosis of aorta: Secondary | ICD-10-CM | POA: Diagnosis not present

## 2024-05-11 DIAGNOSIS — I82432 Acute embolism and thrombosis of left popliteal vein: Secondary | ICD-10-CM | POA: Diagnosis not present

## 2024-05-11 DIAGNOSIS — I10 Essential (primary) hypertension: Secondary | ICD-10-CM | POA: Diagnosis not present

## 2024-05-11 DIAGNOSIS — R55 Syncope and collapse: Secondary | ICD-10-CM | POA: Diagnosis not present

## 2024-05-11 DIAGNOSIS — I517 Cardiomegaly: Secondary | ICD-10-CM | POA: Diagnosis not present

## 2024-05-11 DIAGNOSIS — M0579 Rheumatoid arthritis with rheumatoid factor of multiple sites without organ or systems involvement: Secondary | ICD-10-CM | POA: Diagnosis present

## 2024-05-11 DIAGNOSIS — Z87891 Personal history of nicotine dependence: Secondary | ICD-10-CM | POA: Diagnosis not present

## 2024-05-11 HISTORY — DX: Other pulmonary embolism without acute cor pulmonale: I26.99

## 2024-05-11 LAB — COMPREHENSIVE METABOLIC PANEL WITH GFR
ALT: 21 U/L (ref 0–44)
AST: 32 U/L (ref 15–41)
Albumin: 2.8 g/dL — ABNORMAL LOW (ref 3.5–5.0)
Alkaline Phosphatase: 60 U/L (ref 38–126)
Anion gap: 10 (ref 5–15)
BUN: 8 mg/dL (ref 8–23)
CO2: 26 mmol/L (ref 22–32)
Calcium: 8.9 mg/dL (ref 8.9–10.3)
Chloride: 102 mmol/L (ref 98–111)
Creatinine, Ser: 0.87 mg/dL (ref 0.44–1.00)
GFR, Estimated: >60 mL/min
Glucose, Bld: 85 mg/dL (ref 70–99)
Potassium: 3.5 mmol/L (ref 3.5–5.1)
Sodium: 138 mmol/L (ref 135–145)
Total Bilirubin: 0.5 mg/dL (ref 0.0–1.2)
Total Protein: 7.2 g/dL (ref 6.5–8.1)

## 2024-05-11 LAB — CBC WITH DIFFERENTIAL/PLATELET
Abs Immature Granulocytes: 0.01 10*3/uL (ref 0.00–0.07)
Basophils Absolute: 0 10*3/uL (ref 0.0–0.1)
Basophils Relative: 0 %
Eosinophils Absolute: 0.1 10*3/uL (ref 0.0–0.5)
Eosinophils Relative: 2 %
HCT: 33 % — ABNORMAL LOW (ref 36.0–46.0)
Hemoglobin: 10.8 g/dL — ABNORMAL LOW (ref 12.0–15.0)
Immature Granulocytes: 0 %
Lymphocytes Relative: 28 %
Lymphs Abs: 1.5 10*3/uL (ref 0.7–4.0)
MCH: 28 pg (ref 26.0–34.0)
MCHC: 32.7 g/dL (ref 30.0–36.0)
MCV: 85.5 fL (ref 80.0–100.0)
Monocytes Absolute: 0.1 10*3/uL (ref 0.1–1.0)
Monocytes Relative: 2 %
Neutro Abs: 3.6 10*3/uL (ref 1.7–7.7)
Neutrophils Relative %: 68 %
Platelets: 264 10*3/uL (ref 150–400)
RBC: 3.86 MIL/uL — ABNORMAL LOW (ref 3.87–5.11)
RDW: 17.3 % — ABNORMAL HIGH (ref 11.5–15.5)
WBC: 5.4 10*3/uL (ref 4.0–10.5)
nRBC: 0 % (ref 0.0–0.2)

## 2024-05-11 LAB — PROTIME-INR
INR: 1 (ref 0.8–1.2)
Prothrombin Time: 13.1 s (ref 11.4–15.2)

## 2024-05-11 LAB — TROPONIN I (HIGH SENSITIVITY): Troponin I (High Sensitivity): 4 ng/L (ref <18)

## 2024-05-11 LAB — APTT: aPTT: 25 s (ref 24–36)

## 2024-05-11 MED ORDER — LOSARTAN POTASSIUM-HCTZ 100-12.5 MG PO TABS: 1.0000 | ORAL_TABLET | Freq: Every day | ORAL | Status: DC

## 2024-05-11 MED ORDER — VITAMIN E 45 MG (100 UNIT) PO CAPS
100.0000 [IU] | ORAL_CAPSULE | Freq: Every day | ORAL | Status: DC
  Filled 2024-05-11 (×2): qty 1

## 2024-05-11 MED ORDER — ENOXAPARIN SODIUM 100 MG/ML IJ SOSY
1.0000 mg/kg | PREFILLED_SYRINGE | Freq: Once | INTRAMUSCULAR | Status: AC
  Administered 2024-05-11: 100 mg via SUBCUTANEOUS
  Filled 2024-05-11: qty 1

## 2024-05-11 MED ORDER — ADULT MULTIVITAMIN W/MINERALS CH
1.0000 | ORAL_TABLET | Freq: Every day | ORAL | Status: DC
  Administered 2024-05-12 – 2024-05-13 (×2): 1 via ORAL
  Filled 2024-05-11 (×2): qty 1

## 2024-05-11 MED ORDER — LEFLUNOMIDE 20 MG PO TABS
20.0000 mg | ORAL_TABLET | Freq: Every day | ORAL | Status: DC
  Administered 2024-05-11 – 2024-05-12 (×2): 20 mg via ORAL
  Filled 2024-05-11 (×3): qty 1

## 2024-05-11 MED ORDER — ACETAMINOPHEN 650 MG RE SUPP: 650.0000 mg | Freq: Four times a day (QID) | RECTAL | Status: DC | PRN

## 2024-05-11 MED ORDER — ROSUVASTATIN CALCIUM 10 MG PO TABS
10.0000 mg | ORAL_TABLET | Freq: Every day | ORAL | Status: DC
  Administered 2024-05-11 – 2024-05-12 (×2): 10 mg via ORAL
  Filled 2024-05-11 (×2): qty 1

## 2024-05-11 MED ORDER — ENOXAPARIN SODIUM 100 MG/ML IJ SOSY
1.0000 mg/kg | PREFILLED_SYRINGE | Freq: Two times a day (BID) | INTRAMUSCULAR | Status: DC
  Administered 2024-05-12: 100 mg via SUBCUTANEOUS
  Filled 2024-05-11: qty 1

## 2024-05-11 MED ORDER — ZOLPIDEM TARTRATE 5 MG PO TABS: 5.0000 mg | ORAL_TABLET | Freq: Every evening | ORAL | Status: DC | PRN

## 2024-05-11 MED ORDER — IOHEXOL 350 MG/ML SOLN
75.0000 mL | Freq: Once | INTRAVENOUS | Status: AC | PRN
  Administered 2024-05-11: 75 mL via INTRAVENOUS

## 2024-05-11 MED ORDER — ACETAMINOPHEN 325 MG PO TABS: 650.0000 mg | ORAL_TABLET | Freq: Four times a day (QID) | ORAL | Status: DC | PRN

## 2024-05-11 MED ORDER — HYDRALAZINE HCL 20 MG/ML IJ SOLN: 5.0000 mg | INTRAMUSCULAR | Status: DC | PRN

## 2024-05-11 MED ORDER — VITAMIN B-12 1000 MCG PO TABS
1000.0000 ug | ORAL_TABLET | Freq: Every day | ORAL | Status: DC
  Administered 2024-05-11 – 2024-05-12 (×2): 1000 ug via ORAL
  Filled 2024-05-11 (×2): qty 1

## 2024-05-11 MED ORDER — VITAMIN D 25 MCG (1000 UNIT) PO TABS
1000.0000 [IU] | ORAL_TABLET | Freq: Every day | ORAL | Status: DC
  Administered 2024-05-11 – 2024-05-12 (×2): 1000 [IU] via ORAL
  Filled 2024-05-11 (×2): qty 1

## 2024-05-11 MED ORDER — LOSARTAN POTASSIUM 50 MG PO TABS
100.0000 mg | ORAL_TABLET | Freq: Every day | ORAL | Status: DC
  Administered 2024-05-12 – 2024-05-13 (×2): 100 mg via ORAL
  Filled 2024-05-11 (×2): qty 2

## 2024-05-11 MED ORDER — HYDROCHLOROTHIAZIDE 12.5 MG PO TABS
12.5000 mg | ORAL_TABLET | Freq: Every day | ORAL | Status: DC
  Administered 2024-05-12 – 2024-05-13 (×2): 12.5 mg via ORAL
  Filled 2024-05-11 (×2): qty 1

## 2024-05-11 MED ORDER — GABAPENTIN 300 MG PO CAPS
600.0000 mg | ORAL_CAPSULE | Freq: Every day | ORAL | Status: DC
  Administered 2024-05-11 – 2024-05-12 (×2): 600 mg via ORAL
  Filled 2024-05-11 (×2): qty 2

## 2024-05-11 MED ORDER — SUVOREXANT 10 MG PO TABS: 10.0000 mg | ORAL_TABLET | Freq: Every evening | ORAL | Status: DC | PRN

## 2024-05-11 NOTE — ED Triage Notes (Signed)
 Pt reports she had a stress test done on Wednesday and was told today she may have a PE and she needed to come to the ER.

## 2024-05-11 NOTE — ED Notes (Signed)
 Patient transported to CT

## 2024-05-11 NOTE — H&P (Addendum)
 [facility] H&P   Patient Demographics:    Jean Howell, is a 68 y.o. female  MRN: [id number]   DOB - 1956-08-12  Admit Date - 05/11/2024  Outpatient Primary MD for the patient is Eliodoro Guerin, DO  Referring MD/NP/PA: PA Rigney  Patient coming from: Home  Chief Complaint  Patient presents with   abnormal stress test      HPI:    Jean Howell  is a 68 y.o. female, with past medical history of hypertension, hyperlipidemia, rheumatoid arthritis, obesity, patient is retired, patient had a CTA coronary by cardiology, and some findings were concerning for PE, so she was notified by urologist to come to ED for evaluation, she is herself denies any dyspnea, chest pain, shortness of breath, leg pain, leg edema, and recent surgery or recent travel, CTA chest in ED was significant for PE with right heart strain, she ports she is up-to-date in her colonoscopy, and mammograms, she was started on Lovenox full dose treatment, and [name] hospitalist requested to admit for further evaluation.    Review of systems:      A full 10 point Review of Systems was done, except as stated above, all other Review of Systems were negative.   With Past History of the following :    Past Medical History:  Diagnosis Date   Arthritis    Depression    Hypertension    Rheumatoid arthritis (HCC)       Past Surgical History:  Procedure Laterality Date   BREAST EXCISIONAL BIOPSY Bilateral    pt unsure when- benign   BREAST SURGERY     nodules removed   COLONOSCOPY N/A 06/25/2019   Procedure: COLONOSCOPY;  Surgeon: Alyce Jubilee, MD;  Location: [facility];  Service: Endoscopy;  Laterality: N/A;  1:30   MOLE REMOVAL Right 01/26/2022   foot (patient was put to sleep for procedure)   POLYPECTOMY  06/25/2019   Procedure: POLYPECTOMY;  Surgeon: Alyce Jubilee, MD;  Location: [facility];   Service: Endoscopy;;   TOTAL KNEE ARTHROPLASTY Right 07/05/2022   Procedure: RIGHT TOTAL KNEE ARTHROPLASTY;  Surgeon: Wes Hamman, MD;  Location: [facility] OR;  Service: Orthopedics;  Laterality: Right;      Social History:     Social History   Tobacco Use   Smoking status: Former    Current packs/day: 0.00    Average packs/day: 1 pack/day for 15.0 years (15.0 ttl pk-yrs)    Types: Cigarettes    Start date: 26    Quit date: 2000    Years since quitting: 25.4    Passive exposure: Past   Smokeless tobacco: Never  Substance Use Topics   Alcohol use: Not Currently       Family History :     Family History  Problem Relation Age of Onset   Thyroid  disease Mother    CVA Mother  60   Cancer Father    Colon cancer Father    Thyroid  disease Sister    Congestive Heart Failure Sister    Hypertension Sister    Diabetes Sister    Hypertension Sister    Diabetes Sister    Hypertension Sister    Hypertension Sister    Cancer Brother        kidney    Healthy Son    Thyroid  disease Niece    Breast cancer Neg Hx       Home Medications:   Prior to Admission medications   Medication Sig Start Date End Date Taking? Authorizing Provider  Abatacept  (ORENCIA  CLICKJECT) 125 MG/ML SOAJ Inject 125 mg into the skin once a week. 04/06/24  Yes Deveshwar, Clydie Darter, MD  acetaminophen  (TYLENOL ) 650 MG CR tablet Take 1,300 mg by mouth every 8 (eight) hours as needed for pain.   Yes [provider]  betamethasone dipropionate (DIPROLENE) 0.05 % ointment Apply topically as needed.   Yes [provider]  Calcium -Magnesium-Zinc (CAL-MAG-ZINC PO) Take 1 tablet by mouth daily.   Yes [provider]  cyanocobalamin (VITAMIN B12) 1000 MCG tablet Take 1 tablet by mouth daily. 12/16/21  Yes [provider]  Ergocalciferol  (VITAMIN D2) 10 MCG (400 UNIT) TABS Take 1 tablet by mouth daily. 12/16/21  Yes [provider]  fluticasone  (FLONASE ) 50 MCG/ACT nasal spray USE 2  SPRAYS IN EACH NOSTRIL DAILY Patient taking differently: Place 2 sprays into both nostrils as needed. 04/18/23  Yes Gottschalk, Sharolyn Decant M, DO  gabapentin  (NEURONTIN ) 600 MG tablet Take 1 tablet (600 mg total) by mouth at bedtime. **dose change 01/03/24  Yes Gottschalk, Ashly M, DO  leflunomide  (ARAVA ) 20 MG tablet TAKE ONE (1) TABLET BY MOUTH EVERY DAY 04/09/24  Yes Deveshwar, Clydie Darter, MD  losartan -hydrochlorothiazide (HYZAAR) 100-12.5 MG tablet TAKE ONE (1) TABLET BY MOUTH EVERY DAY 01/03/24  Yes Vicky Grange M, DO  Multiple Vitamin (MULTIVITAMIN PO) Take 1 tablet by mouth daily.   Yes [provider]  rosuvastatin  (CRESTOR ) 10 MG tablet Take 1 tablet (10 mg total) by mouth daily. 01/03/24  Yes Gottschalk, Sharolyn Decant M, DO  Suvorexant  (BELSOMRA ) 10 MG TABS Take 10 mg by mouth at bedtime as needed (sleep). Place on hold 09/07/22  Yes Vicky Grange M, DO  VITAMIN E PO Take by mouth daily.   Yes [provider]  metoprolol  tartrate (LOPRESSOR ) 50 MG tablet Take tablet (50mg ) by mouth TWO hours prior to your cardiac CT scan. Patient not taking: Reported on 05/11/2024 05/08/24   Mallipeddi, Kennyth Pean, MD  UNABLE TO FIND Med Name: CBD roll on, as needed    [provider]     Allergies:     Allergies  Allergen Reactions   Enbrel  [Etanercept ] Rash    Psoriasis     Physical Exam:   Vitals  Blood pressure (!) 156/85, pulse 75, temperature 98.4 F (36.9 C), temperature source Oral, resp. rate 12, height 5\' 3"  (1.6 m), weight 98.9 kg, SpO2 98%.   1. General Well Developed female, laying in bed, no apparent distress  2. Normal affect and insight, Not Suicidal or Homicidal, Awake Alert, Oriented X 3.  3. No F.N deficits, ALL C.Nerves Intact, Strength 5/5 all 4 extremities, Sensation intact all 4 extremities, Plantars down going.  4. Ears and Eyes appear Normal, Conjunctivae clear, PERRLA. Moist Oral Mucosa.  5. Supple Neck, No JVD, No cervical lymphadenopathy appriciated,  No Carotid Bruits.  6. Symmetrical Chest wall movement,  Good air movement bilaterally, CTAB.  7. RRR, No Gallops, Rubs or Murmurs, No Parasternal Heave.  8. Positive Bowel Sounds, Abdomen Soft, No tenderness, No organomegaly appriciated,No rebound -guarding or rigidity.  9.  No Cyanosis, Normal Skin Turgor, No Skin Rash or Bruise.  10. Good muscle tone,  joints appear normal , no effusions, Normal ROM.    Data Review:    CBC Recent Labs  Lab 05/11/24 1432  WBC 5.4  HGB 10.8*  HCT 33.0*  PLT 264  MCV 85.5  MCH 28.0  MCHC 32.7  RDW 17.3*  LYMPHSABS 1.5  MONOABS 0.1  EOSABS 0.1  BASOSABS 0.0   ------------------------------------------------------------------------------------------------------------------  Chemistries  Recent Labs  Lab 05/11/24 1546  NA 138  K 3.5  CL 102  CO2 26  GLUCOSE 85  BUN 8  CREATININE 0.87  CALCIUM  8.9  AST 32  ALT 21  ALKPHOS 60  BILITOT 0.5   ------------------------------------------------------------------------------------------------------------------ estimated creatinine clearance is 68.4 mL/min (by C-G formula based on SCr of 0.87 mg/dL). ------------------------------------------------------------------------------------------------------------------ No results for input(s): "TSH", "T4TOTAL", "T3FREE", "THYROIDAB" in the last 72 hours.  Invalid input(s): "FREET3"  Coagulation profile Recent Labs  Lab 05/11/24 1650  INR 1.0   ------------------------------------------------------------------------------------------------------------------- No results for input(s): "DDIMER" in the last 72 hours. -------------------------------------------------------------------------------------------------------------------  Cardiac Enzymes No results for input(s): "CKMB", "TROPONINI", "MYOGLOBIN" in the last 168 hours.  Invalid input(s):  "CK" ------------------------------------------------------------------------------------------------------------------ No results found for: "BNP"   ---------------------------------------------------------------------------------------------------------------  Urinalysis    Component Value Date/Time   COLORURINE YELLOW 02/26/2019 1040   APPEARANCEUR CLEAR 02/26/2019 1040   LABSPEC 1.025 02/26/2019 1040   PHURINE < OR = 5.0 02/26/2019 1040   GLUCOSEU NEGATIVE 02/26/2019 1040   HGBUR NEGATIVE 02/26/2019 1040   KETONESUR TRACE (A) 02/26/2019 1040   PROTEINUR NEGATIVE 02/26/2019 1040   NITRITE NEGATIVE 02/26/2019 1040   LEUKOCYTESUR 2+ (A) 02/26/2019 1040    ----------------------------------------------------------------------------------------------------------------   Imaging Results:    CT Angio Chest PE W/Cm &/Or Wo Cm Result Date: 05/11/2024 CLINICAL DATA:  Syncope.  Concern for pulmonary embolism. EXAM: CT ANGIOGRAPHY CHEST WITH CONTRAST TECHNIQUE: Multidetector CT imaging of the chest was performed using the standard protocol during bolus administration of intravenous contrast. Multiplanar CT image reconstructions and MIPs were obtained to evaluate the vascular anatomy. RADIATION DOSE REDUCTION: This exam was performed according to the departmental dose-optimization program which includes automated exposure control, adjustment of the mA and/or kV according to patient size and/or use of iterative reconstruction technique. CONTRAST:  75mL OMNIPAQUE IOHEXOL 350 MG/ML SOLN COMPARISON:  Cardiac CT dated 05/09/2024. FINDINGS: Cardiovascular: Borderline cardiomegaly. No pericardial effusion. Mild atherosclerotic calcification of the thoracic aorta. No aneurysmal dilatation or dissection. The origins of the great vessels of the aortic arch appear patent. Moderate amount of thrombus in the distal right main pulmonary artery extending to the lobar and segmental branches of the right upper and  right lower lobes. Mildly dilated right ventricle with RV/LV ratio in the range of 0.7-1.0. Correlation with EKG and troponin levels recommended to evaluate for right heart straining. Mediastinum/Nodes: No hilar or mediastinal adenopathy. The esophagus is grossly unremarkable. No mediastinal fluid collection. Lungs/Pleura: Bibasilar subpleural reticulation and honeycombing consistent with fibrosis. Small left upper lobe calcified granuloma. There is a 4 mm right upper lobe nodule. No focal consolidation, pleural effusion or pneumothorax. The central airways are patent. Upper Abdomen: No acute abnormality. Musculoskeletal: Osteopenia with degenerative changes. No acute osseous pathology. Review of the MIP images confirms the above findings. IMPRESSION: 1. Right pulmonary  artery embolus with borderline right heart strain. Correlation with EKG and troponin levels recommended. 2. Bibasilar fibrosis. 3.  Aortic Atherosclerosis (ICD10-I70.0). These results were called by telephone at the time of interpretation on 05/11/2024 at 4:48 pm to provider CHRISTOPHER RIGNEY , who verbally acknowledged these results. Electronically Signed   By: Angus Bark M.D.   On: 05/11/2024 16:58     EKG:  Vent. rate 79 BPM PR interval 166 ms QRS duration 110 ms QT/QTcB 420/482 ms P-R-T axes 57 54 38 Sinus rhythm Borderline T abnormalities, anterior leads  Assessment & Plan:    Principal Problem:   Pulmonary emboli (HCC) Active Problems:   Morbid obesity (HCC)   Essential hypertension   Rheumatoid arthritis involving multiple sites with positive rheumatoid factor (HCC)   HLD (hyperlipidemia)    Pulmonary embolism - Appears unprovoked - Will obtain v 2D echo to rule out right heart strain - Will obtain a venous Doppler to rule out DVT - Keep on full dose Lovenox, transition to Eliquis in a.m. if remains stable - Unprovoked PE, no significant family history of VTE, as she reports 2 nieces and 1 nephew with VTE, to  follow-up with hematology as an outpatient  History of rheumatoid arthritis - Appears to be on Orencia  injection weekly and Arava , continue with Arava  during hospital stay  Hypertension - Continue with home medications  Hyperlipidemia - Continue with home statin   DVT Prophylaxis Full dose lovenox  AM Labs Ordered, also please review Full Orders  Family Communication: Admission, patients condition and plan of care including tests being ordered have been discussed with the patient who indicate understanding and agree with the plan and Code Status.  Code Status Full  Likely DC to  home  Consults called: none    Admission status: observation    Time spent in minutes : 60 minutes   Seena Dadds M.D on 05/11/2024 at 5:18 PM   [facility] - Office  [phone]

## 2024-05-11 NOTE — Progress Notes (Addendum)
   05/11/24 2150  TOC Brief Assessment  Insurance and Status Reviewed  Patient has primary care physician Yes  Home environment has been reviewed From home alone  Prior level of function: Independent  Prior/Current Home Services No current home services  Social Drivers of Health Review SDOH reviewed no interventions necessary  Readmission risk has been reviewed Yes  Transition of care needs no transition of care needs at this time   Pt is working c/DSS to get a PCA. Pt has RA and s losing strength and function of BUE.  TOC to monitor for [facility] need.

## 2024-05-11 NOTE — ED Notes (Signed)
 Admitting at bedside

## 2024-05-11 NOTE — Consult Note (Signed)
 PHARMACY - ANTICOAGULATION CONSULT NOTE  Pharmacy Consult for Lovenox Indication: pulmonary embolus  Patient Measurements: Height: 5\' 3"  (160 cm) Weight: 98.9 kg (218 lb) IBW/kg (Calculated) : 52.4 HEPARIN DW (KG): 75.5  Labs: Recent Labs    05/11/24 1432 05/11/24 1546 05/11/24 1650  HGB 10.8*  --   --   HCT 33.0*  --   --   PLT 264  --   --   APTT  --   --  25  LABPROT  --   --  13.1  INR  --   --  1.0  CREATININE  --  0.87  --   TROPONINIHS 4  --   --     Estimated Creatinine Clearance: 68.4 mL/min (by C-G formula based on SCr of 0.87 mg/dL).   Medical History: Past Medical History:  Diagnosis Date   Arthritis    Depression    Hypertension    Rheumatoid arthritis (HCC)     Medications:  No apparent anticoagulation prior to admission per my chart review  Assessment: Patient is a 68 y/o F with medical history as above being admitted with acute PE. Pharmacy consulted for Lovenox dosing.  Baseline CBC with Hgb 10.8, platelets normal. Scr 0.87, CrCl > 60  Plan:  --Lovenox 100 mg (1 mg/kg) q12h --Scr / CBC per protocol --Follow-up transition to oral anticoagulation as appropriate  Page Boast 05/11/2024,5:21 PM

## 2024-05-11 NOTE — Telephone Encounter (Signed)
 Per Dr. Mallipeddi:  Jean Howell, can you call patient and have her come to ER today for STAT dedicated CT angio chest. 911 would be ideal or her family member dropping her off at the ER. She should not drive.   Spoke with pt and informed that she needs to be seen in the ER now for further testing. Pt informed not to drive. Instructed to have someone bring her or call 911. Pt voiced understanding.

## 2024-05-11 NOTE — Care Management Obs Status (Signed)
 MEDICARE OBSERVATION STATUS NOTIFICATION   Patient Details  Name: Jean Howell MRN: [id number] Date of Birth: 11-Aug-1956   Medicare Observation Status Notification Given:  Yes    Geraldina Klinefelter, RN 05/11/2024, 8:59 PM

## 2024-05-11 NOTE — ED Provider Notes (Signed)
 [place] EMERGENCY DEPARTMENT AT [facility] Provider Note   CSN: [id number] Arrival date & time: 05/11/24  1304     History  Chief Complaint  Patient presents with   abnormal stress test    Jean Howell is a 68 y.o. female.  Patient is a 68 year old female who presents to the emergency department with a chief complaint of requiring further workup secondary to an abnormal coronary CT.  It was noted that they were concerned that she had a pulmonary embolus.  Patient notes that she has had some associated exertional dyspnea.  She denies any associated chest pain.  She has had no abdominal pain, nausea, vomiting, diarrhea.  She denies any dizziness, lightheadedness or syncope.        Home Medications Prior to Admission medications   Medication Sig Start Date End Date Taking? Authorizing Provider  Abatacept  (ORENCIA  CLICKJECT) 125 MG/ML SOAJ Inject 125 mg into the skin once a week. 04/06/24  Yes Deveshwar, Clydie Darter, MD  acetaminophen  (TYLENOL ) 650 MG CR tablet Take 1,300 mg by mouth every 8 (eight) hours as needed for pain.   Yes [provider]  betamethasone dipropionate (DIPROLENE) 0.05 % ointment Apply topically as needed.   Yes [provider]  Calcium -Magnesium-Zinc (CAL-MAG-ZINC PO) Take 1 tablet by mouth daily.   Yes [provider]  cyanocobalamin  (VITAMIN B12) 1000 MCG tablet Take 1 tablet by mouth daily. 12/16/21  Yes [provider]  Ergocalciferol  (VITAMIN D2) 10 MCG (400 UNIT) TABS Take 1 tablet by mouth daily. 12/16/21  Yes [provider]  fluticasone  (FLONASE ) 50 MCG/ACT nasal spray USE 2 SPRAYS IN EACH NOSTRIL DAILY Patient taking differently: Place 2 sprays into both nostrils as needed. 04/18/23  Yes Gottschalk, Sharolyn Decant M, DO  gabapentin  (NEURONTIN ) 600 MG tablet Take 1 tablet (600 mg total) by mouth at bedtime. **dose change 01/03/24  Yes Gottschalk, Ashly M, DO  leflunomide  (ARAVA ) 20 MG tablet TAKE ONE (1) TABLET BY  MOUTH EVERY DAY 04/09/24  Yes Deveshwar, Clydie Darter, MD  losartan -hydrochlorothiazide  (HYZAAR) 100-12.5 MG tablet TAKE ONE (1) TABLET BY MOUTH EVERY DAY 01/03/24  Yes Vicky Grange M, DO  Multiple Vitamin (MULTIVITAMIN PO) Take 1 tablet by mouth daily.   Yes [provider]  rosuvastatin  (CRESTOR ) 10 MG tablet Take 1 tablet (10 mg total) by mouth daily. 01/03/24  Yes Gottschalk, Sharolyn Decant M, DO  Suvorexant  (BELSOMRA ) 10 MG TABS Take 10 mg by mouth at bedtime as needed (sleep). Place on hold 09/07/22  Yes Vicky Grange M, DO  VITAMIN E  PO Take by mouth daily.   Yes [provider]  metoprolol  tartrate (LOPRESSOR ) 50 MG tablet Take tablet (50mg ) by mouth TWO hours prior to your cardiac CT scan. Patient not taking: Reported on 05/11/2024 05/08/24   Mallipeddi, Kennyth Pean, MD  UNABLE TO FIND Med Name: CBD roll on, as needed    [provider]      Allergies    Enbrel  [etanercept ]    Review of Systems   Review of Systems  Respiratory:  Positive for shortness of breath.     Physical Exam Updated Vital Signs BP (!) 156/85 (BP Location: Left Arm)   Pulse 79   Temp 98.4 F (36.9 C) (Oral)   Resp 18   Ht 5' 3 (1.6 m)   Wt 98.9 kg   SpO2 98%   BMI 38.62 kg/m  Physical Exam Vitals and nursing note reviewed.  Constitutional:      Appearance: Normal appearance.  HENT:  Head: Normocephalic and atraumatic.     Nose: Nose normal.     Mouth/Throat:     Mouth: Mucous membranes are moist.  Eyes:     Extraocular Movements: Extraocular movements intact.     Conjunctiva/sclera: Conjunctivae normal.     Pupils: Pupils are equal, round, and reactive to light.  Cardiovascular:     Rate and Rhythm: Normal rate and regular rhythm.     Pulses: Normal pulses.     Heart sounds: Normal heart sounds. No murmur heard.    No gallop.  Pulmonary:     Effort: Pulmonary effort is normal. No respiratory distress.     Breath sounds: Normal breath sounds. No stridor. No wheezing, rhonchi  or rales.  Abdominal:     General: Abdomen is flat. Bowel sounds are normal. There is no distension.     Palpations: Abdomen is soft.     Tenderness: There is no abdominal tenderness. There is no guarding.  Musculoskeletal:        General: Normal range of motion.     Cervical back: Normal range of motion and neck supple.     Right lower leg: No edema.     Left lower leg: No edema.  Skin:    General: Skin is warm and dry.     Findings: No bruising or rash.  Neurological:     General: No focal deficit present.     Mental Status: She is alert and oriented to person, place, and time. Mental status is at baseline.  Psychiatric:        Mood and Affect: Mood normal.        Behavior: Behavior normal.        Thought Content: Thought content normal.        Judgment: Judgment normal.     ED Results / Procedures / Treatments   Labs (all labs ordered are listed, but only abnormal results are displayed) Labs Reviewed  CBC WITH DIFFERENTIAL/PLATELET - Abnormal; Notable for the following components:      Result Value   RBC 3.86 (*)    Hemoglobin 10.8 (*)    HCT 33.0 (*)    RDW 17.3 (*)    All other components within normal limits  COMPREHENSIVE METABOLIC PANEL WITH GFR - Abnormal; Notable for the following components:   Albumin 2.8 (*)    All other components within normal limits  PROTIME-INR  APTT  TROPONIN I (HIGH SENSITIVITY)    EKG EKG Interpretation Date/Time:  Friday May 11 2024 14:22:21 EDT Ventricular Rate:  79 PR Interval:  166 QRS Duration:  110 QT Interval:  420 QTC Calculation: 482 R Axis:   54  Text Interpretation: Sinus rhythm Borderline T abnormalities, anterior leads Confirmed by Shyrl Doyne [phone]) on 05/11/2024 4:52:47 PM  Radiology CT Angio Chest PE W/Cm &/Or Wo Cm Result Date: 05/11/2024 CLINICAL DATA:  Syncope.  Concern for pulmonary embolism. EXAM: CT ANGIOGRAPHY CHEST WITH CONTRAST TECHNIQUE: Multidetector CT imaging of the chest was performed using  the standard protocol during bolus administration of intravenous contrast. Multiplanar CT image reconstructions and MIPs were obtained to evaluate the vascular anatomy. RADIATION DOSE REDUCTION: This exam was performed according to the departmental dose-optimization program which includes automated exposure control, adjustment of the mA and/or kV according to patient size and/or use of iterative reconstruction technique. CONTRAST:  75mL OMNIPAQUE  IOHEXOL  350 MG/ML SOLN COMPARISON:  Cardiac CT dated 05/09/2024. FINDINGS: Cardiovascular: Borderline cardiomegaly. No pericardial effusion. Mild atherosclerotic calcification of the thoracic aorta.  No aneurysmal dilatation or dissection. The origins of the great vessels of the aortic arch appear patent. Moderate amount of thrombus in the distal right main pulmonary artery extending to the lobar and segmental branches of the right upper and right lower lobes. Mildly dilated right ventricle with RV/LV ratio in the range of 0.7-1.0. Correlation with EKG and troponin levels recommended to evaluate for right heart straining. Mediastinum/Nodes: No hilar or mediastinal adenopathy. The esophagus is grossly unremarkable. No mediastinal fluid collection. Lungs/Pleura: Bibasilar subpleural reticulation and honeycombing consistent with fibrosis. Small left upper lobe calcified granuloma. There is a 4 mm right upper lobe nodule. No focal consolidation, pleural effusion or pneumothorax. The central airways are patent. Upper Abdomen: No acute abnormality. Musculoskeletal: Osteopenia with degenerative changes. No acute osseous pathology. Review of the MIP images confirms the above findings. IMPRESSION: 1. Right pulmonary artery embolus with borderline right heart strain. Correlation with EKG and troponin levels recommended. 2. Bibasilar fibrosis. 3.  Aortic Atherosclerosis (ICD10-I70.0). These results were called by telephone at the time of interpretation on 05/11/2024 at 4:48 pm to  provider Lazariah Savard , who verbally acknowledged these results. Electronically Signed   By: Angus Bark M.D.   On: 05/11/2024 16:58    Procedures Procedures    Medications Ordered in ED Medications  enoxaparin  (LOVENOX ) injection 100 mg (has no administration in time range)  iohexol  (OMNIPAQUE ) 350 MG/ML injection 75 mL (75 mLs Intravenous Contrast Given 05/11/24 1620)    ED Course/ Medical Decision Making/ A&P                                 Medical Decision Making Amount and/or Complexity of Data Reviewed Labs: ordered. Radiology: ordered.  Risk Prescription drug management. Decision regarding hospitalization.   This patient presents to the ED for concern of exertional dyspnea, this involves an extensive number of treatment options, and is a complaint that carries with it a high risk of complications and morbidity.  The differential diagnosis includes ACS, pulmonary embolus, pericarditis, myocarditis, endocarditis, pneumothorax, hemothorax, pneumonia   Co morbidities that complicate the patient evaluation  None   Additional history obtained:  Additional history obtained from family External records from outside source obtained and reviewed including medical records   Lab Tests:  I Ordered, and personally interpreted labs.  The pertinent results include: No leukocytosis, mild anemia, normal kidney function liver function, normal electrolytes, negative troponin   Imaging Studies ordered:  I ordered imaging studies including CT of the chest I independently visualized and interpreted imaging which showed right-sided pulmonary embolus with borderline heart strain I agree with the radiologist interpretation   Cardiac Monitoring: / EKG:  The patient was maintained on a cardiac monitor.  I personally viewed and interpreted the cardiac monitored which showed an underlying rhythm of: Normal sinus rhythm, no ST/T wave changes, no ischemic changes, no  STEMI   Consultations Obtained:  I requested consultation with the hospitalist,  and discussed lab and imaging findings as well as pertinent plan - they recommend: Admission   Problem List / ED Course / Critical interventions / Medication management  Patient is doing well at this time.  Discussed with patient we will plan for admission to the hospital service given her new pulmonary embolus and borderline heart strain.  EKG was unremarkable and troponin was within normal limits.  Patient does have a mild anemia with no other acute changes on blood work.  She is  not requiring any oxygen at this point is otherwise well-appearing.  She has no signs of acute respiratory distress.  Have discussed patient case with Dr. Osborne Blazer with the hospitalist service who has excepted for admission at this time. I ordered medication including Lovenox  for pulmonary embolus Reevaluation of the patient after these medicines showed that the patient improved I have reviewed the patients home medicines and have made adjustments as needed   Social Determinants of Health:  None   Test / Admission - Considered:  Admission        Final Clinical Impression(s) / ED Diagnoses Final diagnoses:  None    Rx / DC Orders ED Discharge Orders     None         Emmalene Hare 05/11/24 1746    Ninetta Basket, MD 05/16/24 1228

## 2024-05-12 ENCOUNTER — Other Ambulatory Visit (HOSPITAL_COMMUNITY): Payer: Self-pay | Admitting: *Deleted

## 2024-05-12 ENCOUNTER — Observation Stay (HOSPITAL_BASED_OUTPATIENT_CLINIC_OR_DEPARTMENT_OTHER)

## 2024-05-12 DIAGNOSIS — I2609 Other pulmonary embolism with acute cor pulmonale: Secondary | ICD-10-CM

## 2024-05-12 DIAGNOSIS — I82409 Acute embolism and thrombosis of unspecified deep veins of unspecified lower extremity: Secondary | ICD-10-CM | POA: Diagnosis present

## 2024-05-12 DIAGNOSIS — I2699 Other pulmonary embolism without acute cor pulmonale: Secondary | ICD-10-CM

## 2024-05-12 LAB — HIV ANTIBODY (ROUTINE TESTING W REFLEX): HIV Screen 4th Generation wRfx: NONREACTIVE

## 2024-05-12 LAB — BASIC METABOLIC PANEL WITH GFR
Anion gap: 5 (ref 5–15)
BUN: 11 mg/dL (ref 8–23)
CO2: 29 mmol/L (ref 22–32)
Calcium: 8.6 mg/dL — ABNORMAL LOW (ref 8.9–10.3)
Chloride: 103 mmol/L (ref 98–111)
Creatinine, Ser: 0.88 mg/dL (ref 0.44–1.00)
GFR, Estimated: >60 mL/min (ref >60)
Glucose, Bld: 92 mg/dL (ref 70–99)
Potassium: 3.4 mmol/L — ABNORMAL LOW (ref 3.5–5.1)
Sodium: 137 mmol/L (ref 135–145)

## 2024-05-12 LAB — CBC
HCT: 30.8 % — ABNORMAL LOW (ref 36.0–46.0)
Hemoglobin: 9.7 g/dL — ABNORMAL LOW (ref 12.0–15.0)
MCH: 26.7 pg (ref 26.0–34.0)
MCHC: 31.5 g/dL (ref 30.0–36.0)
MCV: 84.8 fL (ref 80.0–100.0)
Platelets: 236 10*3/uL (ref 150–400)
RBC: 3.63 MIL/uL — ABNORMAL LOW (ref 3.87–5.11)
RDW: 17.2 % — ABNORMAL HIGH (ref 11.5–15.5)
WBC: 3.9 10*3/uL — ABNORMAL LOW (ref 4.0–10.5)
nRBC: 0 % (ref 0.0–0.2)

## 2024-05-12 MED ORDER — APIXABAN 5 MG PO TABS: ORAL_TABLET | ORAL | 3 refills | Status: DC

## 2024-05-12 MED ORDER — VITAMIN E 180 MG (400 UNIT) PO CAPS
400.0000 [IU] | ORAL_CAPSULE | Freq: Every day | ORAL | Status: DC
  Administered 2024-05-12: 400 [IU] via ORAL
  Filled 2024-05-12: qty 1

## 2024-05-12 MED ORDER — APIXABAN 5 MG PO TABS: 5.0000 mg | ORAL_TABLET | Freq: Two times a day (BID) | ORAL | Status: DC

## 2024-05-12 MED ORDER — APIXABAN 5 MG PO TABS
10.0000 mg | ORAL_TABLET | Freq: Two times a day (BID) | ORAL | Status: DC
  Administered 2024-05-12 – 2024-05-13 (×2): 10 mg via ORAL
  Filled 2024-05-12 (×4): qty 2

## 2024-05-12 MED ORDER — PERFLUTREN LIPID MICROSPHERE
1.0000 mL | INTRAVENOUS | Status: AC | PRN
  Administered 2024-05-12: 3 mL via INTRAVENOUS

## 2024-05-12 MED ORDER — POTASSIUM CHLORIDE CRYS ER 20 MEQ PO TBCR
40.0000 meq | EXTENDED_RELEASE_TABLET | Freq: Once | ORAL | Status: AC
  Administered 2024-05-12: 40 meq via ORAL
  Filled 2024-05-12: qty 2

## 2024-05-12 NOTE — Progress Notes (Addendum)
 Pt vomited after getting up to go into the BR. No c/o pain, smile symmetrical, bilateral grip equal, pedal strength equal, A&O x 4. Sensation equal bilaterally in all extremeties.    05/12/24 2009  Vitals  Temp 98.4 F (36.9 C)  Temp Source Oral  BP (!) 154/77  MAP (mmHg) 101  BP Location Left Arm  BP Method Automatic  Patient Position (if appropriate) Lying  Pulse Rate 86  Pulse Rate Source Monitor  Resp 18  MEWS COLOR  MEWS Score Color Green  Oxygen Therapy  SpO2 94 %  O2 Device Room Air  MEWS Score  MEWS Temp 0  MEWS Systolic 0  MEWS Pulse 0  MEWS RR 0  MEWS LOC 0  MEWS Score 0

## 2024-05-12 NOTE — Discharge Instructions (Signed)
 Information on my medicine - ELIQUIS (apixaban)   Why was Eliquis prescribed for you? Eliquis was prescribed to treat blood clots that may have been found in the veins of your legs (deep vein thrombosis) or in your lungs (pulmonary embolism) and to reduce the risk of them occurring again.  What do You need to know about Eliquis ? The starting dose is 10 mg (two 5 mg tablets) taken TWICE daily for the FIRST SEVEN (7) DAYS, then on 05/19/24  the dose is reduced to ONE 5 mg tablet taken TWICE daily.  Eliquis may be taken with or without food.   Try to take the dose about the same time in the morning and in the evening. If you have difficulty swallowing the tablet whole please discuss with your pharmacist how to take the medication safely.  Take Eliquis exactly as prescribed and DO NOT stop taking Eliquis without talking to the doctor who prescribed the medication.  Stopping may increase your risk of developing a new blood clot.  Refill your prescription before you run out.  After discharge, you should have regular check-up appointments with your healthcare provider that is prescribing your Eliquis.    What do you do if you miss a dose? If a dose of ELIQUIS is not taken at the scheduled time, take it as soon as possible on the same day and twice-daily administration should be resumed. The dose should not be doubled to make up for a missed dose.  Important Safety Information A possible side effect of Eliquis is bleeding. You should call your healthcare provider right away if you experience any of the following: Bleeding from an injury or your nose that does not stop. Unusual colored urine (red or dark brown) or unusual colored stools (red or black). Unusual bruising for unknown reasons. A serious fall or if you hit your head (even if there is no bleeding).  Some medicines may interact with Eliquis and might increase your risk of bleeding or clotting while on Eliquis. To help avoid  this, consult your healthcare provider or pharmacist prior to using any new prescription or non-prescription medications, including herbals, vitamins, non-steroidal anti-inflammatory drugs (NSAIDs) and supplements.  This website has more information on Eliquis (apixaban): http://www.eliquis.com/eliquis/home

## 2024-05-12 NOTE — Progress Notes (Signed)
 Mobility Specialist Progress Note:    05/12/24 1030  Mobility  Activity Ambulated with assistance in hallway  Level of Assistance Standby assist, set-up cues, supervision of patient - no hands on  Assistive Device Front wheel walker  Distance Ambulated (ft) 100 ft  Range of Motion/Exercises Active;All extremities  Activity Response Tolerated well  Mobility Referral Yes  Mobility visit 1 Mobility  Mobility Specialist Start Time (ACUTE ONLY) 1030  Mobility Specialist Stop Time (ACUTE ONLY) 1100  Mobility Specialist Time Calculation (min) (ACUTE ONLY) 30 min   Pt received in chair, agreeable to mobility. Required SBA to stand and ambulate with RW. Tolerated well,asx throughout. Left pt sitting EOB, all needs met.  [place] Win Mobility Specialist Please contact via Special educational needs teacher or  Rehab office at [phone]

## 2024-05-12 NOTE — Progress Notes (Unsigned)
 Patient eating lunch at 12:26. Will check later.     Denese Finn, RCS

## 2024-05-12 NOTE — Hospital Course (Signed)
 Jean Howell  is a 69 y.o. female, with past medical history of hypertension, hyperlipidemia, rheumatoid arthritis, obesity, patient is retired, patient had a CTA coronary by cardiology, and some findings were concerning for PE, so she was notified by urologist to come to ED for evaluation, she is herself denies any dyspnea, chest pain, shortness of breath, leg pain, leg edema, and recent surgery or recent travel, CTA chest in ED was significant for PE with right heart strain, she ports she is up-to-date in her colonoscopy, and mammograms, she was started on Lovenox  full dose treatment, and [name] hospitalist requested to admit for further evaluation.    Pulmonary embolism/ DVT  - Appears unprovoked - ECHO:  - Lower extremity Venous Doppler -   Small amount of clot in the left popliteal vein has the appearance of chronic thrombus. No additional or evidence of acute lower extremity DVT.   - Was started to Lovenox , transitioning to Eliquis  Recommending minimum of 3 months of anticoagulation treatment  - Unprovoked PE, no significant family history of VTE, as she reports 2 nieces and 1 nephew with VTE, to follow-up with hematology as an outpatient   History of rheumatoid arthritis - Appears to be on Orencia  injection weekly and Arava , continue with Arava  during hospital stay   Hypertension - Continue with home medications   Hyperlipidemia - Continue with home statin  Hypokalemia -Replaced

## 2024-05-12 NOTE — Discharge Summary (Signed)
 Physician Discharge Summary   Patient: Jean Howell MRN: [id number] DOB: 07/15/1956  Admit date:     05/11/2024  Discharge date: 05/12/24  Discharge Physician: Bobbetta Burnet   PCP: Eliodoro Guerin, DO   Recommendations at discharge:   Follow-up with PCP in 2-4 weeks Recommend to continue with Eliquis for minimum of 3 months, follow-up with PCP with possible referral to hematologist to further investigate the cause of PE/DVT Instruction to avoid NSAIDs such as aspirin , ibuprofen, naproxen-products to avoid an increased risk of bleeding of bleeding  Discharge Diagnoses: Principal Problem:   Pulmonary emboli (HCC) Active Problems:   Morbid obesity (HCC)   Essential hypertension   Rheumatoid arthritis involving multiple sites with positive rheumatoid factor (HCC)   HLD (hyperlipidemia)   DVT (deep venous thrombosis) (HCC)  Resolved Problems:   * No resolved hospital problems. *  Hospital Course:  Jean Howell  is a 68 y.o. female, with past medical history of hypertension, hyperlipidemia, rheumatoid arthritis, obesity, patient is retired, patient had a CTA coronary by cardiology, and some findings were concerning for PE, so she was notified by urologist to come to ED for evaluation, she is herself denies any dyspnea, chest pain, shortness of breath, leg pain, leg edema, and recent surgery or recent travel, CTA chest in ED was significant for PE with right heart strain, she ports she is up-to-date in her colonoscopy, and mammograms, she was started on Lovenox  full dose treatment, and [name] hospitalist requested to admit for further evaluation.    Pulmonary embolism/ DVT  - Appears unprovoked - ECHO:  - Lower extremity Venous Doppler -   Small amount of clot in the left popliteal vein has the appearance of chronic thrombus. No additional or evidence of acute lower extremity DVT.   - Was started to Lovenox , transitioning to Eliquis  Recommending minimum of 3 months of  anticoagulation treatment  - Unprovoked PE, no significant family history of VTE, as she reports 2 nieces and 1 nephew with VTE, to follow-up with hematology as an outpatient   History of rheumatoid arthritis - Appears to be on Orencia  injection weekly and Arava , continue with Arava  during hospital stay   Hypertension - Continue with home medications   Hyperlipidemia - Continue with home statin  Hypokalemia -Replaced     Procedures performed: Echocardiogram/bilateral extremity ultrasound Disposition: Home Diet recommendation:  Discharge Diet Orders (From admission, onward)     Start     Ordered   05/12/24 0000  Diet - low sodium heart healthy        05/12/24 1120           Cardiac diet DISCHARGE MEDICATION: Allergies as of 05/12/2024       Reactions   Enbrel  [etanercept ] Rash   Psoriasis        Medication List     STOP taking these medications    metoprolol  tartrate 50 MG tablet Commonly known as: LOPRESSOR        TAKE these medications    acetaminophen  650 MG CR tablet Commonly known as: TYLENOL  Take 1,300 mg by mouth every 8 (eight) hours as needed for pain.   apixaban 5 MG Tabs tablet Commonly known as: ELIQUIS Take 2 tablets (10 mg total) by mouth 2 (two) times daily for 7 days, THEN 1 tablet (5 mg total) 2 (two) times daily. Start taking on: May 12, 2024   Belsomra  10 MG Tabs Generic drug: Suvorexant  Take 10 mg by mouth at bedtime as needed (sleep). Place  on hold   betamethasone dipropionate 0.05 % ointment Commonly known as: DIPROLENE Apply topically as needed.   CAL-MAG-ZINC PO Take 1 tablet by mouth daily.   cyanocobalamin  1000 MCG tablet Commonly known as: VITAMIN B12 Take 1 tablet by mouth daily.   fluticasone  50 MCG/ACT nasal spray Commonly known as: FLONASE  USE 2 SPRAYS IN EACH NOSTRIL DAILY What changed:  when to take this reasons to take this   gabapentin  600 MG tablet Commonly known as: Neurontin  Take 1 tablet (600  mg total) by mouth at bedtime. **dose change   leflunomide  20 MG tablet Commonly known as: ARAVA  TAKE ONE (1) TABLET BY MOUTH EVERY DAY   losartan -hydrochlorothiazide  100-12.5 MG tablet Commonly known as: HYZAAR TAKE ONE (1) TABLET BY MOUTH EVERY DAY   MULTIVITAMIN PO Take 1 tablet by mouth daily.   Orencia  ClickJect 125 MG/ML Soaj Generic drug: Abatacept  Inject 125 mg into the skin once a week.   rosuvastatin  10 MG tablet Commonly known as: Crestor  Take 1 tablet (10 mg total) by mouth daily.   UNABLE TO FIND Med Name: CBD roll on, as needed   Vitamin D2 10 MCG (400 UNIT) Tabs Take 1 tablet by mouth daily.   VITAMIN E  PO Take by mouth daily.        Discharge Exam: Filed Weights   05/11/24 1344 05/11/24 1839  Weight: 98.9 kg 96.8 kg        General:  AAO x 3,  cooperative, no distress;   HEENT:  Normocephalic, PERRL, otherwise with in Normal limits   Neuro:  CNII-XII intact. , normal motor and sensation, reflexes intact   Lungs:   Clear to auscultation BL, Respirations unlabored,  No wheezes / crackles  Cardio:    S1/S2, RRR, No murmure, No Rubs or Gallops   Abdomen:  Soft, non-tender, bowel sounds active all four quadrants, no guarding or peritoneal signs.  Muscular  skeletal:  Limited exam -global generalized weaknesses - in bed, able to move all 4 extremities,   2+ pulses,  symmetric, No pitting edema  Skin:  Dry, warm to touch, negative for any Rashes,  Wounds: Please see nursing documentation          Condition at discharge: good  The results of significant diagnostics from this hospitalization (including imaging, microbiology, ancillary and laboratory) are listed below for reference.   Imaging Studies: US  Venous Img Lower Bilateral (DVT) Result Date: 05/11/2024 CLINICAL DATA:  Pulmonary embolus. EXAM: BILATERAL LOWER EXTREMITY VENOUS DOPPLER ULTRASOUND TECHNIQUE: Gray-scale sonography with graded compression, as well as color Doppler and duplex  ultrasound were performed to evaluate the lower extremity deep venous systems from the level of the common femoral vein and including the common femoral, femoral, profunda femoral, popliteal and calf veins including the posterior tibial, peroneal and gastrocnemius veins when visible. The superficial great saphenous vein was also interrogated. Spectral Doppler was utilized to evaluate flow at rest and with distal augmentation maneuvers in the common femoral, femoral and popliteal veins. COMPARISON:  None Available. FINDINGS: RIGHT LOWER EXTREMITY Common Femoral Vein: No evidence of thrombus. Normal compressibility, respiratory phasicity and response to augmentation. Saphenofemoral Junction: No evidence of thrombus. Normal compressibility and flow on color Doppler imaging. Profunda Femoral Vein: No evidence of thrombus. Normal compressibility and flow on color Doppler imaging. Femoral Vein: No evidence of thrombus. Normal compressibility, respiratory phasicity and response to augmentation. Popliteal Vein: No evidence of thrombus. Normal compressibility, respiratory phasicity and response to augmentation. Calf Veins: No evidence of thrombus. Normal  compressibility and flow on color Doppler imaging. Superficial Great Saphenous Vein: No evidence of thrombus. Normal compressibility. Venous Reflux:  None. Other Findings:  None. LEFT LOWER EXTREMITY Common Femoral Vein: No evidence of thrombus. Normal compressibility, respiratory phasicity and response to augmentation. Saphenofemoral Junction: No evidence of thrombus. Normal compressibility and flow on color Doppler imaging. Profunda Femoral Vein: No evidence of thrombus. Normal compressibility and flow on color Doppler imaging. Femoral Vein: No evidence of thrombus. Normal compressibility, respiratory phasicity and response to augmentation. Popliteal Vein: Small amount of adherent/peripheral clot in the popliteal vein is consistent with sequela of chronic/remote thrombus.  Calf Veins: No evidence of thrombus. Normal compressibility and flow on color Doppler imaging. Superficial Great Saphenous Vein: No evidence of thrombus. Normal compressibility. Venous Reflux:  None. Other Findings:  None. IMPRESSION: 1. Small amount of clot in the left popliteal vein has the appearance of chronic thrombus. 2. No additional or evidence of acute lower extremity DVT. Electronically Signed   By: Chadwick Colonel M.D.   On: 05/11/2024 18:21   CT Angio Chest PE W/Cm &/Or Wo Cm Result Date: 05/11/2024 CLINICAL DATA:  Syncope.  Concern for pulmonary embolism. EXAM: CT ANGIOGRAPHY CHEST WITH CONTRAST TECHNIQUE: Multidetector CT imaging of the chest was performed using the standard protocol during bolus administration of intravenous contrast. Multiplanar CT image reconstructions and MIPs were obtained to evaluate the vascular anatomy. RADIATION DOSE REDUCTION: This exam was performed according to the departmental dose-optimization program which includes automated exposure control, adjustment of the mA and/or kV according to patient size and/or use of iterative reconstruction technique. CONTRAST:  75mL OMNIPAQUE  IOHEXOL  350 MG/ML SOLN COMPARISON:  Cardiac CT dated 05/09/2024. FINDINGS: Cardiovascular: Borderline cardiomegaly. No pericardial effusion. Mild atherosclerotic calcification of the thoracic aorta. No aneurysmal dilatation or dissection. The origins of the great vessels of the aortic arch appear patent. Moderate amount of thrombus in the distal right main pulmonary artery extending to the lobar and segmental branches of the right upper and right lower lobes. Mildly dilated right ventricle with RV/LV ratio in the range of 0.7-1.0. Correlation with EKG and troponin levels recommended to evaluate for right heart straining. Mediastinum/Nodes: No hilar or mediastinal adenopathy. The esophagus is grossly unremarkable. No mediastinal fluid collection. Lungs/Pleura: Bibasilar subpleural reticulation and  honeycombing consistent with fibrosis. Small left upper lobe calcified granuloma. There is a 4 mm right upper lobe nodule. No focal consolidation, pleural effusion or pneumothorax. The central airways are patent. Upper Abdomen: No acute abnormality. Musculoskeletal: Osteopenia with degenerative changes. No acute osseous pathology. Review of the MIP images confirms the above findings. IMPRESSION: 1. Right pulmonary artery embolus with borderline right heart strain. Correlation with EKG and troponin levels recommended. 2. Bibasilar fibrosis. 3.  Aortic Atherosclerosis (ICD10-I70.0). These results were called by telephone at the time of interpretation on 05/11/2024 at 4:48 pm to provider CHRISTOPHER RIGNEY , who verbally acknowledged these results. Electronically Signed   By: Angus Bark M.D.   On: 05/11/2024 16:58   CT CORONARY MORPH W/CTA COR W/SCORE W/CA W/CM &/OR WO/CM Addendum Date: 05/11/2024 ADDENDUM REPORT: 05/11/2024 11:13 ADDENDUM: Critical Value/emergent results were called by telephone at the time of interpretation on 05/11/2024 at 11:12 am to provider Dr. Armida Lander, who verbally acknowledged these results. Electronically Signed   By: Fredrich Jefferson M.D.   On: 05/11/2024 11:13   Addendum Date: 05/11/2024 ADDENDUM REPORT: 05/11/2024 10:34 ADDENDUM: OVER-READ INTERPRETATION  CT CHEST The following report is an over-read performed by radiologist Dr. Avanell [facility]  on 02/24/2024. This over-read does not include interpretation of cardiac or coronary anatomy or pathology. The interpretation by the cardiologist is attached. COMPARISON:  None. FINDINGS: Within the visualized portions of the lungs demonstrates no acute infiltrates consolidations. Bilateral basilar bronchiectasis. Prominence of the interstitial markings of the bases of the lungs correlate with chronic interstitial lung disease and COPD changes. Although the study is not tailored for opacification of the pulmonary  arteries there is a suggestion of an intraluminal filling defects within the right distal main pulmonary artery extending into the interlobar region of the right main pulmonary artery. Findings are highly suspicious for pulmonary embolism. Refer to series 401 images 60 through 75, these filling defects appears attached to the anterolateral wall of the pulmonary artery and may indicate a chronic pulmonary embolus. No pulmonary nodules or masses. No pleural effusions. No mediastinal masses or adenopathy. No pericardial effusions. Aorta appears normal caliber. No acute findings in the upper abdomen. Chest wall and soft tissues are unremarkable without evidence of acute bony abnormalities. Coronary artery calcifications IMPRESSION: *Findings highly suspicious for pulmonary embolism in the right distal main pulmonary artery extending into the interlobar region of the right main pulmonary artery. *Chronic interstitial lung disease and COPD changes. *Coronary artery calcifications. Electronically Signed   By: Fredrich Jefferson M.D.   On: 05/11/2024 10:34   Result Date: 05/11/2024 CLINICAL DATA:  Chest pain EXAM: Cardiac CTA MEDICATIONS: Sub lingual nitro. 4mg  x 2 TECHNIQUE: A non-contrast, gated CT scan was obtained with axial slices of 2.5 mm through the heart for calcium  scoring. Calcium  scoring was performed using the Agatston method. A 120 kV prospective, gated, contrast cardiac CT scan was obtained. Gantry rotation speed was 230 msec and collimation was 0.63 mm. Two sublingual nitroglycerin  tablets (0.8 mg) were given. The 3D data set was reconstructed with motion correction for the best systolic or diastolic phase. Images were analyzed on a dedicated workstation using MPR, MIP, and VRT modes. The patient received 95 cc contrast. FINDINGS: Non-cardiac: See separate report from [facility]. Normal caliber thoracic aorta. Pulmonary veins drain normally to the left atrium. No LA appendage thrombus. Calcium  Score:  82.6 Agatson units. Coronary Arteries: Right dominant with no anomalies LM: No plaque or stenosis. LAD system: No plaque or stenosis. Circumflex system: Mixed plaque proximal LCx with mild (1-24%) stenosis. RCA system: Calcified plaque proximal RCA, mild (1-24%) stenosis. IMPRESSION: 1. Coronary artery calcium  score 82.6 Agatston units. This places the patient in the 77th percentile for age and gender, suggesting intermediate risk for future cardiac events. 2.  Mild nonobstructive coronary disease. [employer] account executive Electronically Signed: By: Peder Bourdon M.D. On: 05/10/2024 16:08    Microbiology: Results for orders placed or performed during the hospital encounter of 07/02/22  Surgical pcr screen     Status: None   Collection Time: 07/02/22  2:20 PM   Specimen: Nasal Mucosa; Nasal Swab  Result Value Ref Range Status   MRSA, PCR NEGATIVE NEGATIVE Final   Staphylococcus aureus NEGATIVE NEGATIVE Final    Comment: (NOTE) The Xpert SA Assay (FDA approved for NASAL specimens in patients 85 years of age and older), is one component of a comprehensive surveillance program. It is not intended to diagnose infection nor to guide or monitor treatment. Performed at [facility], [address] [place], [place] [address]     Labs: CBC: Recent Labs  Lab 05/11/24 1432 05/12/24 0231  WBC 5.4 3.9*  NEUTROABS 3.6  --   HGB 10.8* 9.7*  HCT 33.0* 30.8*  MCV 85.5 84.8  PLT 264 236   Basic Metabolic Panel: Recent Labs  Lab 05/11/24 1546 05/12/24 0231  NA 138 137  K 3.5 3.4*  CL 102 103  CO2 26 29  GLUCOSE 85 92  BUN 8 11  CREATININE 0.87 0.88  CALCIUM  8.9 8.6*   Liver Function Tests: Recent Labs  Lab 05/11/24 1546  AST 32  ALT 21  ALKPHOS 60  BILITOT 0.5  PROT 7.2  ALBUMIN 2.8*   CBG: No results for input(s): "GLUCAP" in the last 168 hours.  Discharge time spent: greater than 30 minutes.  Signed: Bobbetta Burnet, MD [facility] 05/12/2024

## 2024-05-12 NOTE — Plan of Care (Signed)

## 2024-05-12 NOTE — Consult Note (Signed)
 PHARMACY - ANTICOAGULATION CONSULT NOTE  Pharmacy Consult for Lovenox >>apixaban Indication: pulmonary embolus  Patient Measurements: Height: 5\' 3"  (160 cm) Weight: 96.8 kg (213 lb 6.4 oz) IBW/kg (Calculated) : 52.4 HEPARIN DW (KG): 74.9  Labs: Recent Labs    05/11/24 1432 05/11/24 1546 05/11/24 1650 05/12/24 0231  HGB 10.8*  --   --  9.7*  HCT 33.0*  --   --  30.8*  PLT 264  --   --  236  APTT  --   --  25  --   LABPROT  --   --  13.1  --   INR  --   --  1.0  --   CREATININE  --  0.87  --  0.88  TROPONINIHS 4  --   --   --     Estimated Creatinine Clearance: 66.9 mL/min (by C-G formula based on SCr of 0.88 mg/dL).   Medical History: Past Medical History:  Diagnosis Date   Arthritis    Depression    Hypertension    Rheumatoid arthritis (HCC)     Medications:  No apparent anticoagulation prior to admission per my chart review  Assessment: Patient is a 68 y/o F with medical history as above being admitted with acute PE. Pharmacy consulted to transition from enoxaparin  to apixaban.   Enoxaparin  given this am, will start apixaban this evening. Hgb down slightly to 9.7, plt within normal limits. No bleeding issues noted.   Plan:  Stop enoxaparin  Start apixaban 10mg  bid x 7 days then 5 mg bid   Audra Blend PharmD., BCPS Clinical Pharmacist 05/12/2024 7:47 AM

## 2024-05-12 NOTE — Plan of Care (Signed)
  Problem: Clinical Measurements: Goal: Ability to maintain clinical measurements within normal limits will improve Outcome: Progressing Goal: Will remain free from infection Outcome: Progressing Goal: Diagnostic test results will improve Outcome: Progressing Goal: Cardiovascular complication will be avoided Outcome: Progressing   Problem: Activity: Goal: Risk for activity intolerance will decrease Outcome: Progressing   Problem: Nutrition: Goal: Adequate nutrition will be maintained Outcome: Progressing   Problem: Coping: Goal: Level of anxiety will decrease Outcome: Progressing   Problem: Elimination: Goal: Will not experience complications related to bowel motility Outcome: Progressing Goal: Will not experience complications related to urinary retention Outcome: Progressing   Problem: Elimination: Goal: Will not experience complications related to urinary retention Outcome: Progressing   Problem: Pain Managment: Goal: General experience of comfort will improve and/or be controlled Outcome: Progressing   Problem: Safety: Goal: Ability to remain free from injury will improve Outcome: Progressing   Problem: Skin Integrity: Goal: Risk for impaired skin integrity will decrease Outcome: Progressing

## 2024-05-13 DIAGNOSIS — I2699 Other pulmonary embolism without acute cor pulmonale: Secondary | ICD-10-CM | POA: Diagnosis not present

## 2024-05-13 LAB — ECHOCARDIOGRAM COMPLETE
Area-P 1/2: 3.6 cm2
Height: 63 in
S' Lateral: 3.6 cm
Weight: 3414.4 [oz_av]

## 2024-05-13 MED ORDER — APIXABAN 5 MG PO TABS: ORAL_TABLET | ORAL | 3 refills | Status: DC

## 2024-05-13 NOTE — Progress Notes (Signed)
 Physician Discharge Summary   Patient: Jean Howell MRN: [id number] DOB: 1956-04-15  Admit date:     05/11/2024  Discharge date: 05/13/24  Discharge Physician: Bobbetta Burnet   PCP: Eliodoro Guerin, DO   The patient was seen and examined this morning, stable no acute distress denies any chest pain or shortness of breath  Echo reviewed in detail, no significant whiles right heart strain  Patient cleared for discharge   -------------------------------------------------------------------------------------------------------------------- Recommendations at discharge:   Follow-up with PCP in 2-4 weeks Recommend to continue with Eliquis for minimum of 3 months, follow-up with PCP with possible referral to hematologist to further investigate the cause of PE/DVT Instruction to avoid NSAIDs such as aspirin , ibuprofen, naproxen-products to avoid an increased risk of bleeding of bleeding Follow-up with cardiologist as an outpatient  Discharge Diagnoses: Principal Problem:   Pulmonary emboli [facility]) Active Problems:   Morbid obesity (HCC)   Essential hypertension   Rheumatoid arthritis involving multiple sites with positive rheumatoid factor (HCC)   HLD (hyperlipidemia)   DVT (deep venous thrombosis) (HCC)  Resolved Problems:   * No resolved hospital problems. *  Hospital Course:  Jean Howell  is a 68 y.o. female, with past medical history of hypertension, hyperlipidemia, rheumatoid arthritis, obesity, patient is retired, patient had a CTA coronary by cardiology, and some findings were concerning for PE, so she was notified by urologist to come to ED for evaluation, she is herself denies any dyspnea, chest pain, shortness of breath, leg pain, leg edema, and recent surgery or recent travel, CTA chest in ED was significant for PE with right heart strain, she ports she is up-to-date in her colonoscopy, and mammograms, she was started on Lovenox  full dose treatment, and [name] hospitalist  requested to admit for further evaluation.    Pulmonary embolism/ DVT  - Appears unprovoked  - Lower extremity Venous Doppler -   Small amount of clot in the left popliteal vein has the appearance of chronic thrombus. No additional or evidence of acute lower extremity DVT.   - Was started to Lovenox , transitioning to Eliquis  Recommending minimum of 3 months of anticoagulation treatment  - Unprovoked PE, no significant family history of VTE, as she reports 2 nieces and 1 nephew with VTE, to follow-up with hematology as an outpatient   ECHO: 50 to 55%.  No LVH, function normal. the left ventricle demonstrates  global hypokinesis.  Grade 1 diastolic dysfunction Overall valves within normal limits with mild calcification especially in aortic valve with no stenosis -No right heart strain-right ventricular size normal  - The aortic valve is tricuspid. There is mild calcification of the  aortic valve.  Aortic valve sclerosis is present, with  no evidence of aortic valve stenosis.         History of rheumatoid arthritis - Appears to be on Orencia  injection weekly and Arava , continue with Arava  during hospital stay   Hypertension - Continue with home medications   Hyperlipidemia - Continue with home statin  Hypokalemia -Replaced     Procedures performed: Echocardiogram/bilateral extremity ultrasound Disposition: Home Diet recommendation:  Discharge Diet Orders (From admission, onward)     Start     Ordered   05/12/24 0000  Diet - low sodium heart healthy        05/12/24 1120           Cardiac diet DISCHARGE MEDICATION: Allergies as of 05/13/2024       Reactions   Enbrel  [etanercept ] Rash   Psoriasis  Medication List     STOP taking these medications    metoprolol  tartrate 50 MG tablet Commonly known as: LOPRESSOR        TAKE these medications    acetaminophen  650 MG CR tablet Commonly known as: TYLENOL  Take 1,300 mg by mouth every 8 (eight)  hours as needed for pain.   apixaban 5 MG Tabs tablet Commonly known as: ELIQUIS Take 2 tablets (10 mg total) by mouth 2 (two) times daily for 7 days, THEN 1 tablet (5 mg total) 2 (two) times daily. Start taking on: May 12, 2024   Belsomra  10 MG Tabs Generic drug: Suvorexant  Take 10 mg by mouth at bedtime as needed (sleep). Place on hold   betamethasone dipropionate 0.05 % ointment Commonly known as: DIPROLENE Apply topically as needed.   CAL-MAG-ZINC PO Take 1 tablet by mouth daily.   cyanocobalamin  1000 MCG tablet Commonly known as: VITAMIN B12 Take 1 tablet by mouth daily.   fluticasone  50 MCG/ACT nasal spray Commonly known as: FLONASE  USE 2 SPRAYS IN EACH NOSTRIL DAILY What changed:  when to take this reasons to take this   gabapentin  600 MG tablet Commonly known as: Neurontin  Take 1 tablet (600 mg total) by mouth at bedtime. **dose change   leflunomide  20 MG tablet Commonly known as: ARAVA  TAKE ONE (1) TABLET BY MOUTH EVERY DAY   losartan -hydrochlorothiazide  100-12.5 MG tablet Commonly known as: HYZAAR TAKE ONE (1) TABLET BY MOUTH EVERY DAY   MULTIVITAMIN PO Take 1 tablet by mouth daily.   Orencia  ClickJect 125 MG/ML Soaj Generic drug: Abatacept  Inject 125 mg into the skin once a week.   rosuvastatin  10 MG tablet Commonly known as: Crestor  Take 1 tablet (10 mg total) by mouth daily.   UNABLE TO FIND Med Name: CBD roll on, as needed   Vitamin D2 10 MCG (400 UNIT) Tabs Take 1 tablet by mouth daily.   VITAMIN E  PO Take by mouth daily.        Discharge Exam: Filed Weights   05/11/24 1344 05/11/24 1839  Weight: 98.9 kg 96.8 kg         General:  AAO x 3,  cooperative, no distress;   HEENT:  Normocephalic, PERRL, otherwise with in Normal limits   Neuro:  CNII-XII intact. , normal motor and sensation, reflexes intact   Lungs:   Clear to auscultation BL, Respirations unlabored,  No wheezes / crackles  Cardio:    S1/S2, RRR, No murmure, No  Rubs or Gallops   Abdomen:  Soft, non-tender, bowel sounds active all four quadrants, no guarding or peritoneal signs.  Muscular  skeletal:  Limited exam -global generalized weaknesses - in bed, able to move all 4 extremities,   2+ pulses,  symmetric, No pitting edema  Skin:  Dry, warm to touch, negative for any Rashes,  Wounds: Please see nursing documentation           Condition at discharge: good  The results of significant diagnostics from this hospitalization (including imaging, microbiology, ancillary and laboratory) are listed below for reference.   Imaging Studies: ECHOCARDIOGRAM COMPLETE Result Date: 05/13/2024    ECHOCARDIOGRAM REPORT   Patient Name:   Hollie Slavens Date of Exam: 05/12/2024 Medical Rec #:  [id number]      Height:       63.0 in Accession #:    [id number]     Weight:       213.4 lb Date of Birth:  1956-09-19       BSA:  1.988 m Patient Age:    68 years       BP:           131/66 mmHg Patient Gender: F              HR:           83 bpm. Exam Location:  [facility] Procedure: 2D Echo, Cardiac Doppler, Color Doppler and Intracardiac            Opacification Agent (Both Spectral and Color Flow Doppler were            utilized during procedure). Indications:    Pulmonary Embolus l26.09  History:        Patient has prior history of Echocardiogram examinations, most                 recent 01/13/2023. Risk Factors:Hypertension, Dyslipidemia and                 Former Smoker.  Sonographer:    Denese Finn RCS Referring Phys: [id number] DAWOOD S Osborne Blazer  Sonographer Comments: Image acquisition challenging due to patient body habitus and Image acquisition challenging due to respiratory motion. IMPRESSIONS  1. Left ventricular ejection fraction, by estimation, is 50 to 55%. The left ventricle has low normal function. The left ventricle demonstrates global hypokinesis. There is mild left ventricular hypertrophy. Left ventricular diastolic parameters are consistent with Grade I  diastolic dysfunction (impaired relaxation).  2. Right ventricular systolic function is normal. The right ventricular size is normal.  3. The mitral valve is normal in structure. No evidence of mitral valve regurgitation. No evidence of mitral stenosis.  4. The aortic valve is tricuspid. There is mild calcification of the aortic valve. There is mild thickening of the aortic valve. Aortic valve regurgitation is not visualized. Aortic valve sclerosis is present, with no evidence of aortic valve stenosis.  5. The inferior vena cava is normal in size with greater than 50% respiratory variability, suggesting right atrial pressure of 3 mmHg. FINDINGS  Left Ventricle: Left ventricular ejection fraction, by estimation, is 50 to 55%. The left ventricle has low normal function. The left ventricle demonstrates global hypokinesis. Definity contrast agent was given IV to delineate the left ventricular endocardial borders. Strain was performed and the global longitudinal strain is indeterminate. The left ventricular internal cavity size was normal in size. There is mild left ventricular hypertrophy. Left ventricular diastolic parameters are consistent with Grade I diastolic dysfunction (impaired relaxation). Right Ventricle: The right ventricular size is normal. No increase in right ventricular wall thickness. Right ventricular systolic function is normal. Left Atrium: Left atrial size was normal in size. Right Atrium: Right atrial size was normal in size. Pericardium: There is no evidence of pericardial effusion. Mitral Valve: The mitral valve is normal in structure. No evidence of mitral valve regurgitation. No evidence of mitral valve stenosis. Tricuspid Valve: The tricuspid valve is normal in structure. Tricuspid valve regurgitation is not demonstrated. No evidence of tricuspid stenosis. Aortic Valve: The aortic valve is tricuspid. There is mild calcification of the aortic valve. There is mild thickening of the aortic valve.  Aortic valve regurgitation is not visualized. Aortic valve sclerosis is present, with no evidence of aortic valve stenosis. Pulmonic Valve: The pulmonic valve was normal in structure. Pulmonic valve regurgitation is not visualized. No evidence of pulmonic stenosis. Aorta: The aortic root is normal in size and structure. Venous: The inferior vena cava is normal in size with greater than 50% respiratory variability,  suggesting right atrial pressure of 3 mmHg. IAS/Shunts: No atrial level shunt detected by color flow Doppler. Additional Comments: 3D was performed not requiring image post processing on an independent workstation and was indeterminate.  LEFT VENTRICLE PLAX 2D LVIDd:         5.30 cm   Diastology LVIDs:         3.60 cm   LV e' medial:    5.98 cm/s LV PW:         1.10 cm   LV E/e' medial:  8.1 LV IVS:        1.20 cm   LV e' lateral:   8.27 cm/s LVOT diam:     2.10 cm   LV E/e' lateral: 5.8 LV SV:         54 LV SV Index:   27 LVOT Area:     3.46 cm  RIGHT VENTRICLE RV S prime:     12.70 cm/s TAPSE (M-mode): 1.9 cm LEFT ATRIUM             Index        RIGHT ATRIUM           Index LA diam:        3.10 cm 1.56 cm/m   RA Area:     19.20 cm LA Vol (A2C):   47.5 ml 23.90 ml/m  RA Volume:   55.10 ml  27.72 ml/m LA Vol (A4C):   35.7 ml 17.96 ml/m LA Biplane Vol: 42.2 ml 21.23 ml/m  AORTIC VALVE LVOT Vmax:   90.00 cm/s LVOT Vmean:  59.600 cm/s LVOT VTI:    0.156 m  AORTA Ao Root diam: 3.70 cm MITRAL VALVE MV Area (PHT): 3.60 cm    SHUNTS MV Decel Time: 211 msec    Systemic VTI:  0.16 m MV E velocity: 48.30 cm/s  Systemic Diam: 2.10 cm MV A velocity: 76.70 cm/s MV E/A ratio:  0.63 Janelle Mediate MD Electronically signed by Janelle Mediate MD Signature Date/Time: 05/13/2024/11:13:16 AM    Final    US  Venous Img Lower Bilateral (DVT) Result Date: 05/11/2024 CLINICAL DATA:  Pulmonary embolus. EXAM: BILATERAL LOWER EXTREMITY VENOUS DOPPLER ULTRASOUND TECHNIQUE: Gray-scale sonography with graded compression, as well as  color Doppler and duplex ultrasound were performed to evaluate the lower extremity deep venous systems from the level of the common femoral vein and including the common femoral, femoral, profunda femoral, popliteal and calf veins including the posterior tibial, peroneal and gastrocnemius veins when visible. The superficial great saphenous vein was also interrogated. Spectral Doppler was utilized to evaluate flow at rest and with distal augmentation maneuvers in the common femoral, femoral and popliteal veins. COMPARISON:  None Available. FINDINGS: RIGHT LOWER EXTREMITY Common Femoral Vein: No evidence of thrombus. Normal compressibility, respiratory phasicity and response to augmentation. Saphenofemoral Junction: No evidence of thrombus. Normal compressibility and flow on color Doppler imaging. Profunda Femoral Vein: No evidence of thrombus. Normal compressibility and flow on color Doppler imaging. Femoral Vein: No evidence of thrombus. Normal compressibility, respiratory phasicity and response to augmentation. Popliteal Vein: No evidence of thrombus. Normal compressibility, respiratory phasicity and response to augmentation. Calf Veins: No evidence of thrombus. Normal compressibility and flow on color Doppler imaging. Superficial Great Saphenous Vein: No evidence of thrombus. Normal compressibility. Venous Reflux:  None. Other Findings:  None. LEFT LOWER EXTREMITY Common Femoral Vein: No evidence of thrombus. Normal compressibility, respiratory phasicity and response to augmentation. Saphenofemoral Junction: No evidence of thrombus. Normal compressibility and flow on  color Doppler imaging. Profunda Femoral Vein: No evidence of thrombus. Normal compressibility and flow on color Doppler imaging. Femoral Vein: No evidence of thrombus. Normal compressibility, respiratory phasicity and response to augmentation. Popliteal Vein: Small amount of adherent/peripheral clot in the popliteal vein is consistent with sequela of  chronic/remote thrombus. Calf Veins: No evidence of thrombus. Normal compressibility and flow on color Doppler imaging. Superficial Great Saphenous Vein: No evidence of thrombus. Normal compressibility. Venous Reflux:  None. Other Findings:  None. IMPRESSION: 1. Small amount of clot in the left popliteal vein has the appearance of chronic thrombus. 2. No additional or evidence of acute lower extremity DVT. Electronically Signed   By: Chadwick Colonel M.D.   On: 05/11/2024 18:21   CT Angio Chest PE W/Cm &/Or Wo Cm Result Date: 05/11/2024 CLINICAL DATA:  Syncope.  Concern for pulmonary embolism. EXAM: CT ANGIOGRAPHY CHEST WITH CONTRAST TECHNIQUE: Multidetector CT imaging of the chest was performed using the standard protocol during bolus administration of intravenous contrast. Multiplanar CT image reconstructions and MIPs were obtained to evaluate the vascular anatomy. RADIATION DOSE REDUCTION: This exam was performed according to the departmental dose-optimization program which includes automated exposure control, adjustment of the mA and/or kV according to patient size and/or use of iterative reconstruction technique. CONTRAST:  75mL OMNIPAQUE  IOHEXOL  350 MG/ML SOLN COMPARISON:  Cardiac CT dated 05/09/2024. FINDINGS: Cardiovascular: Borderline cardiomegaly. No pericardial effusion. Mild atherosclerotic calcification of the thoracic aorta. No aneurysmal dilatation or dissection. The origins of the great vessels of the aortic arch appear patent. Moderate amount of thrombus in the distal right main pulmonary artery extending to the lobar and segmental branches of the right upper and right lower lobes. Mildly dilated right ventricle with RV/LV ratio in the range of 0.7-1.0. Correlation with EKG and troponin levels recommended to evaluate for right heart straining. Mediastinum/Nodes: No hilar or mediastinal adenopathy. The esophagus is grossly unremarkable. No mediastinal fluid collection. Lungs/Pleura: Bibasilar  subpleural reticulation and honeycombing consistent with fibrosis. Small left upper lobe calcified granuloma. There is a 4 mm right upper lobe nodule. No focal consolidation, pleural effusion or pneumothorax. The central airways are patent. Upper Abdomen: No acute abnormality. Musculoskeletal: Osteopenia with degenerative changes. No acute osseous pathology. Review of the MIP images confirms the above findings. IMPRESSION: 1. Right pulmonary artery embolus with borderline right heart strain. Correlation with EKG and troponin levels recommended. 2. Bibasilar fibrosis. 3.  Aortic Atherosclerosis (ICD10-I70.0). These results were called by telephone at the time of interpretation on 05/11/2024 at 4:48 pm to provider CHRISTOPHER RIGNEY , who verbally acknowledged these results. Electronically Signed   By: Angus Bark M.D.   On: 05/11/2024 16:58   CT CORONARY MORPH W/CTA COR W/SCORE W/CA W/CM &/OR WO/CM Addendum Date: 05/11/2024 ADDENDUM REPORT: 05/11/2024 11:13 ADDENDUM: Critical Value/emergent results were called by telephone at the time of interpretation on 05/11/2024 at 11:12 am to provider Dr. Armida Lander, who verbally acknowledged these results. Electronically Signed   By: Fredrich Jefferson M.D.   On: 05/11/2024 11:13   Addendum Date: 05/11/2024 ADDENDUM REPORT: 05/11/2024 10:34 ADDENDUM: OVER-READ INTERPRETATION  CT CHEST The following report is an over-read performed by radiologist Dr. Avanell [facility] on 02/24/2024. This over-read does not include interpretation of cardiac or coronary anatomy or pathology. The interpretation by the cardiologist is attached. COMPARISON:  None. FINDINGS: Within the visualized portions of the lungs demonstrates no acute infiltrates consolidations. Bilateral basilar bronchiectasis. Prominence of the interstitial markings of the bases of the lungs correlate with  chronic interstitial lung disease and COPD changes. Although the study is not tailored for  opacification of the pulmonary arteries there is a suggestion of an intraluminal filling defects within the right distal main pulmonary artery extending into the interlobar region of the right main pulmonary artery. Findings are highly suspicious for pulmonary embolism. Refer to series 401 images 60 through 75, these filling defects appears attached to the anterolateral wall of the pulmonary artery and may indicate a chronic pulmonary embolus. No pulmonary nodules or masses. No pleural effusions. No mediastinal masses or adenopathy. No pericardial effusions. Aorta appears normal caliber. No acute findings in the upper abdomen. Chest wall and soft tissues are unremarkable without evidence of acute bony abnormalities. Coronary artery calcifications IMPRESSION: *Findings highly suspicious for pulmonary embolism in the right distal main pulmonary artery extending into the interlobar region of the right main pulmonary artery. *Chronic interstitial lung disease and COPD changes. *Coronary artery calcifications. Electronically Signed   By: Fredrich Jefferson M.D.   On: 05/11/2024 10:34   Result Date: 05/11/2024 CLINICAL DATA:  Chest pain EXAM: Cardiac CTA MEDICATIONS: Sub lingual nitro. 4mg  x 2 TECHNIQUE: A non-contrast, gated CT scan was obtained with axial slices of 2.5 mm through the heart for calcium  scoring. Calcium  scoring was performed using the Agatston method. A 120 kV prospective, gated, contrast cardiac CT scan was obtained. Gantry rotation speed was 230 msec and collimation was 0.63 mm. Two sublingual nitroglycerin  tablets (0.8 mg) were given. The 3D data set was reconstructed with motion correction for the best systolic or diastolic phase. Images were analyzed on a dedicated workstation using MPR, MIP, and VRT modes. The patient received 95 cc contrast. FINDINGS: Non-cardiac: See separate report from [facility]. Normal caliber thoracic aorta. Pulmonary veins drain normally to the left atrium. No LA  appendage thrombus. Calcium  Score: 82.6 Agatson units. Coronary Arteries: Right dominant with no anomalies LM: No plaque or stenosis. LAD system: No plaque or stenosis. Circumflex system: Mixed plaque proximal LCx with mild (1-24%) stenosis. RCA system: Calcified plaque proximal RCA, mild (1-24%) stenosis. IMPRESSION: 1. Coronary artery calcium  score 82.6 Agatston units. This places the patient in the 77th percentile for age and gender, suggesting intermediate risk for future cardiac events. 2.  Mild nonobstructive coronary disease. [employer] account executive Electronically Signed: By: Peder Bourdon M.D. On: 05/10/2024 16:08    Microbiology: Results for orders placed or performed during the hospital encounter of 07/02/22  Surgical pcr screen     Status: None   Collection Time: 07/02/22  2:20 PM   Specimen: Nasal Mucosa; Nasal Swab  Result Value Ref Range Status   MRSA, PCR NEGATIVE NEGATIVE Final   Staphylococcus aureus NEGATIVE NEGATIVE Final    Comment: (NOTE) The Xpert SA Assay (FDA approved for NASAL specimens in patients 2 years of age and older), is one component of a comprehensive surveillance program. It is not intended to diagnose infection nor to guide or monitor treatment. Performed at [facility], [address], [place], [place] [address]     Labs: CBC: Recent Labs  Lab 05/11/24 1432 05/12/24 0231  WBC 5.4 3.9*  NEUTROABS 3.6  --   HGB 10.8* 9.7*  HCT 33.0* 30.8*  MCV 85.5 84.8  PLT 264 236   Basic Metabolic Panel: Recent Labs  Lab 05/11/24 1546 05/12/24 0231  NA 138 137  K 3.5 3.4*  CL 102 103  CO2 26 29  GLUCOSE 85 92  BUN 8 11  CREATININE 0.87 0.88  CALCIUM   8.9 8.6*   Liver Function Tests: Recent Labs  Lab 05/11/24 1546  AST 32  ALT 21  ALKPHOS 60  BILITOT 0.5  PROT 7.2  ALBUMIN 2.8*   CBG: No results for input(s): "GLUCAP" in the last 168 hours.  Discharge time spent: greater than 30 minutes.  Signed: Bobbetta Burnet, MD [facility] 05/13/2024

## 2024-05-13 NOTE — Plan of Care (Signed)
  Problem: Education: Goal: Knowledge of General Education information will improve Description: Including pain rating scale, medication(s)/side effects and non-pharmacologic comfort measures 05/13/2024 1136 by Goldie Later, RN Outcome: Adequate for Discharge 05/13/2024 0728 by Goldie Later, RN Outcome: Progressing   Problem: Health Behavior/Discharge Planning: Goal: Ability to manage health-related needs will improve 05/13/2024 1136 by Goldie Later, RN Outcome: Adequate for Discharge 05/13/2024 0728 by Goldie Later, RN Outcome: Progressing   Problem: Clinical Measurements: Goal: Ability to maintain clinical measurements within normal limits will improve 05/13/2024 1136 by Goldie Later, RN Outcome: Adequate for Discharge 05/13/2024 0728 by Goldie Later, RN Outcome: Progressing Goal: Will remain free from infection 05/13/2024 1136 by Goldie Later, RN Outcome: Adequate for Discharge 05/13/2024 0728 by Goldie Later, RN Outcome: Progressing Goal: Diagnostic test results will improve 05/13/2024 1136 by Goldie Later, RN Outcome: Adequate for Discharge 05/13/2024 0728 by Goldie Later, RN Outcome: Progressing Goal: Respiratory complications will improve 05/13/2024 1136 by Goldie Later, RN Outcome: Adequate for Discharge 05/13/2024 0728 by Goldie Later, RN Outcome: Progressing Goal: Cardiovascular complication will be avoided 05/13/2024 1136 by Goldie Later, RN Outcome: Adequate for Discharge 05/13/2024 0728 by Goldie Later, RN Outcome: Progressing   Problem: Activity: Goal: Risk for activity intolerance will decrease 05/13/2024 1136 by Goldie Later, RN Outcome: Adequate for Discharge 05/13/2024 0728 by Goldie Later, RN Outcome: Progressing   Problem: Nutrition: Goal: Adequate nutrition will be maintained 05/13/2024 1136 by Goldie Later, RN Outcome: Adequate for Discharge 05/13/2024 0728 by Goldie Later, RN Outcome: Progressing   Problem: Coping: Goal: Level of anxiety will  decrease 05/13/2024 1136 by Goldie Later, RN Outcome: Adequate for Discharge 05/13/2024 0728 by Goldie Later, RN Outcome: Progressing   Problem: Elimination: Goal: Will not experience complications related to bowel motility 05/13/2024 1136 by Goldie Later, RN Outcome: Adequate for Discharge 05/13/2024 0728 by Goldie Later, RN Outcome: Progressing Goal: Will not experience complications related to urinary retention 05/13/2024 1136 by Goldie Later, RN Outcome: Adequate for Discharge 05/13/2024 0728 by Goldie Later, RN Outcome: Progressing   Problem: Pain Managment: Goal: General experience of comfort will improve and/or be controlled 05/13/2024 1136 by Goldie Later, RN Outcome: Adequate for Discharge 05/13/2024 0728 by Goldie Later, RN Outcome: Progressing   Problem: Safety: Goal: Ability to remain free from injury will improve 05/13/2024 1136 by Goldie Later, RN Outcome: Adequate for Discharge 05/13/2024 0728 by Goldie Later, RN Outcome: Progressing   Problem: Skin Integrity: Goal: Risk for impaired skin integrity will decrease 05/13/2024 1136 by Goldie Later, RN Outcome: Adequate for Discharge 05/13/2024 0728 by Goldie Later, RN Outcome: Progressing

## 2024-05-13 NOTE — Plan of Care (Signed)

## 2024-05-14 ENCOUNTER — Telehealth: Payer: Self-pay

## 2024-05-14 DIAGNOSIS — Z5941 Food insecurity: Secondary | ICD-10-CM

## 2024-05-14 NOTE — Transitions of Care (Post Inpatient/ED Visit) (Signed)
 05/14/2024  Name: Jean Howell MRN: [id number] DOB: 08/27/1956  Today's TOC FU Call Status: Today's TOC FU Call Status:: Successful TOC FU Call Completed TOC FU Call Complete Date: 05/14/24 Patient's Name and Date of Birth confirmed.  Transition Care Management Follow-up Telephone Call Date of Discharge: 05/13/24 Dis[facility] Type of Discharge: Inpatient Admission Primary Inpatient Discharge Diagnosis:: Pulmonary emboli How have you been since you were released from the hospital?: Better Any questions or concerns?: No  Items Reviewed: Did you receive and understand the discharge instructions provided?: Yes Medications obtained,verified, and reconciled?: Yes (Medications Reviewed) Any new allergies since your discharge?: No Dietary orders reviewed?: NA Do you have support at home?: Yes People in Home [RPT]: alone  Medications Reviewed Today: Medications Reviewed Today     Reviewed by Chauncy Coral, CMA (Certified Medical Assistant) on 05/14/24 at 1407  Med List Status: <None>   Medication Order Taking? Sig Documenting Provider Last Dose Status Informant  Abatacept  (ORENCIA  CLICKJECT) 125 MG/ML SOAJ [id number]  Inject 125 mg into the skin once a week. Nicholas Bari, MD  Active Self, Pharmacy Records  acetaminophen  (TYLENOL ) 650 MG CR tablet [id number]  Take 1,300 mg by mouth every 8 (eight) hours as needed for pain. [provider]  Active Self, Pharmacy Records  apixaban (ELIQUIS) 5 MG TABS tablet [id number]  Take 2 tablets (10 mg total) by mouth 2 (two) times daily for 7 days, THEN 1 tablet (5 mg total) 2 (two) times daily. Shahmehdi, Seyed A, MD  Active   betamethasone dipropionate (DIPROLENE) 0.05 % ointment [id number]  Apply topically as needed. [provider]  Active Self, Pharmacy Records  Calcium -Magnesium-Zinc (CAL-MAG-ZINC PO) [id number]  Take 1 tablet by mouth daily. [provider]  Active Self, Pharmacy Records   cyanocobalamin  (VITAMIN B12) 1000 MCG tablet [id number]  Take 1 tablet by mouth daily. [provider]  Active Self, Pharmacy Records  Ergocalciferol  (VITAMIN D2) 10 MCG (400 UNIT) TABS [id number]  Take 1 tablet by mouth daily. [provider]  Active Self, Pharmacy Records  fluticasone  (FLONASE ) 50 MCG/ACT nasal spray [id number]  USE 2 SPRAYS IN EACH NOSTRIL DAILY  Patient taking differently: Place 2 sprays into both nostrils as needed.   Eliodoro Guerin, DO  Active Self, Pharmacy Records  gabapentin  (NEURONTIN ) 600 MG tablet [id number]  Take 1 tablet (600 mg total) by mouth at bedtime. **dose change Vicky Grange M, DO  Active Self, Pharmacy Records  leflunomide  (ARAVA ) 20 MG tablet [id number]  TAKE ONE (1) TABLET BY MOUTH EVERY DAY Nicholas Bari, MD  Active Self, Pharmacy Records  losartan -hydrochlorothiazide  [facility]) 100-12.5 MG tablet [id number] No TAKE ONE (1) TABLET BY MOUTH EVERY DAY  Patient not taking: Reported on 05/14/2024   Eliodoro Guerin, DO Not Taking Active Self, Pharmacy Records  Multiple Vitamin (MULTIVITAMIN PO) [id number]  Take 1 tablet by mouth daily. [provider]  Active Self, Pharmacy Records  rosuvastatin  (CRESTOR ) 10 MG tablet [id number]  Take 1 tablet (10 mg total) by mouth daily. Eliodoro Guerin, DO  Active Self, Pharmacy Records  Suvorexant  (BELSOMRA ) 10 MG TABS [id number]  Take 10 mg by mouth at bedtime as needed (sleep). Place on hold Eliodoro Guerin, DO  Active Self, Pharmacy Records  UNABLE TO Rushie Courier [id number]  Med Name: CBD roll on, as needed [provider]  Active Self, Pharmacy Records  VITAMIN E  PO [id number]  Take by mouth daily. [provider]  Active Self, Pharmacy Records  Home Care and Equipment/Supplies: Were Home Health Services Ordered?: NA Any new equipment or medical supplies ordered?: NA  Functional Questionnaire: Do you need assistance with bathing/showering or  dressing?: No Do you need assistance with meal preparation?: Yes Do you need assistance with eating?: No Do you have difficulty maintaining continence: No Do you need assistance with getting out of bed/getting out of a chair/moving?: No Do you have difficulty managing or taking your medications?: No  Follow up appointments reviewed: PCP Follow-up appointment confirmed?: Yes Date of PCP follow-up appointment?: 05/21/24 Follow-up Provider: Eliodoro Guerin, DO Specialist Hospital Follow-up appointment confirmed?: NA Do you need transportation to your follow-up appointment?: No Do you understand care options if your condition(s) worsen?: Yes-patient verbalized understanding    SIGNATURE Germain Kohler, CMA (AAMA)  [facility] [phone]

## 2024-05-16 ENCOUNTER — Telehealth: Payer: Self-pay

## 2024-05-16 NOTE — Progress Notes (Signed)
 Complex Care Management Note Care Guide Note  05/16/2024 Name: Jean Howell MRN: [id number] DOB: 1956-02-27  Jean Howell is a 68 y.o. year old female who is a primary care patient of Eliodoro Guerin, DO . The community resource team was consulted for assistance with Food Insecurity  SDOH screenings and interventions completed:  Yes  Social Drivers of Health From This Encounter   Food Insecurity: No Food Insecurity (05/16/2024)   Hunger Vital Sign    Worried About Running Out of Food in the Last Year: Never true    Ran Out of Food in the Last Year: Never true  Housing: Low Risk  (05/16/2024)   Housing Stability Vital Sign    Unable to Pay for Housing in the Last Year: No    Number of Times Moved in the Last Year: 0    Homeless in the Last Year: No  Financial Resource Strain: Medium Risk (05/16/2024)   Overall Financial Resource Strain (CARDIA)    Difficulty of Paying Living Expenses: Somewhat hard  Transportation Needs: No Transportation Needs (05/16/2024)   PRAPARE - Administrator, Civil Service (Medical): No    Lack of Transportation (Non-Medical): No  Utilities: Not At Risk (05/16/2024)   Utilities    Threatened with loss of utilities: No    SDOH Interventions Today    Flowsheet Row Most Recent Value  SDOH Interventions   Food Insecurity Interventions Other (Comment)  [Patient is on wait list for [employer] and receives $23 monthly in SNAP benefits. 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  Follow Up Plan:  No further follow up planned at this time. The patient has been provided with needed resources.  Encounter Outcome:  Patient Visit Completed  [facility]  [facility] Guide Direct Dial: [phone]  Fax: [phone] Website:  [email or web address]

## 2024-05-21 ENCOUNTER — Ambulatory Visit: Admitting: Family Medicine

## 2024-05-21 ENCOUNTER — Encounter: Payer: Self-pay | Admitting: Family Medicine

## 2024-05-21 VITALS — BP 112/72 | HR 95 | Temp 98.3°F | Ht 63.0 in | Wt 215.0 lb

## 2024-05-21 DIAGNOSIS — I2699 Other pulmonary embolism without acute cor pulmonale: Secondary | ICD-10-CM

## 2024-05-21 DIAGNOSIS — Z09 Encounter for follow-up examination after completed treatment for conditions other than malignant neoplasm: Secondary | ICD-10-CM | POA: Diagnosis not present

## 2024-05-21 DIAGNOSIS — D649 Anemia, unspecified: Secondary | ICD-10-CM | POA: Diagnosis not present

## 2024-05-21 NOTE — Patient Instructions (Signed)
 Blood Clot in the Lung (Pulmonary Embolism): What to Know  A pulmonary embolism (PE) is when you get a sudden blockage or less blood flow in one or both of your lungs. This typically happens when a blood clot moves into the arteries of your lung. A blood clot is blood that has turned into a gel or solid. Most blockages come from deep vein thrombosis (DVT). This is when a blood clot forms in the vein of a leg or arm and moves to your lungs. PE is dangerous. It must be treated right away. What are the causes? In most cases, PE is caused by a blood clot that forms in a vein and moves to your lungs.  In rare cases, it may be caused by: Air. Fat. Part of a tumor. A tumor is a growth of cells or tissue that isn't normal. Other tissue that moves through your veins and into your lungs. What increases the risk? You may be more likely to get PE if: You get really hurt, such as from: Breaking a hip or leg. You're also more at risk if your leg is in a cast or splint. An injury to your spinal cord. You have a big surgery, such as: A hip or knee replacement. Surgery on parts of your nervous system. Surgery on your belly. You have certain conditions, such as: A stroke. A problem with how your blood clots. A long-term lung or heart disease. You have a soft tube called a central venous catheter. You're being treated for cancer. You take medicines that have estrogen in them. You smoke. You spend a lot of time sitting, such as if you travel over 6 hours. You may also be more likely to get PE if you're: Pregnant or just had a baby. Older than 70 years of age. Overweight. Not very active. Not able to move at all. What are the signs or symptoms? Symptoms may start all of a sudden. They may include: Feeling short of breath while moving or resting. Coughing, coughing up blood, or coughing up bloody mucus. Pain that gets worse with deep breaths in your: Chest. Back. Shoulder blade. A heartbeat  that's fast or not normal. Feeling light-headed or dizzy. Fainting. Feeling worried or nervous. Pain and swelling in a leg. This is a symptom of DVT. DVT can lead to PE. How is this diagnosed? PE may be diagnosed based on your medical history, an exam, and tests. Tests may include: Blood tests. An electrocardiogram (ECG). This checks how well your heart is working. A CT pulmonary angiogram. This checks blood flow in and around your lungs. A ventilation-perfusion scan. This measures air flow and blood flow to your lungs. An ultrasound to check for a DVT. How is this treated? Treatment may depend on what caused the PE, how likely you are to have bleeding, and other conditions you have. Treatment may be done to: Stop blood clots from forming. Stop the clot from getting bigger. Break apart the clot. Remove the clot. Treatment may include: Medicines to: Stop clots from forming and growing. You may be given blood thinners. Break apart clots. Procedures, such as: An embolectomy. This uses a tube to remove a clot. Catheter-directed thrombolysis. This uses medicine to get rid of a clot. Surgical embolectomy. This is a surgery to get rid of the clot. It's used in rare cases. You may need more than one treatment. Work with your provider to choose the best treatment program for you. Follow these instructions at home: Medicines  Take your medicines only as told. If you're taking blood thinners: Take your medicines as told. Take them at the same time each day. Talk with your provider before taking any products you can buy at the store. Do not do things that could hurt or bruise you. Be careful to avoid falls. Wear an alert bracelet or carry a card that says you take blood thinners. Know what foods and other medicines you can have with your medicines. General instructions Stay at a healthy weight. Ask your provider what weight is healthy for you. Do not smoke, vape, or use nicotine or  tobacco. Do not sit or lie down for a long time without moving. Tell your provider if you plan to travel. Make sure you can still take your medicine while you travel. Ask what things are safe for you to do at home. Ask when you can go back to work or school. Keep all follow-up visits. Your provider will closely watch how you're doing. Where to find more information To learn more, go to these websites: Centers for Disease Control and Prevention at [employer].no. Then: Click Health Topics A-Z. Type "blood clot" into the search box. American Lung Association (ALA): lung.org Contact a health care provider if: You miss a dose of your blood thinner. You have a fever. Get help right away if: You have: New or more pain, swelling, warmth, or redness in an arm or leg. Worse chest pain. A heartbeat that's fast or not normal. A very bad headache. Changes to your eyesight. A very bad fall or accident, or you hit your head. Blood in your pee or poop. A cut that won't stop bleeding. You feel short of breath, and it gets worse when you're moving or resting. You throw up or cough up blood. You feel light-headed or dizzy, and the feeling doesn't go away. You can't move your arms or legs. You're confused or have memory loss. These symptoms may be an emergency. Call 911 right away. Do not wait to see if the symptoms will go away. Do not drive yourself to the hospital. This information is not intended to replace advice given to you by your health care provider. Make sure you discuss any questions you have with your health care provider. Document Revised: 06/14/2023 Document Reviewed: 06/14/2023 Elsevier Patient Education  2024 [employer].

## 2024-05-21 NOTE — Progress Notes (Signed)
 Subjective: WU:JWJX discharge follow up for PE/ DVT PCP: Eliodoro Guerin, DO BJY:NWGNFAOZ Selley is a 68 y.o. female presenting to clinic today for:  1. PE/DVT Incidentally found to have features concerning for PE on cardiac imaging, was sent to ER.  She was found to have multiple PE on Right and borderline heart strain.  Left popliteal clot that appeared to be chronic.  She was briefly admitted.  She was treated initially with Lovenox  and transitioned over to Eliquis .  Clot was unprovoked.  She presents today for follow-up and notes she has ongoing dyspnea on exertion but this seems to be her baseline.  She was told apparently to hold her losartan -hydrochlorothiazide  and has done so.  Not monitoring blood pressures at home.  No chest pain, headache, edema   ROS: Per HPI  Allergies  Allergen Reactions   Enbrel  [Etanercept ] Rash    Psoriasis   Past Medical History:  Diagnosis Date   Arthritis    Depression    Hypertension    Rheumatoid arthritis (HCC)     Current Outpatient Medications:    Abatacept  (ORENCIA  CLICKJECT) 125 MG/ML SOAJ, Inject 125 mg into the skin once a week., Disp: 4 mL, Rfl: 2   acetaminophen  (TYLENOL ) 650 MG CR tablet, Take 1,300 mg by mouth every 8 (eight) hours as needed for pain., Disp: , Rfl:    apixaban  (ELIQUIS ) 5 MG TABS tablet, Take 2 tablets (10 mg total) by mouth 2 (two) times daily for 7 days, THEN 1 tablet (5 mg total) 2 (two) times daily., Disp: 60 tablet, Rfl: 3   betamethasone dipropionate (DIPROLENE) 0.05 % ointment, Apply topically as needed., Disp: , Rfl:    Calcium -Magnesium-Zinc (CAL-MAG-ZINC PO), Take 1 tablet by mouth daily., Disp: , Rfl:    cyanocobalamin  (VITAMIN B12) 1000 MCG tablet, Take 1 tablet by mouth daily., Disp: , Rfl:    Ergocalciferol  (VITAMIN D2) 10 MCG (400 UNIT) TABS, Take 1 tablet by mouth daily., Disp: , Rfl:    fluticasone  (FLONASE ) 50 MCG/ACT nasal spray, USE 2 SPRAYS IN EACH NOSTRIL DAILY (Patient taking  differently: Place 2 sprays into both nostrils as needed.), Disp: 16 g, Rfl: 6   gabapentin  (NEURONTIN ) 600 MG tablet, Take 1 tablet (600 mg total) by mouth at bedtime. **dose change, Disp: 90 tablet, Rfl: 3   leflunomide  (ARAVA ) 20 MG tablet, TAKE ONE (1) TABLET BY MOUTH EVERY DAY, Disp: 90 tablet, Rfl: 0   losartan -hydrochlorothiazide  (HYZAAR) 100-12.5 MG tablet, TAKE ONE (1) TABLET BY MOUTH EVERY DAY (Patient not taking: Reported on 05/14/2024), Disp: 90 tablet, Rfl: 3   Multiple Vitamin (MULTIVITAMIN PO), Take 1 tablet by mouth daily., Disp: , Rfl:    rosuvastatin  (CRESTOR ) 10 MG tablet, Take 1 tablet (10 mg total) by mouth daily., Disp: 90 tablet, Rfl: 3   Suvorexant  (BELSOMRA ) 10 MG TABS, Take 10 mg by mouth at bedtime as needed (sleep). Place on hold, Disp: 30 tablet, Rfl: 3   UNABLE TO FIND, Med Name: CBD roll on, as needed, Disp: , Rfl:    VITAMIN E  PO, Take by mouth daily., Disp: , Rfl:  Social History   Socioeconomic History   Marital status: Widowed    Spouse name: Not on file   Number of children: 1   Years of education: 12th   Highest education level: GED or equivalent  Occupational History   Occupation: retired  Tobacco Use   Smoking status: Former    Current packs/day: 0.00    Average packs/day: 1  pack/day for 15.0 years (15.0 ttl pk-yrs)    Types: Cigarettes    Start date: 28    Quit date: 2000    Years since quitting: 25.4    Passive exposure: Past   Smokeless tobacco: Never  Vaping Use   Vaping status: Never Used  Substance and Sexual Activity   Alcohol use: Not Currently   Drug use: Never   Sexual activity: Not Currently    Comment: widowed  Other Topics Concern   Not on file  Social History Narrative   Patient is a widow that resides independently in [place].  She has 1 son, who resides in [place].   Social Drivers of Health   Financial Resource Strain: Medium Risk (05/16/2024)   Overall Financial Resource Strain (CARDIA)    Difficulty of  Paying Living Expenses: Somewhat hard  Food Insecurity: No Food Insecurity (05/16/2024)   Hunger Vital Sign    Worried About Running Out of Food in the Last Year: Never true    Ran Out of Food in the Last Year: Never true  Transportation Needs: No Transportation Needs (05/16/2024)   PRAPARE - Administrator, Civil Service (Medical): No    Lack of Transportation (Non-Medical): No  Physical Activity: Inactive (09/20/2022)   Exercise Vital Sign    Days of Exercise per Week: 0 days    Minutes of Exercise per Session: 0 min  Stress: No Stress Concern Present (06/23/2023)   [employer] of Occupational Health - Occupational Stress Questionnaire    Feeling of Stress : Not at all  Social Connections: Unknown (05/11/2024)   Social Connection and Isolation Panel    Frequency of Communication with Friends and Family: Twice a week    Frequency of Social Gatherings with Friends and Family: Patient unable to answer    Attends Religious Services: Never    Database administrator or Organizations: Patient unable to answer    Attends Banker Meetings: Patient unable to answer    Marital Status: Patient unable to answer  Intimate Partner Violence: Patient Unable To Answer (05/11/2024)   Humiliation, Afraid, Rape, and Kick questionnaire    Fear of Current or Ex-Partner: Patient unable to answer    Emotionally Abused: Patient unable to answer    Physically Abused: Patient unable to answer    Sexually Abused: Patient unable to answer   Family History  Problem Relation Age of Onset   Thyroid  disease Mother    CVA Mother 43   Cancer Father    Colon cancer Father    Thyroid  disease Sister    Congestive Heart Failure Sister    Hypertension Sister    Diabetes Sister    Hypertension Sister    Diabetes Sister    Hypertension Sister    Hypertension Sister    Cancer Brother        kidney    Healthy Son    Thyroid  disease Niece    Breast cancer Neg Hx     Objective: Office  vital signs reviewed. BP 112/72   Pulse 95   Temp 98.3 F (36.8 C)   Ht 5' 3 (1.6 m)   Wt 215 lb (97.5 kg)   SpO2 98%   BMI 38.09 kg/m   Physical Examination:  General: Awake, alert, nontoxic female, No acute distress HEENT: Sclera white.  Moist mucous membranes Cardio: regular rate and rhythm, S1S2 heard, no murmurs appreciated Pulm: clear to auscultation bilaterally, no wheezes, rhonchi or rales; normal work  of breathing on room air MSK: Ambulating with use of rolling walker  Assessment/ Plan: 68 y.o. female   Other acute pulmonary embolism without acute cor pulmonale (HCC) - Plan: Ambulatory referral to Hematology / Oncology, Anemia panel  Hospital discharge follow-up  Anemia, unspecified type - Plan: Anemia panel  I reviewed her hospital discharge summary and recommendations.  Will check anemia panel given hemoglobin that was downtrending at time of discharge and ongoing anticoagulation.  I referred her to hematology as well for further exploration of unprovoked DVT.   No orders of the defined types were placed in this encounter.  No orders of the defined types were placed in this encounter.   Today's visit is for Transitional Care Management.  The patient was discharged from [facility] on 05/13/2024 with a primary diagnosis of pulmonary emboli.   Contact with the patient and/or caregiver, by a clinical staff member, was made on 05/14/2024 and was documented as a telephone encounter within the EMR.  Through chart review and discussion with the patient I have determined that management of their condition is of moderate complexity.    Eliodoro Guerin, DO [facility] [phone]

## 2024-05-22 ENCOUNTER — Ambulatory Visit: Payer: Self-pay | Admitting: Family Medicine

## 2024-05-22 ENCOUNTER — Telehealth: Payer: Self-pay

## 2024-05-22 LAB — ANEMIA PANEL
Ferritin: 272 ng/mL — ABNORMAL HIGH (ref 15–150)
Folate, Hemolysate: 472 ng/mL
Folate, RBC: 1297 ng/mL (ref >498)
Hematocrit: 36.4 % (ref 34.0–46.6)
Iron Saturation: 20 % (ref 15–55)
Iron: 48 ug/dL (ref 27–139)
Retic Ct Pct: 2.1 % (ref 0.6–2.6)
Total Iron Binding Capacity: 243 ug/dL — ABNORMAL LOW (ref 250–450)
UIBC: 195 ug/dL (ref 118–369)
Vitamin B-12: >2000 pg/mL — ABNORMAL HIGH (ref 232–1245)

## 2024-05-22 NOTE — Telephone Encounter (Signed)
 Not for now because BPs controlled without. Monitor BP closely and if goes above 150/90, needs to resume

## 2024-05-22 NOTE — Telephone Encounter (Signed)
 Copied from CRM [id number]. Topic: General - Other >> May 22, 2024 11:18 AM Jean Howell wrote: Reason for CRM: Patient called.. was seen yesterday and hasn't been on losartan -hydrochlorothiazide  (HYZAAR) 100-12.5 MG tablet been off a week now.. ( bp was 117/75 ) .Jean Howell Wants to know if she needs to get back on it?

## 2024-05-22 NOTE — Telephone Encounter (Signed)
Pt notified.    LS

## 2024-05-28 ENCOUNTER — Other Ambulatory Visit: Payer: Self-pay

## 2024-05-31 ENCOUNTER — Other Ambulatory Visit: Payer: Self-pay

## 2024-05-31 NOTE — Progress Notes (Signed)
 Specialty Pharmacy Refill Coordination Note  Jean Howell is a 68 y.o. female contacted today regarding refills of specialty medication(s) Abatacept  ([facility]  [employer])   Patient requested Delivery   Delivery date: 06/01/24   Verified address: [address]   [place] [phone]   Medication will be filled on 05/31/24.

## 2024-06-01 ENCOUNTER — Other Ambulatory Visit: Payer: Self-pay | Admitting: Family Medicine

## 2024-06-01 DIAGNOSIS — D649 Anemia, unspecified: Secondary | ICD-10-CM

## 2024-06-06 ENCOUNTER — Ambulatory Visit: Payer: Self-pay

## 2024-06-06 NOTE — Telephone Encounter (Signed)
 FYI Only or Action Required?: Action required by provider: clinical question for provider.  Patient was last seen in primary care on 05/21/2024 by Jolinda Norene HERO, DO. Called Nurse Triage reporting medication question. Symptoms began na. Interventions attempted: Other: na. Symptoms are: na.  Triage Disposition: Call PCP When Office is Open  Patient/caregiver understands and will follow disposition?: No, wishes to speak with PCP  Message from [facility] sent at 06/06/2024  9:58 AM EDT  Pt is wanting to know if she should take her BP meds or not, states her Bp was 124/81   Reason for Disposition  Caller wants to use a complementary or alternative medicine  Answer Assessment - Initial Assessment Questions 1. NAME of MEDICINE: What medicine(s) are you calling about?     Blood pressure medications, herbal supplements  2. QUESTION: What is your question? (e.g., double dose of medicine, side effect)     *Should I take my blood pressure medication today, current bp 124/81? Advised to take     * ok to take digestive support supplements Herbal Green Powder and Biome Support Plus? Advised to await answer by pcp.   3. SYMPTOMS: Do you have any symptoms? If Yes, ask: What symptoms are you having?  How bad are the symptoms (e.g., mild, moderate, severe)     Denies symptoms.  Protocols used: Medication Question Call-A-AH

## 2024-06-06 NOTE — Telephone Encounter (Signed)
 Left message

## 2024-06-11 ENCOUNTER — Telehealth: Payer: Self-pay | Admitting: *Deleted

## 2024-06-11 ENCOUNTER — Other Ambulatory Visit

## 2024-06-11 ENCOUNTER — Encounter: Payer: Self-pay | Admitting: *Deleted

## 2024-06-11 DIAGNOSIS — D649 Anemia, unspecified: Secondary | ICD-10-CM

## 2024-06-12 ENCOUNTER — Ambulatory Visit: Payer: Self-pay | Admitting: Family Medicine

## 2024-06-12 LAB — CBC
Hematocrit: 37.4 % (ref 34.0–46.6)
Hemoglobin: 11.6 g/dL (ref 11.1–15.9)
MCH: 27.2 pg (ref 26.6–33.0)
MCHC: 31 g/dL — ABNORMAL LOW (ref 31.5–35.7)
MCV: 88 fL (ref 79–97)
Platelets: 300 x10E3/uL (ref 150–450)
RBC: 4.27 x10E6/uL (ref 3.77–5.28)
RDW: 15.5 % — ABNORMAL HIGH (ref 11.7–15.4)
WBC: 4.6 x10E3/uL (ref 3.4–10.8)

## 2024-06-13 ENCOUNTER — Ambulatory Visit: Payer: Self-pay

## 2024-06-13 NOTE — Telephone Encounter (Signed)
 Duplicate telephone encounter

## 2024-06-13 NOTE — Telephone Encounter (Signed)
 I printed the ingredients lists and placed them on your desk.

## 2024-06-13 NOTE — Telephone Encounter (Signed)
 Patient wants to know if it is okay to take Crown Naturale Herbal Green Powder and Systems analyst Support Plus?

## 2024-06-13 NOTE — Telephone Encounter (Signed)
 FYI Only or Action Required?: Action required by provider: clinical question for provider.  Patient was last seen in primary care on 05/21/2024 by Jolinda Norene HERO, DO.  Called Nurse Triage reporting Medication question.  Symptoms began na.  Interventions attempted: Other: na.  Symptoms are: na.  Triage Disposition: Call PCP When Office is Open  Patient/caregiver understands and will follow disposition?: No, wishes to speak with PCP  3rd call to pcp regarding same question. Please follow up.   Copied from CRM [id number]. Topic: Clinical - Pink Word Triage >> Jun 06, 2024 10:00 AM Sasha H wrote: Reason for Triage: Pt had diet powder that she is wanting to know if it is safe for her to take >> Jun 13, 2024  1:39 PM Thliyah D wrote: Patient want's to know if she can take the Green powder with her medication. She called about this previously. >> Jun 06, 2024 10:01 AM Sasha H wrote: Reason for Disposition  Caller wants to use a complementary or alternative medicine  Protocols used: Medication Question Call-A-AH

## 2024-06-13 NOTE — Telephone Encounter (Signed)
 She would have to bring these bottles into the office so that I can see what the ingredients are as I am not familiar with these because they are not prescriptions

## 2024-06-13 NOTE — Telephone Encounter (Addendum)
 Lmtcb asking for more information on the supplement she is taking

## 2024-06-18 ENCOUNTER — Inpatient Hospital Stay: Attending: Oncology | Admitting: Oncology

## 2024-06-18 ENCOUNTER — Inpatient Hospital Stay

## 2024-06-18 VITALS — BP 138/83 | HR 108 | Temp 97.8°F | Resp 18 | Ht 63.0 in | Wt 208.5 lb

## 2024-06-18 DIAGNOSIS — R791 Abnormal coagulation profile: Secondary | ICD-10-CM | POA: Insufficient documentation

## 2024-06-18 DIAGNOSIS — I82402 Acute embolism and thrombosis of unspecified deep veins of left lower extremity: Secondary | ICD-10-CM

## 2024-06-18 DIAGNOSIS — Z87891 Personal history of nicotine dependence: Secondary | ICD-10-CM | POA: Diagnosis not present

## 2024-06-18 DIAGNOSIS — Z86718 Personal history of other venous thrombosis and embolism: Secondary | ICD-10-CM | POA: Insufficient documentation

## 2024-06-18 DIAGNOSIS — M170 Bilateral primary osteoarthritis of knee: Secondary | ICD-10-CM | POA: Insufficient documentation

## 2024-06-18 DIAGNOSIS — R778 Other specified abnormalities of plasma proteins: Secondary | ICD-10-CM | POA: Diagnosis not present

## 2024-06-18 DIAGNOSIS — Z0000 Encounter for general adult medical examination without abnormal findings: Secondary | ICD-10-CM

## 2024-06-18 DIAGNOSIS — D500 Iron deficiency anemia secondary to blood loss (chronic): Secondary | ICD-10-CM | POA: Diagnosis not present

## 2024-06-18 DIAGNOSIS — Z800 Family history of malignant neoplasm of digestive organs: Secondary | ICD-10-CM | POA: Diagnosis not present

## 2024-06-18 DIAGNOSIS — Z79899 Other long term (current) drug therapy: Secondary | ICD-10-CM | POA: Diagnosis not present

## 2024-06-18 DIAGNOSIS — Z86711 Personal history of pulmonary embolism: Secondary | ICD-10-CM | POA: Diagnosis not present

## 2024-06-18 DIAGNOSIS — I2699 Other pulmonary embolism without acute cor pulmonale: Secondary | ICD-10-CM

## 2024-06-18 DIAGNOSIS — Z7901 Long term (current) use of anticoagulants: Secondary | ICD-10-CM | POA: Insufficient documentation

## 2024-06-18 DIAGNOSIS — I10 Essential (primary) hypertension: Secondary | ICD-10-CM | POA: Insufficient documentation

## 2024-06-18 DIAGNOSIS — M069 Rheumatoid arthritis, unspecified: Secondary | ICD-10-CM | POA: Insufficient documentation

## 2024-06-18 DIAGNOSIS — Z7969 Long term (current) use of other immunomodulators and immunosuppressants: Secondary | ICD-10-CM | POA: Diagnosis not present

## 2024-06-18 DIAGNOSIS — R7989 Other specified abnormal findings of blood chemistry: Secondary | ICD-10-CM | POA: Insufficient documentation

## 2024-06-18 LAB — CMP (CANCER CENTER ONLY)
ALT: 9 U/L (ref 0–44)
AST: 22 U/L (ref 15–41)
Albumin: 3.6 g/dL (ref 3.5–5.0)
Alkaline Phosphatase: 67 U/L (ref 38–126)
Anion gap: 13 (ref 5–15)
BUN: 10 mg/dL (ref 8–23)
CO2: 23 mmol/L (ref 22–32)
Calcium: 9.5 mg/dL (ref 8.9–10.3)
Chloride: 102 mmol/L (ref 98–111)
Creatinine: 0.94 mg/dL (ref 0.44–1.00)
GFR, Estimated: >60 mL/min (ref >60)
Glucose, Bld: 90 mg/dL (ref 70–99)
Potassium: 3.5 mmol/L (ref 3.5–5.1)
Sodium: 138 mmol/L (ref 135–145)
Total Bilirubin: 0.5 mg/dL (ref 0.0–1.2)
Total Protein: 7.9 g/dL (ref 6.5–8.1)

## 2024-06-18 LAB — CBC WITH DIFFERENTIAL (CANCER CENTER ONLY)
Abs Immature Granulocytes: 0.01 K/uL (ref 0.00–0.07)
Basophils Absolute: 0 K/uL (ref 0.0–0.1)
Basophils Relative: 0 %
Eosinophils Absolute: 0.1 K/uL (ref 0.0–0.5)
Eosinophils Relative: 1 %
HCT: 36.2 % (ref 36.0–46.0)
Hemoglobin: 11.6 g/dL — ABNORMAL LOW (ref 12.0–15.0)
Immature Granulocytes: 0 %
Lymphocytes Relative: 38 %
Lymphs Abs: 1.8 K/uL (ref 0.7–4.0)
MCH: 27.5 pg (ref 26.0–34.0)
MCHC: 32 g/dL (ref 30.0–36.0)
MCV: 85.8 fL (ref 80.0–100.0)
Monocytes Absolute: 0.1 K/uL (ref 0.1–1.0)
Monocytes Relative: 2 %
Neutro Abs: 2.7 K/uL (ref 1.7–7.7)
Neutrophils Relative %: 59 %
Platelet Count: 245 K/uL (ref 150–400)
RBC: 4.22 MIL/uL (ref 3.87–5.11)
RDW: 16.8 % — ABNORMAL HIGH (ref 11.5–15.5)
WBC Count: 4.6 K/uL (ref 4.0–10.5)
nRBC: 0 % (ref 0.0–0.2)

## 2024-06-18 LAB — D-DIMER, QUANTITATIVE: D-Dimer, Quant: >20 ug{FEU}/mL — ABNORMAL HIGH (ref 0.00–0.50)

## 2024-06-18 NOTE — Progress Notes (Signed)
 "   [facility]  HEMATOLOGY CLINIC CONSULTATION NOTE   PATIENT NAME: Jean Howell   MR#: [id number] DOB: 04-25-1956  DATE OF SERVICE: 06/18/2024   REFERRING PROVIDER  Jolinda Norene HERO, DO   Patient Care Team: Jolinda Norene HERO, DO as PCP - General (Family Medicine) Mallipeddi, Diannah SQUIBB, MD as PCP - Cardiology (Cardiology) Shaaron Lamar HERO, MD as Consulting Physician (Gastroenterology) Dolphus Reiter, MD as Consulting Physician (Rheumatology) Shona Rush, MD (Dermatology) Ladora Ross Lacy Phebe, MD as Referring Physician (Optometry) Jerri Kay HERO, MD as Attending Physician (Orthopedic Surgery)   REASON FOR CONSULTATION/ CHIEF COMPLAINT:  DVT and PE diagnosed on 05/11/2024  ASSESSMENT & PLAN:  Jean Howell is a 68 y.o. lady with a past medical history of hypertension, depression, arthritis, obesity, was referred to our service for evaluation after she was diagnosed with DVT and pulmonary embolism on 05/11/2024.    Pulmonary emboli (HCC) Incidentally found to have features concerning for PE on cardiac imaging, and was sent to ER on 05/11/2024.  She was found to have moderate amount of thrombus in the distal right main pulmonary artery extending to the lobar and segmental branches of the right upper and right lower lobes and borderline heart strain.  Ultrasound of the lower extremities showed left popliteal clot that appeared to be chronic.  She was briefly admitted.  Echocardiogram on 05/12/2024 did not show evidence of right heart strain.  She was treated initially with Lovenox  and transitioned over to Eliquis .   Possible contributing factors include reduced mobility due to osteoarthritis. She does have a family history of blood clots in niece and nephew.  We will pursue thrombophilia workup with prothrombin gene mutation, factor V Leiden mutation, anticardiolipin antibody, beta-2  glycoprotein antibody, lupus anticoagulant.  Rest of the hypercoagulable workup will be  deferred as it can be falsely abnormal given recent thromboembolism.  D-dimer remains elevated at >20.00.  She was advised to continue Eliquis  5 mg twice daily.  Given the extent of pulmonary embolism, at least 6 months of anticoagulation would be recommended.  I will plan to see her approximately in 3 months for follow-up.  Will consider ultrasound of the lower extremity and CT angiogram of the chest, depending on symptoms on return visit.  Healthcare maintenance Colonoscopy last performed in July 2020. Due for repeat colonoscopy this year due to family history of colon cancer. Procedure to be delayed until off blood thinners. - Revisit colonoscopy scheduling towards the end of the year  Primary osteoarthritis of both knees Osteoarthritis contributing to severely reduced mobility, potentially increasing risk for DVT and PE. Uses a walker for mobility assistance.  DVT (deep venous thrombosis) (HCC) Management as mentioned above under pulmonary emboli   I reviewed lab results and outside records for this visit and discussed relevant results with the patient. Diagnosis, plan of care and treatment options were also discussed in detail with the patient. Opportunity provided to ask questions and answers provided to her apparent satisfaction. Provided instructions to call our clinic with any problems, questions or concerns prior to return visit. I recommended to continue follow-up with PCP and sub-specialists. She verbalized understanding and agreed with the plan. No barriers to learning was detected.  Maeby Vankleeck, MD   [facility] A DEPT OF [facility] [address] [place] [address] Dept: [phone] Dept Fax: [phone]   HISTORY OF PRESENT ILLNESS:  Discussed the use of AI scribe software for  clinical note transcription with the patient, who gave verbal consent to proceed.  History of Present  Illness Jean Howell is a 69 year old female who presents with blood clots in the leg and lung. She is accompanied by her sister, Toy. She was referred by her primary doctor for evaluation of blood clots.  Incidentally found to have features concerning for PE on cardiac imaging, and was sent to ER on 05/11/2024.  She was found to have moderate amount of thrombus in the distal right main pulmonary artery extending to the lobar and segmental branches of the right upper and right lower lobes and borderline heart strain.  Ultrasound of the lower extremities showed left popliteal clot that appeared to be chronic.  She was briefly admitted.  Echocardiogram on 05/12/2024 did not show evidence of right heart strain.  She was treated initially with Lovenox  and transitioned over to Eliquis .   She experienced episodes of syncope prior to the discovery of the clots. No recent surgeries or prolonged bed rest were reported prior to the clot discovery.  She has a history of arthritis, leading to decreased mobility over the past six years, and uses a walker at home due to arthritis-related mobility issues.  She is currently taking Eliquis  5 mg twice daily without missing any doses.  There is a family history of blood clots in her niece and nephew. Her father had colon cancer, and she underwent a colonoscopy in July 2020.  No recent surgeries or being on bed rest prior to the clots.   She denies fever, cough, diarrhea, or other infectious symptoms.  She denies epistaxis, bloody stool, melena, hematuria, bruising or other bleeding symptoms. She also denies unintentional weight loss, night sweats or other constitutional symptoms.  MEDICAL HISTORY Past Medical History:  Diagnosis Date   Arthritis    Depression    Hypertension    Rheumatoid arthritis (HCC)      SURGICAL HISTORY Past Surgical History:  Procedure Laterality Date   BREAST EXCISIONAL BIOPSY Bilateral    pt unsure when- benign   BREAST SURGERY      nodules removed   COLONOSCOPY N/A 06/25/2019   Procedure: COLONOSCOPY;  Surgeon: Harvey Margo CROME, MD;  Location: [facility];  Service: Endoscopy;  Laterality: N/A;  1:30   MOLE REMOVAL Right 01/26/2022   foot (patient was put to sleep for procedure)   POLYPECTOMY  06/25/2019   Procedure: POLYPECTOMY;  Surgeon: Harvey Margo CROME, MD;  Location: [facility];  Service: Endoscopy;;   TOTAL KNEE ARTHROPLASTY Right 07/05/2022   Procedure: RIGHT TOTAL KNEE ARTHROPLASTY;  Surgeon: Jerri Kay HERO, MD;  Location: [facility] OR;  Service: Orthopedics;  Laterality: Right;     SOCIAL HISTORY: She reports that she quit smoking about 25 years ago. Her smoking use included cigarettes. She started smoking about 40 years ago. She has a 15 pack-year smoking history. She has been exposed to tobacco smoke. She has never used smokeless tobacco. She reports that she does not currently use alcohol. She reports that she does not use drugs. Social History   Socioeconomic History   Marital status: Widowed    Spouse name: Not on file   Number of children: 1   Years of education: 12th   Highest education level: GED or equivalent  Occupational History   Occupation: retired  Tobacco Use   Smoking status: Former    Current packs/day: 0.00    Average packs/day: 1 pack/day for 15.0 years (15.0 ttl pk-yrs)  Types: Cigarettes    Start date: 56    Quit date: 2000    Years since quitting: 25.5    Passive exposure: Past   Smokeless tobacco: Never  Vaping Use   Vaping status: Never Used  Substance and Sexual Activity   Alcohol use: Not Currently   Drug use: Never   Sexual activity: Not Currently    Comment: widowed  Other Topics Concern   Not on file  Social History Narrative   Patient is a widow that resides independently in [place].  She has 1 son, who resides in [place].   Social Drivers of Health   Financial Resource Strain: Medium Risk (05/16/2024)   Overall Financial Resource Strain (CARDIA)     Difficulty of Paying Living Expenses: Somewhat hard  Food Insecurity: No Food Insecurity (06/18/2024)   Hunger Vital Sign    Worried About Running Out of Food in the Last Year: Never true    Ran Out of Food in the Last Year: Never true  Transportation Needs: No Transportation Needs (06/18/2024)   PRAPARE - Administrator, Civil Service (Medical): No    Lack of Transportation (Non-Medical): No  Physical Activity: Inactive (09/20/2022)   Exercise Vital Sign    Days of Exercise per Week: 0 days    Minutes of Exercise per Session: 0 min  Stress: No Stress Concern Present (06/23/2023)   [employer] of Occupational Health - Occupational Stress Questionnaire    Feeling of Stress : Not at all  Social Connections: Unknown (05/11/2024)   Social Connection and Isolation Panel    Frequency of Communication with Friends and Family: Twice a week    Frequency of Social Gatherings with Friends and Family: Patient unable to answer    Attends Religious Services: Never    Database Administrator or Organizations: Patient unable to answer    Attends Banker Meetings: Patient unable to answer    Marital Status: Patient unable to answer  Intimate Partner Violence: Not At Risk (06/18/2024)   Humiliation, Afraid, Rape, and Kick questionnaire    Fear of Current or Ex-Partner: No    Emotionally Abused: No    Physically Abused: No    Sexually Abused: No    FAMILY HISTORY: Her family history includes CVA (age of onset: 44) in her mother; Cancer in her brother and father; Colon cancer in her father; Congestive Heart Failure in her sister; Diabetes in her sister and sister; Healthy in her son; Hypertension in her sister, sister, sister, and sister; Thyroid  disease in her mother, niece, and sister.  CURRENT MEDICATIONS   Current Outpatient Medications  Medication Instructions   acetaminophen  (TYLENOL ) 1,300 mg, Every 8 hours PRN   apixaban  (ELIQUIS ) 5 MG TABS tablet Take 2 tablets  (10 mg total) by mouth 2 (two) times daily for 7 days, THEN 1 tablet (5 mg total) 2 (two) times daily.   Belsomra  10 mg, Oral, At bedtime PRN, Place on hold   betamethasone dipropionate (DIPROLENE) 0.05 % ointment As needed   Calcium -Magnesium-Zinc (CAL-MAG-ZINC PO) 1 tablet, Daily   cyanocobalamin  (VITAMIN B12) 1000 MCG tablet 1 tablet, Daily   Ergocalciferol  (VITAMIN D2) 10 MCG (400 UNIT) TABS 1 tablet, Daily   fluticasone  (FLONASE ) 50 MCG/ACT nasal spray 2 sprays, Each Nare, Daily   gabapentin  (NEURONTIN ) 600 mg, Oral, Daily at bedtime, **dose change   leflunomide  (ARAVA ) 20 MG tablet Oral, Daily   losartan -hydrochlorothiazide  (HYZAAR) 100-12.5 MG tablet TAKE ONE (1) TABLET BY MOUTH  EVERY DAY   Multiple Vitamin (MULTIVITAMIN PO) 1 tablet, Daily   Orencia  ClickJect 125 mg, Subcutaneous, Weekly   rosuvastatin  (CRESTOR ) 10 mg, Oral, Daily   UNABLE TO FIND Med Name: CBD roll on, as needed   VITAMIN E  PO Daily     ALLERGIES  She is allergic to enbrel  [etanercept ].  REVIEW OF SYSTEMS:  Review of Systems - Oncology   Rest of the pertinent review of systems is unremarkable except as mentioned above in HPI.  PHYSICAL EXAMINATION:    Onc Performance Status - 06/18/24 1422       ECOG Perf Status   ECOG Perf Status Ambulatory and capable of all selfcare but unable to carry out any work activities.  Up and about more than 50% of waking hours      KPS SCALE   KPS % SCORE Requires occasional assistance but is able to care for most needs          Vitals:   06/18/24 1412  BP: 138/83  Pulse: (!) 108  Resp: 18  Temp: 97.8 F (36.6 C)  SpO2: 100%   Filed Weights   06/18/24 1412  Weight: 208 lb 8 oz (94.6 kg)    Physical Exam Constitutional:      General: She is not in acute distress.    Comments: Ambulating with assistance of a walker  HENT:     Head: Normocephalic and atraumatic.  Cardiovascular:     Rate and Rhythm: Normal rate.  Pulmonary:     Effort: Pulmonary  effort is normal. No respiratory distress.  Abdominal:     General: There is no distension.  Neurological:     General: No focal deficit present.     Mental Status: She is alert and oriented to person, place, and time.  Psychiatric:        Mood and Affect: Mood normal.        Behavior: Behavior normal.       LABORATORY DATA:   I have reviewed the data as listed.  Results for orders placed or performed in visit on 06/18/24  D-dimer, quantitative  Result Value Ref Range   D-Dimer, Quant >20.00 (H) 0.00 - 0.50 ug/mL-FEU  CMP (Cancer Center only)  Result Value Ref Range   Sodium 138 135 - 145 mmol/L   Potassium 3.5 3.5 - 5.1 mmol/L   Chloride 102 98 - 111 mmol/L   CO2 23 22 - 32 mmol/L   Glucose, Bld 90 70 - 99 mg/dL   BUN 10 8 - 23 mg/dL   Creatinine 9.05 9.55 - 1.00 mg/dL   Calcium  9.5 8.9 - 10.3 mg/dL   Total Protein 7.9 6.5 - 8.1 g/dL   Albumin 3.6 3.5 - 5.0 g/dL   AST 22 15 - 41 U/L   ALT 9 0 - 44 U/L   Alkaline Phosphatase 67 38 - 126 U/L   Total Bilirubin 0.5 0.0 - 1.2 mg/dL   GFR, Estimated >39 >39 mL/min   Anion gap 13 5 - 15  CBC with Differential (Cancer Center Only)  Result Value Ref Range   WBC Count 4.6 4.0 - 10.5 K/uL   RBC 4.22 3.87 - 5.11 MIL/uL   Hemoglobin 11.6 (L) 12.0 - 15.0 g/dL   HCT 63.7 63.9 - 53.9 %   MCV 85.8 80.0 - 100.0 fL   MCH 27.5 26.0 - 34.0 pg   MCHC 32.0 30.0 - 36.0 g/dL   RDW 83.1 (H) 88.4 - 84.4 %  Platelet Count 245 150 - 400 K/uL   nRBC 0.0 0.0 - 0.2 %   Neutrophils Relative % 59 %   Neutro Abs 2.7 1.7 - 7.7 K/uL   Lymphocytes Relative 38 %   Lymphs Abs 1.8 0.7 - 4.0 K/uL   Monocytes Relative 2 %   Monocytes Absolute 0.1 0.1 - 1.0 K/uL   Eosinophils Relative 1 %   Eosinophils Absolute 0.1 0.0 - 0.5 K/uL   Basophils Relative 0 %   Basophils Absolute 0.0 0.0 - 0.1 K/uL   Immature Granulocytes 0 %   Abs Immature Granulocytes 0.01 0.00 - 0.07 K/uL     RADIOGRAPHIC STUDIES:  I reviewed CT angiogram of the chest and  ultrasound of the lower extremities from 05/11/2024.  Orders Placed This Encounter  Procedures   CBC with Differential (Cancer Center Only)    Standing Status:   Future    Number of Occurrences:   1    Expiration Date:   06/18/2025   CMP (Cancer Center only)    Standing Status:   Future    Number of Occurrences:   1    Expiration Date:   06/18/2025   D-dimer, quantitative    Standing Status:   Future    Number of Occurrences:   1    Expiration Date:   06/18/2025   Beta-2 -glycoprotein i abs, IgG/M/A    Standing Status:   Future    Number of Occurrences:   1    Expiration Date:   06/18/2025   Cardiolipin antibodies, IgG, IgM, IgA    Standing Status:   Future    Number of Occurrences:   1    Expiration Date:   06/18/2025   Prothrombin gene mutation    Standing Status:   Future    Number of Occurrences:   1    Expiration Date:   06/18/2025   Factor 5 leiden    Standing Status:   Future    Number of Occurrences:   1    Expiration Date:   06/18/2025   Lupus anticoagulant panel    Standing Status:   Future    Number of Occurrences:   1    Expiration Date:   06/18/2025   CBC with Differential (Cancer Center Only)    Standing Status:   Future    Expiration Date:   06/19/2025   D-dimer, quantitative    Standing Status:   Future    Expiration Date:   06/19/2025    Future Appointments  Date Time Provider Department Center  06/25/2024  8:40 AM [facility]-ANNUAL WELLNESS VISIT [facility] None  07/10/2024  1:00 PM Jule Ronal CROME, PA-C [facility] None  08/21/2024  2:30 PM [facility] PHLEBOTOMIST [facility] None  08/21/2024  3:00 PM Falon Huesca, Chinita, MD [facility] None  08/22/2024  3:30 PM Jolinda Norene HERO, DO [facility] None  09/13/2024  2:00 PM Dolphus Reiter, MD [facility] None    I spent a total of 50 minutes during this encounter with the patient including review of chart and various tests results, discussions about plan of care and coordination of care plan.  This document was completed utilizing speech  recognition software. Grammatical errors, random word insertions, pronoun errors, and incomplete sentences are an occasional consequence of this system due to software limitations, ambient noise, and hardware issues. Any formal questions or concerns about the content, text or information contained within the body of this dictation should be directly addressed to the provider for clarification.  "

## 2024-06-19 ENCOUNTER — Telehealth: Payer: Self-pay | Admitting: Family Medicine

## 2024-06-19 ENCOUNTER — Encounter: Payer: Self-pay | Admitting: Oncology

## 2024-06-19 DIAGNOSIS — Z0000 Encounter for general adult medical examination without abnormal findings: Secondary | ICD-10-CM | POA: Insufficient documentation

## 2024-06-19 LAB — DRVVT MIX: dRVVT Mix: 43.4 s — ABNORMAL HIGH (ref 0.0–40.4)

## 2024-06-19 LAB — LUPUS ANTICOAGULANT PANEL
DRVVT: 50 s — ABNORMAL HIGH (ref 0.0–47.0)
PTT Lupus Anticoagulant: 32.6 s (ref 0.0–43.5)

## 2024-06-19 LAB — DRVVT CONFIRM: dRVVT Confirm: 0.6 ratio — ABNORMAL LOW (ref 0.8–1.2)

## 2024-06-19 NOTE — Assessment & Plan Note (Signed)
 Osteoarthritis contributing to severely reduced mobility, potentially increasing risk for DVT and PE. Uses a walker for mobility assistance.

## 2024-06-19 NOTE — Assessment & Plan Note (Signed)
 Incidentally found to have features concerning for PE on cardiac imaging, and was sent to ER on 05/11/2024.  She was found to have moderate amount of thrombus in the distal right main pulmonary artery extending to the lobar and segmental branches of the right upper and right lower lobes and borderline heart strain.  Ultrasound of the lower extremities showed left popliteal clot that appeared to be chronic.  She was briefly admitted.  Echocardiogram on 05/12/2024 did not show evidence of right heart strain.  She was treated initially with Lovenox  and transitioned over to Eliquis .   Possible contributing factors include reduced mobility due to osteoarthritis. She does have a family history of blood clots in niece and nephew.  We will pursue thrombophilia workup with prothrombin gene mutation, factor V Leiden mutation, anticardiolipin antibody, beta-2  glycoprotein antibody, lupus anticoagulant.  Rest of the hypercoagulable workup will be deferred as it can be falsely abnormal given recent thromboembolism.  D-dimer remains elevated at >20.00.  She was advised to continue Eliquis  5 mg twice daily.  Given the extent of pulmonary embolism, at least 6 months of anticoagulation would be recommended.  I will plan to see her approximately in 3 months for follow-up.  Will consider ultrasound of the lower extremity and CT angiogram of the chest, depending on symptoms on return visit.

## 2024-06-19 NOTE — Assessment & Plan Note (Signed)
 Management as mentioned above under pulmonary emboli

## 2024-06-19 NOTE — Telephone Encounter (Signed)
 Yes, ok to take BP before bed. Ginger root has an interaction with her Eliquis  = easier bleeding but it is not technically contraindicated to use.  So both supplements are fine but she might bleed easier on the green powder one

## 2024-06-19 NOTE — Assessment & Plan Note (Signed)
 Colonoscopy last performed in July 2020. Due for repeat colonoscopy this year due to family history of colon cancer. Procedure to be delayed until off blood thinners. - Revisit colonoscopy scheduling towards the end of the year

## 2024-06-19 NOTE — Telephone Encounter (Unsigned)
 Copied from CRM [id number]. Topic: General - Other >> Jun 19, 2024 11:46 AM Zebedee SAUNDERS wrote: Reason for CRM: Pt ask is it okay to take blood pressure before bed? Also, is it okay for her to take the green powder supplement? Please call pt at [phone].

## 2024-06-20 LAB — BETA-2-GLYCOPROTEIN I ABS, IGG/M/A
Beta-2 Glyco I IgG: 28 GPI IgG units — ABNORMAL HIGH (ref 0–20)
Beta-2-Glycoprotein I IgA: <9 GPI IgA units (ref 0–25)
Beta-2-Glycoprotein I IgM: <9 GPI IgM units (ref 0–32)

## 2024-06-20 NOTE — Telephone Encounter (Signed)
 Spoke with pt , she will hold off until she is off the blood thinners

## 2024-06-21 LAB — CARDIOLIPIN ANTIBODIES, IGG, IGM, IGA
Anticardiolipin IgA: 15 U/mL — ABNORMAL HIGH (ref 0–11)
Anticardiolipin IgG: <9 GPL U/mL (ref 0–14)
Anticardiolipin IgM: 40 [MPL'U]/mL — ABNORMAL HIGH (ref 0–12)

## 2024-06-21 LAB — PROTHROMBIN GENE MUTATION

## 2024-06-21 LAB — FACTOR 5 LEIDEN

## 2024-06-25 ENCOUNTER — Ambulatory Visit: Payer: 59

## 2024-06-25 VITALS — BP 138/83 | HR 108 | Ht 63.0 in | Wt 208.0 lb

## 2024-06-25 DIAGNOSIS — Z0000 Encounter for general adult medical examination without abnormal findings: Secondary | ICD-10-CM

## 2024-06-25 NOTE — Progress Notes (Signed)
 Subjective:   Jean Howell is a 68 y.o. who presents for a Medicare Wellness preventive visit.  As a reminder, Annual Wellness Visits don't include a physical exam, and some assessments may be limited, especially if this visit is performed virtually. We may recommend an in-person follow-up visit with your provider if needed.  Visit Complete: Virtual I connected with  Jean Howell on 06/25/24 by a audio enabled telemedicine application and verified that I am speaking with the correct person using two identifiers.  Patient Location: Home  Provider Location: Home Office  I discussed the limitations of evaluation and management by telemedicine. The patient expressed understanding and agreed to proceed.  Vital Signs: Because this visit was a virtual/telehealth visit, some criteria may be missing or patient reported. Any vitals not documented were not able to be obtained and vitals that have been documented are patient reported.  VideoDeclined- This patient declined Librarian, academic. Therefore the visit was completed with audio only.  Persons Participating in Visit: Patient.  AWV Questionnaire: No: Patient Medicare AWV questionnaire was not completed prior to this visit.  Cardiac Risk Factors include: advanced age (>14men, >31 women);dyslipidemia;hypertension;obesity (BMI >30kg/m2);smoking/ tobacco exposure     Objective:    Today's Vitals   06/25/24 0845  BP: 138/83  Pulse: (!) 108  Weight: 208 lb (94.3 kg)  Height: 5' 3 (1.6 m)  PainSc: 9    Body mass index is 36.85 kg/m.     06/25/2024    8:53 AM 06/18/2024    2:16 PM 05/11/2024   10:51 PM 05/11/2024    1:45 PM 02/12/2024    5:59 PM 06/23/2023   12:59 PM 09/20/2022   12:09 PM  Advanced Directives  Does Patient Have a Medical Advance Directive? No No  No No No No  Would patient like information on creating a medical advance directive?  No - Patient declined No - Patient declined  No - Patient  declined No - Patient declined No - Patient declined    Current Medications (verified) Outpatient Encounter Medications as of 06/25/2024  Medication Sig   Abatacept  (ORENCIA  CLICKJECT) 125 MG/ML SOAJ Inject 125 mg into the skin once a week.   acetaminophen  (TYLENOL ) 650 MG CR tablet Take 1,300 mg by mouth every 8 (eight) hours as needed for pain.   apixaban  (ELIQUIS ) 5 MG TABS tablet Take 2 tablets (10 mg total) by mouth 2 (two) times daily for 7 days, THEN 1 tablet (5 mg total) 2 (two) times daily.   betamethasone dipropionate (DIPROLENE) 0.05 % ointment Apply topically as needed.   Calcium -Magnesium-Zinc (CAL-MAG-ZINC PO) Take 1 tablet by mouth daily.   cyanocobalamin  (VITAMIN B12) 1000 MCG tablet Take 1 tablet by mouth daily.   Ergocalciferol  (VITAMIN D2) 10 MCG (400 UNIT) TABS Take 1 tablet by mouth daily.   fluticasone  (FLONASE ) 50 MCG/ACT nasal spray USE 2 SPRAYS IN EACH NOSTRIL DAILY (Patient taking differently: Place 2 sprays into both nostrils as needed.)   gabapentin  (NEURONTIN ) 600 MG tablet Take 1 tablet (600 mg total) by mouth at bedtime. **dose change   leflunomide  (ARAVA ) 20 MG tablet TAKE ONE (1) TABLET BY MOUTH EVERY DAY   losartan -hydrochlorothiazide  (HYZAAR) 100-12.5 MG tablet TAKE ONE (1) TABLET BY MOUTH EVERY DAY   Multiple Vitamin (MULTIVITAMIN PO) Take 1 tablet by mouth daily.   rosuvastatin  (CRESTOR ) 10 MG tablet Take 1 tablet (10 mg total) by mouth daily.   Suvorexant  (BELSOMRA ) 10 MG TABS Take 10 mg by mouth at bedtime  as needed (sleep). Place on hold   UNABLE TO FIND Med Name: CBD roll on, as needed   VITAMIN E  PO Take by mouth daily.   No facility-administered encounter medications on file as of 06/25/2024.    Allergies (verified) Enbrel  [etanercept ]   History: Past Medical History:  Diagnosis Date   Arthritis    Depression    Hypertension    Rheumatoid arthritis (HCC)    Past Surgical History:  Procedure Laterality Date   BREAST EXCISIONAL BIOPSY  Bilateral    pt unsure when- benign   BREAST SURGERY     nodules removed   COLONOSCOPY N/A 06/25/2019   Procedure: COLONOSCOPY;  Surgeon: Harvey Margo CROME, MD;  Location: [facility];  Service: Endoscopy;  Laterality: N/A;  1:30   MOLE REMOVAL Right 01/26/2022   foot (patient was put to sleep for procedure)   POLYPECTOMY  06/25/2019   Procedure: POLYPECTOMY;  Surgeon: Harvey Margo CROME, MD;  Location: [facility];  Service: Endoscopy;;   TOTAL KNEE ARTHROPLASTY Right 07/05/2022   Procedure: RIGHT TOTAL KNEE ARTHROPLASTY;  Surgeon: Jerri Kay HERO, MD;  Location: [facility];  Service: Orthopedics;  Laterality: Right;   Family History  Problem Relation Age of Onset   Thyroid  disease Mother    CVA Mother 31   Cancer Father    Colon cancer Father    Thyroid  disease Sister    Congestive Heart Failure Sister    Hypertension Sister    Diabetes Sister    Hypertension Sister    Diabetes Sister    Hypertension Sister    Hypertension Sister    Cancer Brother        kidney    Healthy Son    Thyroid  disease Niece    Breast cancer Neg Hx    Social History   Socioeconomic History   Marital status: Widowed    Spouse name: Not on file   Number of children: 1   Years of education: 12th   Highest education level: GED or equivalent  Occupational History   Occupation: retired  Tobacco Use   Smoking status: Former    Current packs/day: 0.00    Average packs/day: 1 pack/day for 15.0 years (15.0 ttl pk-yrs)    Types: Cigarettes    Start date: 64    Quit date: 2000    Years since quitting: 25.5    Passive exposure: Past   Smokeless tobacco: Never  Vaping Use   Vaping status: Never Used  Substance and Sexual Activity   Alcohol use: Not Currently   Drug use: Never   Sexual activity: Not Currently    Comment: widowed  Other Topics Concern   Not on file  Social History Narrative   Patient is a widow that resides independently in [place].  She has 1 son, who resides in [place].    Social Drivers of Corporate investment banker Strain: Low Risk  (06/25/2024)   Overall Financial Resource Strain (CARDIA)    Difficulty of Paying Living Expenses: Not hard at all  Recent Concern: Financial Resource Strain - Medium Risk (05/16/2024)   Overall Financial Resource Strain (CARDIA)    Difficulty of Paying Living Expenses: Somewhat hard  Food Insecurity: No Food Insecurity (06/25/2024)   Hunger Vital Sign    Worried About Running Out of Food in the Last Year: Never true    Ran Out of Food in the Last Year: Never true  Transportation Needs: No Transportation Needs (06/25/2024)   PRAPARE -  Administrator, Civil Service (Medical): No    Lack of Transportation (Non-Medical): No  Physical Activity: Insufficiently Active (06/25/2024)   Exercise Vital Sign    Days of Exercise per Week: 7 days    Minutes of Exercise per Session: 10 min  Stress: Stress Concern Present (06/25/2024)   [employer] of Occupational Health - Occupational Stress Questionnaire    Feeling of Stress: Very much  Social Connections: Socially Isolated (06/25/2024)   Social Connection and Isolation Panel    Frequency of Communication with Friends and Family: Never    Frequency of Social Gatherings with Friends and Family: Never    Attends Religious Services: Never    Database administrator or Organizations: No    Attends Banker Meetings: Never    Marital Status: Widowed    Tobacco Counseling Counseling given: Yes    Clinical Intake:  Pre-visit preparation completed: Yes  Pain : 0-10 (both legs) Pain Score: 9  Pain Type: Chronic pain Pain Location: Leg Pain Orientation: Left, Right Pain Descriptors / Indicators: Sore, Constant Pain Onset: More than a month ago Pain Frequency: Constant     BMI - recorded: 36.85 Nutritional Status: BMI > 30  Obese Nutritional Risks: None Diabetes: No  Lab Results  Component Value Date   HGBA1C 4.8 01/03/2024   HGBA1C 5.1  03/16/2022   HGBA1C 4.9 03/13/2021     How often do you need to have someone help you when you read instructions, pamphlets, or other written materials from your doctor or pharmacy?: 3 - Sometimes (pts niece)  Interpreter Needed?: No  Information entered by :: alia t/cma   Activities of Daily Living     06/25/2024    8:50 AM 05/11/2024   10:51 PM  In your present state of health, do you have any difficulty performing the following activities:  Hearing? 1 0  Vision? 0 0  Difficulty concentrating or making decisions? 0 0  Walking or climbing stairs? 1   Dressing or bathing? 0   Doing errands, shopping? 1 0  Comment use Public librarian and eating ? N   Using the Toilet? N   In the past six months, have you accidently leaked urine? Y   Do you have problems with loss of bowel control? N   Managing your Medications? N   Managing your Finances? N   Housekeeping or managing your Housekeeping? N     Patient Care Team: Jolinda Norene HERO, DO as PCP - General (Family Medicine) Mallipeddi, Diannah SQUIBB, MD as PCP - Cardiology (Cardiology) Shaaron Lamar HERO, MD as Consulting Physician (Gastroenterology) Dolphus Reiter, MD as Consulting Physician (Rheumatology) Shona Rush, MD (Dermatology) Ladora Ross Lacy Phebe, MD as Referring Physician (Optometry) Jerri Kay HERO, MD as Attending Physician (Orthopedic Surgery)  I have updated your Care Teams any recent Medical Services you may have received from other providers in the past year.     Assessment:   This is a routine wellness examination for Nastasha.  Hearing/Vision screen Hearing Screening - Comments:: Pt have some hearing dif Vision Screening - Comments:: Pt wear glasses pt goes to [facility] in [place]/last ov 2025   Goals Addressed             This Visit's Progress    Prevent falls   On track    Get to walking better, get knee pain under control Be more independent       Depression  Screen  06/25/2024    8:57 AM 06/18/2024    2:37 PM 06/18/2024    2:22 PM 05/21/2024    2:24 PM 01/03/2024    2:09 PM 06/23/2023    1:08 PM 02/08/2023    2:33 PM  PHQ 2/9 Scores  PHQ - 2 Score 3 0 2 0 0 0 0  PHQ- 9 Score 6 1 4  0 0 1 0    Fall Risk     06/25/2024    8:45 AM 05/21/2024    2:25 PM 01/03/2024    2:09 PM 06/23/2023    1:08 PM 02/08/2023    2:33 PM  Fall Risk   Falls in the past year? 0 0 0 0 0  Number falls in past yr: 0 0 0 0 0  Injury with Fall? 0 0 0 0 0  Risk for fall due to : No Fall Risks No Fall Risks No Fall Risks Impaired balance/gait;Impaired mobility;History of fall(s) No Fall Risks  Follow up Falls evaluation completed Falls evaluation completed Education provided Falls prevention discussed;Education provided Education provided    MEDICARE RISK AT HOME:  Medicare Risk at Home Any stairs in or around the home?: Yes If so, are there any without handrails?: Yes Home free of loose throw rugs in walkways, pet beds, electrical cords, etc?: Yes Adequate lighting in your home to reduce risk of falls?: Yes Life alert?: Yes Use of a cane, walker or w/c?: No Grab bars in the bathroom?: Yes Shower chair or bench in shower?: Yes Elevated toilet seat or a handicapped toilet?: Yes  TIMED UP AND GO:  Was the test performed?  no  Cognitive Function: 6CIT completed    06/16/2020    3:27 PM  MMSE - Mini Mental State Exam  Orientation to time 5  Orientation to Place 5  Registration 3  Attention/ Calculation 5  Recall 3  Language- name 2 objects 2  Language- repeat 1  Language- follow 3 step command 3  Language- read & follow direction 1  Write a sentence 1  Copy design 1  Total score 30        06/25/2024    8:53 AM 06/23/2023    1:06 PM 06/18/2022    3:00 PM  6CIT Screen  What Year? 0 points 0 points 0 points  What month? 0 points 0 points 0 points  What time? 0 points 0 points 0 points  Count back from 20 0 points 0 points 0 points  Months in reverse 0  points 0 points 0 points  Repeat phrase 0 points 0 points 2 points  Total Score 0 points 0 points 2 points    Immunizations Immunization History  Administered Date(s) Administered   Fluad Quad(high Dose 65+) 09/16/2020, 09/07/2022   Influenza, Quadrivalent, Recombinant, Inj, Pf 09/19/2019   PFIZER(Purple Top)SARS-COV-2 Vaccination 05/23/2020, 06/13/2020, 07/31/2020, 02/28/2021   PNEUMOCOCCAL CONJUGATE-20 07/07/2022    Screening Tests Health Maintenance  Topic Date Due   Zoster Vaccines- Shingrix (1 of 2) 08/21/2024 (Originally 07/12/1975)   DTaP/Tdap/Td (1 - Tdap) 01/02/2025 (Originally 07[birth year]12/1973)   DEXA SCAN  05/21/2025 (Originally 09/16/2022)   COVID-19 Vaccine (5 - 2024-25 season) 07/11/2025 (Originally 08/07/2023)   INFLUENZA VACCINE  07/06/2024   Medicare Annual Wellness (AWV)  06/25/2025   MAMMOGRAM  07/10/2025   Colonoscopy  06/24/2029   Pneumococcal Vaccine: 50+ Years  Completed   Hepatitis C Screening  Completed   Hepatitis B Vaccines  Aged Out   HPV VACCINES  Aged Out  Meningococcal B Vaccine  Aged Out    Health Maintenance  There are no preventive care reminders to display for this patient.  Health Maintenance Items Addressed: See Nurse Notes at the end of this note  Additional Screening:  Vision Screening: Recommended annual ophthalmology exams for early detection of glaucoma and other disorders of the eye. Would you like a referral to an eye doctor? No    Dental Screening: Recommended annual dental exams for proper oral hygiene  Community Resource Referral / Chronic Care Management: CRR required this visit?  No   CCM required this visit?  No   Plan:    I have personally reviewed and noted the following in the patient's chart:   Medical and social history Use of alcohol, tobacco or illicit drugs  Current medications and supplements including opioid prescriptions. Patient is not currently taking opioid prescriptions. Functional ability and  status Nutritional status Physical activity Advanced directives List of other physicians Hospitalizations, surgeries, and ER visits in previous 12 months Vitals Screenings to include cognitive, depression, and falls Referrals and appointments  In addition, I have reviewed and discussed with patient certain preventive protocols, quality metrics, and best practice recommendations. A written personalized care plan for preventive services as well as general preventive health recommendations were provided to patient.   Ozie Ned, CMA   06/25/2024   After Visit Summary: (MyChart) Due to this being a telephonic visit, the after visit summary with patients personalized plan was offered to patient via MyChart   Notes: Nothing significant to report at this time.

## 2024-06-25 NOTE — Patient Instructions (Signed)
 Jean Howell , Thank you for taking time out of your busy schedule to complete your Annual Wellness Visit with me. I enjoyed our conversation and look forward to speaking with you again next year. I, as well as your care team,  appreciate your ongoing commitment to your health goals. Please review the following plan we discussed and let me know if I can assist you in the future. Your Game plan/ To Do List   Follow up Visits: Next Medicare AWV with our clinical staff: 06/26/25 at 8:40a.m.   Next Office Visit with your provider: 08/22/24 at 3:30p.m.  Clinician Recommendations:  Aim for 30 minutes of exercise or brisk walking, 6-8 glasses of water , and 5 servings of fruits and vegetables each day.       This is a list of the screening recommended for you and due dates:  Health Maintenance  Topic Date Due   Zoster (Shingles) Vaccine (1 of 2) 08/21/2024*   DTaP/Tdap/Td vaccine (1 - Tdap) 01/02/2025*   DEXA scan (bone density measurement)  05/21/2025*   COVID-19 Vaccine (5 - 2024-25 season) 07/11/2025*   Flu Shot  07/06/2024   Medicare Annual Wellness Visit  06/25/2025   Mammogram  07/10/2025   Colon Cancer Screening  06/24/2029   Pneumococcal Vaccine for age over 67  Completed   Hepatitis C Screening  Completed   Hepatitis B Vaccine  Aged Out   HPV Vaccine  Aged Out   Meningitis B Vaccine  Aged Out  *Topic was postponed. The date shown is not the original due date.    Advanced directives: (Declined) Advance directive discussed with you today. Even though you declined this today, please call our office should you change your mind, and we can give you the proper paperwork for you to fill out. Advance Care Planning is important because it:  [x]  Makes sure you receive the medical care that is consistent with your values, goals, and preferences  [x]  It provides guidance to your family and loved ones and reduces their decisional burden about whether or not they are making the right decisions based  on your wishes.  Follow the link provided in your after visit summary or read over the paperwork we have mailed to you to help you started getting your Advance Directives in place. If you need assistance in completing these, please reach out to us  so that we can help you!  See attachments for Preventive Care and Fall Prevention Tips.

## 2024-06-27 ENCOUNTER — Other Ambulatory Visit: Payer: Self-pay | Admitting: Rheumatology

## 2024-06-27 ENCOUNTER — Other Ambulatory Visit (HOSPITAL_COMMUNITY): Payer: Self-pay

## 2024-06-27 ENCOUNTER — Other Ambulatory Visit: Payer: Self-pay

## 2024-06-27 ENCOUNTER — Telehealth: Payer: Self-pay

## 2024-06-27 DIAGNOSIS — M0579 Rheumatoid arthritis with rheumatoid factor of multiple sites without organ or systems involvement: Secondary | ICD-10-CM

## 2024-06-27 DIAGNOSIS — Z79899 Other long term (current) drug therapy: Secondary | ICD-10-CM

## 2024-06-27 MED ORDER — ORENCIA CLICKJECT 125 MG/ML ~~LOC~~ SOAJ
125.0000 mg | SUBCUTANEOUS | 0 refills | Status: DC
  Filled 2024-06-27 – 2024-06-29 (×2): qty 4, 28d supply, fill #0

## 2024-06-27 NOTE — Telephone Encounter (Signed)
 Last Fill: 04/06/2024  Labs: 06/18/2024  Hemoglobin 11.6 RDW 16.8 Rest of CBC and CMP WNL   TB Gold: 03/02/2023 TB Gold Negative    Next Visit: 09/13/2024  Last Visit: 03/14/2024  DX: Rheumatoid arthritis involving multiple sites with positive rheumatoid factor   Current Dose per office note 03/14/2024: Orencia  125mg  sq injections once weekly   Okay to refill Orencia ?   Contacted patient to update TB Gold, patient is wanting to do it at the [place]  [facility], will call us  to release them before she goes.

## 2024-06-27 NOTE — Addendum Note (Signed)
 Addended by: Chassie Pennix M on: 06/27/2024 04:34 PM   Modules accepted: Orders

## 2024-06-27 NOTE — Progress Notes (Unsigned)
 Complex Care Management Note Care Guide Note  06/27/2024 Name: Jean Howell MRN: [id number] DOB: 03-20-1956   Complex Care Management Outreach Attempts: An unsuccessful telephone outreach was attempted today to offer the patient information about available complex care management services.  Follow Up Plan:  Additional outreach attempts will be made to offer the patient complex care management information and services.   Encounter Outcome:  No Answer  Jeoffrey Buffalo , RMA     [place]  [facility], [facility] Guide  Direct Dial: [phone]  Website: [email or web address]

## 2024-06-28 ENCOUNTER — Other Ambulatory Visit: Payer: Self-pay

## 2024-06-29 ENCOUNTER — Other Ambulatory Visit: Payer: Self-pay | Admitting: Pharmacy Technician

## 2024-06-29 ENCOUNTER — Other Ambulatory Visit: Payer: Self-pay

## 2024-06-29 NOTE — Progress Notes (Signed)
 Complex Care Management Note  Care Guide Note 06/29/2024 Name: Jean Howell MRN: [id number] DOB: 1955/12/27  Jean Howell is a 68 y.o. year old female who sees Jolinda Norene HERO, DO for primary care. I reached out to Avelina Birk by phone today to offer complex care management services.  [name] was given information about Complex Care Management services today including:   The Complex Care Management services include support from the care team which includes your Nurse Care Manager, Clinical Social Worker, or Pharmacist.  The Complex Care Management team is here to help remove barriers to the health concerns and goals most important to you. Complex Care Management services are voluntary, and the patient may decline or stop services at any time by request to their care team member.   Complex Care Management Consent Status: Patient agreed to services and verbal consent obtained.   Follow up plan:  Telephone appointment with complex care management team member scheduled for:  07/06/2024  Encounter Outcome:  Patient Scheduled  Jeoffrey Buffalo , RMA     [place]  [facility] Guide  Direct Dial: [phone]  Website: [email or web address]

## 2024-06-29 NOTE — Progress Notes (Signed)
 Specialty Pharmacy Refill Coordination Note  Jean Howell is a 68 y.o. female contacted today regarding refills of specialty medication(s) Abatacept  (Orencia  [employer])   Patient requested Delivery   Delivery date: 07/03/24   Verified address: [address] [place] [address]   Medication will be filled on 06/29/24.   Ship 7/28 for injection on 7/31.

## 2024-07-02 ENCOUNTER — Other Ambulatory Visit: Payer: Self-pay

## 2024-07-02 ENCOUNTER — Other Ambulatory Visit

## 2024-07-02 DIAGNOSIS — D649 Anemia, unspecified: Secondary | ICD-10-CM | POA: Diagnosis not present

## 2024-07-02 DIAGNOSIS — Z79899 Other long term (current) drug therapy: Secondary | ICD-10-CM | POA: Diagnosis not present

## 2024-07-03 ENCOUNTER — Other Ambulatory Visit: Payer: Self-pay | Admitting: *Deleted

## 2024-07-03 ENCOUNTER — Ambulatory Visit: Payer: Self-pay | Admitting: Physician Assistant

## 2024-07-03 LAB — CMP14+EGFR
ALT: 11 IU/L (ref 0–32)
AST: 22 IU/L (ref 0–40)
Albumin: 3.4 g/dL — AB (ref 3.9–4.9)
Alkaline Phosphatase: 71 IU/L (ref 44–121)
BUN/Creatinine Ratio: 14 (ref 12–28)
BUN: 13 mg/dL (ref 8–27)
Bilirubin Total: <0.2 mg/dL (ref 0.0–1.2)
CO2: 24 mmol/L (ref 20–29)
Calcium: 9.3 mg/dL (ref 8.7–10.3)
Chloride: 101 mmol/L (ref 96–106)
Creatinine, Ser: 0.93 mg/dL (ref 0.57–1.00)
Globulin, Total: 3.5 g/dL (ref 1.5–4.5)
Glucose: 87 mg/dL (ref 70–99)
Potassium: 3.8 mmol/L (ref 3.5–5.2)
Sodium: 139 mmol/L (ref 134–144)
Total Protein: 6.9 g/dL (ref 6.0–8.5)
eGFR: 67 mL/min/1.73 (ref >59)

## 2024-07-03 LAB — CBC WITH DIFFERENTIAL/PLATELET
Basophils Absolute: 0 x10E3/uL (ref 0.0–0.2)
Basos: 0 %
EOS (ABSOLUTE): 0.1 x10E3/uL (ref 0.0–0.4)
Eos: 2 %
Hematocrit: 34.3 % (ref 34.0–46.6)
Hemoglobin: 10.7 g/dL — ABNORMAL LOW (ref 11.1–15.9)
Immature Grans (Abs): 0 x10E3/uL (ref 0.0–0.1)
Immature Granulocytes: 0 %
Lymphocytes Absolute: 1.8 x10E3/uL (ref 0.7–3.1)
Lymphs: 46 %
MCH: 27.1 pg (ref 26.6–33.0)
MCHC: 31.2 g/dL — ABNORMAL LOW (ref 31.5–35.7)
MCV: 87 fL (ref 79–97)
Monocytes Absolute: 0.1 x10E3/uL (ref 0.1–0.9)
Monocytes: 2 %
Neutrophils Absolute: 2 x10E3/uL (ref 1.4–7.0)
Neutrophils: 50 %
Platelets: 260 x10E3/uL (ref 150–450)
RBC: 3.95 x10E6/uL (ref 3.77–5.28)
RDW: 15.2 % (ref 11.7–15.4)
WBC: 3.9 x10E3/uL (ref 3.4–10.8)

## 2024-07-03 NOTE — Progress Notes (Signed)
 Albumin is borderline low. Rest of CMP WNL Hemoglobin is borderline low-10.7. MCHC is borderline low.  rest of CBC WNL.  Please see if iron panel can be added.

## 2024-07-04 LAB — SPECIMEN STATUS REPORT

## 2024-07-05 ENCOUNTER — Other Ambulatory Visit: Payer: Self-pay | Admitting: Rheumatology

## 2024-07-05 LAB — IRON AND TIBC
Iron Saturation: 11 % — ABNORMAL LOW (ref 15–55)
Iron: 30 ug/dL (ref 27–139)
Total Iron Binding Capacity: 283 ug/dL (ref 250–450)
UIBC: 253 ug/dL (ref 118–369)

## 2024-07-05 LAB — FERRITIN: Ferritin: 259 ng/mL — ABNORMAL HIGH (ref 15–150)

## 2024-07-05 LAB — SPECIMEN STATUS REPORT

## 2024-07-05 NOTE — Telephone Encounter (Signed)
 Last Fill: 04/09/2024  Labs: 07/02/2024 Albumin is borderline low. Rest of CMP WNL Hemoglobin is borderline low-10.7. MCHC is borderline low.  rest of CBC WNL.  Next Visit: 09/13/2024  Last Visit: 03/14/2024  DX: Rheumatoid arthritis involving multiple sites with positive rheumatoid factor   Current Dose per office note 03/14/2024: Arava  20 mg 1 tablet by mouth daily   Okay to refill Arava  ?

## 2024-07-05 NOTE — Progress Notes (Signed)
 Ferritin remains elevated and iron saturation is low.  Please notify the patient. Please forward results to hematologist.

## 2024-07-06 ENCOUNTER — Other Ambulatory Visit: Payer: Self-pay | Admitting: Licensed Clinical Social Worker

## 2024-07-06 NOTE — Patient Outreach (Signed)
 Complex Care Management   Visit Note  07/09/2024  Name:  Jean Howell MRN: [id number] DOB: 05-23-1956  Situation: Referral received for Complex Care Management related to stress regarding SDOH needs. I obtained verbal consent from Patient.  Visit completed with patient and VBCI LCSW  on the phone  Background:   Past Medical History:  Diagnosis Date   Arthritis    Depression    Hypertension    Rheumatoid arthritis (HCC)    Psychosocial Psychosocial Symptoms Reported: Irritability, Sadness - if selected complete PHQ 2-9 No reported SI/HI Behavioral Management Strategies: Coping strategies Behavioral Health Self-Management Outcome: 4 (good) Major Change/Loss/Stressor/Fears (CP): Resources Techniques to [employer] with Loss/Stress/Change: Diversional activities Quality of Family Relationships: involved, supportive      06/25/2024    8:57 AM  Depression screen PHQ 2/9  Decreased Interest 3  Down, Depressed, Hopeless 0  PHQ - 2 Score 3  Altered sleeping 0  Tired, decreased energy 3  Change in appetite 0  Feeling bad or failure about yourself  0  Trouble concentrating 0  Moving slowly or fidgety/restless 0  Suicidal thoughts 0  PHQ-9 Score 6  Difficult doing work/chores Somewhat difficult   SDOH Screenings   Food Insecurity: Food Insecurity Present (07/06/2024)  Housing: Low Risk  (07/06/2024)  Transportation Needs: No Transportation Needs (06/25/2024)  Utilities: Not At Risk (06/25/2024)  Alcohol Screen: Low Risk  (06/23/2023)  Depression (PHQ2-9): Medium Risk (06/25/2024)  Financial Resource Strain: Low Risk  (06/25/2024)  Recent Concern: Financial Resource Strain - Medium Risk (05/16/2024)  Physical Activity: Insufficiently Active (06/25/2024)  Social Connections: Socially Isolated (06/25/2024)  Stress: Stress Concern Present (06/25/2024)  Tobacco Use: Medium Risk (06/25/2024)  Health Literacy: Adequate Health Literacy (06/25/2024)   There were no vitals filed for this  visit.  Medications Reviewed Today     Reviewed by Merlynn Lyle CROME, LCSW (Social Worker) on 07/09/24 at 1702  Med List Status: <None>   Medication Order Taking? Sig Documenting Provider Last Dose Status Informant  Abatacept  (ORENCIA  CLICKJECT) 125 MG/ML SOAJ [id number]  Inject 125 mg into the skin once a week. Dolphus Reiter, MD  Active   acetaminophen  (TYLENOL ) 650 MG CR tablet [id number]  Take 1,300 mg by mouth every 8 (eight) hours as needed for pain. [provider]  Active Self, Pharmacy Records  apixaban  (ELIQUIS ) 5 MG TABS tablet [id number]  Take 2 tablets (10 mg total) by mouth 2 (two) times daily for 7 days, THEN 1 tablet (5 mg total) 2 (two) times daily. Willette Adriana LABOR, MD  Expired 06/25/24 2359   betamethasone dipropionate (DIPROLENE) 0.05 % ointment [id number]  Apply topically as needed. [provider]  Active Self, Pharmacy Records  Calcium -Magnesium-Zinc (CAL-MAG-ZINC PO) [id number]  Take 1 tablet by mouth daily. [provider]  Active Self, Pharmacy Records  cyanocobalamin  (VITAMIN B12) 1000 MCG tablet [id number]  Take 1 tablet by mouth daily. [provider]  Active Self, Pharmacy Records  Ergocalciferol  (VITAMIN D2) 10 MCG (400 UNIT) TABS [id number]  Take 1 tablet by mouth daily. [provider]  Active Self, Pharmacy Records  fluticasone  (FLONASE ) 50 MCG/ACT nasal spray [id number]  USE 2 SPRAYS IN EACH NOSTRIL DAILY  Patient taking differently: Place 2 sprays into both nostrils as needed.   Jolinda Norene HERO, DO  Active Self, Pharmacy Records  gabapentin  (NEURONTIN ) 600 MG tablet [id number]  Take 1 tablet (600 mg total) by mouth at bedtime. **dose change Jolinda Norene HERO, DO  Active Self, Pharmacy Records  leflunomide  (ARAVA ) 20  MG tablet [id number]  TAKE ONE (1) TABLET BY MOUTH EVERY DAY Cheryl Waddell HERO, PA-C  Active   losartan -hydrochlorothiazide  (HYZAAR) 100-12.5 MG tablet [id number]  TAKE ONE (1) TABLET BY MOUTH EVERY DAY  Jolinda Norene HERO, DO  Active Self, Pharmacy Records  Multiple Vitamin (MULTIVITAMIN PO) [id number]  Take 1 tablet by mouth daily. [provider]  Active Self, Pharmacy Records  rosuvastatin  (CRESTOR ) 10 MG tablet [id number]  Take 1 tablet (10 mg total) by mouth daily. Jolinda Norene HERO, DO  Active Self, Pharmacy Records  Suvorexant  (BELSOMRA ) 10 MG TABS [id number]  Take 10 mg by mouth at bedtime as needed (sleep). Place on hold Jolinda Norene HERO, DO  Active Self, Pharmacy Records  UNABLE TO LARA [id number]  Med Name: CBD roll on, as needed [provider]  Active Self, Pharmacy Records  VITAMIN E  PO [id number]  Take by mouth daily. [provider]  Active Self, Pharmacy Records            Recommendation:   PCP Follow-up Continue Current Plan of Care  Follow Up Plan:   Telephone follow-up in 1 month  Lyle Rung, BSW, MSW, LCSW Licensed Clinical Social Worker [employer]   [facility] [email or web address] Direct Dial: [phone]

## 2024-07-09 NOTE — Patient Instructions (Signed)
 Visit Information  Thank you for taking time to visit with me today. Please don't hesitate to contact me if I can be of assistance to you before our next scheduled appointment.  Our next appointment is by telephone on 07/20/24 at 9 am Please call the care guide team at [phone] if you need to cancel or reschedule your appointment.   Following is a copy of your care plan:   Goals Addressed             This Visit's Progress    LCSW VBCI Social Work Care Plan       Problems:   Limited access to caregiver, Limited social support, and Social Isolation, Disease Management support and education needs related to Stress at managing health care, and Financial Strain  and Transportation  CSW Clinical Goal(s):   Over the next 90 days the Patient will attend all scheduled medical appointments as evidenced by patient report and care team review of appointment completion in electronic MEDICAL RECORD NUMBERby VBCI LCSW demonstrate a reduction in symptoms related to Caregiver Stress .  Interventions:  Level of Care Concerns in a patient with HTN Current level of care: home, alone Evaluation of patient's unmet needs in current living environment ADL's Assessed needs, level of care concerns, how currently meeting needs and barriers to care Discussed family support and building support system : Advocating for self Discussed private pay options for personal care needs ( ) DSS in-home aide program:(BSW referral placed for PCSI) Problem Solving/Task Center strategies reviewed Solution-Focused Strategies employed:  Active listening / Reflection utilized Mining engineer reviewed Caregiver stress acknowledged :  Financial risk analyst / information provided Depression screen reviewed Discussed referral for psychiatry: Pt will consider this Discussed referral options to connect for ongoing therapy: Pt will consider this resource addition. Emotional Support Provided Quality of sleep assessed  & Sleep Hygiene techniques promoted Suicidal Ideation/Homicidal Ideation assessed: No SI/HI Social Determinants of Health in Patient with HTN and Stress at Managing Physical and Mental Health: SDOH assessments completed: Financial Strain , Food Insecurity , and Transportation Evaluation of current treatment plan related to unmet needs Food resources: Find Help Referral placed, VBCI BSW referral placed  Patient Goals/Self-Care Activities:  Increase coping skills, healthy habits, self-management skills, and stress reduction  Plan:   The care management team will reach out to the patient again over the next 30 days.        Please call the Suicide and Crisis Lifeline: 988 call the [place]  National Suicide Prevention Lifeline: 701-181-8393 or TTY: (706)096-2820 TTY (747)612-0591) to talk to a trained counselor go to [facility] [address], [place] [phone]) call the [facility] Crisis Line: [phone] if you are experiencing a Mental Health or Behavioral Health Crisis or need someone to talk to.  Patient verbalizes understanding of instructions and care plan provided today and agrees to view in MyChart. Active MyChart status and patient understanding of how to access instructions and care plan via MyChart confirmed with patient.     Lyle Rung, BSW, MSW, LCSW Licensed Clinical Social Worker [employer]   [facility] [email or web address] Direct Dial: [phone]

## 2024-07-10 ENCOUNTER — Observation Stay (HOSPITAL_COMMUNITY)

## 2024-07-10 ENCOUNTER — Observation Stay (HOSPITAL_COMMUNITY)
Admission: EM | Admit: 2024-07-10 | Discharge: 2024-07-11 | Disposition: A | Discharge status: Home / Self Care | Attending: Internal Medicine | Admitting: Internal Medicine

## 2024-07-10 ENCOUNTER — Other Ambulatory Visit (INDEPENDENT_AMBULATORY_CARE_PROVIDER_SITE_OTHER)

## 2024-07-10 ENCOUNTER — Other Ambulatory Visit: Payer: Self-pay

## 2024-07-10 ENCOUNTER — Ambulatory Visit (INDEPENDENT_AMBULATORY_CARE_PROVIDER_SITE_OTHER): Payer: 59 | Admitting: Physician Assistant

## 2024-07-10 ENCOUNTER — Encounter (HOSPITAL_COMMUNITY): Payer: Self-pay

## 2024-07-10 DIAGNOSIS — R42 Dizziness and giddiness: Secondary | ICD-10-CM

## 2024-07-10 DIAGNOSIS — E785 Hyperlipidemia, unspecified: Secondary | ICD-10-CM | POA: Diagnosis present

## 2024-07-10 DIAGNOSIS — M5430 Sciatica, unspecified side: Secondary | ICD-10-CM | POA: Diagnosis not present

## 2024-07-10 DIAGNOSIS — Z743 Need for continuous supervision: Secondary | ICD-10-CM | POA: Diagnosis not present

## 2024-07-10 DIAGNOSIS — Z7901 Long term (current) use of anticoagulants: Secondary | ICD-10-CM | POA: Insufficient documentation

## 2024-07-10 DIAGNOSIS — R911 Solitary pulmonary nodule: Secondary | ICD-10-CM | POA: Insufficient documentation

## 2024-07-10 DIAGNOSIS — Z86718 Personal history of other venous thrombosis and embolism: Secondary | ICD-10-CM | POA: Insufficient documentation

## 2024-07-10 DIAGNOSIS — I639 Cerebral infarction, unspecified: Secondary | ICD-10-CM | POA: Diagnosis not present

## 2024-07-10 DIAGNOSIS — I10 Essential (primary) hypertension: Secondary | ICD-10-CM | POA: Diagnosis present

## 2024-07-10 DIAGNOSIS — R55 Syncope and collapse: Secondary | ICD-10-CM | POA: Diagnosis not present

## 2024-07-10 DIAGNOSIS — I951 Orthostatic hypotension: Secondary | ICD-10-CM | POA: Diagnosis not present

## 2024-07-10 DIAGNOSIS — M069 Rheumatoid arthritis, unspecified: Secondary | ICD-10-CM | POA: Insufficient documentation

## 2024-07-10 DIAGNOSIS — Z96651 Presence of right artificial knee joint: Secondary | ICD-10-CM

## 2024-07-10 DIAGNOSIS — R404 Transient alteration of awareness: Secondary | ICD-10-CM | POA: Diagnosis not present

## 2024-07-10 DIAGNOSIS — R531 Weakness: Secondary | ICD-10-CM | POA: Diagnosis not present

## 2024-07-10 DIAGNOSIS — M170 Bilateral primary osteoarthritis of knee: Secondary | ICD-10-CM | POA: Diagnosis not present

## 2024-07-10 DIAGNOSIS — F411 Generalized anxiety disorder: Secondary | ICD-10-CM | POA: Insufficient documentation

## 2024-07-10 DIAGNOSIS — M25551 Pain in right hip: Secondary | ICD-10-CM

## 2024-07-10 DIAGNOSIS — Z86711 Personal history of pulmonary embolism: Secondary | ICD-10-CM | POA: Insufficient documentation

## 2024-07-10 DIAGNOSIS — E7849 Other hyperlipidemia: Secondary | ICD-10-CM | POA: Diagnosis not present

## 2024-07-10 LAB — COMPREHENSIVE METABOLIC PANEL WITH GFR
ALT: 14 U/L (ref 0–44)
AST: 21 U/L (ref 15–41)
Albumin: 2.6 g/dL — ABNORMAL LOW (ref 3.5–5.0)
Alkaline Phosphatase: 47 U/L (ref 38–126)
Anion gap: 10 (ref 5–15)
BUN: 20 mg/dL (ref 8–23)
CO2: 27 mmol/L (ref 22–32)
Calcium: 9.2 mg/dL (ref 8.9–10.3)
Chloride: 100 mmol/L (ref 98–111)
Creatinine, Ser: 1 mg/dL (ref 0.44–1.00)
GFR, Estimated: >60 mL/min (ref >60)
Glucose, Bld: 108 mg/dL — ABNORMAL HIGH (ref 70–99)
Potassium: 3.4 mmol/L — ABNORMAL LOW (ref 3.5–5.1)
Sodium: 137 mmol/L (ref 135–145)
Total Bilirubin: 0.3 mg/dL (ref 0.0–1.2)
Total Protein: 7.3 g/dL (ref 6.5–8.1)

## 2024-07-10 LAB — CBC
HCT: 35.2 % — ABNORMAL LOW (ref 36.0–46.0)
Hemoglobin: 11.2 g/dL — ABNORMAL LOW (ref 12.0–15.0)
MCH: 27.5 pg (ref 26.0–34.0)
MCHC: 31.8 g/dL (ref 30.0–36.0)
MCV: 86.5 fL (ref 80.0–100.0)
Platelets: 274 K/uL (ref 150–400)
RBC: 4.07 MIL/uL (ref 3.87–5.11)
RDW: 16.5 % — ABNORMAL HIGH (ref 11.5–15.5)
WBC: 3.8 K/uL — ABNORMAL LOW (ref 4.0–10.5)
nRBC: 0 % (ref 0.0–0.2)

## 2024-07-10 LAB — TROPONIN I (HIGH SENSITIVITY)
Troponin I (High Sensitivity): 6 ng/L (ref <18)
Troponin I (High Sensitivity): 6 ng/L (ref <18)

## 2024-07-10 MED ORDER — ONDANSETRON HCL 4 MG/2ML IJ SOLN
4.0000 mg | Freq: Four times a day (QID) | INTRAMUSCULAR | Status: DC | PRN
  Administered 2024-07-11: 4 mg via INTRAVENOUS
  Filled 2024-07-10: qty 2

## 2024-07-10 MED ORDER — SODIUM CHLORIDE 0.9 % IV BOLUS
500.0000 mL | Freq: Once | INTRAVENOUS | Status: AC
  Administered 2024-07-10: 500 mL via INTRAVENOUS

## 2024-07-10 MED ORDER — ACETAMINOPHEN 325 MG PO TABS: 650.0000 mg | ORAL_TABLET | Freq: Four times a day (QID) | ORAL | Status: DC | PRN

## 2024-07-10 MED ORDER — IOHEXOL 350 MG/ML SOLN
75.0000 mL | Freq: Once | INTRAVENOUS | Status: AC | PRN
  Administered 2024-07-10: 75 mL via INTRAVENOUS

## 2024-07-10 MED ORDER — SODIUM CHLORIDE 0.9% FLUSH: 3.0000 mL | INTRAVENOUS | Status: DC | PRN

## 2024-07-10 MED ORDER — SODIUM CHLORIDE 0.9 % IV SOLN: 250.0000 mL | INTRAVENOUS | Status: DC | PRN

## 2024-07-10 MED ORDER — LEFLUNOMIDE 10 MG PO TABS
20.0000 mg | ORAL_TABLET | Freq: Every day | ORAL | Status: DC
  Administered 2024-07-11: 20 mg via ORAL
  Filled 2024-07-10: qty 2

## 2024-07-10 MED ORDER — APIXABAN 5 MG PO TABS
5.0000 mg | ORAL_TABLET | Freq: Two times a day (BID) | ORAL | Status: DC
  Administered 2024-07-10 – 2024-07-11 (×2): 5 mg via ORAL
  Filled 2024-07-10 (×2): qty 1

## 2024-07-10 MED ORDER — DOCUSATE SODIUM 100 MG PO CAPS
100.0000 mg | ORAL_CAPSULE | Freq: Two times a day (BID) | ORAL | Status: DC
  Administered 2024-07-10 – 2024-07-11 (×2): 100 mg via ORAL
  Filled 2024-07-10 (×2): qty 1

## 2024-07-10 MED ORDER — ONDANSETRON HCL 4 MG PO TABS: 4.0000 mg | ORAL_TABLET | Freq: Four times a day (QID) | ORAL | Status: DC | PRN

## 2024-07-10 MED ORDER — LACTATED RINGERS IV BOLUS
500.0000 mL | Freq: Once | INTRAVENOUS | Status: AC
  Administered 2024-07-10: 500 mL via INTRAVENOUS

## 2024-07-10 MED ORDER — ROSUVASTATIN CALCIUM 5 MG PO TABS
10.0000 mg | ORAL_TABLET | Freq: Every day | ORAL | Status: DC
Start: 2024-07-11 — End: 2024-07-11
  Administered 2024-07-11: 10 mg via ORAL
  Filled 2024-07-10: qty 2

## 2024-07-10 MED ORDER — SODIUM CHLORIDE 0.9% FLUSH
3.0000 mL | Freq: Two times a day (BID) | INTRAVENOUS | Status: DC
  Administered 2024-07-10: 3 mL via INTRAVENOUS

## 2024-07-10 MED ORDER — LACTATED RINGERS IV SOLN: INTRAVENOUS | Status: DC

## 2024-07-10 MED ORDER — VITAMIN B-12 1000 MCG PO TABS
1000.0000 ug | ORAL_TABLET | Freq: Every day | ORAL | Status: DC
  Administered 2024-07-11: 1000 ug via ORAL
  Filled 2024-07-10: qty 1

## 2024-07-10 MED ORDER — ACETAMINOPHEN 650 MG RE SUPP: 650.0000 mg | Freq: Four times a day (QID) | RECTAL | Status: DC | PRN

## 2024-07-10 NOTE — ED Provider Notes (Signed)
 [place] [facility]PART[facility] AT [facility] Provider Note   CSN: [id number] Arrival date & time: 07/10/24  1557     Patient presents with: Dizziness   Jean Howell is a 68 y.o. female.    Dizziness    Patient has a history of arthritis, hypertension depression.  Patient also has history of pulmonary embolism and is on Eliquis .  Patient states she has been having some trouble with her hip and knee on the right side.  Patient went to see an orthopedic doctor today.  She was there for this hip issue.  Patient states while she was at the doctor's office she had an episode where she started to feel lightheaded and felt like she needed to sit down.  Patient states she closed her eyes and said a prayer.  She does not think she lost consciousness.  Patient states she was then getting x-rays when she started having some back discomfort and felt like she needed to sit down.  Patient closed her eyes again.  The physician assistant that was seeing her felt that patient was having syncopal/near syncopal episodes.  She was sent to the ED for further evaluation.  Patient herself was not sure that she ever completely lost consciousness.  She states she feels fine now.  She is not having any chest pain or abdominal pain.  S no shortness of breath.  He has not noticed any blood in her stool.  No vomiting or diarrhea.  Prior to Admission medications   Medication Sig Start Date End Date Taking? Authorizing Provider  Abatacept  (ORENCIA  CLICKJECT) 125 MG/ML SOAJ Inject 125 mg into the skin once a week. 06/27/24   Dolphus Reiter, MD  acetaminophen  (TYLENOL ) 650 MG CR tablet Take 1,300 mg by mouth every 8 (eight) hours as needed for pain.    [provider]  apixaban  (ELIQUIS ) 5 MG TABS tablet Take 2 tablets (10 mg total) by mouth 2 (two) times daily for 7 days, THEN 1 tablet (5 mg total) 2 (two) times daily. 05/13/24 06/25/24  Shahmehdi, Seyed A, MD  betamethasone dipropionate (DIPROLENE)  0.05 % ointment Apply topically as needed.    [provider]  Calcium -Magnesium-Zinc (CAL-MAG-ZINC PO) Take 1 tablet by mouth daily.    [provider]  cyanocobalamin  (VITAMIN B12) 1000 MCG tablet Take 1 tablet by mouth daily. 12/16/21   [provider]  Ergocalciferol  (VITAMIN D2) 10 MCG (400 UNIT) TABS Take 1 tablet by mouth daily. 12/16/21   [provider]  fluticasone  (FLONASE ) 50 MCG/ACT nasal spray USE 2 SPRAYS IN EACH NOSTRIL DAILY Patient taking differently: Place 2 sprays into both nostrils as needed. 04/18/23   Jolinda Norene HERO, DO  gabapentin  (NEURONTIN ) 600 MG tablet Take 1 tablet (600 mg total) by mouth at bedtime. **dose change 01/03/24   Jolinda Norene M, DO  leflunomide  (ARAVA ) 20 MG tablet TAKE ONE (1) TABLET BY MOUTH EVERY DAY 07/05/24   Cheryl Waddell HERO, PA-C  losartan -hydrochlorothiazide  (HYZAAR) 100-12.5 MG tablet TAKE ONE (1) TABLET BY MOUTH EVERY DAY 01/03/24   Jolinda Norene M, DO  Multiple Vitamin (MULTIVITAMIN PO) Take 1 tablet by mouth daily.    [provider]  rosuvastatin  (CRESTOR ) 10 MG tablet Take 1 tablet (10 mg total) by mouth daily. 01/03/24   Jolinda Norene HERO, DO  Suvorexant  (BELSOMRA ) 10 MG TABS Take 10 mg by mouth at bedtime as needed (sleep). Place on hold 09/07/22   Jolinda Norene HERO, DO  UNABLE TO FIND Med Name:  CBD roll on, as needed    [provider]  VITAMIN E  PO Take by mouth daily.    [provider]    Allergies: Enbrel  [etanercept ]    Review of Systems  Neurological:  Positive for dizziness.    Updated Vital Signs BP 117/70 (BP Location: Left Arm)   Pulse (!) 101   Temp 98.6 F (37 C) (Oral)   Resp 17   Ht 1.6 m (5' 3)   Wt 94.3 kg   SpO2 99%   BMI 36.85 kg/m   Physical Exam Vitals and nursing note reviewed.  Constitutional:      General: She is not in acute distress.    Appearance: She is well-developed.  HENT:     Head: Normocephalic and atraumatic.      Right Ear: External ear normal.     Left Ear: External ear normal.  Eyes:     General: No scleral icterus.       Right eye: No discharge.        Left eye: No discharge.     Conjunctiva/sclera: Conjunctivae normal.  Neck:     Trachea: No tracheal deviation.  Cardiovascular:     Rate and Rhythm: Normal rate and regular rhythm.  Pulmonary:     Effort: Pulmonary effort is normal. No respiratory distress.     Breath sounds: Normal breath sounds. No stridor. No wheezing or rales.  Abdominal:     General: Bowel sounds are normal. There is no distension.     Palpations: Abdomen is soft.     Tenderness: There is no abdominal tenderness. There is no guarding or rebound.  Musculoskeletal:        General: No tenderness or deformity.     Cervical back: Neck supple.  Skin:    General: Skin is warm and dry.     Findings: No rash.  Neurological:     General: No focal deficit present.     Mental Status: She is alert.     Cranial Nerves: No cranial nerve deficit, dysarthria or facial asymmetry.     Sensory: No sensory deficit.     Motor: No weakness, abnormal muscle tone or seizure activity.     Coordination: Coordination normal.     Comments: Patient able lift both legs off the bed, equal grip strength bilaterally, normal sensation, no facial droop, no visual field cuts, no neglect  Psychiatric:        Mood and Affect: Mood normal.     (all labs ordered are listed, but only abnormal results are displayed) Labs Reviewed  COMPREHENSIVE METABOLIC PANEL WITH GFR - Abnormal; Notable for the following components:      Result Value   Potassium 3.4 (*)    Glucose, Bld 108 (*)    Albumin 2.6 (*)    All other components within normal limits  CBC - Abnormal; Notable for the following components:   WBC 3.8 (*)    Hemoglobin 11.2 (*)    HCT 35.2 (*)    RDW 16.5 (*)    All other components within normal limits  URINALYSIS, ROUTINE W REFLEX MICROSCOPIC  COMPREHENSIVE METABOLIC PANEL WITH GFR   CBC  VITAMIN B12  TROPONIN I (HIGH SENSITIVITY)  TROPONIN I (HIGH SENSITIVITY)    EKG: EKG Interpretation Date/Time:  Tuesday July 10 2024 17:46:36 EDT Ventricular Rate:  100 PR Interval:  138 QRS Duration:  82 QT Interval:  358 QTC Calculation: 461 R Axis:   39  Text Interpretation:  Normal sinus rhythm ST & T wave abnormality, consider inferior ischemia Abnormal ECG When compared with ECG of 11-May-2024 14:22, No significant change since last tracing Confirmed by Randol Simmonds [phone]) on 07/10/2024 10:54:54 PM  Radiology: XR Knee 1-2 Views Right Result Date: 07/10/2024 Well-seated prosthesis without complication  XR Pelvis 1-2 Views Result Date: 07/10/2024 X-rays demonstrate minimal joint space narrowing.  She does have peritubular osteophyte formation     Procedures   Medications Ordered in the ED  leflunomide  (ARAVA ) tablet 20 mg (has no administration in time range)  rosuvastatin  (CRESTOR ) tablet 10 mg (has no administration in time range)  apixaban  (ELIQUIS ) tablet 5 mg (has no administration in time range)  cyanocobalamin  (VITAMIN B12) tablet 1,000 mcg (has no administration in time range)  lactated ringers  infusion (has no administration in time range)  sodium chloride  flush (NS) 0.9 % injection 3 mL (has no administration in time range)  sodium chloride  flush (NS) 0.9 % injection 3 mL (has no administration in time range)  0.9 %  sodium chloride  infusion (has no administration in time range)  acetaminophen  (TYLENOL ) tablet 650 mg (has no administration in time range)    Or  acetaminophen  (TYLENOL ) suppository 650 mg (has no administration in time range)  ondansetron  (ZOFRAN ) tablet 4 mg (has no administration in time range)    Or  ondansetron  (ZOFRAN ) injection 4 mg (has no administration in time range)  docusate sodium  (COLACE) capsule 100 mg (has no administration in time range)  iohexol  (OMNIPAQUE ) 350 MG/ML injection 75 mL (has no administration in time range)   sodium chloride  0.9 % bolus 500 mL (0 mLs Intravenous Stopped 07/10/24 2316)  lactated ringers  bolus 500 mL (500 mLs Intravenous New Bag/Given 07/10/24 2315)    Clinical Course as of 07/10/24 2335  Tue Jul 10, 2024  2159 Comprehensive metabolic panel(!) No significant metabolic abnormalities [JK]  2200 CBC(!) Hemoglobin stable [JK]  2255 Case discussed with Dr Lee regarding admission [JK]    Clinical Course User Index [JK] Randol Simmonds, MD                                 Medical Decision Making Problems Addressed: Near syncope: acute illness or injury that poses a threat to life or bodily functions  Amount and/or Complexity of Data Reviewed Labs: ordered. Decision-making details documented in ED Course.  Risk Decision regarding hospitalization.   Patient presented to the ED for evaluation of syncopal episodes.  Patient is not sure that she ever completely lost consciousness.  Patient however did have a few spells while she was at the orthopedic doctor's office.  Patient is not having chest pain.  No shortness of breath.  She states she has been compliant with her anticoagulation.  Low suspicion for PE at this time.  Her CBC does not show any significant anemia.  No signs of severe dehydration.  Cardiac enzymes are unremarkable.  With her syncope near syncope we will plan on admission to the hospital for observation for further evaluation.  Patient is agreeable to this plan.     Final diagnoses:  Near syncope    ED Discharge Orders     None          Randol Simmonds, MD 07/10/24 2336

## 2024-07-10 NOTE — Progress Notes (Signed)
 Post-Op Visit Note   Patient: Jean Howell           Date of Birth: [birth year]           MRN: [id number] Visit Date: 07/10/2024 PCP: Jolinda Norene HERO, DO   Assessment & Plan:  Chief Complaint:  Chief Complaint  Patient presents with   Right Knee - Follow-up   Visit Diagnoses:  1. History of total right knee replacement   2. Pain in right hip     Plan:  Patient is a pleasant 68 year old female who comes in today 2 years status post right total knee replacement 07/05/2022.  She has recently started having pain to the right knee mostly at night.  She does tell me she is having pain in the right groin at times as well.  Examination of the right knee: Fully healed surgical scar without complication.  No effusion.  Range of motion 0-1 30 without pain.  She is stable valgus varus stress.  No joint line tenderness.  Right hip exam: She does have pain with logroll and FADIR testing.  She is neurovascular intact distally.  At this point, not concerned for right knee infection.  X-rays are unremarkable for loosening of the prosthesis.  I feel as though her right knee pain could likely be referred from her right hip joint.  We have discussed referral to Dr. Burnetta for ultrasound-guided cortisone injection.  She is not interested in injection.  We will plan to see her back in a year or so for follow-up of her right knee.  Should anything worsen in the meantime, she will let us  know.  Of note, upon her visit with me, she had 2 syncopal episodes followed by another syncopal episode while in x-ray after I saw her.  She denied any chest pain, pressure/palpitations, shortness of breath.  She does have a history of a PE and is on Eliquis .  After speaking with the patient, we elected to call EMT for the patient to be transported to the ED to be further evaluated.  Follow-Up Instructions: Return if symptoms worsen or fail to improve.   Orders:  Orders Placed This Encounter  Procedures   XR Knee 1-2  Views Right   XR Pelvis 1-2 Views   No orders of the defined types were placed in this encounter.   Imaging: Right knee x-ray: Well-seated prosthesis without complication Right hip x-ray: Minimal joint space narrowing.  Periarticular osteophyte formation  PMFS History: Patient Active Problem List   Diagnosis Date Noted   Healthcare maintenance 06/19/2024   DVT (deep venous thrombosis) (HCC) 05/12/2024   Pulmonary emboli (HCC) 05/11/2024   Mild aortic regurgitation 03/15/2024   HLD (hyperlipidemia) 12/27/2022   Other intervertebral disc degeneration, lumbar region 11/18/2022   Status post total right knee replacement 07/05/2022   Osteopenia of neck of right femur 03/16/2022   Impaired mobility and endurance 06/16/2020   Sciatica of left side 06/16/2020   Rheumatoid arthritis involving multiple sites with positive rheumatoid factor (HCC) 03/06/2019   Primary osteoarthritis of both knees 03/06/2019   Former smoker 03/06/2019   ANA positive 02/12/2019   Morbid obesity (HCC) 01/08/2019   Essential hypertension 01/08/2019   Past Medical History:  Diagnosis Date   Arthritis    Depression    Hypertension    Rheumatoid arthritis (HCC)     Family History  Problem Relation Age of Onset   Thyroid  disease Mother    CVA Mother 86   Cancer Father  Colon cancer Father    Thyroid  disease Sister    Congestive Heart Failure Sister    Hypertension Sister    Diabetes Sister    Hypertension Sister    Diabetes Sister    Hypertension Sister    Hypertension Sister    Cancer Brother        kidney    Healthy Son    Thyroid  disease Niece    Breast cancer Neg Hx     Past Surgical History:  Procedure Laterality Date   BREAST EXCISIONAL BIOPSY Bilateral    pt unsure when- benign   BREAST SURGERY     nodules removed   COLONOSCOPY N/A 06/25/2019   Procedure: COLONOSCOPY;  Surgeon: Harvey Margo CROME, MD;  Location: [facility]ITE;  Service: Endoscopy;  Laterality: N/A;  1:30   MOLE  REMOVAL Right 01/26/2022   foot (patient was put to sleep for procedure)   POLYPECTOMY  06/25/2019   Procedure: POLYPECTOMY;  Surgeon: Harvey Margo CROME, MD;  Location: [facility];  Service: Endoscopy;;   TOTAL KNEE ARTHROPLASTY Right 07/05/2022   Procedure: RIGHT TOTAL KNEE ARTHROPLASTY;  Surgeon: Jerri Kay HERO, MD;  Location: [facility] OR;  Service: Orthopedics;  Laterality: Right;   Social History   Occupational History   Occupation: retired  Tobacco Use   Smoking status: Former    Current packs/day: 0.00    Average packs/day: 1 pack/day for 15.0 years (15.0 ttl pk-yrs)    Types: Cigarettes    Start date: 19    Quit date: 2000    Years since quitting: 25.6    Passive exposure: Past   Smokeless tobacco: Never  Vaping Use   Vaping status: Never Used  Substance and Sexual Activity   Alcohol use: Not Currently   Drug use: Never   Sexual activity: Not Currently    Comment: widowed

## 2024-07-10 NOTE — H&P (Signed)
 History and Physical    Jean Howell FMW:[id number] DOB: [birth year] DOA: 07/10/2024  PCP: Jean Norene HERO, DO   Patient coming from: Home   Chief Complaint:  Chief Complaint  Patient presents with   Dizziness   ED TRIAGE note:Pt to er via ems, per ems pt is here for some dizziness and states that they picked her up at the ortho doc. States that she is orthostatic.   HPI:  Jean Howell is Howell 68 y.o. female with medical history significant of PE and DVT in 05/2024, chronic osteoarthritis, depression, anxiety, essential hypertension, hyperlipidemia, left lower extremity sciatica, primary osteoarthritis of the both knees, rheumatoid arthritis, right knee replacement, former smoker and morbid obesity scented to emergency department picked up from orthopedics clinic as patient was complaining about dizziness.  EMS found patient was orthostatic positive.  Patient reported that in the orthopedics clinic she was episode of lightheadedness and felt like she needed to sit down. Patient states she closed her eyes and said Howell prayer. She does not think she lost consciousness. Patient states she was then getting x-rays when she started having some back discomfort and felt like she needed to sit down. Patient closed her eyes again.  The physician assistant that was seeing her felt that patient was having syncopal/near syncopal episodes. She was sent to the ED for further evaluation. Patient herself was not sure that she ever completely lost consciousness. She states she feels fine now.  Patient denies any generalized weakness, focal weakness, tremor, tingling, numbness, syncope, presyncope, abdominal pain, chest pain, shortness of breath, palpitation, nausea, vomiting and diarrhea.   ED Course:  Presentation to ED patient found tachycardic heart rate 101 and borderline hypotensive blood pressure 117/70 otherwise hemodynamically stable. CMP showing low potassium 3.4 and low albumin 2.6 otherwise  unremarkable. CBC unremarkable stable H&H 11.2 and 35, slightly low WBC count 3.8 and normal platelet count. Troponin 6 within normal range.   EKG showed normal sinus rhythm heart rate 100, nonspecific ST abnormality lead II, III, aVF.  EKG findings are similar as compared to previous EKG in June 2025.  X-ray pelvis minimal joint space narrowing.  X-ray right 8 knee joint peritubular osteophyte formation.  In the ED patient was given total 1 L of LR bolus.  Hospitalist has been consulted for further evaluation and management of presyncopal episode and orthostatic hypotension.  Significant labs in the ED: Lab Orders         Comprehensive metabolic panel         CBC         Urinalysis, Routine w reflex microscopic -Urine, Clean Catch         Comprehensive metabolic panel         CBC       Review of Systems:  ROS  Past Medical History:  Diagnosis Date   Arthritis    Depression    Hypertension    Rheumatoid arthritis (HCC)     Past Surgical History:  Procedure Laterality Date   BREAST EXCISIONAL BIOPSY Bilateral    pt unsure when- benign   BREAST SURGERY     nodules removed   COLONOSCOPY N/Howell 06/25/2019   Procedure: COLONOSCOPY;  Surgeon: Jean Margo CROME, MD;  Location: [facility];  Service: Endoscopy;  Laterality: N/Howell;  1:30   MOLE REMOVAL Right 01/26/2022   foot (patient was put to sleep for procedure)   POLYPECTOMY  06/25/2019   Procedure: POLYPECTOMY;  Surgeon: Jean Margo CROME, MD;  Location: [facility]  ENDO SUITE;  Service: Endoscopy;;   TOTAL KNEE ARTHROPLASTY Right 07/05/2022   Procedure: RIGHT TOTAL KNEE ARTHROPLASTY;  Surgeon: Jean Kay HERO, MD;  Location: [facility] OR;  Service: Orthopedics;  Laterality: Right;     reports that she quit smoking about 25 years ago. Her smoking use included cigarettes. She started smoking about 40 years ago. She has Howell 15 pack-year smoking history. She has been exposed to tobacco smoke. She has never used smokeless tobacco. She reports that she  does not currently use alcohol. She reports that she does not use drugs.  Allergies  Allergen Reactions   Enbrel  [Etanercept ] Rash    Psoriasis    Family History  Problem Relation Age of Onset   Thyroid  disease Mother    CVA Mother 6   Cancer Father    Colon cancer Father    Thyroid  disease Sister    Congestive Heart Failure Sister    Hypertension Sister    Diabetes Sister    Hypertension Sister    Diabetes Sister    Hypertension Sister    Hypertension Sister    Cancer Brother        kidney    Healthy Son    Thyroid  disease Niece    Breast cancer Neg Hx     Prior to Admission medications   Medication Sig Start Date End Date Taking? Authorizing Provider  Abatacept  (ORENCIA  CLICKJECT) 125 MG/ML SOAJ Inject 125 mg into the skin once Howell week. 06/27/24   Jean Reiter, MD  acetaminophen  (TYLENOL ) 650 MG CR tablet Take 1,300 mg by mouth every 8 (eight) hours as needed for pain.    [provider]  apixaban  (ELIQUIS ) 5 MG TABS tablet Take 2 tablets (10 mg total) by mouth 2 (two) times daily for 7 days, THEN 1 tablet (5 mg total) 2 (two) times daily. 05/13/24 06/25/24  Shahmehdi, Seyed A, MD  betamethasone dipropionate (DIPROLENE) 0.05 % ointment Apply topically as needed.    [provider]  Calcium -Magnesium-Zinc (CAL-MAG-ZINC PO) Take 1 tablet by mouth daily.    [provider]  cyanocobalamin  (VITAMIN B12) 1000 MCG tablet Take 1 tablet by mouth daily. 12/16/21   [provider]  Ergocalciferol  (VITAMIN D2) 10 MCG (400 UNIT) TABS Take 1 tablet by mouth daily. 12/16/21   [provider]  fluticasone  (FLONASE ) 50 MCG/ACT nasal spray USE 2 SPRAYS IN EACH NOSTRIL DAILY Patient taking differently: Place 2 sprays into both nostrils as needed. 04/18/23   Jean Norene HERO, DO  gabapentin  (NEURONTIN ) 600 MG tablet Take 1 tablet (600 mg total) by mouth at bedtime. **dose change 01/03/24   Jean Norene M, DO  leflunomide  (ARAVA ) 20 MG tablet  TAKE ONE (1) TABLET BY MOUTH EVERY DAY 07/05/24   Jean Waddell HERO, PA-C  losartan -hydrochlorothiazide  (HYZAAR) 100-12.5 MG tablet TAKE ONE (1) TABLET BY MOUTH EVERY DAY 01/03/24   Jean Norene M, DO  Multiple Vitamin (MULTIVITAMIN PO) Take 1 tablet by mouth daily.    [provider]  rosuvastatin  (CRESTOR ) 10 MG tablet Take 1 tablet (10 mg total) by mouth daily. 01/03/24   Jean Norene HERO, DO  Suvorexant  (BELSOMRA ) 10 MG TABS Take 10 mg by mouth at bedtime as needed (sleep). Place on hold 09/07/22   Jean Norene HERO, DO  UNABLE TO FIND Med Name: CBD roll on, as needed    [provider]  VITAMIN E  PO Take by mouth daily.    [provider]     Physical Exam: Vitals:  07/10/24 1600 07/10/24 1601 07/10/24 2100  BP:  117/70   Pulse:  (!) 101   Resp:  17   Temp:  98.8 F (37.1 C) 98.6 F (37 C)  TempSrc:  Oral Oral  SpO2:  99%   Weight: 94.3 kg    Height: 5' 3 (1.6 m)      Physical Exam   Labs on Admission: I have personally reviewed following labs and imaging studies  CBC: Recent Labs  Lab 07/10/24 2109  WBC 3.8*  HGB 11.2*  HCT 35.2*  MCV 86.5  PLT 274   Basic Metabolic Panel: Recent Labs  Lab 07/10/24 2109  NA 137  K 3.4*  CL 100  CO2 27  GLUCOSE 108*  BUN 20  CREATININE 1.00  CALCIUM  9.2   GFR: Estimated Creatinine Clearance: 58 mL/min (by C-G formula based on SCr of 1 mg/dL). Liver Function Tests: Recent Labs  Lab 07/10/24 2109  AST 21  ALT 14  ALKPHOS 47  BILITOT 0.3  PROT 7.3  ALBUMIN 2.6*   No results for input(s): LIPASE, AMYLASE in the last 168 hours. No results for input(s): AMMONIA in the last 168 hours. Coagulation Profile: No results for input(s): INR, PROTIME in the last 168 hours. Cardiac Enzymes: Recent Labs  Lab 07/10/24 2109  TROPONINIHS 6   BNP (last 3 results) No results for input(s): BNP in the last 8760 hours. HbA1C: No results for input(s): HGBA1C in the last 72  hours. CBG: No results for input(s): GLUCAP in the last 168 hours. Lipid Profile: No results for input(s): CHOL, HDL, LDLCALC, TRIG, CHOLHDL, LDLDIRECT in the last 72 hours. Thyroid  Function Tests: No results for input(s): TSH, T4TOTAL, FREET4, T3FREE, THYROIDAB in the last 72 hours. Anemia Panel: No results for input(s): VITAMINB12, FOLATE, FERRITIN, TIBC, IRON, RETICCTPCT in the last 72 hours. Urine analysis:    Component Value Date/Time   COLORURINE YELLOW 02/26/2019 1040   APPEARANCEUR CLEAR 02/26/2019 1040   LABSPEC 1.025 02/26/2019 1040   PHURINE < OR = 5.0 02/26/2019 1040   GLUCOSEU NEGATIVE 02/26/2019 1040   HGBUR NEGATIVE 02/26/2019 1040   KETONESUR TRACE (Howell) 02/26/2019 1040   PROTEINUR NEGATIVE 02/26/2019 1040   NITRITE NEGATIVE 02/26/2019 1040   LEUKOCYTESUR 2+ (Howell) 02/26/2019 1040    Radiological Exams on Admission: I have personally reviewed images XR Knee 1-2 Views Right Result Date: 07/10/2024 Well-seated prosthesis without complication  XR Pelvis 1-2 Views Result Date: 07/10/2024 X-rays demonstrate minimal joint space narrowing.  She does have peritubular osteophyte formation   Assessment/Plan: Principal Problem:   Postural dizziness with presyncope Active Problems:   Orthostatic hypotension   Essential hypertension   Degenerative arthritis of knee, bilateral   History of total right knee replacement   HLD (hyperlipidemia)   History of pulmonary embolism   History of DVT (deep vein thrombosis)   Generalized anxiety disorder   Chronic sciatica    Assessment and Plan: Postural dizziness with presyncope -Patient has been referred from orthopedics clinic as she has 2 episodes of presyncopal episode, postural dizziness while in the orthopedics clinic.  EMS found patient is orthostatic positive.   -Patient denies any tingling, numbness, chest pain, palpitation, focal and generalized weakness.  Denies any actual syncopal  episode or loss of consciousness. Presentation to ED patient found hemodynamically stable except borderline hypotensive.  In the ED patient received total 1 L of NS and LR bolus.  CBC, CMP unremarkable.  Normal troponin.  EKG showing normal sinus rhythm nonspecific ST-T wave  abnormality in lead to 3 aVF which is unchanged as compared to previous EKG 05/25/2024 - Given patient is orthostatic positive holding home blood pressure regimen.  Starting maintenance fluid LR 100 cc/h. - Obtaining CT head to rule out any intracranial abnormality. -Obtaining CTA chest to follow-up with resolution of PE vs new development of PE. -Obtaining echocardiogram - Continue cardiac monitoring.   Orthostatic hypotension History of essential hypertension -Due to orthostatic hypotension and patient is borderline hypotensive holding losartan  and hydrochlorothiazide .  Once blood pressure will be stabilized will resume if indicated.  Currently on maintenance fluid.   History of DVT and PE 05/2024 -Patient reported missed 2 dose of Eliquis . - Obtaining repeat CTA chest to follow-up with PE. - Continue Eliquis  5 mg twice daily.   Chronic osteoarthritis of bilateral knee Right-sided knee joint replacement Sciatica -Continue Tylenol  as needed  Generalized anxiety disorder -At home patient is not any medications for anxiety.  Denies any anxiety and depression.  Rheumatoid arthritis -Continue leflunomide  20 mg daily   Hyperlipidemia -Continue Lipitor   DVT prophylaxis:  Eliquis  Code Status:  Full Code Diet: Heart healthy diet Family Communication:   Family was present at bedside, at the time of interview.  Opportunity was given to ask question and all questions were answered satisfactorily.  Disposition Plan: Need to follow-up with CT of the head and CTA chest. Consults: None indicated at this time Admission status:   Observation, Telemetry bed  Severity of Illness: The appropriate patient status for this  patient is OBSERVATION. Observation status is judged to be reasonable and necessary in order to provide the required intensity of service to ensure the patient's safety. The patient's presenting symptoms, physical exam findings, and initial radiographic and laboratory data in the context of their medical condition is felt to place them at decreased risk for further clinical deterioration. Furthermore, it is anticipated that the patient will be medically stable for discharge from the hospital within 2 midnights of admission.     Moneisha Vosler, MD [facility]  How to contact the [facility] Attending or Consulting provider 7A - 7P or covering provider during after hours 7P -7A, for this patient.  Check the care team in [facility] and look for Howell) attending/consulting [facility] provider listed and b) the [facility] team listed Log into [email or web address] and use [place]'s universal password to access. If you do not have the password, please contact the hospital operator. Locate the [facility] provider you are looking for under [facility] and page to Howell number that you can be directly reached. If you still have difficulty reaching the provider, please page the [facility] (Director on Call) for the Hospitalists listed on amion for assistance.  07/10/2024, 11:08 PM

## 2024-07-10 NOTE — ED Provider Triage Note (Signed)
 Emergency Medicine Provider Triage Evaluation Note  Jean Howell , a 68 y.o. female  was evaluated in triage.  Pt complains of dizziness.  Pt came from her ortho office where she had 2 syncopal events.  She was sent here for further eval. Review of Systems  Positive: Dizziness/syncope Negative: Cp/sob  Physical Exam  BP 117/70 (BP Location: Left Arm)   Pulse (!) 101   Temp 98.8 F (37.1 C) (Oral)   Resp 17   Ht 5' 3 (1.6 m)   Wt 94.3 kg   SpO2 99%   BMI 36.85 kg/m  Gen:   Awake, no distress   Resp:  Normal effort  MSK:   Moves extremities without difficulty  Other:    Medical Decision Making  Medically screening exam initiated at 8:10 PM.  Appropriate orders placed.  Jean Howell was informed that the remainder of the evaluation will be completed by another provider, this initial triage assessment does not replace that evaluation, and the importance of remaining in the ED until their evaluation is complete.  IVFs and lab orders placed.   Dean Clarity, MD 07/10/24 2011

## 2024-07-10 NOTE — ED Notes (Signed)
 Attempted to get blood. Attempt unsuccessful.

## 2024-07-10 NOTE — ED Triage Notes (Signed)
 Pt to er via ems, per ems pt is here for some dizziness and states that they picked her up at the ortho doc.  States that she is orthostatic.

## 2024-07-11 ENCOUNTER — Observation Stay (HOSPITAL_BASED_OUTPATIENT_CLINIC_OR_DEPARTMENT_OTHER)

## 2024-07-11 DIAGNOSIS — R55 Syncope and collapse: Secondary | ICD-10-CM

## 2024-07-11 DIAGNOSIS — R42 Dizziness and giddiness: Secondary | ICD-10-CM | POA: Diagnosis not present

## 2024-07-11 LAB — COMPREHENSIVE METABOLIC PANEL WITH GFR
ALT: 11 U/L (ref 0–44)
AST: 19 U/L (ref 15–41)
Albumin: 2.3 g/dL — ABNORMAL LOW (ref 3.5–5.0)
Alkaline Phosphatase: 42 U/L (ref 38–126)
Anion gap: 11 (ref 5–15)
BUN: 15 mg/dL (ref 8–23)
CO2: 23 mmol/L (ref 22–32)
Calcium: 8.4 mg/dL — ABNORMAL LOW (ref 8.9–10.3)
Chloride: 101 mmol/L (ref 98–111)
Creatinine, Ser: 0.88 mg/dL (ref 0.44–1.00)
GFR, Estimated: >60 mL/min (ref >60)
Glucose, Bld: 93 mg/dL (ref 70–99)
Potassium: 3.5 mmol/L (ref 3.5–5.1)
Sodium: 135 mmol/L (ref 135–145)
Total Bilirubin: 0.6 mg/dL (ref 0.0–1.2)
Total Protein: 6.2 g/dL — ABNORMAL LOW (ref 6.5–8.1)

## 2024-07-11 LAB — CBC
HCT: 30.9 % — ABNORMAL LOW (ref 36.0–46.0)
Hemoglobin: 9.9 g/dL — ABNORMAL LOW (ref 12.0–15.0)
MCH: 27.3 pg (ref 26.0–34.0)
MCHC: 32 g/dL (ref 30.0–36.0)
MCV: 85.1 fL (ref 80.0–100.0)
Platelets: 240 K/uL (ref 150–400)
RBC: 3.63 MIL/uL — ABNORMAL LOW (ref 3.87–5.11)
RDW: 16.6 % — ABNORMAL HIGH (ref 11.5–15.5)
WBC: 3.2 K/uL — ABNORMAL LOW (ref 4.0–10.5)
nRBC: 0 % (ref 0.0–0.2)

## 2024-07-11 LAB — ECHOCARDIOGRAM LIMITED
AR max vel: 3.44 cm2
AV Peak grad: 5.5 mmHg
Ao pk vel: 1.17 m/s
Area-P 1/2: 3.21 cm2
Height: 63 in
S' Lateral: 3.3 cm
Weight: 3328 [oz_av]

## 2024-07-11 MED ORDER — LOSARTAN POTASSIUM 50 MG PO TABS: 50.0000 mg | ORAL_TABLET | Freq: Every day | ORAL | 0 refills | Status: DC

## 2024-07-11 NOTE — ED Notes (Signed)
 Patient was complaining of nausea after breakfast so meds given

## 2024-07-11 NOTE — Discharge Summary (Signed)
 Physician Discharge Summary   Patient: Jean Howell MRN: [id number] DOB: [birth year]  Admit date:     07/10/2024  Discharge date: 07/11/24  Discharge Physician: Yetta Blanch  PCP: Jolinda Norene HERO, DO  Recommendations at discharge: Follow-up with PCP in 1 week to discuss blood pressure medication.   Follow-up Information     Jolinda Norene M, DO. Schedule an appointment as soon as possible for a visit in 1 week(s).   Specialty: Family Medicine Why: To discuss blood pressure meds Contact information: [address] [place] [address] [phone]         [facility]  Follow up.   Why: [facility] will call you in the next 24-48 hours to set up services. Contact information: [address] [place] [address] [phone]                Discharge Diagnoses: Principal Problem:   Postural dizziness with presyncope Active Problems:   Orthostatic hypotension   Essential hypertension   Degenerative arthritis of knee, bilateral   History of total right knee replacement   HLD (hyperlipidemia)   History of pulmonary embolism   History of DVT (deep vein thrombosis)   Generalized anxiety disorder   Chronic sciatica  Hospital Course: Postural dizziness with presyncope Presented to the hospital with complaints of near syncope. Orthostatic vitals were normal. Received IV fluid. Improvement in orthostatic vitals with ambulatory improved movement with physical therapy. Home health recommended. Telemetry negative.  EKG negative for any.  Echocardiogram negative for any acute abnormality. CT head as well as CT chest PE protocol negative for PE. Patient is on losartan /HCTZ will reduce it to losartan  only.    Orthostatic hypotension History of essential hypertension Home regimen includes losartan  100 HCTZ. Given normal blood pressure after holding this medication, we will reduce losartan  to 50 mg and hold HCTZ on discharge.    Lung nodule The CTA chest showed 4 mm right upper lobe pulmonary nodule.  Given patient has history of smoking in the past need to follow-up with chest CT every 12 months.   History of DVT and PE 05/2024 Patient reported missed 2 dose of Eliquis . CTA chest negative for any PE. Continue Eliquis  5 mg twice daily.   Chronic osteoarthritis of bilateral knee Right-sided knee joint replacement Sciatica Continue Tylenol  as needed   Generalized anxiety disorder At home patient is not any medications for anxiety.  Denies any anxiety and depression.   Rheumatoid arthritis Continue leflunomide  20 mg daily   Hyperlipidemia Continue Lipitor  Consultants:  none  Procedures performed:  Echocardiogram   DISCHARGE MEDICATION: Allergies as of 07/11/2024       Reactions   Enbrel  [etanercept ] Rash   Psoriasis        Medication List     STOP taking these medications    losartan -hydrochlorothiazide  100-12.5 MG tablet Commonly known as: HYZAAR       TAKE these medications    acetaminophen  650 MG CR tablet Commonly known as: TYLENOL  Take 1,300 mg by mouth every 8 (eight) hours as needed for pain.   apixaban  5 MG Tabs tablet Commonly known as: ELIQUIS  Take 2 tablets (10 mg total) by mouth 2 (two) times daily for 7 days, THEN 1 tablet (5 mg total) 2 (two) times daily. Start taking on: May 13, 2024   Belsomra  10 MG Tabs Generic drug: Suvorexant  Take 10 mg by mouth at bedtime as needed (sleep). Place on hold   betamethasone dipropionate 0.05 %  ointment Commonly known as: DIPROLENE Apply topically as needed.   CAL-MAG-ZINC PO Take 1 tablet by mouth daily.   cyanocobalamin  1000 MCG tablet Commonly known as: VITAMIN B12 Take 1 tablet by mouth daily.   fluticasone  50 MCG/ACT nasal spray Commonly known as: FLONASE  USE 2 SPRAYS IN EACH NOSTRIL DAILY What changed:  when to take this reasons to take this   gabapentin  600 MG tablet Commonly known as: Neurontin  Take 1  tablet (600 mg total) by mouth at bedtime. **dose change   leflunomide  20 MG tablet Commonly known as: ARAVA  TAKE ONE (1) TABLET BY MOUTH EVERY DAY   losartan  50 MG tablet Commonly known as: Cozaar  Take 1 tablet (50 mg total) by mouth daily. Start taking on: July 12, 2024   MULTIVITAMIN PO Take 1 tablet by mouth daily.   Orencia  ClickJect 125 MG/ML Soaj Generic drug: Abatacept  Inject 125 mg into the skin once a week.   rosuvastatin  10 MG tablet Commonly known as: Crestor  Take 1 tablet (10 mg total) by mouth daily.   UNABLE TO FIND Med Name: CBD roll on, as needed   Vitamin D2 10 MCG (400 UNIT) Tabs Take 1 tablet by mouth daily.   VITAMIN E  PO Take 1 capsule by mouth daily.       Disposition: Home Diet recommendation: Cardiac diet  Discharge Exam: Vitals:   07/11/24 0700 07/11/24 0847 07/11/24 0900 07/11/24 0946  BP:   115/74 131/67  Pulse:   80 80  Resp:   16   Temp: 98.4 F (36.9 C) 98.4 F (36.9 C)  (!) 97.3 F (36.3 C)  TempSrc:  Oral    SpO2:   100% 100%  Weight:      Height:       Clear to auscultation. S1-S2 present. No focal deficit. Alert awake and oriented x 3.  Filed Weights   07/10/24 1600  Weight: 94.3 kg   Condition at discharge: stable  The results of significant diagnostics from this hospitalization (including imaging, microbiology, ancillary and laboratory) are listed below for reference.   Imaging Studies: ECHOCARDIOGRAM LIMITED Result Date: 07/11/2024    ECHOCARDIOGRAM LIMITED REPORT   Patient Name:   Jean Howell Date of Exam: 07/11/2024 Medical Rec #:  [id number]      Height:       63.0 in Accession #:    [id number]     Weight:       208.0 lb Date of Birth:  05-25-1956       BSA:          1.966 m Patient Age:    67 years       BP:           110/57 mmHg Patient Gender: F              HR:           85 bpm. Exam Location:  Inpatient Procedure: Limited Echo and Limited Color Doppler (Both Spectral and Color Flow            Doppler were  utilized during procedure). Indications:    Syncope R55  History:        Patient has prior history of Echocardiogram examinations, most                 recent 05/13/2024. Signs/Symptoms:Hypotension; Risk                 Factors:Hypertension, Former Smoker and Dyslipidemia.  Sonographer:    Thea  Harmon RCS Referring Phys: SUBRINA SUNDIL IMPRESSIONS  1. Left ventricular ejection fraction, by estimation, is 55 to 60%. The left ventricle has normal function. There is mild left ventricular hypertrophy. Left ventricular diastolic parameters were normal.  2. Right ventricular systolic function is normal. The right ventricular size is normal. Tricuspid regurgitation signal is inadequate for assessing PA pressure.  3. The mitral valve is grossly normal. Trivial mitral valve regurgitation.  4. The aortic valve is tricuspid. Aortic valve regurgitation is trivial. No aortic stenosis is present.  5. The inferior vena cava is normal in size with greater than 50% respiratory variability, suggesting right atrial pressure of 3 mmHg. FINDINGS  Left Ventricle: Left ventricular ejection fraction, by estimation, is 55 to 60%. The left ventricle has normal function. There is mild left ventricular hypertrophy. Left ventricular diastolic parameters were normal. Right Ventricle: The right ventricular size is normal. Right ventricular systolic function is normal. Tricuspid regurgitation signal is inadequate for assessing PA pressure. Pericardium: Presence of epicardial fat layer. Mitral Valve: The mitral valve is grossly normal. Trivial mitral valve regurgitation. Aortic Valve: The aortic valve is tricuspid. Aortic valve regurgitation is trivial. No aortic stenosis is present. Aortic valve peak gradient measures 5.5 mmHg. Aorta: The aortic root is normal in size and structure. Venous: The inferior vena cava is normal in size with greater than 50% respiratory variability, suggesting right atrial pressure of 3 mmHg. LEFT VENTRICLE PLAX 2D  LVIDd:         4.70 cm   Diastology LVIDs:         3.30 cm   LV e' medial:    9.25 cm/s LV PW:         1.30 cm   LV E/e' medial:  5.5 LV IVS:        1.10 cm   LV e' lateral:   12.90 cm/s LVOT diam:     2.30 cm   LV E/e' lateral: 3.9 LV SV:         69 LV SV Index:   35 LVOT Area:     4.15 cm  RIGHT VENTRICLE             IVC RV S prime:     15.40 cm/s  IVC diam: 1.20 cm TAPSE (M-mode): 2.1 cm LEFT ATRIUM         Index LA diam:    3.00 cm 1.53 cm/m  AORTIC VALVE AV Area (Vmax): 3.44 cm AV Vmax:        117.00 cm/s AV Peak Grad:   5.5 mmHg LVOT Vmax:      96.90 cm/s LVOT Vmean:     62.400 cm/s LVOT VTI:       0.166 m  AORTA Ao Root diam: 3.60 cm Ao Asc diam:  3.60 cm MITRAL VALVE MV Area (PHT): 3.21 cm    SHUNTS MV Decel Time: 236 msec    Systemic VTI:  0.17 m MV E velocity: 50.80 cm/s  Systemic Diam: 2.30 cm MV A velocity: 70.20 cm/s MV E/A ratio:  0.72 Soyla Merck MD Electronically signed by Soyla Merck MD Signature Date/Time: 07/11/2024/10:54:18 AM    Final    CT Angio Chest Pulmonary Embolism (PE) W or WO Contrast Result Date: 07/10/2024 EXAM: CTA of the Chest with contrast for PE 07/10/2024 11:35:08 PM TECHNIQUE: CTA of the chest was performed after the administration of intravenous contrast, with and without IV contrast. Multiplanar reformatted images are provided for review. MIP images are provided for review. Automated  exposure control, iterative reconstruction, and/or weight based adjustment of the mA/kV was utilized to reduce the radiation dose to as low as reasonably achievable. 75mL of iohexol  (OMNIPAQUE ) 350 MG/ML injection was used. COMPARISON: Comparison with CT on 05/11/2024. CLINICAL HISTORY: Patient has multiple presyncopal episodes. Need to follow-up with CTA chest for resolution of previous PE versus any new expansion. FINDINGS: PULMONARY ARTERIES: Pulmonary arteries are adequately opacified for evaluation. No pulmonary embolism. Main pulmonary artery is normal in caliber. MEDIASTINUM: The  heart and pericardium demonstrate no acute abnormality. There is no acute abnormality of the thoracic aorta. LYMPH NODES: No mediastinal, hilar or axillary lymphadenopathy. LUNGS AND PLEURA: The lungs show bibasilar subpleural reticulation and honeycombing consistent with fibrosis. A calcified granuloma is present in the left upper lobe. A 4 mm pulmonary nodule is seen in the right upper lobe on series 6, image 37. No focal pneumonia, pleural effusion, or pneumothorax is present. UPPER ABDOMEN: Limited images of the upper abdomen are unremarkable. SOFT TISSUES AND BONES: No acute bone or soft tissue abnormality. IMPRESSION: 1. No pulmonary embolism. 2. 4 mm right upper lobe pulmonary nodule. According to the Fleischner Society pulmonary nodule recommendations, this finding is consistent with a single solid nodule <6 mm, and the recommendation is: no routine follow-up required for low-risk patients; optional CT at 12 months for high-risk patients (particularly with suspicious nodule morphology and/or upper lobe location). 3. Bibasilar subpleural reticulation and honeycombing consistent with fibrosis. Electronically signed by: Norman Gatlin MD 07/10/2024 11:46 PM EDT RP Workstation: [id number]   CT HEAD WO CONTRAST ( ) Result Date: 07/10/2024 CLINICAL DATA:  Presyncope/syncope EXAM: CT HEAD WITHOUT CONTRAST TECHNIQUE: Contiguous axial images were obtained from the base of the skull through the vertex without intravenous contrast. RADIATION DOSE REDUCTION: This exam was performed according to the departmental dose-optimization program which includes automated exposure control, adjustment of the mA and/or kV according to patient size and/or use of iterative reconstruction technique. COMPARISON:  None Available. FINDINGS: Brain: No evidence of acute infarction, hemorrhage, hydrocephalus, extra-axial collection or mass lesion/mass effect. Patchy white matter hypodensities, compatible with chronic microvascular  ischemic disease. Small remote right cerebellar infarct. Vascular: No hyperdense vessel. Skull: No acute fracture. Sinuses/Orbits: Clear sinuses.  No acute orbital findings. Other: No mastoid effusions IMPRESSION: No evidence of acute intracranial abnormality. Electronically Signed   By: Gilmore GORMAN Molt M.D.   On: 07/10/2024 23:43   XR Knee 1-2 Views Right Result Date: 07/10/2024 Well-seated prosthesis without complication  XR Pelvis 1-2 Views Result Date: 07/10/2024 X-rays demonstrate minimal joint space narrowing.  She does have peritubular osteophyte formation    Microbiology: Results for orders placed or performed during the hospital encounter of 07/02/22  Surgical pcr screen     Status: None   Collection Time: 07/02/22  2:20 PM   Specimen: Nasal Mucosa; Nasal Swab  Result Value Ref Range Status   MRSA, PCR NEGATIVE NEGATIVE Final   Staphylococcus aureus NEGATIVE NEGATIVE Final    Comment: (NOTE) The Xpert SA Assay (FDA approved for NASAL specimens in patients 75 years of age and older), is one component of a comprehensive surveillance program. It is not intended to diagnose infection nor to guide or monitor treatment. Performed at [facility], [address], [place], [place] [address]    Labs: CBC: Recent Labs  Lab 07/10/24 2109 07/11/24 0542  WBC 3.8* 3.2*  HGB 11.2* 9.9*  HCT 35.2* 30.9*  MCV 86.5 85.1  PLT 274 240   Basic Metabolic Panel: Recent Labs  Lab 07/10/24 2109 07/11/24 0542  NA 137 135  K 3.4* 3.5  CL 100 101  CO2 27 23  GLUCOSE 108* 93  BUN 20 15  CREATININE 1.00 0.88  CALCIUM  9.2 8.4*   Liver Function Tests: Recent Labs  Lab 07/10/24 2109 07/11/24 0542  AST 21 19  ALT 14 11  ALKPHOS 47 42  BILITOT 0.3 0.6  PROT 7.3 6.2*  ALBUMIN 2.6* 2.3*   CBG: No results for input(s): GLUCAP in the last 168 hours.  Discharge time spent: greater than 30 minutes.  Author: Yetta Blanch, MD  [facility]

## 2024-07-11 NOTE — Progress Notes (Addendum)
 Transition of Care [facility]) - Inpatient Brief Assessment   Patient Details  Name: Jean Howell MRN: [id number] Date of Birth: 06-28-1956  Transition of Care [facility]) CM/SW Contact:    Rosaline JONELLE Joe, RN Phone Number: 07/11/2024, 1:30 PM   Clinical Narrative: CM with IP Care management met with the patient prior to discharge to offer Medicare choice regarding home health services and patient did not have a preference.  I called [facility] and they are unable to accept due to staffing.  I called [facility] and spoke with Chalese, RNCM with [facility] and she is checking the [place] branch regarding staffing.  DME at the home includes rolator.  Patient plans to call family to provide transportation assistance to home.  07/11/2024 1505- [facility] called back and they are unable to provide home health services.  I called and left a message with [facility] regarding home health availability.  07/11/24 1510 -[facility] called and states that they are unable to provide home health services.  I spoke with the patient and updated her that I was unable to find home health PT in her area.  I offered OP PT services referral but patient declined.  Patient states that her family plans to pick her up by car for home today and plans to follow up with her PCP for hospital follow up.   Transition of Care Asessment: Insurance and Status: (P) Insurance coverage has been reviewed Patient has primary care physician: (P) Yes Home environment has been reviewed: (P) from home alone Prior level of function:: (P) Rolator Prior/Current Home Services: (P) No current home services Social Drivers of Health Review: (P) SDOH reviewed interventions complete Readmission risk has been reviewed: (P) Yes Transition of care needs: (P) transition of care needs identified, TOC will continue to follow

## 2024-07-11 NOTE — ED Notes (Signed)
 Called multiple times to notified floor that patient is coming up and unable to reach anyone.

## 2024-07-11 NOTE — Progress Notes (Signed)
 Echocardiogram 2D Echocardiogram has been performed.  Jean Howell 07/11/2024, 8:12 AM

## 2024-07-11 NOTE — Care Management Obs Status (Signed)
 MEDICARE OBSERVATION STATUS NOTIFICATION   Patient Details  Name: Jean Howell MRN: [id number] Date of Birth: 01-Nov-1956   Medicare Observation Status Notification Given:  Yes Obs letter signed and copy given   Claretta Deed 07/11/2024, 12:42 PM

## 2024-07-11 NOTE — Plan of Care (Signed)

## 2024-07-11 NOTE — ED Notes (Signed)
 ECHO at bedside.  Patient ambulated to bathroom with walker without difficulty prior to echo

## 2024-07-11 NOTE — Evaluation (Signed)
 Physical Therapy Evaluation Patient Details Name: Jean Howell MRN: [id number] DOB: [birth year] Today's Date: 07/11/2024  History of Present Illness  Patient is a 68 y/o female admitted 07/10/24 due to dizziness and found to be orthostatic at orthopedic office.  PMH positive for PE and DVT in June this year, chronic OA, RA, depression, HTN, HLD, LE sciatica and obesity.  Clinical Impression  Patient presents with decreased mobility due to pain, weakness and some pre-syncopal symptoms.  States managing at home though does not stand to cook, walk far and has stopped showering due to fall in shower when she passed out.  She did demonstrate orthostatic hypotension though improved while standing.  She lives alone and has intermittent help from her niece and son.  She has a life alert and is managing.  Feel safe for home though educated to sit when feeling hot and need for follow up HHPT for which she is in agreement.  Will defer further PT to home setting.  Orthostatic VS for the past 24 hrs (Last 3 readings):  BP- Lying Pulse- Lying BP- Sitting Pulse- Sitting BP- Standing at 0 minutes Pulse- Standing at 0 minutes BP- Standing at 3 minutes Pulse- Standing at 3 minutes  07/11/24 1200 141/68 83 152/76 83 122/83 91 139/76 92           If plan is discharge home, recommend the following:     Can travel by private vehicle        Equipment Recommendations None recommended by PT  Recommendations for Other Services       Functional Status Assessment Patient has had a recent decline in their functional status and demonstrates the ability to make significant improvements in function in a reasonable and predictable amount of time.     Precautions / Restrictions Precautions Precautions: Fall Precaution/Restrictions Comments: watch BP      Mobility  Bed Mobility Overal bed mobility: Needs Assistance Bed Mobility: Supine to Sit     Supine to sit: Supervision     General bed mobility comments:  elevated HOB increased time    Transfers Overall transfer level: Needs assistance Equipment used: Rollator (4 wheels) Transfers: Sit to/from Stand Sit to Stand: Supervision           General transfer comment: increased time, heavy UE support to stand, stood from toilet in bathroom unaided with grabbar, uncontrolled decent into recliner when sitting, educated slow controlled sitting safer and less stress on her back and using legs to strengthen knees    Ambulation/Gait Ambulation/Gait assistance: Supervision Gait Distance (Feet): 75 Feet Assistive device: Rollator (4 wheels) Gait Pattern/deviations: Step-through pattern, Decreased stride length, Wide base of support, Trunk flexed       General Gait Details: limited by pain in her back and getting hot increased speed getting back to chair due to feeling hot educated need to sit on rollator when feeling weak/hot to avoid passing out  Stairs            Wheelchair Mobility     Tilt Bed    Modified Rankin (Stroke Patients Only)       Balance                                             Pertinent Vitals/Pain Pain Assessment Pain Assessment: 0-10 Pain Score: 3  Pain Location: knees and back Pain Descriptors / Indicators: Aching  Pain Intervention(s): Monitored during session    Home Living Family/patient expects to be discharged to:: Private residence Living Arrangements: Alone   Type of Home: House Home Access: Stairs to enter Entrance Stairs-Rails: Can reach both Entrance Stairs-Number of Steps: 5   Home Layout: One level Home Equipment: Shower seat;Grab bars - tub/shower;Rollator (4 wheels);Lift chair;Rolling Walker (2 wheels) Additional Comments: life alert watch    Prior Function Prior Level of Function : Independent/Modified Independent             Mobility Comments: denies falls in past 6 months, had a fall in shower when passed out and has not showered since (sponge  bathes); limited standing and walking tolerance due to back pain ADLs Comments: neice helps with grocery shopping, son helps her with hair once to twice a week     Extremity/Trunk Assessment   Upper Extremity Assessment Upper Extremity Assessment: RUE deficits/detail;LUE deficits/detail RUE Deficits / Details: bilat hand weakness with arthritis, R shoulder elevation limited and painful (about 30-40 degrees) LUE Deficits / Details: hand weakness due to arthritis; shoulder elevation to about 100    Lower Extremity Assessment Lower Extremity Assessment: LLE deficits/detail;RLE deficits/detail RLE Deficits / Details: AROM WFL, strength hip flexion 3+/10 knee extension 4-/5, ankle DF 5/5 LLE Deficits / Details: AROM WFL, strength hip flexion 4-/10 knee extension 4/5, ankle DF 5/5    Cervical / Trunk Assessment Cervical / Trunk Assessment: Lordotic  Communication   Communication Communication: No apparent difficulties    Cognition Arousal: Alert Behavior During Therapy: WFL for tasks assessed/performed   PT - Cognitive impairments: No apparent impairments                         Following commands: Intact       Cueing       General Comments      Exercises     Assessment/Plan    PT Assessment All further PT needs can be met in the next venue of care  PT Problem List Decreased activity tolerance;Decreased strength;Decreased mobility;Decreased balance       PT Treatment Interventions      PT Goals (Current goals can be found in the Care Plan section)  Acute Rehab PT Goals PT Goal Formulation: All assessment and education complete, DC therapy    Frequency       Co-evaluation               AM-PAC PT 6 Clicks Mobility  Outcome Measure Help needed turning from your back to your side while in a flat bed without using bedrails?: None Help needed moving from lying on your back to sitting on the side of a flat bed without using bedrails?: None Help  needed moving to and from a bed to a chair (including a wheelchair)?: None Help needed standing up from a chair using your arms (e.g., wheelchair or bedside chair)?: None Help needed to walk in hospital room?: None Help needed climbing 3-5 steps with a railing? : A Little 6 Click Score: 23    End of Session Equipment Utilized During Treatment: Gait belt Activity Tolerance: Patient limited by fatigue Patient left: in chair;with call bell/phone within reach   PT Visit Diagnosis: Other abnormalities of gait and mobility (R26.89);Muscle weakness (generalized) (M62.81)    Time: 8856-8782 PT Time Calculation (min) (ACUTE ONLY): 34 min   Charges:   PT Evaluation $PT Eval Low Complexity: 1 Low PT Treatments $Gait Training: 8-22 mins PT General  Charges $$ ACUTE PT VISIT: 1 Visit         Micheline Portal, PT Acute Rehabilitation Services Office:[phone] 07/11/2024   Montie Portal 07/11/2024, 12:33 PM

## 2024-07-12 ENCOUNTER — Telehealth: Payer: Self-pay

## 2024-07-12 NOTE — Transitions of Care (Post Inpatient/ED Visit) (Unsigned)
 07/12/2024  Name: Jean Howell MRN: [id number] DOB: 1956-10-16  Today's TOC FU Call Status: Today's TOC FU Call Status:: Successful TOC FU Call Completed TOC FU Call Complete Date: 07/12/24 Patient's Name and Date of Birth confirmed.  Transition Care Management Follow-up Telephone Call Date of Discharge: 07/11/24 Discharge Facility: [facility]) Type of Discharge: Inpatient Admission Primary Inpatient Discharge Diagnosis:: syncope How have you been since you were released from the hospital?: Better Any questions or concerns?: No  Items Reviewed: Did you receive and understand the discharge instructions provided?: Yes Medications obtained,verified, and reconciled?: Yes (Medications Reviewed) Any new allergies since your discharge?: No Dietary orders reviewed?: Yes Do you have support at home?: Yes People in Home [RPT]: child(ren), adult  Medications Reviewed Today: Medications Reviewed Today     Reviewed by Emmitt Pan, LPN (Licensed Practical Nurse) on 07/12/24 at 1446  Med List Status: <None>   Medication Order Taking? Sig Documenting Provider Last Dose Status Informant  Abatacept  (ORENCIA  CLICKJECT) 125 MG/ML SOAJ [id number] Yes Inject 125 mg into the skin once a week. Dolphus Reiter, MD  Active Self, Pharmacy Records  acetaminophen  (TYLENOL ) 650 MG CR tablet [id number] Yes Take 1,300 mg by mouth every 8 (eight) hours as needed for pain. [provider]  Active Self, Pharmacy Records  apixaban  (ELIQUIS ) 5 MG TABS tablet [id number] Yes Take 2 tablets (10 mg total) by mouth 2 (two) times daily for 7 days, THEN 1 tablet (5 mg total) 2 (two) times daily. Willette Adriana LABOR, MD  Active Self, Pharmacy Records  betamethasone dipropionate (DIPROLENE) 0.05 % ointment [id number] Yes Apply topically as needed. [provider]  Active Self, Pharmacy Records  Calcium -Magnesium-Zinc (CAL-MAG-ZINC PO) [id number] Yes Take 1 tablet by mouth daily. [provider]  Active Self, Pharmacy Records  cyanocobalamin  (VITAMIN B12) 1000 MCG tablet [id number]  Take 1 tablet by mouth daily.  Patient not taking: Reported on 07/12/2024   [provider]  Active Self, Pharmacy Records  Ergocalciferol  (VITAMIN D2) 10 MCG (400 UNIT) TABS [id number] Yes Take 1 tablet by mouth daily. [provider]  Active Self, Pharmacy Records  fluticasone  (FLONASE ) 50 MCG/ACT nasal spray [id number] Yes USE 2 SPRAYS IN EACH NOSTRIL DAILY  Patient taking differently: Place 2 sprays into both nostrils as needed.   Jolinda Norene HERO, DO  Active Self, Pharmacy Records  gabapentin  (NEURONTIN ) 600 MG tablet [id number] Yes Take 1 tablet (600 mg total) by mouth at bedtime. **dose change Jolinda Norene HERO, DO  Active Self, Pharmacy Records  leflunomide  (ARAVA ) 20 MG tablet [id number] Yes TAKE ONE (1) TABLET BY MOUTH EVERY DAY Cheryl Waddell HERO, PA-C  Active Self, Pharmacy Records  losartan  (COZAAR ) 50 MG tablet [id number] Yes Take 1 tablet (50 mg total) by mouth daily. Tobie Yetta HERO, MD  Active   Multiple Vitamin (MULTIVITAMIN PO) [id number] Yes Take 1 tablet by mouth daily. [provider]  Active Self, Pharmacy Records  rosuvastatin  (CRESTOR ) 10 MG tablet [id number] Yes Take 1 tablet (10 mg total) by mouth daily. Jolinda Norene HERO, DO  Active Self, Pharmacy Records  Suvorexant  (BELSOMRA ) 10 MG TABS [id number] Yes Take 10 mg by mouth at bedtime as needed (sleep). Place on hold Jolinda Norene HERO, DO  Active Self, Pharmacy Records  UNABLE TO LARA [id number] Yes Med Name: CBD roll on, as needed [provider]  Active Self, Pharmacy Records  VITAMIN E  PO [id number] Yes Take 1 capsule by mouth daily. [provider]  Active Self, Pharmacy Records  Home Care and Equipment/Supplies: Were Home Health Services Ordered?: NA Any new equipment or medical supplies ordered?: NA  Functional Questionnaire: Do you need assistance with  bathing/showering or dressing?: Yes Do you need assistance with meal preparation?: No Do you need assistance with eating?: No Do you have difficulty maintaining continence: No Do you need assistance with getting out of bed/getting out of a chair/moving?: No Do you have difficulty managing or taking your medications?: No  Follow up appointments reviewed: PCP Follow-up appointment confirmed?: No (no avail appts, sent message to staff to schedule) MD Provider Line [phone] Given: No Specialist Hospital Follow-up appointment confirmed?: NA Do you need transportation to your follow-up appointment?: No Do you understand care options if your condition(s) worsen?: Yes-patient verbalized understanding    SIGNATURE Julian Lemmings, LPN [facility] Nurse Health Advisor Direct Dial [phone]

## 2024-07-13 ENCOUNTER — Other Ambulatory Visit: Payer: Self-pay

## 2024-07-13 NOTE — Patient Instructions (Addendum)
 Visit Information  Thank you for taking time to visit with me today. Please don't hesitate to contact me if I can be of assistance to you before our next scheduled appointment.  Our next appointment is by telephone on 07/19/24 at 11am Please call the care guide team at [phone] if you need to cancel or reschedule your appointment.   Following is a copy of your care plan:   Goals Addressed             This Visit's Progress    BSW VBCI Social Work Care Plan       Problems:   Geophysicist/field seismologist , Transportation, and Personal Care  CSW Clinical Goal(s):   Over the next 7 days the Patient will await follow upcall from [facility] for In [employer] and reapply for Foodstamps.  Interventions:  Social Determinants of Health in Patient with Anxiety and HTN: SDOH assessments completed: Food Insecurity , Transportation, and Personal Care Evaluation of current treatment plan related to unmet needs Patient has a food Radiographer, therapeutic and Ucard for food, but need to recertify for foodstamps.  Patient receives transportation with her insurance.  SW t/c [facility] but they do not provider Personal Care.  SW t/c [facility] to request a call back to have patient on the waiting list for In Home Aide.  Patient can not afford out of pocket cost for In Home Aide.  Patient Goals/Self-Care Activities:  Patient will re-apply for Foodstamps and await follow up from [facility] for In Home Aide.  Plan:   Telephone follow up appointment with care management team member scheduled for:  07/19/24 at 11am.        Please call 911 if you are experiencing a Mental Health or Behavioral Health Crisis or need someone to talk to.  Patient verbalizes understanding of instructions and care plan provided today and agrees to view in MyChart. Active MyChart status and patient understanding of how to access instructions and care plan via MyChart confirmed with patient.     Tillman Gardener, BSW [place]  [facility], [facility] Social Worker Direct Dial: [phone]  Fax: [phone] Website: [email or web address]

## 2024-07-13 NOTE — Telephone Encounter (Signed)
 Hey, Jean Howell.  Is there a designated scheduling pool that these are supposed to go to?  They keep coming to my box for scheduling for some reason.  Can you please help [name] get an appt for this patient?

## 2024-07-13 NOTE — Patient Outreach (Signed)
 Complex Care Management   Visit Note  07/13/2024  Name:  Jean Howell MRN: [id number] DOB: 12-27-1955  Situation: Referral received for Complex Care Management related to SDOH Barriers:  Transportation Food insecurity Personal Care I obtained verbal consent from Patient.  Visit completed with patient  on the phone  Background:   Past Medical History:  Diagnosis Date   Arthritis    Depression    Hypertension    Rheumatoid arthritis (HCC)     Assessment:  Patient reports she has transportation with her insurance but wanted transportation for other things.  Family is currently assisting with transportation for errands.  Patient has a food Radiographer, therapeutic and Ucard for food, but need to recertify for foodstamps. Patient needs extra help with housekeeping and bathing.  SW t/c [facility] but they do not provider Personal Care. SW t/c [facility] to request a call back to have patient on the waiting list for In Home Aide. Patient can not afford out of pocket cost for In Home Aide.   SDOH Interventions    Flowsheet Row Patient Outreach Telephone from 07/13/2024 in [place] POPULATION HEALTH DEPARTMENT Patient Outreach Telephone from 07/06/2024 in [place] POPULATION HEALTH DEPARTMENT Clinical Support from 06/25/2024 in [place] [facility] Office Visit from 06/18/2024 in [facility] [place] Telephone from 05/16/2024 in [place] POPULATION HEALTH DEPARTMENT Clinical Support from 06/23/2023 in [place] [facility]  SDOH Interventions        Food Insecurity Interventions Intervention Not Indicated  [Ucard] Community Resources Provided, [employer] Resources Referral Intervention Not Indicated Intervention Not Indicated Other (Comment)  [Patient is on wait list for [employer] and receives $23 monthly in SNAP benefits. Emailed Environmental manager for [employer] [place].] Intervention Not Indicated  Housing  Interventions -- Intervention Not Indicated Intervention Not Indicated Intervention Not Indicated -- Intervention Not Indicated  Transportation Interventions -- -- Intervention Not Indicated Intervention Not Indicated -- Intervention Not Indicated  Utilities Interventions -- -- Intervention Not Indicated Intervention Not Indicated -- Intervention Not Indicated  Alcohol Usage Interventions -- -- -- -- -- Intervention Not Indicated (Score <7)  Depression Interventions/Treatment  -- -- EYV7-0 Score <4 Follow-up Not Indicated Patient refuses Treatment -- PHQ2-9 Score <4 Follow-up Not Indicated  Financial Strain Interventions Intervention Not Indicated -- Intervention Not Indicated -- -- Intervention Not Indicated  Physical Activity Interventions -- -- Intervention Not Indicated -- -- --  Stress Interventions -- -- Intervention Not Indicated -- -- Intervention Not Indicated  Social Connections Interventions -- -- -- -- -- Patient Declined  Health Literacy Interventions -- -- Intervention Not Indicated -- -- Intervention Not Indicated      Recommendation:   None  Follow Up Plan:   Telephone follow up appointment date/time:  07/19/24 at 11am  Tillman Gardener, BSW [place]  [facility], [facility] Social Worker Direct Dial: [phone]  Fax: [phone] Website: [email or web address]

## 2024-07-18 ENCOUNTER — Other Ambulatory Visit: Payer: Self-pay | Admitting: *Deleted

## 2024-07-18 NOTE — Patient Instructions (Signed)
 Visit Information  Thank you for taking time to visit with me today. Please don't hesitate to contact me if I can be of assistance to you before our next scheduled appointment.  Our next appointment is by telephone on 08-01-2024 at 1:00 pm Please call the care guide team at [phone] if you need to cancel or reschedule your appointment.   Following is a copy of your care plan:   Goals Addressed             This Visit's Progress    VBCI RN Care Plan: HTN       Problems:  Chronic Disease Management support and education needs related to HTN  Goal: Over the next 90 days the Patient will attend all scheduled medical appointments: with primary care provider and specialist as evidenced by keeping all scheduled appointments        continue to work with RN Care Manager and/or Social Worker to address care management and care coordination needs related to HTN as evidenced by adherence to care management team scheduled appointments     take all medications exactly as prescribed and will call provider for medication related questions as evidenced by compliance with all medications    verbalize basic understanding of HTN disease process and self health management plan as evidenced by verbal explanation, recognizing/monitoring symptoms, lifestyle modifications  Interventions:   Hypertension Interventions: Last practice recorded BP readings:  BP Readings from Last 3 Encounters:  07/11/24 131/67  06/25/24 138/83  06/18/24 138/83   Most recent eGFR/CrCl:  Lab Results  Component Value Date   EGFR 67 07/02/2024    No components found for: CRCL  Evaluation of current treatment plan related to hypertension self management and patient's adherence to plan as established by provider Provided education to patient re: stroke prevention, s/s of heart attack and stroke Reviewed medications with patient and discussed importance of compliance Counseled on adverse effects of illicit drug and  excessive alcohol use in patients with high blood pressure  Counseled on the importance of exercise goals with target of 150 minutes per week Discussed plans with patient for ongoing care management follow up and provided patient with direct contact information for care management team Advised patient, providing education and rationale, to monitor blood pressure daily and record, calling PCP for findings outside established parameters Reviewed scheduled/upcoming provider appointments including:  Advised patient to discuss acute changes or elevated home BP readings with provider Provided education on prescribed diet DASH Discussed complications of poorly controlled blood pressure such as heart disease, stroke, circulatory complications, vision complications, kidney impairment, sexual dysfunction Screening for signs and symptoms of depression related to chronic disease state  Assessed social determinant of health barriers  Patient Self-Care Activities:  Attend all scheduled provider appointments Call pharmacy for medication refills 3-7 days in advance of running out of medications Call provider office for new concerns or questions  Take medications as prescribed   Work with the social worker to address care coordination needs and will continue to work with the clinical team to address health care and disease management related needs check blood pressure daily write blood pressure results in a log or diary take blood pressure log to all doctor appointments call doctor for signs and symptoms of high blood pressure take medications for blood pressure exactly as prescribed report new symptoms to your doctor eat more whole grains, fruits and vegetables, lean meats and healthy fats  Plan:  Telephone follow up appointment with care management team member scheduled  for:  08-01-2024 at 1:00 pm             Please call the Suicide and Crisis Lifeline: 988 call the [place]  National Suicide  Prevention Lifeline: (573)473-2707 or TTY: 780 700 2362 TTY 671-314-6118) to talk to a trained counselor call 1-800-273-TALK (toll free, 24 hour hotline) call the [facility]: 901-362-0497 call 911 if you are experiencing a Mental Health or Behavioral Health Crisis or need someone to talk to.  Patient verbalizes understanding of instructions and care plan provided today and agrees to view in MyChart. Active MyChart status and patient understanding of how to access instructions and care plan via MyChart confirmed with patient.     Rosina Forte, BSN RN [facility], [facility] RN Care Manager Direct Dial: [phone]  Fax: [phone]

## 2024-07-18 NOTE — Patient Outreach (Signed)
 Complex Care Management   Visit Note  07/18/2024  Name:  Jean Howell MRN: [id number] DOB: 1956-01-05  Situation: Referral received for Complex Care Management related to HTN I obtained verbal consent from Patient.  Visit completed with patient  on the phone  Background:   Past Medical History:  Diagnosis Date   Arthritis    Depression    Hypertension    Rheumatoid arthritis (HCC)     Assessment: Patient Reported Symptoms:  Cognitive Cognitive Status: No symptoms reported Cognitive/Intellectual Conditions Management [RPT]: None reported or documented in medical history or problem list   Health Maintenance Behaviors: Annual physical exam Healing Pattern: Average Health Facilitated by: Prayer/meditation, Rest  Neurological Neurological Review of Symptoms: Weakness Neurological Management Strategies: Routine screening Neurological Self-Management Outcome: 2 (bad)  HEENT HEENT Symptoms Reported: Tinnitus HEENT Self-Management Outcome: 3 (uncertain)    Cardiovascular Cardiovascular Symptoms Reported: No symptoms reported Does patient have uncontrolled Hypertension?: No Cardiovascular Management Strategies: Routine screening, Medication therapy  Respiratory Respiratory Symptoms Reported: No symptoms reported    Endocrine Endocrine Symptoms Reported: No symptoms reported Is patient diabetic?: No    Gastrointestinal Gastrointestinal Symptoms Reported: No symptoms reported      Genitourinary Genitourinary Symptoms Reported: No symptoms reported    Integumentary Integumentary Symptoms Reported: Rash Additional Integumentary Details: rash to bilateral arms and chest. Reports it came up after having EKG pads on her arm and chest Skin Management Strategies: Routine screening  Musculoskeletal Musculoskelatal Symptoms Reviewed: Difficulty walking, Limited mobility Musculoskeletal Management Strategies: Medical device Musculoskeletal Comment: uses walker/cane Falls in the past  year?: No Number of falls in past year: 1 or less Was there an injury with Fall?: No Fall Risk Category Calculator: 0 Patient Fall Risk Level: Low Fall Risk Patient at Risk for Falls Due to: No Fall Risks Fall risk Follow up: Falls evaluation completed  Psychosocial Psychosocial Symptoms Reported: No symptoms reported Behavioral Management Strategies: Coping strategies Behavioral Health Self-Management Outcome: 4 (good) Major Change/Loss/Stressor/Fears (CP): Denies Techniques to Cope with Loss/Stress/Change: Not applicable Quality of Family Relationships: helpful Do you feel physically threatened by others?: No      07/18/2024   10:27 AM  Depression screen PHQ 2/9  Decreased Interest 3  Down, Depressed, Hopeless 0  PHQ - 2 Score 3  Altered sleeping 2  Tired, decreased energy 3  Change in appetite 1  Feeling bad or failure about yourself  0  Trouble concentrating 0  Moving slowly or fidgety/restless 0  Suicidal thoughts 0  PHQ-9 Score 9  Difficult doing work/chores Somewhat difficult    There were no vitals filed for this visit.  Medications Reviewed Today     Reviewed by Bertrum Rosina HERO, RN (Registered Nurse) on 07/18/24 at 1017  Med List Status: <None>   Medication Order Taking? Sig Documenting Provider Last Dose Status Informant  Abatacept  (ORENCIA  CLICKJECT) 125 MG/ML SOAJ [id number] Yes Inject 125 mg into the skin once a week. Dolphus Reiter, MD  Active Self, Pharmacy Records  acetaminophen  (TYLENOL ) 650 MG CR tablet [id number] Yes Take 1,300 mg by mouth every 8 (eight) hours as needed for pain. [provider]  Active Self, Pharmacy Records  apixaban  (ELIQUIS ) 5 MG TABS tablet [id number] Yes Take 2 tablets (10 mg total) by mouth 2 (two) times daily for 7 days, THEN 1 tablet (5 mg total) 2 (two) times daily. Willette Adriana LABOR, MD  Active Self, Pharmacy Records  betamethasone dipropionate (DIPROLENE) 0.05 % ointment [id number] Yes Apply topically as needed.  [provider]  Active Self, Pharmacy Records  Calcium -Magnesium-Zinc (CAL-MAG-ZINC PO) [id number] Yes Take 1 tablet by mouth daily. [provider]  Active Self, Pharmacy Records  cyanocobalamin  (VITAMIN B12) 1000 MCG tablet [id number]  Take 1 tablet by mouth daily.  Patient not taking: Reported on 07/18/2024   [provider]  Consider Medication Status and Discontinue Self, Pharmacy Records  Ergocalciferol  (VITAMIN D2) 10 MCG (400 UNIT) TABS [id number] Yes Take 1 tablet by mouth daily. [provider]  Active Self, Pharmacy Records  fluticasone  (FLONASE ) 50 MCG/ACT nasal spray [id number] Yes USE 2 SPRAYS IN EACH NOSTRIL DAILY  Patient taking differently: Place 2 sprays into both nostrils daily as needed.   Jolinda Norene HERO, DO  Active Self, Pharmacy Records  gabapentin  (NEURONTIN ) 600 MG tablet [id number] Yes Take 1 tablet (600 mg total) by mouth at bedtime. **dose change Jolinda Norene HERO, DO  Active Self, Pharmacy Records  leflunomide  (ARAVA ) 20 MG tablet [id number] Yes TAKE ONE (1) TABLET BY MOUTH EVERY DAY Cheryl Waddell HERO, PA-C  Active Self, Pharmacy Records  losartan  (COZAAR ) 50 MG tablet [id number] Yes Take 1 tablet (50 mg total) by mouth daily. Tobie Yetta HERO, MD  Active   Multiple Vitamin (MULTIVITAMIN PO) [id number] Yes Take 1 tablet by mouth daily. [provider]  Active Self, Pharmacy Records  rosuvastatin  (CRESTOR ) 10 MG tablet [id number] Yes Take 1 tablet (10 mg total) by mouth daily. Jolinda Norene HERO, DO  Active Self, Pharmacy Records  Suvorexant  (BELSOMRA ) 10 MG TABS [id number]  Take 10 mg by mouth at bedtime as needed (sleep). Place on hold  Patient not taking: Reported on 07/18/2024   Jolinda Norene HERO, DO  Active Self, Pharmacy Records  UNABLE TO FIND [id number] Yes Med Name: CBD roll on, as needed [provider]  Active Self, Pharmacy Records  VITAMIN E  PO [id number] Yes Take 1 capsule by mouth daily. [provider]  Active Self, Pharmacy Records            Recommendation:   Continue Current Plan of Care  Follow Up Plan:   Telephone follow-up in 2 weeks  Rosina Forte, BSN RN [facility], [facility] RN Care Manager Direct Dial: [phone]  Fax: [phone]

## 2024-07-19 ENCOUNTER — Telehealth: Payer: Self-pay

## 2024-07-19 ENCOUNTER — Ambulatory Visit: Admitting: Family Medicine

## 2024-07-19 ENCOUNTER — Encounter: Payer: Self-pay | Admitting: Family Medicine

## 2024-07-19 VITALS — BP 130/82 | HR 102 | Temp 97.9°F | Ht 63.0 in | Wt 206.8 lb

## 2024-07-19 DIAGNOSIS — R202 Paresthesia of skin: Secondary | ICD-10-CM

## 2024-07-19 DIAGNOSIS — R911 Solitary pulmonary nodule: Secondary | ICD-10-CM

## 2024-07-19 DIAGNOSIS — R55 Syncope and collapse: Secondary | ICD-10-CM | POA: Diagnosis not present

## 2024-07-19 DIAGNOSIS — L2489 Irritant contact dermatitis due to other agents: Secondary | ICD-10-CM | POA: Diagnosis not present

## 2024-07-19 DIAGNOSIS — R42 Dizziness and giddiness: Secondary | ICD-10-CM | POA: Diagnosis not present

## 2024-07-19 DIAGNOSIS — D649 Anemia, unspecified: Secondary | ICD-10-CM | POA: Diagnosis not present

## 2024-07-19 NOTE — Progress Notes (Signed)
 Subjective:  Patient ID: Jean Howell, female    DOB: August 09, 1956, 68 y.o.   MRN: [id number]  Patient Care Team: Jolinda Norene HERO, DO as PCP - General (Family Medicine) Stacia Diannah SQUIBB, MD as PCP - Cardiology (Cardiology) Shaaron Lamar HERO, MD as Consulting Physician (Gastroenterology) Dolphus Reiter, MD as Consulting Physician (Rheumatology) Shona Rush, MD (Dermatology) Ladora Ross Lacy Phebe, MD as Referring Physician (Optometry) Jerri Kay HERO, MD as Attending Physician (Orthopedic Surgery) Merlynn Lyle LITTIE KEN as [facility] Care Management (Licensed Clinical Social Worker) Carolynn, Tillman KATHEE georgann DESIREE Care Management Bertrum Rosina HERO, RN as [facility] Care Management   Chief Complaint:  Transitions Of Care   HPI: Jean Howell is a 68 y.o. female presenting on 07/19/2024 for Transitions Of Care  Jean Howell is a 68 year old female with hypertension and a history of pulmonary embolism who presents for follow-up after hospitalization for posterior hypertension and presyncope. She was admitted to [facility] from 07/10/2024-07/11/2024.  She was recently hospitalized for posterior hypertension and presyncope. Since discharge, she has experienced no further episodes of dizziness, weakness, confusion, or syncope. Her blood pressure medication was adjusted during her hospital stay, reducing her losartan  dose to 50 mg. She has not been monitoring her blood pressure at home but notes that her blood pressure was 130/82 mmHg at today's visit.  She reports numbness in her fingers and foot, which has been present for some time but was not previously mentioned to healthcare providers. Her hemoglobin and hematocrit levels were noted to be low, with a hemoglobin of 9 g/dL and hematocrit of 69.0%. No black tarry stools and she is not currently taking any iron  supplements.  She has a history of pulmonary embolism and is currently on Eliquis  twice daily. A recent CT scan during her hospital stay showed no evidence  of PE but did reveal small lung nodules. She has a history of smoking, having quit 40 years ago, and no family history of lung cancer. She reports bruising from an attempted IV insertion by paramedics.  She experiences difficulty with cooking due to arthritis in her hands, which affects her ability to use heavy cookware like cast iron  pans. She is unsure of which iron -rich foods to include in her diet. Her niece advised her to eat more iron -rich foods instead of taking supplements.  She was advised to wear compression stockings, but she has difficulty putting them on. She has ordered a larger size but has not yet been able to try them on.       Hospital discharge summary: Discharge Diagnoses: Principal Problem:   Postural dizziness with presyncope Active Problems:   Orthostatic hypotension   Essential hypertension   Degenerative arthritis of knee, bilateral   History of total right knee replacement   HLD (hyperlipidemia)   History of pulmonary embolism   History of DVT (deep vein thrombosis)   Generalized anxiety disorder   Chronic sciatica   Hospital Course: Postural dizziness with presyncope Presented to the hospital with complaints of near syncope. Orthostatic vitals were normal. Received IV fluid. Improvement in orthostatic vitals with ambulatory improved movement with physical therapy. Home health recommended. Telemetry negative.  EKG negative for any.  Echocardiogram negative for any acute abnormality. CT head as well as CT chest PE protocol negative for PE. Patient is on losartan /HCTZ will reduce it to losartan  only.    Orthostatic hypotension History of essential hypertension Home regimen includes losartan  100 HCTZ. Given normal blood pressure after holding this medication, we  will reduce losartan  to 50 mg and hold HCTZ on discharge.   Lung nodule The CTA chest showed 4 mm right upper lobe pulmonary nodule.  Given patient has history of smoking in the past need to  follow-up with chest CT every 12 months.   History of DVT and PE 05/2024 Patient reported missed 2 dose of Eliquis . CTA chest negative for any PE. Continue Eliquis  5 mg twice daily.   Chronic osteoarthritis of bilateral knee Right-sided knee joint replacement Sciatica Continue Tylenol  as needed   Generalized anxiety disorder At home patient is not any medications for anxiety.  Denies any anxiety and depression.   Rheumatoid arthritis Continue leflunomide  20 mg daily   Hyperlipidemia Continue Lipitor   Relevant past medical, surgical, family, and social history reviewed and updated as indicated.  Allergies and medications reviewed and updated. Data reviewed: Chart in Epic.   Past Medical History:  Diagnosis Date   Arthritis    Depression    Hypertension    Rheumatoid arthritis (HCC)     Past Surgical History:  Procedure Laterality Date   BREAST EXCISIONAL BIOPSY Bilateral    pt unsure when- benign   BREAST SURGERY     nodules removed   COLONOSCOPY N/A 06/25/2019   Procedure: COLONOSCOPY;  Surgeon: Harvey Margo CROME, MD;  Location: [facility];  Service: Endoscopy;  Laterality: N/A;  1:30   MOLE REMOVAL Right 01/26/2022   foot (patient was put to sleep for procedure)   POLYPECTOMY  06/25/2019   Procedure: POLYPECTOMY;  Surgeon: Harvey Margo CROME, MD;  Location: [facility];  Service: Endoscopy;;   TOTAL KNEE ARTHROPLASTY Right 07/05/2022   Procedure: RIGHT TOTAL KNEE ARTHROPLASTY;  Surgeon: Jerri Kay HERO, MD;  Location: [facility] OR;  Service: Orthopedics;  Laterality: Right;    Social History   Socioeconomic History   Marital status: Widowed    Spouse name: Not on file   Number of children: 1   Years of education: 12th   Highest education level: GED or equivalent  Occupational History   Occupation: retired  Tobacco Use   Smoking status: Former    Current packs/day: 0.00    Average packs/day: 1 pack/day for 15.0 years (15.0 ttl pk-yrs)    Types: Cigarettes     Start date: 65    Quit date: 2000    Years since quitting: 25.6    Passive exposure: Past   Smokeless tobacco: Never  Vaping Use   Vaping status: Never Used  Substance and Sexual Activity   Alcohol use: Not Currently   Drug use: Never   Sexual activity: Not Currently    Comment: widowed  Other Topics Concern   Not on file  Social History Narrative   Patient is a widow that resides independently in [place].  She has 1 son, who resides in [place].   Social Drivers of Corporate investment banker Strain: Low Risk  (07/13/2024)   Overall Financial Resource Strain (CARDIA)    Difficulty of Paying Living Expenses: Not very hard  Recent Concern: Financial Resource Strain - Medium Risk (05/16/2024)   Overall Financial Resource Strain (CARDIA)    Difficulty of Paying Living Expenses: Somewhat hard  Food Insecurity: No Food Insecurity (07/18/2024)   Hunger Vital Sign    Worried About Running Out of Food in the Last Year: Never true    Ran Out of Food in the Last Year: Never true  Recent Concern: Food Insecurity - Food Insecurity Present (07/11/2024)  Hunger Vital Sign    Worried About Running Out of Food in the Last Year: Sometimes true    Ran Out of Food in the Last Year: Never true  Transportation Needs: No Transportation Needs (07/18/2024)   PRAPARE - Administrator, Civil Service (Medical): No    Lack of Transportation (Non-Medical): No  Physical Activity: Insufficiently Active (06/25/2024)   Exercise Vital Sign    Days of Exercise per Week: 7 days    Minutes of Exercise per Session: 10 min  Stress: Stress Concern Present (06/25/2024)   [employer] of Occupational Health - Occupational Stress Questionnaire    Feeling of Stress: Very much  Social Connections: Socially Isolated (07/11/2024)   Social Connection and Isolation Panel    Frequency of Communication with Friends and Family: Never    Frequency of Social Gatherings with Friends and Family: Never     Attends Religious Services: Never    Database administrator or Organizations: Not on file    Attends Banker Meetings: Never    Marital Status: Widowed  Intimate Partner Violence: Not At Risk (07/18/2024)   Humiliation, Afraid, Rape, and Kick questionnaire    Fear of Current or Ex-Partner: No    Emotionally Abused: No    Physically Abused: No    Sexually Abused: No    Outpatient Encounter Medications as of 07/19/2024  Medication Sig   Abatacept  (ORENCIA  CLICKJECT) 125 MG/ML SOAJ Inject 125 mg into the skin once a week.   acetaminophen  (TYLENOL ) 650 MG CR tablet Take 1,300 mg by mouth every 8 (eight) hours as needed for pain.   apixaban  (ELIQUIS ) 5 MG TABS tablet Take 2 tablets (10 mg total) by mouth 2 (two) times daily for 7 days, THEN 1 tablet (5 mg total) 2 (two) times daily.   betamethasone dipropionate (DIPROLENE) 0.05 % ointment Apply topically as needed.   Calcium -Magnesium-Zinc (CAL-MAG-ZINC PO) Take 1 tablet by mouth daily.   Ergocalciferol  (VITAMIN D2) 10 MCG (400 UNIT) TABS Take 1 tablet by mouth daily.   fluticasone  (FLONASE ) 50 MCG/ACT nasal spray USE 2 SPRAYS IN EACH NOSTRIL DAILY   gabapentin  (NEURONTIN ) 600 MG tablet Take 1 tablet (600 mg total) by mouth at bedtime. **dose change   leflunomide  (ARAVA ) 20 MG tablet TAKE ONE (1) TABLET BY MOUTH EVERY DAY   losartan  (COZAAR ) 50 MG tablet Take 1 tablet (50 mg total) by mouth daily.   Multiple Vitamin (MULTIVITAMIN PO) Take 1 tablet by mouth daily.   rosuvastatin  (CRESTOR ) 10 MG tablet Take 1 tablet (10 mg total) by mouth daily.   Suvorexant  (BELSOMRA ) 10 MG TABS Take 10 mg by mouth at bedtime as needed (sleep). Place on hold   UNABLE TO FIND Med Name: CBD roll on, as needed   VITAMIN E  PO Take 1 capsule by mouth daily.   cyanocobalamin  (VITAMIN B12) 1000 MCG tablet Take 1 tablet by mouth daily. (Patient not taking: Reported on 07/19/2024)   No facility-administered encounter medications on file as of 07/19/2024.     Allergies  Allergen Reactions   Electrodes 25mm [Tens Therapy Replace Back Pads] Rash    EKG electrodes   Enbrel  [Etanercept ] Rash    Psoriasis    Pertinent ROS per HPI, otherwise unremarkable      Objective:  BP 130/82   Pulse (!) 102   Temp 97.9 F (36.6 C) (Temporal)   Ht 5' 3 (1.6 m)   Wt 206 lb 12.8 oz (93.8 kg)   SpO2  97%   BMI 36.63 kg/m    Wt Readings from Last 3 Encounters:  07/19/24 206 lb 12.8 oz (93.8 kg)  07/10/24 208 lb (94.3 kg)  06/25/24 208 lb (94.3 kg)    Physical Exam Vitals and nursing note reviewed.  Constitutional:      General: She is not in acute distress.    Appearance: Normal appearance. She is obese. She is ill-appearing (chronically). She is not toxic-appearing or diaphoretic.  HENT:     Head: Normocephalic and atraumatic.  Cardiovascular:     Rate and Rhythm: Normal rate and regular rhythm.     Heart sounds: Normal heart sounds.  Musculoskeletal:     Right lower leg: Edema present.     Left lower leg: Edema present.  Skin:    General: Skin is warm and dry.     Capillary Refill: Capillary refill takes less than 2 seconds.     Findings: Erythema and rash present.      Neurological:     Mental Status: She is alert.  Psychiatric:        Mood and Affect: Mood normal.        Behavior: Behavior normal.        Thought Content: Thought content normal.        Judgment: Judgment normal.    Physical Exam   VITALS: BP- 130/82 SKIN: Bruise on skin. Skin lesions not severe. Venous stasis dermatitis with discoloration.        Results for orders placed or performed during the hospital encounter of 07/10/24  Comprehensive metabolic panel   Collection Time: 07/10/24  9:09 PM  Result Value Ref Range   Sodium 137 135 - 145 mmol/L   Potassium 3.4 (L) 3.5 - 5.1 mmol/L   Chloride 100 98 - 111 mmol/L   CO2 27 22 - 32 mmol/L   Glucose, Bld 108 (H) 70 - 99 mg/dL   BUN 20 8 - 23 mg/dL   Creatinine, Ser 8.99 0.44 - 1.00 mg/dL   Calcium   9.2 8.9 - 10.3 mg/dL   Total Protein 7.3 6.5 - 8.1 g/dL   Albumin 2.6 (L) 3.5 - 5.0 g/dL   AST 21 15 - 41 U/L   ALT 14 0 - 44 U/L   Alkaline Phosphatase 47 38 - 126 U/L   Total Bilirubin 0.3 0.0 - 1.2 mg/dL   GFR, Estimated >39 >39 mL/min   Anion gap 10 5 - 15  CBC   Collection Time: 07/10/24  9:09 PM  Result Value Ref Range   WBC 3.8 (L) 4.0 - 10.5 K/uL   RBC 4.07 3.87 - 5.11 MIL/uL   Hemoglobin 11.2 (L) 12.0 - 15.0 g/dL   HCT 64.7 (L) 63.9 - 53.9 %   MCV 86.5 80.0 - 100.0 fL   MCH 27.5 26.0 - 34.0 pg   MCHC 31.8 30.0 - 36.0 g/dL   RDW 83.4 (H) 88.4 - 84.4 %   Platelets 274 150 - 400 K/uL   nRBC 0.0 0.0 - 0.2 %  Troponin I (High Sensitivity)   Collection Time: 07/10/24  9:09 PM  Result Value Ref Range   Troponin I (High Sensitivity) 6 <18 ng/L  Troponin I (High Sensitivity)   Collection Time: 07/10/24 10:12 PM  Result Value Ref Range   Troponin I (High Sensitivity) 6 <18 ng/L  Comprehensive metabolic panel   Collection Time: 07/11/24  5:42 AM  Result Value Ref Range   Sodium 135 135 - 145 mmol/L   Potassium  3.5 3.5 - 5.1 mmol/L   Chloride 101 98 - 111 mmol/L   CO2 23 22 - 32 mmol/L   Glucose, Bld 93 70 - 99 mg/dL   BUN 15 8 - 23 mg/dL   Creatinine, Ser 9.11 0.44 - 1.00 mg/dL   Calcium  8.4 (L) 8.9 - 10.3 mg/dL   Total Protein 6.2 (L) 6.5 - 8.1 g/dL   Albumin 2.3 (L) 3.5 - 5.0 g/dL   AST 19 15 - 41 U/L   ALT 11 0 - 44 U/L   Alkaline Phosphatase 42 38 - 126 U/L   Total Bilirubin 0.6 0.0 - 1.2 mg/dL   GFR, Estimated >39 >39 mL/min   Anion gap 11 5 - 15  CBC   Collection Time: 07/11/24  5:42 AM  Result Value Ref Range   WBC 3.2 (L) 4.0 - 10.5 K/uL   RBC 3.63 (L) 3.87 - 5.11 MIL/uL   Hemoglobin 9.9 (L) 12.0 - 15.0 g/dL   HCT 69.0 (L) 63.9 - 53.9 %   MCV 85.1 80.0 - 100.0 fL   MCH 27.3 26.0 - 34.0 pg   MCHC 32.0 30.0 - 36.0 g/dL   RDW 83.3 (H) 88.4 - 84.4 %   Platelets 240 150 - 400 K/uL   nRBC 0.0 0.0 - 0.2 %  ECHOCARDIOGRAM LIMITED   Collection Time:  07/11/24  8:07 AM  Result Value Ref Range   Weight 3,328 oz   Height 63 in   BP 110/57 mmHg   S' Lateral 3.30 cm   AR max vel 3.44 cm2   AV Peak grad 5.5 mmHg   Ao pk vel 1.17 m/s   Area-P 1/2 3.21 cm2   Est EF 55 - 60%        Pertinent labs & imaging results that were available during my care of the patient were reviewed by me and considered in my medical decision making.  Assessment & Plan:  Jean Howell was seen today for transitions of care.  Diagnoses and all orders for this visit:  Postural dizziness with presyncope -     Anemia Profile B -     CMP14+EGFR  Paresthesia of both hands -     Anemia Profile B -     CMP14+EGFR  Paresthesia of foot, bilateral -     Anemia Profile B -     CMP14+EGFR  Anemia, unspecified type -     Anemia Profile B -     Fecal occult blood, imunochemical; Future  Irritant contact dermatitis due to other agents -     Anemia Profile B  Lung nodule Due to smoking history, recommended to repeat imaging in 12 months.       Hypertension with recent medication adjustment and presyncope Hypertension previously uncontrolled, leading to presyncope. Medication adjusted to losartan  50 mg, reducing from a higher dose and removing hydrochlorothiazide . Blood pressure currently well-controlled at 130/82 mmHg. No further episodes of dizziness or presyncope since medication adjustment. - Continue losartan  50 mg daily - Monitor blood pressure regularly at home - Follow up with primary care for blood pressure management  Anemia, evaluating for etiology Anemia with hemoglobin at 9 g/dL and hematocrit at 69.0%. Possible causes include nutritional deficiencies or bleeding, especially given anticoagulation with Eliquis . No overt signs of bleeding such as black tarry stools. Considering nutritional deficiencies such as iron , B12, or ferritin deficiency. - Order lab tests for hemoglobin, hematocrit, ferritin, and B12 levels - Provide stool card to check for  occult blood -  Discuss dietary sources of iron , including red meats, green leafy vegetables, nuts, and fortified cereals  Peripheral neuropathy, evaluating for nutritional deficiency Numbness and tingling in hands and feet, possibly related to nutritional deficiencies such as B12 or ferritin deficiency. No recent changes in symptoms. - Order lab tests for B12 and ferritin levels  Pulmonary embolism on anticoagulation Pulmonary embolism in June, currently on Eliquis  twice daily. Recent CTPA showed no evidence of PE. No recent provoking factors for PE identified. Continues to have bruising, possibly related to anticoagulation. - Continue Eliquis  twice daily - Monitor for signs of bleeding or bruising - Evaluate for any provoking factors for PE  Venous stasis dermatitis Discoloration and dermatitis on lower extremities, likely due to venous stasis. Symptoms improved with cortisone cream. - Recommend CeraVe or Cetaphil cream for skin hydration - Consider compression stockings for venous support if swelling occurs  Contact dermatitis of chest Contact dermatitis on chest, likely secondary to electrode placement during recent hospitalization. Symptoms improved with cortisone cream. - Apply CeraVe healing ointment to affected area       Today's visit was for Transitional Care Management.  The patient was discharged from [facility] on 07/11/2024 with a primary diagnosis of postural hypotension with presyncope.   Contact with the patient and/or caregiver, by a clinical staff member, was made on 07/12/2024 and was documented as a telephone encounter within the EMR.  Through chart review and discussion with the patient I have determined that management of their condition is of high complexity.    Continue all other maintenance medications.  Follow up plan: Return in 1 month (on 08/19/2024) for PCP as scheduled.    The above assessment and management plan was discussed with the patient. The  patient verbalized understanding of and has agreed to the management plan. Patient is aware to call the clinic if they develop any new symptoms or if symptoms persist or worsen. Patient is aware when to return to the clinic for a follow-up visit. Patient educated on when it is appropriate to go to the emergency department.   Rosaline Bruns, FNP-C [facility] [phone]

## 2024-07-19 NOTE — Patient Instructions (Signed)
CeraVe Healing Ointment

## 2024-07-20 ENCOUNTER — Telehealth: Payer: Self-pay | Admitting: Licensed Clinical Social Worker

## 2024-07-20 ENCOUNTER — Ambulatory Visit: Payer: Self-pay | Admitting: Family Medicine

## 2024-07-20 DIAGNOSIS — D500 Iron deficiency anemia secondary to blood loss (chronic): Secondary | ICD-10-CM

## 2024-07-20 LAB — CMP14+EGFR
ALT: 8 IU/L (ref 0–32)
AST: 17 IU/L (ref 0–40)
Albumin: 3.5 g/dL — ABNORMAL LOW (ref 3.9–4.9)
Alkaline Phosphatase: 63 IU/L (ref 44–121)
BUN/Creatinine Ratio: 13 (ref 12–28)
BUN: 12 mg/dL (ref 8–27)
Bilirubin Total: 0.4 mg/dL (ref 0.0–1.2)
CO2: 22 mmol/L (ref 20–29)
Calcium: 9 mg/dL (ref 8.7–10.3)
Chloride: 103 mmol/L (ref 96–106)
Creatinine, Ser: 0.93 mg/dL (ref 0.57–1.00)
Globulin, Total: 3.4 g/dL (ref 1.5–4.5)
Glucose: 89 mg/dL (ref 70–99)
Potassium: 3.7 mmol/L (ref 3.5–5.2)
Sodium: 139 mmol/L (ref 134–144)
Total Protein: 6.9 g/dL (ref 6.0–8.5)
eGFR: 66 mL/min/1.73 (ref >59)

## 2024-07-20 LAB — ANEMIA PROFILE B
Basophils Absolute: 0 x10E3/uL (ref 0.0–0.2)
Basos: 0 %
EOS (ABSOLUTE): 0.1 x10E3/uL (ref 0.0–0.4)
Eos: 1 %
Ferritin: 245 ng/mL — ABNORMAL HIGH (ref 15–150)
Folate: 19.4 ng/mL (ref >3.0)
Hematocrit: 35.5 % (ref 34.0–46.6)
Hemoglobin: 11.2 g/dL (ref 11.1–15.9)
Immature Grans (Abs): 0 x10E3/uL (ref 0.0–0.1)
Immature Granulocytes: 0 %
Iron Saturation: 9 % — CL (ref 15–55)
Iron: 24 ug/dL — ABNORMAL LOW (ref 27–139)
Lymphocytes Absolute: 1.7 x10E3/uL (ref 0.7–3.1)
Lymphs: 28 %
MCH: 27.5 pg (ref 26.6–33.0)
MCHC: 31.5 g/dL (ref 31.5–35.7)
MCV: 87 fL (ref 79–97)
Monocytes Absolute: 0.1 x10E3/uL (ref 0.1–0.9)
Monocytes: 2 %
Neutrophils Absolute: 4.2 x10E3/uL (ref 1.4–7.0)
Neutrophils: 69 %
Platelets: 254 x10E3/uL (ref 150–450)
RBC: 4.08 x10E6/uL (ref 3.77–5.28)
RDW: 15.8 % — ABNORMAL HIGH (ref 11.7–15.4)
Retic Ct Pct: 2.4 % (ref 0.6–2.6)
Total Iron Binding Capacity: 277 ug/dL (ref 250–450)
UIBC: 253 ug/dL (ref 118–369)
Vitamin B-12: >2000 pg/mL — ABNORMAL HIGH (ref 232–1245)
WBC: 6.1 x10E3/uL (ref 3.4–10.8)

## 2024-07-20 MED ORDER — IRON (FERROUS SULFATE) 325 (65 FE) MG PO TABS
325.0000 mg | ORAL_TABLET | Freq: Every day | ORAL | 1 refills | Status: AC
Start: 2024-07-20 — End: ?

## 2024-07-23 ENCOUNTER — Other Ambulatory Visit (HOSPITAL_COMMUNITY): Payer: Self-pay

## 2024-07-23 ENCOUNTER — Other Ambulatory Visit: Payer: Self-pay

## 2024-07-23 ENCOUNTER — Other Ambulatory Visit: Payer: Self-pay | Admitting: *Deleted

## 2024-07-23 ENCOUNTER — Other Ambulatory Visit: Payer: Self-pay | Admitting: Rheumatology

## 2024-07-23 DIAGNOSIS — M0579 Rheumatoid arthritis with rheumatoid factor of multiple sites without organ or systems involvement: Secondary | ICD-10-CM

## 2024-07-23 DIAGNOSIS — Z9225 Personal history of immunosupression therapy: Secondary | ICD-10-CM

## 2024-07-23 DIAGNOSIS — Z79899 Other long term (current) drug therapy: Secondary | ICD-10-CM

## 2024-07-23 DIAGNOSIS — Z111 Encounter for screening for respiratory tuberculosis: Secondary | ICD-10-CM

## 2024-07-23 NOTE — Progress Notes (Signed)
 Specialty Pharmacy Refill Coordination Note  Spoke with Jean Howell is a 68 y.o. female contacted today regarding refills of specialty medication(s) Abatacept  ([place]rencia  ClickJect)  Doses on hand: 1 for 06/25/24  Injection date: 08/02/24   Patient requested: Delivery   Delivery date: 07/26/24 (or when approved)   Verified address: [address] [place] [address]  Medication will be filled on 07/25/24 (or when approved).  **RR denied. Patient is aware that she will need to contact the providers office for more refills.**

## 2024-07-24 ENCOUNTER — Other Ambulatory Visit: Payer: Self-pay

## 2024-07-24 NOTE — Patient Outreach (Signed)
 Complex Care Management   Visit Note  07/24/2024  Name:  Jean Howell MRN: [id number] DOB: [birth year]  Situation: Referral received for Complex Care Management related to SDOH Barriers:  Home Health I obtained verbal consent from Patient.  Visit completed with Patient  on the phone  Background:   Past Medical History:  Diagnosis Date   Arthritis    Depression    Hypertension    Rheumatoid arthritis (HCC)     Assessment:  Patient was approved for foodstamps and the amount was increased.  Patient did not receive a return call from [employer] but plans to call as she is familiar with the program. Patient is currently on the Meals on Wheels waiting list.  SDOH Interventions    Flowsheet Row Patient Outreach Telephone from 07/18/2024 in [place] POPULATION HEALTH DEPARTMENT Patient Outreach Telephone from 07/13/2024 in [place] POPULATION HEALTH DEPARTMENT Patient Outreach Telephone from 07/06/2024 in [place] POPULATION HEALTH DEPARTMENT Clinical Support from 06/25/2024 in [facility] Office Visit from 06/18/2024 in [facility] [place] Telephone from 05/16/2024 in [place] POPULATION HEALTH DEPARTMENT  SDOH Interventions        Food Insecurity Interventions Intervention Not Indicated Intervention Not Indicated  [[employer]] Community Resources Provided, [employer] Resources Referral Intervention Not Indicated Intervention Not Indicated Other (Comment)  [Patient is on wait list for Meals on Wheel and receives $23 monthly in SNAP benefits. Emailed Environmental manager for [employer] [place].]  Housing Interventions Intervention Not Indicated -- Intervention Not Indicated Intervention Not Indicated Intervention Not Indicated --  Transportation Interventions Intervention Not Indicated -- -- Intervention Not Indicated Intervention Not Indicated --  Utilities Interventions Intervention Not Indicated -- -- Intervention  Not Indicated Intervention Not Indicated --  Depression Interventions/Treatment  -- -- -- PHQ2-9 Score <4 Follow-up Not Indicated Patient refuses Treatment --  Financial Strain Interventions -- Intervention Not Indicated -- Intervention Not Indicated -- --  Physical Activity Interventions -- -- -- Intervention Not Indicated -- --  Stress Interventions -- -- -- Intervention Not Indicated -- --  Health Literacy Interventions -- -- -- Intervention Not Indicated -- --      Recommendation:   None  Follow Up Plan:   Telephone follow up appointment date/time:  07/30/24 at 2pm  Tillman Gardener, BSW   [facility], [facility] Social Worker Direct Dial: [phone]  Fax: [phone] Website: [email or web address]

## 2024-07-24 NOTE — Patient Instructions (Signed)
 Visit Information  Thank you for taking time to visit with me today. Please don't hesitate to contact me if I can be of assistance to you before our next scheduled appointment.  Your next care management appointment is by telephone on 07/30/24 at 2pm   Please call the care guide team at [phone] if you need to cancel, schedule, or reschedule an appointment.   Please call 911 if you are experiencing a Mental Health or Behavioral Health Crisis or need someone to talk to.  Tillman Gardener, BSW [place]  [facility], [facility] Social Worker Direct Dial: [phone]  Fax: [phone] Website: [email or web address]

## 2024-07-25 ENCOUNTER — Other Ambulatory Visit: Payer: Self-pay

## 2024-07-27 ENCOUNTER — Other Ambulatory Visit

## 2024-07-27 ENCOUNTER — Other Ambulatory Visit: Payer: Self-pay

## 2024-07-27 ENCOUNTER — Other Ambulatory Visit: Payer: Self-pay | Admitting: Licensed Clinical Social Worker

## 2024-07-27 ENCOUNTER — Ambulatory Visit

## 2024-07-27 DIAGNOSIS — Z111 Encounter for screening for respiratory tuberculosis: Secondary | ICD-10-CM

## 2024-07-27 NOTE — Patient Instructions (Signed)
 Visit Information  Thank you for taking time to visit with me today. Please don't hesitate to contact me if I can be of assistance to you before our next scheduled appointment.  Your next care management appointment is by telephone on 08/10/24 at 10 am  Please call the care guide team at [phone] if you need to cancel, schedule, or reschedule an appointment.   Please call the Suicide and Crisis Lifeline: 988 go to [facility] [address], [employer] [phone]) call the [facility] Line: [phone] if you are experiencing a Mental Health or Behavioral Health Crisis or need someone to talk to.  Lyle Rung, BSW, MSW, LCSW Licensed Clinical Social Worker [employer]   [facility] [email or web address] Direct Dial: [phone]

## 2024-07-30 ENCOUNTER — Ambulatory Visit: Payer: Self-pay | Admitting: Family Medicine

## 2024-07-30 ENCOUNTER — Other Ambulatory Visit: Payer: Self-pay

## 2024-07-30 LAB — QUANTIFERON-TB GOLD PLUS
QuantiFERON Mitogen Value: 0.88 [IU]/mL
QuantiFERON Nil Value: 0.06 [IU]/mL
QuantiFERON TB1 Ag Value: 0.06 [IU]/mL
QuantiFERON TB2 Ag Value: 0.06 [IU]/mL

## 2024-07-30 NOTE — Patient Outreach (Signed)
 Complex Care Management   Visit Note  07/30/2024  Name:  Jean Howell MRN: [id number] DOB: [birth year]  Situation: Referral received for Complex Care Management related to SDOH Barriers:  Personal care I obtained verbal consent from Patient.  Visit completed with Patient  on the phone  Background:   Past Medical History:  Diagnosis Date   Arthritis    Depression    Hypertension    Rheumatoid arthritis (HCC)     Assessment:  Patient spoke to staff at [facility] and patient is already on the [facility] waiting list for the past 7 months. Patient hopes to be approved soon. Patient will await follow up from [facility].  No follow up is requested as goal is completed.  SDOH Interventions    Flowsheet Row Patient Outreach Telephone from 07/18/2024 in [employer] [facility] DEPARTMENT Patient Outreach Telephone from 07/13/2024 in [place] POPULATION HEALTH DEPARTMENT Patient Outreach Telephone from 07/06/2024 in [place] POPULATION HEALTH DEPARTMENT Clinical Support from 06/25/2024 in [facility] Office Visit from 06/18/2024 in [facility] [place] Telephone from 05/16/2024 in [place] POPULATION HEALTH DEPARTMENT  SDOH Interventions        Food Insecurity Interventions Intervention Not Indicated Intervention Not Indicated  [[employer]] Community Resources Provided, [employer] Resources Referral Intervention Not Indicated Intervention Not Indicated Other (Comment)  [Patient is on wait list for [employer] and receives $23 monthly in SNAP benefits. Emailed Environmental manager for [employer] [place].]  Housing Interventions Intervention Not Indicated -- Intervention Not Indicated Intervention Not Indicated Intervention Not Indicated --  Transportation Interventions Intervention Not Indicated -- -- Intervention Not Indicated Intervention Not Indicated --  Utilities Interventions Intervention Not Indicated -- -- Intervention  Not Indicated Intervention Not Indicated --  Depression Interventions/Treatment  -- -- -- PHQ2-9 Score <4 Follow-up Not Indicated Patient refuses Treatment --  Financial Strain Interventions -- Intervention Not Indicated -- Intervention Not Indicated -- --  Physical Activity Interventions -- -- -- Intervention Not Indicated -- --  Stress Interventions -- -- -- Intervention Not Indicated -- --  Health Literacy Interventions -- -- -- Intervention Not Indicated -- --      Recommendation:   None  Follow Up Plan:   Patient has met all care management goals. Care Management case will be closed. Patient has been provided contact information should new needs arise.   Tillman Gardener, BSW   [facility], [facility] Social Worker Direct Dial: [phone]  Fax: [phone] Website: [email or web address]

## 2024-07-30 NOTE — Patient Instructions (Signed)

## 2024-08-01 ENCOUNTER — Telehealth: Payer: Self-pay | Admitting: *Deleted

## 2024-08-02 ENCOUNTER — Other Ambulatory Visit: Payer: Self-pay

## 2024-08-02 DIAGNOSIS — M0579 Rheumatoid arthritis with rheumatoid factor of multiple sites without organ or systems involvement: Secondary | ICD-10-CM

## 2024-08-02 DIAGNOSIS — Z79899 Other long term (current) drug therapy: Secondary | ICD-10-CM

## 2024-08-02 MED ORDER — ORENCIA CLICKJECT 125 MG/ML ~~LOC~~ SOAJ
125.0000 mg | SUBCUTANEOUS | 0 refills | Status: DC
  Filled 2024-08-02: qty 4, 28d supply, fill #0
  Filled 2024-08-27: qty 4, 28d supply, fill #1
  Filled 2024-09-21: qty 4, 28d supply, fill #2

## 2024-08-02 NOTE — Telephone Encounter (Signed)
 Last Fill: 06/27/2024  Labs: 07/19/2024 Albumin 3.5 07/11/2024 CBC WBC 3.2 RBC 3.63 Hemoglobin 9.9 HCT 30.9 RDW 16.6   TB Gold: 07/27/2024 TB Gold Negative    Next Visit: 09/13/2024  Last Visit: 03/14/2024  DX: Rheumatoid arthritis involving multiple sites with positive rheumatoid factor   Current Dose per office note 03/14/2024: Orencia  125mg  sq injections once weekly   Okay to refill Orencia ?   Patient called the office to see if we received her results for her TB Gold advised that we did. Patient said she needed a refill on her Orencia  as she is due for injection today. Recommended she take her injection once she receives it an that the day she takes it will be her new injection day since she will be late getting it.

## 2024-08-03 ENCOUNTER — Other Ambulatory Visit: Payer: Self-pay | Admitting: Family Medicine

## 2024-08-03 NOTE — Telephone Encounter (Signed)
 Copied from CRM [id number]. Topic: Clinical - Medication Refill >> Aug 03, 2024  3:33 PM DeAngela L wrote: Medication: losartan  (COZAAR ) 50 MG tablet  Patient would like a 90 day supply of this medication   Has the patient contacted their pharmacy? Yes  (Agent: If no, request that the patient contact the pharmacy for the refill. If patient does not wish to contact the pharmacy document the reason why and proceed with request.) (Agent: If yes, when and what did the pharmacy advise?)  This is the patient's preferred pharmacy:  [facility], [place] - [address] [place] [address] Phone: [phone] Fax: [phone] Is this the correct pharmacy for this prescription? Yes  If no, delete pharmacy and type the correct one.   Has the prescription been filled recently? Yes   Is the patient out of the medication? No   Has the patient been seen for an appointment in the last year OR does the patient have an upcoming appointment? Yes  Can we respond through MyChart? Yes   Agent: Please be advised that Rx refills may take up to 3 business days. We ask that you follow-up with your pharmacy.

## 2024-08-09 ENCOUNTER — Other Ambulatory Visit: Payer: Self-pay | Admitting: *Deleted

## 2024-08-09 MED ORDER — LOSARTAN POTASSIUM 50 MG PO TABS: 50.0000 mg | ORAL_TABLET | Freq: Every day | ORAL | 0 refills | Status: DC

## 2024-08-09 NOTE — Patient Instructions (Signed)
 Visit Information  Thank you for taking time to visit with me today. Please don't hesitate to contact me if I can be of assistance to you before our next scheduled appointment.  Your next care management appointment is by telephone on 09-10-2024 at 11:45 am  Telephone follow-up in 1 month  Please call the care guide team at [phone] if you need to cancel, schedule, or reschedule an appointment.   Please call the Suicide and Crisis Lifeline: 988 call the [place]  National Suicide Prevention Lifeline: (270)722-6678 or TTY: (845) 422-8507 TTY 684-562-2004) to talk to a trained counselor call 1-800-273-TALK (toll free, 24 hour hotline) call the [facility]: ([phone] call 911 if you are experiencing a Mental Health or Behavioral Health Crisis or need someone to talk to.  Rosina Forte, BSN RN [facility], [facility] Health RN Care Manager Direct Dial: [phone]  Fax: [phone]

## 2024-08-09 NOTE — Patient Outreach (Signed)
 Complex Care Management   Visit Note  08/09/2024  Name:  Jean Howell MRN: [id number] DOB: Apr 10, 1956  Situation: Referral received for Complex Care Management related to HTN I obtained verbal consent from Patient.  Visit completed with Patient  on the phone  Background:   Past Medical History:  Diagnosis Date   Arthritis    Depression    Hypertension    Rheumatoid arthritis (HCC)     Assessment: Patient Reported Symptoms:  Cognitive Cognitive Status: No symptoms reported Cognitive/Intellectual Conditions Management [RPT]: None reported or documented in medical history or problem list   Health Maintenance Behaviors: Annual physical exam Healing Pattern: Average Health Facilitated by: Rest, Prayer/meditation  Neurological Neurological Review of Symptoms: No symptoms reported    HEENT HEENT Symptoms Reported: No symptoms reported      Cardiovascular Cardiovascular Symptoms Reported: No symptoms reported Does patient have uncontrolled Hypertension?: No Cardiovascular Management Strategies: Routine screening  Respiratory Respiratory Symptoms Reported: Dry cough    Endocrine Endocrine Symptoms Reported: No symptoms reported Is patient diabetic?: No Endocrine Self-Management Outcome: 4 (good)  Gastrointestinal Gastrointestinal Symptoms Reported: No symptoms reported      Genitourinary Genitourinary Symptoms Reported: No symptoms reported Genitourinary Self-Management Outcome: 4 (good)  Integumentary Integumentary Symptoms Reported: No symptoms reported Skin Management Strategies: Routine screening  Musculoskeletal Musculoskelatal Symptoms Reviewed: Difficulty walking, Limited mobility Musculoskeletal Management Strategies: Medical device Musculoskeletal Comment: uses cane/walker Falls in the past year?: No Number of falls in past year: 1 or less Was there an injury with Fall?: No Fall Risk Category Calculator: 0 Patient Fall Risk Level: Low Fall Risk Patient at Risk  for Falls Due to: No Fall Risks Fall risk Follow up: Falls evaluation completed  Psychosocial Psychosocial Symptoms Reported: No symptoms reported Behavioral Health Self-Management Outcome: 4 (good) Major Change/Loss/Stressor/Fears (CP): Denies Techniques to Cope with Loss/Stress/Change: Not applicable Quality of Family Relationships: helpful Do you feel physically threatened by others?: No    08/09/2024    PHQ2-9 Depression Screening   Little interest or pleasure in doing things Nearly every day  Feeling down, depressed, or hopeless Not at all  PHQ-2 - Total Score 3  Trouble falling or staying asleep, or sleeping too much Not at all  Feeling tired or having little energy Nearly every day  Poor appetite or overeating  Not at all  Feeling bad about yourself - or that you are a failure or have let yourself or your family down Not at all  Trouble concentrating on things, such as reading the newspaper or watching television Not at all  Moving or speaking so slowly that other people could have noticed.  Or the opposite - being so fidgety or restless that you have been moving around a lot more than usual Not at all  Thoughts that you would be better off dead, or hurting yourself in some way Not at all  PHQ2-9 Total Score 6  If you checked off any problems, how difficult have these problems made it for you to do your work, take care of things at home, or get along with other people Not difficult at all  Depression Interventions/Treatment      Vitals:   08/09/24 1209  BP: (!) 148/97  Pulse: (!) 104    Medications Reviewed Today     Reviewed by Bertrum Rosina HERO, RN (Registered Nurse) on 08/09/24 at 1206  Med List Status: <None>   Medication Order Taking? Sig Documenting Provider Last Dose Status Informant  Abatacept  (ORENCIA  CLICKJECT) 125 MG/ML SOAJ  [id number] Yes Inject 125 mg into the skin once a week. Dolphus Reiter, MD  Active   acetaminophen  (TYLENOL ) 650 MG CR tablet [id number]  Yes Take 1,300 mg by mouth every 8 (eight) hours as needed for pain. [provider]  Active Self, Pharmacy Records  apixaban  (ELIQUIS ) 5 MG TABS tablet [id number] Yes Take 2 tablets (10 mg total) by mouth 2 (two) times daily for 7 days, THEN 1 tablet (5 mg total) 2 (two) times daily. Willette Adriana LABOR, MD  Active Self, Pharmacy Records  betamethasone dipropionate (DIPROLENE) 0.05 % ointment [id number] Yes Apply topically as needed. [provider]  Active Self, Pharmacy Records  Calcium -Magnesium-Zinc (CAL-MAG-ZINC PO) [id number] Yes Take 1 tablet by mouth daily. [provider]  Active Self, Pharmacy Records  cyanocobalamin  (VITAMIN B12) 1000 MCG tablet [id number]  Take 1 tablet by mouth daily.  Patient not taking: Reported on 08/09/2024   [provider]  Active Self, Pharmacy Records  Ergocalciferol  (VITAMIN D2) 10 MCG (400 UNIT) TABS [id number] Yes Take 1 tablet by mouth daily. [provider]  Active Self, Pharmacy Records  fluticasone  (FLONASE ) 50 MCG/ACT nasal spray [id number] Yes USE 2 SPRAYS IN EACH NOSTRIL DAILY Gottschalk, Ashly M, DO  Active Self, Pharmacy Records  gabapentin  (NEURONTIN ) 600 MG tablet [id number] Yes Take 1 tablet (600 mg total) by mouth at bedtime. **dose change Jolinda Norene HERO, DO  Active Self, Pharmacy Records  Iron , Ferrous Sulfate , 325 (65 Fe) MG TABS [id number] Yes Take 325 mg by mouth daily. Severa Rock HERO, FNP  Active   leflunomide  (ARAVA ) 20 MG tablet [id number] Yes TAKE ONE (1) TABLET BY MOUTH EVERY DAY Cheryl Waddell HERO, PA-C  Active Self, Pharmacy Records  losartan  (COZAAR ) 50 MG tablet [id number] Yes Take 1 tablet (50 mg total) by mouth daily. Tobie Yetta HERO, MD  Active   Multiple Vitamin (MULTIVITAMIN PO) [id number] Yes Take 1 tablet by mouth daily. [provider]  Active Self, Pharmacy Records  rosuvastatin  (CRESTOR ) 10 MG tablet [id number] Yes Take 1 tablet (10 mg total) by mouth daily. Jolinda Norene HERO, DO   Active Self, Pharmacy Records  Suvorexant  (BELSOMRA ) 10 MG TABS [id number] Yes Take 10 mg by mouth at bedtime as needed (sleep). Place on hold Jolinda Norene HERO, DO  Active Self, Pharmacy Records  UNABLE TO LARA [id number] Yes Med Name: CBD roll on, as needed [provider]  Active Self, Pharmacy Records  VITAMIN E  PO [id number] Yes Take 1 capsule by mouth daily. [provider]  Active Self, Pharmacy Records            Recommendation:   Continue Current Plan of Care  Follow Up Plan:   Telephone follow-up in 1 month  Rosina Forte, BSN RN [facility] Health RN Care Manager Direct Dial: [phone]  Fax: [phone]

## 2024-08-10 ENCOUNTER — Other Ambulatory Visit: Payer: Self-pay | Admitting: Licensed Clinical Social Worker

## 2024-08-10 NOTE — Patient Instructions (Addendum)
 Visit Information  Thank you for taking time to visit with me today. Please don't hesitate to contact me if I can be of assistance to you before our next scheduled appointment.  Our next appointment is by telephone on 09/11/24 at 11 am Please call the care guide team at [phone] if you need to cancel or reschedule your appointment.   Following is a copy of your care plan:   Goals Addressed             This Visit's Progress    LCSW [facility] Social Work Care Plan       Problems:   Limited access to caregiver, Limited social support, and Social Isolation, Disease Management support and education needs related to Stress at managing health care, and Financial Strain  and Transportation  CSW Clinical Goal(s):   Over the next 90 days the Patient will attend all scheduled medical appointments as evidenced by patient report and care team review of appointment completion in electronic MEDICAL RECORD NUMBERby VBCI LCSW demonstrate a reduction in symptoms related to Caregiver Stress .  Interventions:  Level of Care Concerns in a patient with HTN Current level of care: home, alone Evaluation of patient's unmet needs in current living environment ADL's Assessed needs, level of care concerns, how currently meeting needs and barriers to care Discussed family support and building support system : Advocating for self Discussed private pay options for personal care needs (Out of pocket expense that patient is not interested in at this time) DSS in-home aide program:(BSW referral placed for [facility], pt is already on wait list for [facility] program (has been on for 7 months)) Problem Solving/Task Center strategies reviewed Solution-Focused Strategies employed: Looking at the positives that are taking place in her life currently  Active listening / Reflection utilized Mining engineer reviewed Caregiver stress acknowledged :Patient shares that she feels like a burden to others. Emotional support provided.   Crisis Resource Education / information provided Depression screen reviewed Discussed referral for psychiatry: Pt will consider this- patient reports not making a decision on this yet per 08/10/24 visit. Discussed referral options to connect for ongoing therapy: Pt will consider this resource addition. Pt reports still considering this resource at this time. VBCI LCSW will make one final follow up call to see if patient desires behavioral health referrals before closing. Emotional Support Provided Quality of sleep assessed & Sleep Hygiene techniques promoted Suicidal Ideation/Homicidal Ideation assessed: No SI/HI Social Determinants of Health in Patient with HTN and Stress at Managing Physical and Mental Health: SDOH assessments completed: Financial Strain , Food Insecurity , and Transportation Evaluation of current treatment plan related to unmet needs Food resources: Pt is on wait list for Meals on Wheels  Patient Goals/Self-Care Activities:  Increase coping skills, healthy habits, self-management skills, and stress reduction  Plan:   The care management team will reach out to the patient again over the next 30 days.        Please call the Suicide and Crisis Lifeline: 988 call the [place]  National Suicide Prevention Lifeline: 3018249346 or TTY: (254)466-6492 TTY 972-370-7137) to talk to a trained counselor call 1-800-273-TALK (toll free, 24 hour hotline) call the [facility]: 920-667-5401 if you are experiencing a Mental Health or Behavioral Health Crisis or need someone to talk to.  Patient verbalizes understanding of instructions and care plan provided today and agrees to view in MyChart. Active MyChart status and patient understanding of how to access instructions and care plan via MyChart confirmed with  patient.     Lyle Rung, BSW, MSW, LCSW Licensed Clinical Social Worker [employer]   [facility] [email or web address] Direct Dial:  [phone]

## 2024-08-10 NOTE — Patient Outreach (Signed)
 Complex Care Management   Visit Note  08/10/2024  Name:  Jean Howell MRN: [id number] DOB: [birth year]  Situation: Referral received for Complex Care Management related to Mental/Behavioral Health diagnosis stress managing physical and behavioral health symptoms. I obtained verbal consent from Patient.  Visit completed with Patient  on the phone  Background:   Past Medical History:  Diagnosis Date   Arthritis    Depression    Hypertension    Rheumatoid arthritis (HCC)     Assessment: Patient Reported Symptoms:  Cognitive Cognitive Status: Normal speech and language skills, Able to follow simple commands Cognitive/Intellectual Conditions Management [RPT]: None reported or documented in medical history or problem list   Health Maintenance Behaviors: Annual physical exam Healing Pattern: Average Health Facilitated by: Stress management, Prayer/meditation  Neurological Neurological Review of Symptoms: No symptoms reported Neurological Management Strategies: Routine screening Neurological Self-Management Outcome: 3 (uncertain)  HEENT HEENT Symptoms Reported: No symptoms reported HEENT Management Strategies: Routine screening HEENT Self-Management Outcome: 4 (good)    Cardiovascular Cardiovascular Symptoms Reported: No symptoms reported Does patient have uncontrolled Hypertension?: No Cardiovascular Management Strategies: Routine screening Cardiovascular Self-Management Outcome: 4 (good)  Respiratory Respiratory Symptoms Reported: Dry cough Respiratory Management Strategies: Routine screening Respiratory Self-Management Outcome: 3 (uncertain)  Psychosocial Psychosocial Symptoms Reported: No symptoms reported Additional Psychological Details: Pt is still considering BH resources and will review these options with her son/family over the next 30 days Behavioral Management Strategies: Coping strategies Behavioral Health Self-Management Outcome: 4 (good) Major  Change/Loss/Stressor/Fears (CP): Denies Quality of Family Relationships: helpful Do you feel physically threatened by others?: No    08/10/2024    PHQ2-9 Depression Screening   Little interest or pleasure in doing things Nearly every day  Feeling down, depressed, or hopeless Not at all  PHQ-2 - Total Score 3  Trouble falling or staying asleep, or sleeping too much Not at all  Feeling tired or having little energy Nearly every day  Poor appetite or overeating  Several days  Feeling bad about yourself - or that you are a failure or have let yourself or your family down Not at all  Trouble concentrating on things, such as reading the newspaper or watching television Not at all  Moving or speaking so slowly that other people could have noticed.  Or the opposite - being so fidgety or restless that you have been moving around a lot more than usual Not at all  Thoughts that you would be better off dead, or hurting yourself in some way Not at all  PHQ2-9 Total Score 7  If you checked off any problems, how difficult have these problems made it for you to do your work, take care of things at home, or get along with other people Not difficult at all  Depression Interventions/Treatment Patient refuses Treatment, PHQ2-9 Score <4 Follow-up Not Indicated      07/06/2024   12:54 PM 05/21/2024    2:24 PM 01/03/2024    2:09 PM 02/08/2023    2:33 PM  GAD 7 : Generalized Anxiety Score  Nervous, Anxious, on Edge 0 0 0 0  Control/stop worrying 0 0 0 0  Worry too much - different things 0 0 0 0  Trouble relaxing 0 0 0 0  Restless 0 0 0 0  Easily annoyed or irritable 0 0 0 0  Afraid - awful might happen 0 0 0 0  Total GAD 7 Score 0 0 0 0  Anxiety Difficulty  Not difficult at all Not difficult at  all Not difficult at all    Social Drivers of Health with Concerns   Tobacco Use: Medium Risk (07/19/2024)   Patient History    Smoking Tobacco Use: Former    Smokeless Tobacco Use: Never    Passive Exposure:  Past  Physicist, medical Strain: Low Risk  (07/13/2024)   Overall Financial Resource Strain (CARDIA)    Difficulty of Paying Living Expenses: Not very hard  Recent Concern: Financial Resource Strain - Medium Risk (05/16/2024)   Overall Financial Resource Strain (CARDIA)    Difficulty of Paying Living Expenses: Somewhat hard  Food Insecurity: No Food Insecurity (08/10/2024)   Hunger Vital Sign    Worried About Running Out of Food in the Last Year: Never true    Ran Out of Food in the Last Year: Never true  Recent Concern: Food Insecurity - Food Insecurity Present (07/11/2024)   Hunger Vital Sign    Worried About Running Out of Food in the Last Year: Sometimes true    Ran Out of Food in the Last Year: Never true  Physical Activity: Insufficiently Active (06/25/2024)   Exercise Vital Sign    Days of Exercise per Week: 7 days    Minutes of Exercise per Session: 10 min  Stress: No Stress Concern Present (08/10/2024)   [employer] of Occupational Health - Occupational Stress Questionnaire    Feeling of Stress: Only a little  Recent Concern: Stress - Stress Concern Present (06/25/2024)   [employer] of Occupational Health - Occupational Stress Questionnaire    Feeling of Stress: Very much  Social Connections: Socially Isolated (07/11/2024)   Social Connection and Isolation Panel    Frequency of Communication with Friends and Family: Never    Frequency of Social Gatherings with Friends and Family: Never    Attends Religious Services: Never    Database administrator or Organizations: Not on file    Attends Banker Meetings: Never    Marital Status: Widowed  Depression (PHQ2-9): Medium Risk (08/10/2024)   Depression (PHQ2-9)    PHQ-2 Score: 7   There were no vitals filed for this visit.  Medications Reviewed Today     Reviewed by Merlynn Lyle CROME, LCSW (Social Worker) on 08/10/24 at 1436  Med List Status: <None>   Medication Order Taking? Sig Documenting Provider Last Dose  Status Informant  Abatacept  (ORENCIA  CLICKJECT) 125 MG/ML SOAJ [id number]  Inject 125 mg into the skin once a week. Dolphus Reiter, MD  Active   acetaminophen  (TYLENOL ) 650 MG CR tablet [id number]  Take 1,300 mg by mouth every 8 (eight) hours as needed for pain. [provider]  Active Self, Pharmacy Records  apixaban  (ELIQUIS ) 5 MG TABS tablet [id number]  Take 2 tablets (10 mg total) by mouth 2 (two) times daily for 7 days, THEN 1 tablet (5 mg total) 2 (two) times daily. Willette Adriana LABOR, MD  Active Self, Pharmacy Records  betamethasone dipropionate (DIPROLENE) 0.05 % ointment [id number]  Apply topically as needed. [provider]  Active Self, Pharmacy Records  Calcium -Magnesium-Zinc (CAL-MAG-ZINC PO) [id number]  Take 1 tablet by mouth daily. [provider]  Active Self, Pharmacy Records  cyanocobalamin  (VITAMIN B12) 1000 MCG tablet [id number]  Take 1 tablet by mouth daily.  Patient not taking: Reported on 08/09/2024   [provider]  Active Self, Pharmacy Records  Ergocalciferol  (VITAMIN D2) 10 MCG (400 UNIT) TABS [id number]  Take 1 tablet by mouth daily. [provider]  Active Self, Pharmacy Records  fluticasone  (FLONASE )  50 MCG/ACT nasal spray [id number]  USE 2 SPRAYS IN EACH NOSTRIL DAILY Gottschalk, Ashly M, DO  Active Self, Pharmacy Records  gabapentin  (NEURONTIN ) 600 MG tablet [id number]  Take 1 tablet (600 mg total) by mouth at bedtime. **dose change Jolinda Norene HERO, DO  Active Self, Pharmacy Records  Iron , Ferrous Sulfate , 325 (65 Fe) MG TABS [id number]  Take 325 mg by mouth daily. Severa Rock HERO, FNP  Active   leflunomide  (ARAVA ) 20 MG tablet [id number]  TAKE ONE (1) TABLET BY MOUTH EVERY DAY Cheryl Waddell HERO, PA-C  Active Self, Pharmacy Records  losartan  (COZAAR ) 50 MG tablet [id number]  Take 1 tablet (50 mg total) by mouth daily. Dettinger, Fonda LABOR, MD  Active   Multiple Vitamin (MULTIVITAMIN PO) [id number]  Take 1 tablet by mouth daily. [provider]  Active Self, Pharmacy Records  rosuvastatin  (CRESTOR ) 10 MG tablet [id number]  Take 1 tablet (10 mg total) by mouth daily. Jolinda Norene HERO, DO  Active Self, Pharmacy Records  Suvorexant  (BELSOMRA ) 10 MG TABS [id number]  Take 10 mg by mouth at bedtime as needed (sleep). Place on hold Jolinda Norene HERO, DO  Active Self, Pharmacy Records  UNABLE TO LARA [id number]  Med Name: CBD roll on, as needed [provider]  Active Self, Pharmacy Records  VITAMIN E  PO [id number]  Take 1 capsule by mouth daily. [provider]  Active Self, Pharmacy Records            Recommendation:   PCP Follow-up Continue Current Plan of Care  Follow Up Plan:   Telephone follow-up in 1 month  Lyle Rung, BSW, MSW, LCSW Licensed Clinical Social Worker [employer]   [facility] [email or web address] Direct Dial: [phone]

## 2024-08-14 ENCOUNTER — Other Ambulatory Visit: Payer: Self-pay | Admitting: Family Medicine

## 2024-08-20 ENCOUNTER — Telehealth: Payer: Self-pay | Admitting: Oncology

## 2024-08-20 NOTE — Telephone Encounter (Signed)
 Called PT to reschedule appt. Appt day and time confirmed.

## 2024-08-21 ENCOUNTER — Inpatient Hospital Stay: Admitting: Oncology

## 2024-08-21 ENCOUNTER — Inpatient Hospital Stay

## 2024-08-22 ENCOUNTER — Encounter: Payer: Self-pay | Admitting: Family Medicine

## 2024-08-22 ENCOUNTER — Ambulatory Visit: Payer: 59 | Admitting: Family Medicine

## 2024-08-22 VITALS — BP 150/97 | HR 99 | Temp 97.0°F | Ht 63.0 in | Wt 203.0 lb

## 2024-08-22 DIAGNOSIS — I10 Essential (primary) hypertension: Secondary | ICD-10-CM

## 2024-08-22 DIAGNOSIS — Z0000 Encounter for general adult medical examination without abnormal findings: Secondary | ICD-10-CM

## 2024-08-22 DIAGNOSIS — Z0001 Encounter for general adult medical examination with abnormal findings: Secondary | ICD-10-CM | POA: Diagnosis not present

## 2024-08-22 DIAGNOSIS — M5432 Sciatica, left side: Secondary | ICD-10-CM | POA: Diagnosis not present

## 2024-08-22 DIAGNOSIS — I2699 Other pulmonary embolism without acute cor pulmonale: Secondary | ICD-10-CM

## 2024-08-22 DIAGNOSIS — E7800 Pure hypercholesterolemia, unspecified: Secondary | ICD-10-CM | POA: Diagnosis not present

## 2024-08-22 MED ORDER — LOSARTAN POTASSIUM 50 MG PO TABS: 50.0000 mg | ORAL_TABLET | Freq: Every day | ORAL | 3 refills | Status: DC

## 2024-08-22 MED ORDER — GABAPENTIN 600 MG PO TABS: 300.0000 mg | ORAL_TABLET | Freq: Every day | ORAL | Status: DC

## 2024-08-22 NOTE — Patient Instructions (Addendum)
 Strongly consider seeing the back doctor for that leg.  I'm worried it's hurting you so bad.

## 2024-08-22 NOTE — Progress Notes (Signed)
 Jean Howell is a 68 y.o. female who is accompanied to today's visit by a friend.  She presents to office today for annual physical exam examination.    Discussed the use of AI scribe software for clinical note transcription with the patient, who gave verbal consent to proceed.  History of Present Illness   Jean Howell is a 68 year old female with arthritis who presents with severe leg pain and mobility issues.  She experiences severe pain in her left leg, which has worsened to the point where she requires a wheelchair for mobility. The pain is located in the leg that has not undergone surgery and is persistent, affecting her ability to move around her home. She uses a walker that converts into a wheelchair for transport.  She is on weekly injections for her autoimmune condition and takes leflunomide . These medications have reduced her pain but have not eliminated it. She experiences pain in multiple joints, including her shoulders, and uses muscle relaxers for relief, although they provide limited help. She wants to stop taking gabapentin  due to ineffectiveness.  She has a history of blood clots in her lung and is currently on blood thinners, approaching the three-month mark of treatment. She was supposed to see a hematology doctor recently but had to reschedule the appointment to the 23rd of this month.   She takes turmeric capsules for joint pain and is aware that it may cause easier bleeding. No vaginal or rectal bleeding, and normal bowel movements and urination.  She reports that her blood pressure was high because she was in pain when she arrived.      Occupation: retired, Substance use: none Health Maintenance Due  Topic Date Due   Zoster Vaccines- Shingrix (1 of 2) Never done   Refills needed today: none  Immunization History  Administered Date(s) Administered   Fluad Quad(high Dose 65+) 09/16/2020, 09/07/2022   Influenza, Quadrivalent, Recombinant, Inj, Pf 09/19/2019    PFIZER(Purple Top)SARS-COV-2 Vaccination 05/23/2020, 06/13/2020, 07/31/2020, 02/28/2021   PNEUMOCOCCAL CONJUGATE-20 07/07/2022   Past Medical History:  Diagnosis Date   Arthritis    Depression    Hypertension    Rheumatoid arthritis (HCC)    Social History   Socioeconomic History   Marital status: Widowed    Spouse name: Not on file   Number of children: 1   Years of education: 12th   Highest education level: GED or equivalent  Occupational History   Occupation: retired  Tobacco Use   Smoking status: Former    Current packs/day: 0.00    Average packs/day: 1 pack/day for 15.0 years (15.0 ttl pk-yrs)    Types: Cigarettes    Start date: 45    Quit date: 2000    Years since quitting: 25.7    Passive exposure: Past   Smokeless tobacco: Never  Vaping Use   Vaping status: Never Used  Substance and Sexual Activity   Alcohol use: Not Currently   Drug use: Never   Sexual activity: Not Currently    Comment: widowed  Other Topics Concern   Not on file  Social History Narrative   Patient is a widow that resides independently in [place].  She has 1 son, who resides in [place].   Social Drivers of Health   Financial Resource Strain: Low Risk  (07/13/2024)   Overall Financial Resource Strain (CARDIA)    Difficulty of Paying Living Expenses: Not very hard  Recent Concern: Financial Resource Strain - Medium Risk (05/16/2024)   Overall Financial  Resource Strain (CARDIA)    Difficulty of Paying Living Expenses: Somewhat hard  Food Insecurity: No Food Insecurity (08/10/2024)   Hunger Vital Sign    Worried About Running Out of Food in the Last Year: Never true    Ran Out of Food in the Last Year: Never true  Recent Concern: Food Insecurity - Food Insecurity Present (07/11/2024)   Hunger Vital Sign    Worried About Running Out of Food in the Last Year: Sometimes true    Ran Out of Food in the Last Year: Never true  Transportation Needs: No Transportation Needs (08/10/2024)    PRAPARE - Administrator, Civil Service (Medical): No    Lack of Transportation (Non-Medical): No  Physical Activity: Insufficiently Active (06/25/2024)   Exercise Vital Sign    Days of Exercise per Week: 7 days    Minutes of Exercise per Session: 10 min  Stress: No Stress Concern Present (08/10/2024)   [employer] of Occupational Health - Occupational Stress Questionnaire    Feeling of Stress: Only a little  Recent Concern: Stress - Stress Concern Present (06/25/2024)   [employer] of Occupational Health - Occupational Stress Questionnaire    Feeling of Stress: Very much  Social Connections: Socially Isolated (07/11/2024)   Social Connection and Isolation Panel    Frequency of Communication with Friends and Family: Never    Frequency of Social Gatherings with Friends and Family: Never    Attends Religious Services: Never    Database administrator or Organizations: Not on file    Attends Banker Meetings: Never    Marital Status: Widowed  Intimate Partner Violence: Not At Risk (07/18/2024)   Humiliation, Afraid, Rape, and Kick questionnaire    Fear of Current or Ex-Partner: No    Emotionally Abused: No    Physically Abused: No    Sexually Abused: No   Past Surgical History:  Procedure Laterality Date   BREAST EXCISIONAL BIOPSY Bilateral    pt unsure when- benign   BREAST SURGERY     nodules removed   COLONOSCOPY N/A 06/25/2019   Procedure: COLONOSCOPY;  Surgeon: Harvey Margo CROME, MD;  Location: [facility];  Service: Endoscopy;  Laterality: N/A;  1:30   MOLE REMOVAL Right 01/26/2022   foot (patient was put to sleep for procedure)   POLYPECTOMY  06/25/2019   Procedure: POLYPECTOMY;  Surgeon: Harvey Margo CROME, MD;  Location: [facility];  Service: Endoscopy;;   TOTAL KNEE ARTHROPLASTY Right 07/05/2022   Procedure: RIGHT TOTAL KNEE ARTHROPLASTY;  Surgeon: Jerri Kay HERO, MD;  Location: [facility] OR;  Service: Orthopedics;  Laterality: Right;   Family  History  Problem Relation Age of Onset   Thyroid  disease Mother    CVA Mother 37   Cancer Father    Colon cancer Father    Thyroid  disease Sister    Congestive Heart Failure Sister    Hypertension Sister    Diabetes Sister    Hypertension Sister    Diabetes Sister    Hypertension Sister    Hypertension Sister    Cancer Brother        kidney    Healthy Son    Thyroid  disease Niece    Breast cancer Neg Hx     Current Outpatient Medications:    Abatacept  (ORENCIA  CLICKJECT) 125 MG/ML SOAJ, Inject 125 mg into the skin once a week., Disp: 12 mL, Rfl: 0   acetaminophen  (TYLENOL ) 650 MG CR tablet, Take 1,300 mg  by mouth every 8 (eight) hours as needed for pain., Disp: , Rfl:    apixaban  (ELIQUIS ) 5 MG TABS tablet, Take 2 tablets (10 mg total) by mouth 2 (two) times daily for 7 days, THEN 1 tablet (5 mg total) 2 (two) times daily., Disp: 60 tablet, Rfl: 3   betamethasone dipropionate (DIPROLENE) 0.05 % ointment, Apply topically as needed., Disp: , Rfl:    Calcium -Magnesium-Zinc (CAL-MAG-ZINC PO), Take 1 tablet by mouth daily., Disp: , Rfl:    Ergocalciferol  (VITAMIN D2) 10 MCG (400 UNIT) TABS, Take 1 tablet by mouth daily., Disp: , Rfl:    fluticasone  (FLONASE ) 50 MCG/ACT nasal spray, USE 2 SPRAYS IN EACH NOSTRIL DAILY, Disp: 16 g, Rfl: 6   Iron , Ferrous Sulfate , 325 (65 Fe) MG TABS, Take 325 mg by mouth daily., Disp: 30 tablet, Rfl: 1   leflunomide  (ARAVA ) 20 MG tablet, TAKE ONE (1) TABLET BY MOUTH EVERY DAY, Disp: 90 tablet, Rfl: 0   losartan  (COZAAR ) 50 MG tablet, Take 1 tablet (50 mg total) by mouth daily., Disp: 90 tablet, Rfl: 0   Multiple Vitamin (MULTIVITAMIN PO), Take 1 tablet by mouth daily., Disp: , Rfl:    rosuvastatin  (CRESTOR ) 10 MG tablet, Take 1 tablet (10 mg total) by mouth daily., Disp: 90 tablet, Rfl: 3   Suvorexant  (BELSOMRA ) 10 MG TABS, Take 10 mg by mouth at bedtime as needed (sleep). Place on hold, Disp: 30 tablet, Rfl: 3   UNABLE TO FIND, Med Name: CBD roll on, as  needed, Disp: , Rfl:    VITAMIN E  PO, Take 1 capsule by mouth daily., Disp: , Rfl:    cyanocobalamin  (VITAMIN B12) 1000 MCG tablet, Take 1 tablet by mouth daily. (Patient not taking: Reported on 08/22/2024), Disp: , Rfl:    gabapentin  (NEURONTIN ) 600 MG tablet, Take 1 tablet (600 mg total) by mouth at bedtime. **dose change (Patient not taking: Reported on 08/22/2024), Disp: 90 tablet, Rfl: 3  Allergies  Allergen Reactions   Electrodes 25mm [Tens Therapy Replace Back Pads] Rash    EKG electrodes   Enbrel  [Etanercept ] Rash    Psoriasis     ROS: Review of Systems Pertinent items noted in HPI and remainder of comprehensive ROS otherwise negative.    Physical exam BP (!) 150/97   Pulse 99   Temp (!) 97 F (36.1 C)   Ht 5' 3 (1.6 m)   Wt 203 lb (92.1 kg)   SpO2 95%   BMI 35.96 kg/m  General appearance: alert, cooperative, appears stated age, no distress, and morbidly obese Head: Normocephalic, without obvious abnormality, atraumatic Eyes: negative findings: lids and lashes normal, conjunctivae and sclerae normal, corneas clear, and pupils equal, round, reactive to light and accomodation Ears: normal TM's and external ear canals both ears Nose: Nares normal. Septum midline. Mucosa normal. No drainage or sinus tenderness. Throat: lips, mucosa, and tongue normal; teeth and gums normal Neck: no adenopathy, supple, symmetrical, trachea midline, and thyroid  not enlarged, symmetric, no tenderness/mass/nodules Back: symmetric, no curvature. ROM normal. No CVA tenderness.  Arrives in wheelchair Lungs: clear to auscultation bilaterally Heart: regular rate and rhythm, S1, S2 normal, no murmur, click, rub or gallop Abdomen: soft, non-tender; bowel sounds normal; no masses,  no organomegaly Extremities: trace pitting edema in RLE. Pulses: 2+ and symmetric Skin: Skin color, texture, turgor normal. No rashes or lesions Lymph nodes: Cervical, supraclavicular, and axillary nodes  normal. Neurologic: Grossly normal      08/22/2024    3:45 PM 08/10/2024  2:36 PM 08/09/2024   12:14 PM  Depression screen PHQ 2/9  Decreased Interest 0 3 3  Down, Depressed, Hopeless 0 0 0  PHQ - 2 Score 0 3 3  Altered sleeping 0 0 0  Tired, decreased energy 3 3 3   Change in appetite 0 1 0  Feeling bad or failure about yourself  0 0 0  Trouble concentrating 0 0 0  Moving slowly or fidgety/restless 0 0 0  Suicidal thoughts 0 0 0  PHQ-9 Score 3 7 6   Difficult doing work/chores Not difficult at all Not difficult at all Not difficult at all      08/22/2024    3:46 PM 07/06/2024   12:54 PM 05/21/2024    2:24 PM 01/03/2024    2:09 PM  GAD 7 : Generalized Anxiety Score  Nervous, Anxious, on Edge 0 0 0 0  Control/stop worrying 0 0 0 0  Worry too much - different things 0 0 0 0  Trouble relaxing 0 0 0 0  Restless 0 0 0 0  Easily annoyed or irritable 0 0 0 0  Afraid - awful might happen 0 0 0 0  Total GAD 7 Score 0 0 0 0  Anxiety Difficulty Not difficult at all  Not difficult at all Not difficult at all     Assessment/ Plan: Avelina Birk here for annual physical exam.   Annual physical exam  Essential hypertension - Plan: losartan  (COZAAR ) 50 MG tablet  Pure hypercholesterolemia  Sciatica, left side - Plan: gabapentin  (NEURONTIN ) 600 MG tablet  Other acute pulmonary embolism without acute cor pulmonale (HCC)  Morbid obesity (HCC)  Assessment and Plan    Health maintenance:  - Declines vaccines.   Rheumatoid arthritis with chronic pain and left leg edema Chronic pain and left leg edema likely due to rheumatoid arthritis. Pain necessitates wheelchair use. Current treatment with leflunomide  and Orencia  reduces pain. Muscle relaxers provide limited relief. Gabapentin  discontinued due to inefficacy. Concerns about potential weakness and decreased mobility due to wheelchair reliance. Pain also present in shoulders and hands. - Continue leflunomide  and Orencia . -  Discontinue gabapentin  by tapering: take half a tablet at bedtime for 2-3 weeks, then stop. - Consider referral to a new back specialist for a second opinion on leg pain. - Discuss potential use of a collapsible wheelchair for transport. - Declined steroid shot for pain management.  Hypertension and HLD associated with morbid obesity Blood pressure elevated, likely due to pain. On antihypertensive medication. - Recheck blood pressure with nurse in 2 weeks. If persistently elevated plan to advance Losartan  to 100mg  daily. - continue crestor  - not due for FLP, CMP reviewed.  Pulmonary embolism On anticoagulation therapy for three months. Scheduled hematology evaluation on August 28, 2024, for potential clotting disorder testing. Missed recent appointment but rescheduled. Hematologist to assess for inherited clotting disorders to determine if lifelong anticoagulation is necessary. - Continue anticoagulation therapy as prescribed. - Attend hematology appointment on August 28, 2024 for further evaluation.      Counseled on healthy lifestyle choices, including diet (rich in fruits, vegetables and lean meats and low in salt and simple carbohydrates) and exercise (at least 30 minutes of moderate physical activity daily).  Patient to follow up 70m  Jerzy Roepke M. Jolinda, DO

## 2024-08-24 DIAGNOSIS — M79605 Pain in left leg: Secondary | ICD-10-CM | POA: Diagnosis not present

## 2024-08-24 DIAGNOSIS — R600 Localized edema: Secondary | ICD-10-CM | POA: Insufficient documentation

## 2024-08-24 DIAGNOSIS — Z7901 Long term (current) use of anticoagulants: Secondary | ICD-10-CM | POA: Insufficient documentation

## 2024-08-24 DIAGNOSIS — I10 Essential (primary) hypertension: Secondary | ICD-10-CM | POA: Diagnosis not present

## 2024-08-24 DIAGNOSIS — Z79899 Other long term (current) drug therapy: Secondary | ICD-10-CM | POA: Diagnosis not present

## 2024-08-24 DIAGNOSIS — Z87891 Personal history of nicotine dependence: Secondary | ICD-10-CM | POA: Diagnosis not present

## 2024-08-25 ENCOUNTER — Other Ambulatory Visit: Payer: Self-pay

## 2024-08-25 ENCOUNTER — Encounter (HOSPITAL_COMMUNITY): Payer: Self-pay | Admitting: Emergency Medicine

## 2024-08-25 ENCOUNTER — Emergency Department (HOSPITAL_COMMUNITY)

## 2024-08-25 ENCOUNTER — Emergency Department (HOSPITAL_COMMUNITY)
Admission: EM | Admit: 2024-08-25 | Discharge: 2024-08-25 | Disposition: A | Discharge status: Home / Self Care | Attending: Emergency Medicine | Admitting: Emergency Medicine

## 2024-08-25 ENCOUNTER — Telehealth: Payer: Self-pay

## 2024-08-25 DIAGNOSIS — M79605 Pain in left leg: Secondary | ICD-10-CM

## 2024-08-25 DIAGNOSIS — M25462 Effusion, left knee: Secondary | ICD-10-CM | POA: Diagnosis not present

## 2024-08-25 DIAGNOSIS — M1712 Unilateral primary osteoarthritis, left knee: Secondary | ICD-10-CM | POA: Diagnosis not present

## 2024-08-25 LAB — COMPREHENSIVE METABOLIC PANEL WITH GFR
ALT: 11 U/L (ref 0–44)
AST: 23 U/L (ref 15–41)
Albumin: 2.6 g/dL — ABNORMAL LOW (ref 3.5–5.0)
Alkaline Phosphatase: 45 U/L (ref 38–126)
Anion gap: 12 (ref 5–15)
BUN: 9 mg/dL (ref 8–23)
CO2: 22 mmol/L (ref 22–32)
Calcium: 8.5 mg/dL — ABNORMAL LOW (ref 8.9–10.3)
Chloride: 104 mmol/L (ref 98–111)
Creatinine, Ser: 0.79 mg/dL (ref 0.44–1.00)
GFR, Estimated: >60 mL/min (ref >60)
Glucose, Bld: 91 mg/dL (ref 70–99)
Potassium: 3.8 mmol/L (ref 3.5–5.1)
Sodium: 138 mmol/L (ref 135–145)
Total Bilirubin: 0.6 mg/dL (ref 0.0–1.2)
Total Protein: 6.9 g/dL (ref 6.5–8.1)

## 2024-08-25 LAB — CBC WITH DIFFERENTIAL/PLATELET
Abs Immature Granulocytes: 0.01 K/uL (ref 0.00–0.07)
Basophils Absolute: 0 K/uL (ref 0.0–0.1)
Basophils Relative: 0 %
Eosinophils Absolute: 0.1 K/uL (ref 0.0–0.5)
Eosinophils Relative: 3 %
HCT: 32.2 % — ABNORMAL LOW (ref 36.0–46.0)
Hemoglobin: 10.3 g/dL — ABNORMAL LOW (ref 12.0–15.0)
Immature Granulocytes: 0 %
Lymphocytes Relative: 28 %
Lymphs Abs: 1.2 K/uL (ref 0.7–4.0)
MCH: 27.1 pg (ref 26.0–34.0)
MCHC: 32 g/dL (ref 30.0–36.0)
MCV: 84.7 fL (ref 80.0–100.0)
Monocytes Absolute: 0.1 K/uL (ref 0.1–1.0)
Monocytes Relative: 3 %
Neutro Abs: 2.9 K/uL (ref 1.7–7.7)
Neutrophils Relative %: 66 %
Platelets: 289 K/uL (ref 150–400)
RBC: 3.8 MIL/uL — ABNORMAL LOW (ref 3.87–5.11)
RDW: 17.2 % — ABNORMAL HIGH (ref 11.5–15.5)
WBC: 4.4 K/uL (ref 4.0–10.5)
nRBC: 0 % (ref 0.0–0.2)

## 2024-08-25 LAB — BRAIN NATRIURETIC PEPTIDE: B Natriuretic Peptide: 70 pg/mL (ref 0.0–100.0)

## 2024-08-25 LAB — TROPONIN I (HIGH SENSITIVITY): Troponin I (High Sensitivity): 6 ng/L (ref <18)

## 2024-08-25 MED ORDER — OXYCODONE-ACETAMINOPHEN 5-325 MG PO TABS: 1.0000 | ORAL_TABLET | Freq: Four times a day (QID) | ORAL | 0 refills | Status: AC | PRN

## 2024-08-25 MED ORDER — OXYCODONE-ACETAMINOPHEN 5-325 MG PO TABS
2.0000 | ORAL_TABLET | Freq: Once | ORAL | Status: AC
  Administered 2024-08-25: 2 via ORAL
  Filled 2024-08-25: qty 2

## 2024-08-25 NOTE — ED Triage Notes (Signed)
 Pt with c/o L leg pain since Wednesday. Denies injury.

## 2024-08-25 NOTE — ED Provider Notes (Signed)
 Emergency Department Provider Note  TRIAGE NOTE: Pt with c/o L leg pain since Wednesday. Denies injury.   HISTORY  Chief Complaint Leg Pain   HPI Jean Howell is a 68 y.o. female with  left leg pain that began on Wednesday morning. The pain is located in the back of the left leg, particularly around the knee area, and worsens with walking. The patient has a history of a blood clot that previously traveled from the leg to the lungs and is currently on Eliquis , a blood thinner, without any missed doses. The patient denies any recent trauma, fever, or recent illnesses. The leg appears swollen, particularly around the ankle, although the baseline swelling is unclear. The patient has a history of knee replacement and is in the process of discontinuing gabapentin  due to difficulty splitting the medication. The patient lives alone and reports difficulty with mobility, requiring a wheelchair during a recent doctor's visit. History was obtained from the patient and accompanying family members.   PMH Past Medical History:  Diagnosis Date   Arthritis    Depression    Hypertension    Rheumatoid arthritis (HCC)     Home Medications Prior to Admission medications   Medication Sig Start Date End Date Taking? Authorizing Provider  oxyCODONE -acetaminophen  (PERCOCET/ROXICET) 5-325 MG tablet Take 1 tablet by mouth every 6 (six) hours as needed for severe pain (pain score 7-10). 08/25/24  Yes Sohil Timko, Selinda, MD  Abatacept  (ORENCIA  CLICKJECT) 125 MG/ML SOAJ Inject 125 mg into the skin once a week. 08/02/24   Dolphus Reiter, MD  acetaminophen  (TYLENOL ) 650 MG CR tablet Take 1,300 mg by mouth every 8 (eight) hours as needed for pain.    [provider]  apixaban  (ELIQUIS ) 5 MG TABS tablet Take 2 tablets (10 mg total) by mouth 2 (two) times daily for 7 days, THEN 1 tablet (5 mg total) 2 (two) times daily. 05/13/24 07/10/25  Shahmehdi, Seyed A, MD  betamethasone dipropionate (DIPROLENE) 0.05 %  ointment Apply topically as needed.    [provider]  Calcium -Magnesium-Zinc (CAL-MAG-ZINC PO) Take 1 tablet by mouth daily.    [provider]  cyanocobalamin  (VITAMIN B12) 1000 MCG tablet Take 1 tablet by mouth daily. Patient not taking: Reported on 08/22/2024 12/16/21   [provider]  Ergocalciferol  (VITAMIN D2) 10 MCG (400 UNIT) TABS Take 1 tablet by mouth daily. 12/16/21   [provider]  fluticasone  (FLONASE ) 50 MCG/ACT nasal spray USE 2 SPRAYS IN EACH NOSTRIL DAILY 04/18/23   Jolinda Potter M, DO  gabapentin  (NEURONTIN ) 600 MG tablet Take 0.5 tablets (300 mg total) by mouth at bedtime for 21 days. Then stop 08/22/24 09/12/24  Jolinda Potter M, DO  Iron , Ferrous Sulfate , 325 (65 Fe) MG TABS Take 325 mg by mouth daily. 07/20/24   Severa Rock HERO, FNP  leflunomide  (ARAVA ) 20 MG tablet TAKE ONE (1) TABLET BY MOUTH EVERY DAY 07/05/24   Cheryl Waddell HERO, PA-C  losartan  (COZAAR ) 50 MG tablet Take 1 tablet (50 mg total) by mouth daily. 08/22/24 08/22/25  Jolinda Potter HERO, DO  Multiple Vitamin (MULTIVITAMIN PO) Take 1 tablet by mouth daily.    [provider]  rosuvastatin  (CRESTOR ) 10 MG tablet Take 1 tablet (10 mg total) by mouth daily. 01/03/24   Jolinda Potter HERO, DO  UNABLE TO FIND Med Name: CBD roll on, as needed    [provider]  VITAMIN E  PO Take 1 capsule by mouth daily.    [provider]  Social History Social History   Tobacco Use   Smoking status: Former    Current packs/day: 0.00    Average packs/day: 1 pack/day for 15.0 years (15.0 ttl pk-yrs)    Types: Cigarettes    Start date: 13    Quit date: 2000    Years since quitting: 25.7    Passive exposure: Past   Smokeless tobacco: Never  Vaping Use   Vaping status: Never Used  Substance Use Topics   Alcohol use: Not Currently   Drug use: Never    Review of Systems: Documented in HPI ____________________________________________  PHYSICAL EXAM: VITAL  SIGNS: Triage: Blood pressure 120/75, pulse 75, temperature 98.6 F (37 C), temperature source Oral, resp. rate 16, height 5' 3 (1.6 m), weight 92 kg, SpO2 96%.  Vitals:   08/25/24 0029 08/25/24 0033 08/25/24 0245 08/25/24 0300  BP: (!) 167/76  129/75 120/75  Pulse: 88  73 75  Resp: 20  18 16   Temp:  98.6 F (37 C)    TempSrc: Oral Oral    SpO2: 96%  98% 96%  Weight:      Height:        Physical Exam Vitals and nursing note reviewed.  Constitutional:      Appearance: She is well-developed.  HENT:     Head: Normocephalic and atraumatic.  Cardiovascular:     Rate and Rhythm: Normal rate and regular rhythm.  Pulmonary:     Effort: No respiratory distress.     Breath sounds: No stridor.  Abdominal:     General: There is no distension.  Musculoskeletal:     Cervical back: Normal range of motion.     Right lower leg: Edema present.     Left lower leg: Edema (L>R) present.  Neurological:     Mental Status: She is alert.       ____________________________________________   LABS (all labs ordered are listed, but only abnormal results are displayed)  Labs Reviewed  CBC WITH DIFFERENTIAL/PLATELET - Abnormal; Notable for the following components:      Result Value   RBC 3.80 (*)    Hemoglobin 10.3 (*)    HCT 32.2 (*)    RDW 17.2 (*)    All other components within normal limits  COMPREHENSIVE METABOLIC PANEL WITH GFR - Abnormal; Notable for the following components:   Calcium  8.5 (*)    Albumin 2.6 (*)    All other components within normal limits  BRAIN NATRIURETIC PEPTIDE  TROPONIN I (HIGH SENSITIVITY)   ____________________________________________  EKG   EKG Interpretation Date/Time:    Ventricular Rate:    PR Interval:    QRS Duration:    QT Interval:    QTC Calculation:   R Axis:      Text Interpretation:          ____________________________________________  RADIOLOGY  DG Tibia/Fibula Left Result Date: 08/25/2024 EXAM: 3 VIEW(S) XRAY OF THE  LEFT TIBIA AND FIBULA 08/25/2024 02:23:58 AM COMPARISON: None available. CLINICAL HISTORY: Eval for injury. Pt with c/o L leg pain since Wednesday. Denies injury. FINDINGS: BONES AND JOINTS: No acute fracture. No focal osseous lesion. No joint dislocation. Mild degenerative changes about the knee and ankle. Small suprapatellar knee joint effusion. SOFT TISSUES: The soft tissues are unremarkable. Vascular calcifications. IMPRESSION: 1. Mild degenerative changes. 2. Small suprapatellar knee joint effusion. Electronically signed by: Pinkie Pebbles MD 08/25/2024 02:28 AM EDT RP Workstation: [id number]   DG Femur Min 2 Views Left Result Date: 08/25/2024 EXAM: 2  VIEWS XRAY OF THE LEFT FEMUR 08/25/2024 02:23:58 AM COMPARISON: None available. CLINICAL HISTORY: Pt with c/o L leg pain since Wednesday. Denies injury. FINDINGS: BONES AND JOINTS: No acute fracture. No focal osseous lesion. No joint dislocation. Degenerative changes of the knee, most prominent in the lateral compartment. SOFT TISSUES: The soft tissues are unremarkable. Vascular calcifications. IMPRESSION: 1. No acute abnormalities. Electronically signed by: Pinkie Pebbles MD 08/25/2024 02:26 AM EDT RP Workstation: [id number]   ____________________________________________  PROCEDURES  Procedure(s) performed:   Procedures ____________________________________________  INITIAL IMPRESSION / ASSESSMENT AND PLAN     Initial DDx:       deep vein thrombosis, musculoskeletal pain, arthritis, cellulitis, and lymphedema.   ED Course    Care significantly affected by the following chronic Conditions: History of blood clot requiring ongoing anticoagulation with Eliquis . The patient's current leg pain and swelling may be related to this history.  Care significantly affected by the following Social Determinants of Health: The patient lives alone, which affects mobility and access to care, necessitating consideration for home assistance and  potential rehabilitation services.  Management Medications: Pain medication administered in the ER for discomfort. Re-Evaluations/Course of Care: Pending results of x-ray and blood tests. Ultrasound to be scheduled as an outpatient. Potential for Clinical Deterioration: Yes, given the history of blood clots and current symptoms, there is a risk for potential complications if a new clot is present. High Risk of Morbidity/Mortality Consideration: Yes, potential for deep vein thrombosis, which can lead to serious complications if not addressed. Clinical Impression & Plan Primary Diagnosis: Left leg pain and swelling, rule out deep vein thrombosis       Images ordered viewed and obtained by myself. Agree with Radiology interpretation. Details in ED course.  Labs ordered reviewed by myself as detailed in ED course.  Consultations obtained/considered detailed in ED course.   Patient potential acute DVT but also could just have increased edema from her chronic DVT.  She is on Eliquis .  Without tentative new clot we will hold on Lovenox  or any other acute treatment.  She will need to return tomorrow for ultrasound sudomotor available here.  Rest of workup was reassuring.  No aggressive bone lesions, fractures or other abnormalities on her x-ray besides some small suprapatellar effusion. Doubt septic knee.   FINAL IMPRESSION Final diagnoses:  Left leg pain     Disposition A medical screening exam was performed and I feel the patient has had an appropriate workup for their chief complaint at this time and likelihood of emergent condition existing is low. They have been counseled on decision, DISCHARGE, follow up and which symptoms necessitate immediate return to the emergency department. They or their family verbally stated understanding and agreement with plan and discharged in stable condition.   ____________________________________________   NEW OUTPATIENT MEDICATIONS STARTED DURING THIS  VISIT:  Discharge Medication List as of 08/25/2024  2:57 AM     START taking these medications   Details  oxyCODONE -acetaminophen  (PERCOCET/ROXICET) 5-325 MG tablet Take 1 tablet by mouth every 6 (six) hours as needed for severe pain (pain score 7-10)., Starting Sat 08/25/2024, Print        Note:  This note was prepared with assistance of Dragon voice recognition software. Occasional wrong-word or sound-a-like substitutions may have occurred due to the inherent limitations of voice recognition software.    Lorette Mayo, MD 08/25/24 [phone]

## 2024-08-25 NOTE — Telephone Encounter (Signed)
 The patient was seen last night in the ED. Home health orders and face to face were placed. Reached out to [facility] and [facility] Accept[facility] for PT OT and Social work. They will reach out to the patient and likely start services on Monday.

## 2024-08-25 NOTE — ED Notes (Signed)
 Warm blanket given

## 2024-08-26 ENCOUNTER — Ambulatory Visit (HOSPITAL_COMMUNITY)
Admission: RE | Admit: 2024-08-26 | Discharge: 2024-08-26 | Disposition: A | Discharge status: Home / Self Care | Source: Ambulatory Visit | Attending: Emergency Medicine | Admitting: Emergency Medicine

## 2024-08-26 ENCOUNTER — Telehealth (HOSPITAL_COMMUNITY): Payer: Self-pay | Admitting: Emergency Medicine

## 2024-08-26 DIAGNOSIS — M7989 Other specified soft tissue disorders: Secondary | ICD-10-CM | POA: Diagnosis not present

## 2024-08-26 DIAGNOSIS — M79662 Pain in left lower leg: Secondary | ICD-10-CM | POA: Insufficient documentation

## 2024-08-26 DIAGNOSIS — M79605 Pain in left leg: Secondary | ICD-10-CM | POA: Diagnosis not present

## 2024-08-26 MED ORDER — OXYCODONE-ACETAMINOPHEN 5-325 MG PO TABS: 1.0000 | ORAL_TABLET | Freq: Four times a day (QID) | ORAL | 0 refills | Status: AC | PRN

## 2024-08-26 NOTE — ED Provider Notes (Signed)
 Pt came back today for an US  of her leg.  It's neg for DVT.  She has some arthritis in both her knee and her ankle.  She was given a few percocets prior to d/c and said that helped her pain.  I gave her a rx for 10 more.  She is stable for d/c.  F/u with ortho.  Return if worse.   Dean Clarity, MD 08/26/24 1130

## 2024-08-27 ENCOUNTER — Telehealth: Payer: Self-pay | Admitting: Family Medicine

## 2024-08-27 ENCOUNTER — Other Ambulatory Visit: Payer: Self-pay

## 2024-08-27 MED FILL — Oxycodone w/ Acetaminophen Tab 5-325 MG: ORAL | Qty: 6 | Status: AC

## 2024-08-27 NOTE — Telephone Encounter (Signed)
 Verbal given

## 2024-08-27 NOTE — Telephone Encounter (Signed)
 Copied from CRM [id number]. Topic: Clinical - Home Health Verbal Orders >> Aug 27, 2024  3:58 PM Antwanette L wrote: Caller/Agency: Lisa/ [facility] Callback Number: [phone] Service Requested: Physical Therapy Frequency: assessment will start tomorrow  Any new concerns about the patient? No >> Aug 27, 2024  4:01 PM Antwanette L wrote: [facility] needs the verbal order by tomorrow

## 2024-08-27 NOTE — Progress Notes (Signed)
 Specialty Pharmacy Refill Coordination Note  Jean Howell is a 68 y.o. female contacted today regarding refills of specialty medication(s) Abatacept  ([place]  [employer])   Patient requested Delivery   Delivery date: 08/30/24   Verified address: [address] [place] [address]   Medication will be filled on 09.24.25.

## 2024-08-28 ENCOUNTER — Inpatient Hospital Stay (HOSPITAL_BASED_OUTPATIENT_CLINIC_OR_DEPARTMENT_OTHER): Admitting: Oncology

## 2024-08-28 ENCOUNTER — Inpatient Hospital Stay: Attending: Oncology

## 2024-08-28 ENCOUNTER — Other Ambulatory Visit: Payer: Self-pay

## 2024-08-28 VITALS — BP 139/77 | HR 100 | Temp 97.8°F | Resp 18 | Ht 63.0 in | Wt 202.6 lb

## 2024-08-28 DIAGNOSIS — I825Z2 Chronic embolism and thrombosis of unspecified deep veins of left distal lower extremity: Secondary | ICD-10-CM | POA: Diagnosis not present

## 2024-08-28 DIAGNOSIS — I10 Essential (primary) hypertension: Secondary | ICD-10-CM | POA: Diagnosis not present

## 2024-08-28 DIAGNOSIS — Z87891 Personal history of nicotine dependence: Secondary | ICD-10-CM | POA: Diagnosis not present

## 2024-08-28 DIAGNOSIS — M0579 Rheumatoid arthritis with rheumatoid factor of multiple sites without organ or systems involvement: Secondary | ICD-10-CM | POA: Diagnosis not present

## 2024-08-28 DIAGNOSIS — Z86711 Personal history of pulmonary embolism: Secondary | ICD-10-CM | POA: Diagnosis not present

## 2024-08-28 DIAGNOSIS — Z86718 Personal history of other venous thrombosis and embolism: Secondary | ICD-10-CM | POA: Insufficient documentation

## 2024-08-28 DIAGNOSIS — Z79899 Other long term (current) drug therapy: Secondary | ICD-10-CM | POA: Diagnosis not present

## 2024-08-28 DIAGNOSIS — R600 Localized edema: Secondary | ICD-10-CM | POA: Diagnosis not present

## 2024-08-28 DIAGNOSIS — Z800 Family history of malignant neoplasm of digestive organs: Secondary | ICD-10-CM | POA: Insufficient documentation

## 2024-08-28 DIAGNOSIS — I2699 Other pulmonary embolism without acute cor pulmonale: Secondary | ICD-10-CM

## 2024-08-28 DIAGNOSIS — I951 Orthostatic hypotension: Secondary | ICD-10-CM | POA: Diagnosis not present

## 2024-08-28 DIAGNOSIS — M85851 Other specified disorders of bone density and structure, right thigh: Secondary | ICD-10-CM | POA: Diagnosis not present

## 2024-08-28 DIAGNOSIS — M79604 Pain in right leg: Secondary | ICD-10-CM | POA: Insufficient documentation

## 2024-08-28 DIAGNOSIS — M5116 Intervertebral disc disorders with radiculopathy, lumbar region: Secondary | ICD-10-CM | POA: Diagnosis not present

## 2024-08-28 DIAGNOSIS — E7800 Pure hypercholesterolemia, unspecified: Secondary | ICD-10-CM | POA: Diagnosis not present

## 2024-08-28 DIAGNOSIS — Z7901 Long term (current) use of anticoagulants: Secondary | ICD-10-CM | POA: Insufficient documentation

## 2024-08-28 DIAGNOSIS — Z96651 Presence of right artificial knee joint: Secondary | ICD-10-CM | POA: Diagnosis not present

## 2024-08-28 DIAGNOSIS — D649 Anemia, unspecified: Secondary | ICD-10-CM | POA: Diagnosis not present

## 2024-08-28 DIAGNOSIS — I82402 Acute embolism and thrombosis of unspecified deep veins of left lower extremity: Secondary | ICD-10-CM

## 2024-08-28 DIAGNOSIS — M1712 Unilateral primary osteoarthritis, left knee: Secondary | ICD-10-CM | POA: Diagnosis not present

## 2024-08-28 DIAGNOSIS — I351 Nonrheumatic aortic (valve) insufficiency: Secondary | ICD-10-CM | POA: Diagnosis not present

## 2024-08-28 LAB — CBC WITH DIFFERENTIAL (CANCER CENTER ONLY)
Abs Immature Granulocytes: 0.01 K/uL (ref 0.00–0.07)
Basophils Absolute: 0 K/uL (ref 0.0–0.1)
Basophils Relative: 0 %
Eosinophils Absolute: 0.1 K/uL (ref 0.0–0.5)
Eosinophils Relative: 3 %
HCT: 33.5 % — ABNORMAL LOW (ref 36.0–46.0)
Hemoglobin: 10.5 g/dL — ABNORMAL LOW (ref 12.0–15.0)
Immature Granulocytes: 0 %
Lymphocytes Relative: 44 %
Lymphs Abs: 1.8 K/uL (ref 0.7–4.0)
MCH: 26.5 pg (ref 26.0–34.0)
MCHC: 31.3 g/dL (ref 30.0–36.0)
MCV: 84.6 fL (ref 80.0–100.0)
Monocytes Absolute: 0.1 K/uL (ref 0.1–1.0)
Monocytes Relative: 3 %
Neutro Abs: 2 K/uL (ref 1.7–7.7)
Neutrophils Relative %: 50 %
Platelet Count: 320 K/uL (ref 150–400)
RBC: 3.96 MIL/uL (ref 3.87–5.11)
RDW: 17.2 % — ABNORMAL HIGH (ref 11.5–15.5)
WBC Count: 4 K/uL (ref 4.0–10.5)
nRBC: 0 % (ref 0.0–0.2)

## 2024-08-28 LAB — D-DIMER, QUANTITATIVE: D-Dimer, Quant: >20 ug{FEU}/mL — ABNORMAL HIGH (ref 0.00–0.50)

## 2024-08-28 MED ORDER — APIXABAN 5 MG PO TABS: 5.0000 mg | ORAL_TABLET | Freq: Two times a day (BID) | ORAL | 3 refills | Status: DC

## 2024-08-28 NOTE — Progress Notes (Signed)
 Hi Amber,   Pt has scheduled appt with Rosina Forte RN on 09/06/24. No need for reschedule closed out RS.   Thank you, Shereen [facility] Assistant Direct Dial: [phone]  Fax: [phone] Website: [email or web address]

## 2024-08-28 NOTE — Progress Notes (Signed)
 [facility]  HEMATOLOGY CLINIC PROGRESS NOTE  PATIENT NAME: Jean Howell   MR#: [id number] DOB: May 30, 1956  Patient Care Team: Jolinda Norene HERO, DO as PCP - General (Family Medicine) Stacia Diannah SQUIBB, MD as PCP - Cardiology (Cardiology) Shaaron Lamar HERO, MD as Consulting Physician (Gastroenterology) Dolphus Reiter, MD as Consulting Physician (Rheumatology) Shona Rush, MD (Dermatology) Ladora Ross Lacy Phebe, MD as Referring Physician (Optometry) Jerri Kay HERO, MD as Attending Physician (Orthopedic Surgery) Merlynn Lyle LITTIE KEN as [facility] Care Management (Licensed Clinical Social Worker) Carolynn, Tillman KATHEE georgann DESIREE Care Management Bertrum Rosina HERO, RN as [facility] Care Management  Date of visit: 08/28/2024   ASSESSMENT & PLAN:   Jean Howell is a 68 y.o. lady with a past medical history of hypertension, depression, arthritis, obesity, was referred to our service for evaluation after she was diagnosed with DVT and pulmonary embolism on 05/11/2024.     Pulmonary emboli (HCC) Incidentally found to have features concerning for PE on cardiac imaging, and was sent to ER on 05/11/2024.  She was found to have moderate amount of thrombus in the distal right main pulmonary artery extending to the lobar and segmental branches of the right upper and right lower lobes and borderline heart strain.  Ultrasound of the lower extremities showed left popliteal clot that appeared to be chronic.  She was briefly admitted.  Echocardiogram on 05/12/2024 did not show evidence of right heart strain.  She was treated initially with Lovenox  and transitioned over to Eliquis .   Possible contributing factors include reduced mobility due to osteoarthritis. She does have a family history of blood clots in niece and nephew.  On her consultation with us  on 06/18/2024, thrombophilia workup was pursued.  No evidence of prothrombin gene mutation, factor V Leiden mutation, lupus anticoagulant.  Anticardiolipin  antibody IgM was increased at 40 units/mL and IgA was indeterminate at 15 units/mL.  Beta-2  glycoprotein IgG antibody was increased at 28 units.  IgA and IgM were within normal limits.  D-dimer was increased >20.00.   Rest of the hypercoagulable workup was deferred as it can be falsely abnormal given recent thromboembolism.    She was advised to continue Eliquis  5 mg twice daily.   Given the extent of pulmonary embolism, at least 6 months of anticoagulation was recommended.  She had repeat CT angio of the chest on 07/10/2024 which showed no evidence of pulmonary embolism.  Had repeat ultrasound of the left lower extremity on 08/26/2024 in the ED and it showed no evidence of DVT.   Plan to repeat anticardiolipin antibody testing and beta-2  glycoprotein antibody testing, along with completion of rest of the thrombophilia workup on return visit.   Abnormal antiphospholipid antibody tests necessitate continued anticoagulation. She was advised to remain on anticoagulation until further instructions.  Dependent edema and pain of right lower extremity New onset swelling and pain in right lower extremity, likely dependent edema from prolonged sitting or standing. No evidence of acute DVT. Symptoms improve with leg elevation. - Advise use of compression stockings up to the knees - Recommend leg elevation to reduce swelling  Anemia, unspecified Chronic mild anemia with hemoglobin at 10.5, consistent with previous levels. Iron  supplementation ongoing. No signs of active bleeding or blood loss. - Continue current iron  supplementation with ferrous sulfate  - Re-evaluate iron  levels and complete blood count at next visit  I spent a total of 30 minutes during this encounter with the patient including review of chart and various tests results, discussions about  plan of care and coordination of care plan.  I reviewed lab results and outside records for this visit and discussed relevant results with the patient.  Diagnosis, plan of care and treatment options were also discussed in detail with the patient. Opportunity provided to ask questions and answers provided to her apparent satisfaction. Provided instructions to call our clinic with any problems, questions or concerns prior to return visit. I recommended to continue follow-up with PCP and sub-specialists. She verbalized understanding and agreed with the plan. No barriers to learning was detected.  [name], MD  08/28/2024 10:50 AM  [facility] - A DEPT OF [facility] [id number] [place] [address] Dept: [phone] Dept Fax: [phone]   CHIEF COMPLAINT/ REASON FOR VISIT:  Follow-up for history of pulmonary embolism and left lower extremity DVT, diagnosed in June 2025.  INTERVAL HISTORY:  Discussed the use of AI scribe software for clinical note transcription with the patient, who gave verbal consent to proceed.  History of Present Illness Jean Howell is a 68 year old female with a history of blood clots who presents with right leg pain and swelling.  She experiences right leg pain and swelling, which began while sitting in the waiting room. Her niece noticed swollen ankles on Sunday, prompting a hospital visit. Elevating her legs helps reduce the swelling.  She has a history of a blood clot in her left leg diagnosed in June, requiring hospitalization. A follow-up ultrasound showed no clot in the left leg, and a CT of the chest in August showed no remaining clots.  In July, blood tests showed mildly abnormal anticardiolipin and beta-2  glycoprotein antibodies.  She takes ferrous sulfate  for low hemoglobin, which was 10.5 at the last check. No symptoms of blood loss, such as blood in stool or black stool.  She does not wear compression socks. She attempted thigh-high compression socks but found them too tight and rolling down, preferring knee-high  socks.    SUMMARY OF HEMATOLOGIC HISTORY:  She was referred by her primary doctor for evaluation of blood clots.   Incidentally found to have features concerning for PE on cardiac imaging, and was sent to ER on 05/11/2024.  She was found to have moderate amount of thrombus in the distal right main pulmonary artery extending to the lobar and segmental branches of the right upper and right lower lobes and borderline heart strain.  Ultrasound of the lower extremities showed left popliteal clot that appeared to be chronic.  She was briefly admitted.  Echocardiogram on 05/12/2024 did not show evidence of right heart strain.  She was treated initially with Lovenox  and transitioned over to Eliquis .    She experienced episodes of syncope prior to the discovery of the clots. No recent surgeries or prolonged bed rest were reported prior to the clot discovery.   She has a history of arthritis, leading to decreased mobility over the past six years, and uses a walker at home due to arthritis-related mobility issues.   She is currently taking Eliquis  5 mg twice daily without missing any doses.   There is a family history of blood clots in her niece and nephew. Her father had colon cancer, and she underwent a colonoscopy in July 2020.   No recent surgeries or being on bed rest prior to the clots.  Incidentally found to have features concerning for PE on cardiac imaging, and was sent to ER on 05/11/2024.  She was  found to have moderate amount of thrombus in the distal right main pulmonary artery extending to the lobar and segmental branches of the right upper and right lower lobes and borderline heart strain.  Ultrasound of the lower extremities showed left popliteal clot that appeared to be chronic.  She was briefly admitted.  Echocardiogram on 05/12/2024 did not show evidence of right heart strain.  She was treated initially with Lovenox  and transitioned over to Eliquis .    Possible contributing factors include  reduced mobility due to osteoarthritis. She does have a family history of blood clots in niece and nephew.   On her consultation with us  on 06/18/2024, thrombophilia workup was pursued.  No evidence of prothrombin gene mutation, factor V Leiden mutation, lupus anticoagulant.  Anticardiolipin antibody IgM was increased at 40 units/mL and IgA was indeterminate at 15 units/mL.  Beta-2  glycoprotein IgG antibody was increased at 28 units.  IgA and IgM were within normal limits.  D-dimer was increased >20.00.   Rest of the hypercoagulable workup was deferred as it can be falsely abnormal given recent thromboembolism.    She was advised to continue Eliquis  5 mg twice daily.   Given the extent of pulmonary embolism, at least 6 months of anticoagulation was recommended.  She had repeat CT angio of the chest on 07/10/2024 which showed no evidence of pulmonary embolism.  Had repeat ultrasound of the left lower extremity on 08/26/2024 in the ED and it showed no evidence of DVT.   Plan to repeat anticardiolipin antibody testing and beta-2  glycoprotein antibody testing, along with completion of rest of the thrombophilia workup on return visit.  I have reviewed the past medical history, past surgical history, social history and family history with the patient and they are unchanged from previous note.  ALLERGIES: She is allergic to electrodes 25mm [tens therapy replace back pads] and enbrel  [etanercept ].  MEDICATIONS:  Current Outpatient Medications  Medication Sig Dispense Refill   Abatacept  (ORENCIA  CLICKJECT) 125 MG/ML SOAJ Inject 125 mg into the skin once a week. 12 mL 0   acetaminophen  (TYLENOL ) 650 MG CR tablet Take 1,300 mg by mouth every 8 (eight) hours as needed for pain.     apixaban  (ELIQUIS ) 5 MG TABS tablet Take 1 tablet (5 mg total) by mouth 2 (two) times daily. 60 tablet 3   betamethasone dipropionate (DIPROLENE) 0.05 % ointment Apply topically as needed.     Calcium -Magnesium-Zinc (CAL-MAG-ZINC  PO) Take 1 tablet by mouth daily.     Ergocalciferol  (VITAMIN D2) 10 MCG (400 UNIT) TABS Take 1 tablet by mouth daily.     fluticasone  (FLONASE ) 50 MCG/ACT nasal spray USE 2 SPRAYS IN EACH NOSTRIL DAILY 16 g 6   gabapentin  (NEURONTIN ) 600 MG tablet Take 0.5 tablets (300 mg total) by mouth at bedtime for 21 days. Then stop     Iron , Ferrous Sulfate , 325 (65 Fe) MG TABS Take 325 mg by mouth daily. 30 tablet 1   leflunomide  (ARAVA ) 20 MG tablet TAKE ONE (1) TABLET BY MOUTH EVERY DAY 90 tablet 0   losartan  (COZAAR ) 50 MG tablet Take 1 tablet (50 mg total) by mouth daily. 90 tablet 3   Multiple Vitamin (MULTIVITAMIN PO) Take 1 tablet by mouth daily.     oxyCODONE -acetaminophen  (PERCOCET/ROXICET) 5-325 MG tablet Take 1 tablet by mouth every 6 (six) hours as needed for severe pain (pain score 7-10). 6 tablet 0   oxyCODONE -acetaminophen  (PERCOCET/ROXICET) 5-325 MG tablet Take 1 tablet by mouth every 6 (six) hours as  needed for severe pain (pain score 7-10). 10 tablet 0   rosuvastatin  (CRESTOR ) 10 MG tablet Take 1 tablet (10 mg total) by mouth daily. 90 tablet 3   UNABLE TO FIND Med Name: CBD roll on, as needed     VITAMIN E  PO Take 1 capsule by mouth daily.     cyanocobalamin  (VITAMIN B12) 1000 MCG tablet Take 1 tablet by mouth daily. (Patient not taking: Reported on 08/28/2024)     No current facility-administered medications for this visit.     REVIEW OF SYSTEMS:    Review of Systems - Oncology  All other pertinent systems were reviewed with the patient and are negative.  PHYSICAL EXAMINATION:   Onc Performance Status - 08/28/24 0920       ECOG Perf Status   ECOG Perf Status Ambulatory and capable of all selfcare but unable to carry out any work activities.  Up and about more than 50% of waking hours      KPS SCALE   KPS % SCORE Requires occasional assistance but is able to care for most needs          Vitals:   08/28/24 0915  BP: 139/77  Pulse: 100  Resp: 18  Temp: 97.8 F (36.6  C)  SpO2: 98%   Filed Weights   08/28/24 0915  Weight: 202 lb 9.6 oz (91.9 kg)    Physical Exam Constitutional:      General: She is not in acute distress.    Appearance: Normal appearance.  HENT:     Head: Normocephalic and atraumatic.  Cardiovascular:     Rate and Rhythm: Normal rate.  Pulmonary:     Effort: Pulmonary effort is normal. No respiratory distress.  Abdominal:     General: There is no distension.  Musculoskeletal:     Right lower leg: Edema present.     Left lower leg: Edema present.  Neurological:     General: No focal deficit present.     Mental Status: She is alert and oriented to person, place, and time.  Psychiatric:        Mood and Affect: Mood normal.        Behavior: Behavior normal.     LABORATORY DATA:   I have reviewed the data as listed.  Results for orders placed or performed in visit on 08/28/24  D-dimer, quantitative  Result Value Ref Range   D-Dimer, Quant >20.00 (H) 0.00 - 0.50 ug/mL-FEU  CBC with Differential (Cancer Center Only)  Result Value Ref Range   WBC Count 4.0 4.0 - 10.5 K/uL   RBC 3.96 3.87 - 5.11 MIL/uL   Hemoglobin 10.5 (L) 12.0 - 15.0 g/dL   HCT 66.4 (L) 63.9 - 53.9 %   MCV 84.6 80.0 - 100.0 fL   MCH 26.5 26.0 - 34.0 pg   MCHC 31.3 30.0 - 36.0 g/dL   RDW 82.7 (H) 88.4 - 84.4 %   Platelet Count 320 150 - 400 K/uL   nRBC 0.0 0.0 - 0.2 %   Neutrophils Relative % 50 %   Neutro Abs 2.0 1.7 - 7.7 K/uL   Lymphocytes Relative 44 %   Lymphs Abs 1.8 0.7 - 4.0 K/uL   Monocytes Relative 3 %   Monocytes Absolute 0.1 0.1 - 1.0 K/uL   Eosinophils Relative 3 %   Eosinophils Absolute 0.1 0.0 - 0.5 K/uL   Basophils Relative 0 %   Basophils Absolute 0.0 0.0 - 0.1 K/uL   Immature Granulocytes 0 %  Abs Immature Granulocytes 0.01 0.00 - 0.07 K/uL    RADIOGRAPHIC STUDIES:  I have personally reviewed the radiological images as listed and agree with the findings in the report.  US  Venous Img Lower Unilateral Left Result  Date: 08/26/2024 CLINICAL DATA:  Left leg pain and swelling EXAM: Left LOWER EXTREMITY VENOUS DOPPLER ULTRASOUND TECHNIQUE: Gray-scale sonography with compression, as well as color and duplex ultrasound, were performed to evaluate the deep venous system(s) from the level of the common femoral vein through the popliteal and proximal calf veins. COMPARISON:  None Available. FINDINGS: VENOUS Normal compressibility of the common femoral, superficial femoral, and popliteal veins, as well as the visualized calf veins. Visualized portions of profunda femoral vein and great saphenous vein unremarkable. No filling defects to suggest DVT on grayscale or color Doppler imaging. Doppler waveforms show normal direction of venous flow, normal respiratory plasticity and response to augmentation. Limited views of the contralateral common femoral vein are unremarkable. OTHER None. Limitations: none IMPRESSION: Negative for left lower extremity DVT. Electronically Signed   By: Cordella Banner   On: 08/26/2024 10:59   DG Tibia/Fibula Left Result Date: 08/25/2024 EXAM: 3 VIEW(S) XRAY OF THE LEFT TIBIA AND FIBULA 08/25/2024 02:23:58 AM COMPARISON: None available. CLINICAL HISTORY: Eval for injury. Pt with c/o L leg pain since Wednesday. Denies injury. FINDINGS: BONES AND JOINTS: No acute fracture. No focal osseous lesion. No joint dislocation. Mild degenerative changes about the knee and ankle. Small suprapatellar knee joint effusion. SOFT TISSUES: The soft tissues are unremarkable. Vascular calcifications. IMPRESSION: 1. Mild degenerative changes. 2. Small suprapatellar knee joint effusion. Electronically signed by: Pinkie Pebbles MD 08/25/2024 02:28 AM EDT RP Workstation: [id number]   DG Femur Min 2 Views Left Result Date: 08/25/2024 EXAM: 2 VIEWS XRAY OF THE LEFT FEMUR 08/25/2024 02:23:58 AM COMPARISON: None available. CLINICAL HISTORY: Pt with c/o L leg pain since Wednesday. Denies injury. FINDINGS: BONES AND JOINTS: No  acute fracture. No focal osseous lesion. No joint dislocation. Degenerative changes of the knee, most prominent in the lateral compartment. SOFT TISSUES: The soft tissues are unremarkable. Vascular calcifications. IMPRESSION: 1. No acute abnormalities. Electronically signed by: Pinkie Pebbles MD 08/25/2024 02:26 AM EDT RP Workstation: [id number]    Orders Placed This Encounter  Procedures   CBC with Differential (Cancer Center Only)    Standing Status:   Future    Expected Date:   11/13/2024    Expiration Date:   02/11/2025   D-dimer, quantitative    Standing Status:   Future    Expected Date:   11/13/2024    Expiration Date:   02/11/2025   Cardiolipin antibodies, IgG, IgM, IgA    Standing Status:   Future    Expected Date:   11/13/2024    Expiration Date:   02/11/2025   Beta-2 -glycoprotein i abs, IgG/M/A    Standing Status:   Future    Expected Date:   11/13/2024    Expiration Date:   02/11/2025   Protein C activity    Standing Status:   Future    Expected Date:   11/13/2024    Expiration Date:   02/11/2025   PROTEIN S PANEL    Standing Status:   Future    Expected Date:   11/13/2024    Expiration Date:   02/11/2025   Antithrombin panel    Standing Status:   Future    Expected Date:   11/13/2024    Expiration Date:   02/11/2025   Iron  and TIBC  Standing Status:   Future    Expected Date:   11/13/2024    Expiration Date:   02/11/2025   Ferritin    Standing Status:   Future    Expected Date:   11/13/2024    Expiration Date:   02/11/2025     Future Appointments  Date Time Provider Department Center  09/05/2024 11:00 AM [facility] CLINICAL SUPPORT [facility] [address]  09/06/2024 11:00 AM [facility] Pool, NP [facility] [address]  09/10/2024 11:45 AM Bertrum Rosina HERO, RN [facility] None  09/11/2024 11:00 AM Merlynn Lyle CROME, LCSW [facility] None  09/13/2024  2:00 PM Dolphus Reiter, MD [facility] None  11/13/2024  9:15 AM [facility] PHLEBOTOMIST [facility] None  11/13/2024  9:30 AM Autumn Millman, MD [facility] None  02/19/2025 11:00 AM Jolinda Norene HERO, DO [facility] [address]  06/26/2025  8:40 AM [facility]ANNUAL WELLNESS VISIT [facility] [address]     This document was completed utilizing Engineer, civil (consulting). Grammatical errors, random word insertions, pronoun errors, and incomplete sentences are an occasional consequence of this system due to software limitations, ambient noise, and hardware issues. Any formal questions or concerns about the content, text or information contained within the body of this dictation should be directly addressed to the provider for clarification.

## 2024-08-29 DIAGNOSIS — Z7901 Long term (current) use of anticoagulants: Secondary | ICD-10-CM | POA: Diagnosis not present

## 2024-08-29 DIAGNOSIS — E7800 Pure hypercholesterolemia, unspecified: Secondary | ICD-10-CM | POA: Diagnosis not present

## 2024-08-29 DIAGNOSIS — Z79899 Other long term (current) drug therapy: Secondary | ICD-10-CM | POA: Diagnosis not present

## 2024-08-29 DIAGNOSIS — Z87891 Personal history of nicotine dependence: Secondary | ICD-10-CM | POA: Diagnosis not present

## 2024-08-29 DIAGNOSIS — Z96651 Presence of right artificial knee joint: Secondary | ICD-10-CM | POA: Diagnosis not present

## 2024-08-29 DIAGNOSIS — I351 Nonrheumatic aortic (valve) insufficiency: Secondary | ICD-10-CM | POA: Diagnosis not present

## 2024-08-29 DIAGNOSIS — I951 Orthostatic hypotension: Secondary | ICD-10-CM | POA: Diagnosis not present

## 2024-08-29 DIAGNOSIS — M85851 Other specified disorders of bone density and structure, right thigh: Secondary | ICD-10-CM | POA: Diagnosis not present

## 2024-08-29 DIAGNOSIS — M1712 Unilateral primary osteoarthritis, left knee: Secondary | ICD-10-CM | POA: Diagnosis not present

## 2024-08-29 DIAGNOSIS — M0579 Rheumatoid arthritis with rheumatoid factor of multiple sites without organ or systems involvement: Secondary | ICD-10-CM | POA: Diagnosis not present

## 2024-08-29 DIAGNOSIS — M5116 Intervertebral disc disorders with radiculopathy, lumbar region: Secondary | ICD-10-CM | POA: Diagnosis not present

## 2024-08-29 DIAGNOSIS — Z86718 Personal history of other venous thrombosis and embolism: Secondary | ICD-10-CM | POA: Diagnosis not present

## 2024-08-29 DIAGNOSIS — I10 Essential (primary) hypertension: Secondary | ICD-10-CM | POA: Diagnosis not present

## 2024-08-29 DIAGNOSIS — I2699 Other pulmonary embolism without acute cor pulmonale: Secondary | ICD-10-CM | POA: Diagnosis not present

## 2024-08-30 ENCOUNTER — Other Ambulatory Visit (HOSPITAL_COMMUNITY): Payer: Self-pay

## 2024-08-31 DIAGNOSIS — Z86718 Personal history of other venous thrombosis and embolism: Secondary | ICD-10-CM | POA: Diagnosis not present

## 2024-08-31 DIAGNOSIS — M5116 Intervertebral disc disorders with radiculopathy, lumbar region: Secondary | ICD-10-CM | POA: Diagnosis not present

## 2024-08-31 DIAGNOSIS — Z79899 Other long term (current) drug therapy: Secondary | ICD-10-CM | POA: Diagnosis not present

## 2024-08-31 DIAGNOSIS — Z87891 Personal history of nicotine dependence: Secondary | ICD-10-CM | POA: Diagnosis not present

## 2024-08-31 DIAGNOSIS — Z7901 Long term (current) use of anticoagulants: Secondary | ICD-10-CM | POA: Diagnosis not present

## 2024-08-31 DIAGNOSIS — I951 Orthostatic hypotension: Secondary | ICD-10-CM | POA: Diagnosis not present

## 2024-08-31 DIAGNOSIS — M0579 Rheumatoid arthritis with rheumatoid factor of multiple sites without organ or systems involvement: Secondary | ICD-10-CM | POA: Diagnosis not present

## 2024-08-31 DIAGNOSIS — I351 Nonrheumatic aortic (valve) insufficiency: Secondary | ICD-10-CM | POA: Diagnosis not present

## 2024-08-31 DIAGNOSIS — E7800 Pure hypercholesterolemia, unspecified: Secondary | ICD-10-CM | POA: Diagnosis not present

## 2024-08-31 DIAGNOSIS — M1712 Unilateral primary osteoarthritis, left knee: Secondary | ICD-10-CM | POA: Diagnosis not present

## 2024-08-31 DIAGNOSIS — M85851 Other specified disorders of bone density and structure, right thigh: Secondary | ICD-10-CM | POA: Diagnosis not present

## 2024-08-31 DIAGNOSIS — Z96651 Presence of right artificial knee joint: Secondary | ICD-10-CM | POA: Diagnosis not present

## 2024-08-31 DIAGNOSIS — I10 Essential (primary) hypertension: Secondary | ICD-10-CM | POA: Diagnosis not present

## 2024-08-31 DIAGNOSIS — I2699 Other pulmonary embolism without acute cor pulmonale: Secondary | ICD-10-CM | POA: Diagnosis not present

## 2024-09-03 DIAGNOSIS — I10 Essential (primary) hypertension: Secondary | ICD-10-CM | POA: Diagnosis not present

## 2024-09-03 DIAGNOSIS — Z86718 Personal history of other venous thrombosis and embolism: Secondary | ICD-10-CM | POA: Diagnosis not present

## 2024-09-03 DIAGNOSIS — Z87891 Personal history of nicotine dependence: Secondary | ICD-10-CM | POA: Diagnosis not present

## 2024-09-03 DIAGNOSIS — M5116 Intervertebral disc disorders with radiculopathy, lumbar region: Secondary | ICD-10-CM | POA: Diagnosis not present

## 2024-09-03 DIAGNOSIS — M1712 Unilateral primary osteoarthritis, left knee: Secondary | ICD-10-CM | POA: Diagnosis not present

## 2024-09-03 DIAGNOSIS — Z7901 Long term (current) use of anticoagulants: Secondary | ICD-10-CM | POA: Diagnosis not present

## 2024-09-03 DIAGNOSIS — I2699 Other pulmonary embolism without acute cor pulmonale: Secondary | ICD-10-CM | POA: Diagnosis not present

## 2024-09-03 DIAGNOSIS — I351 Nonrheumatic aortic (valve) insufficiency: Secondary | ICD-10-CM | POA: Diagnosis not present

## 2024-09-03 DIAGNOSIS — M0579 Rheumatoid arthritis with rheumatoid factor of multiple sites without organ or systems involvement: Secondary | ICD-10-CM | POA: Diagnosis not present

## 2024-09-03 DIAGNOSIS — Z79899 Other long term (current) drug therapy: Secondary | ICD-10-CM | POA: Diagnosis not present

## 2024-09-03 DIAGNOSIS — Z96651 Presence of right artificial knee joint: Secondary | ICD-10-CM | POA: Diagnosis not present

## 2024-09-03 DIAGNOSIS — I951 Orthostatic hypotension: Secondary | ICD-10-CM | POA: Diagnosis not present

## 2024-09-03 DIAGNOSIS — M85851 Other specified disorders of bone density and structure, right thigh: Secondary | ICD-10-CM | POA: Diagnosis not present

## 2024-09-03 DIAGNOSIS — E7800 Pure hypercholesterolemia, unspecified: Secondary | ICD-10-CM | POA: Diagnosis not present

## 2024-09-03 NOTE — Progress Notes (Signed)
 Office Visit Note  Patient: Jean Howell             Date of Birth: 25-Jun-1956           MRN: [id number]             PCP: Jolinda Norene HERO, DO Referring: Jolinda Norene HERO, DO Visit Date: 09/13/2024 Occupation: Data Unavailable  Subjective:  Pain in both knees and ankles   History of Present Illness: Jean Howell is a 68 y.o. female with seropositive rheumatoid arthritis and osteoarthritis.  She returns today after her last visit in April 2025.  She states she has been having increased pain in her shoulders, wrists, hands knee joints and her ankles.  She reports swelling in her ankles and her knees.  It continues to be on the Orencia  125 mg subcu weekly with leflunomide  20 mg p.o. daily.  She denies any interruption in the treatment.  She has been tolerating medication without any side effects.    Activities of Daily Living:  Patient reports morning stiffness for 24 hours.   Patient Reports nocturnal pain.  Difficulty dressing/grooming: Reports Difficulty climbing stairs: Reports Difficulty getting out of chair: Reports Difficulty using hands for taps, buttons, cutlery, and/or writing: Reports  Review of Systems  Constitutional:  Negative for fatigue.  HENT:  Negative for mouth sores and mouth dryness.   Eyes:  Negative for dryness.  Respiratory:  Positive for shortness of breath.   Cardiovascular:  Negative for chest pain and palpitations.  Gastrointestinal:  Negative for blood in stool, constipation and diarrhea.  Endocrine: Negative for increased urination.  Genitourinary:  Negative for involuntary urination.  Musculoskeletal:  Positive for joint pain, joint pain, joint swelling, myalgias, morning stiffness and myalgias. Negative for gait problem, muscle weakness and muscle tenderness.  Skin:  Positive for hair loss. Negative for color change, rash and sensitivity to sunlight.  Allergic/Immunologic: Negative for susceptible to infections.  Neurological:  Negative  for dizziness and headaches.  Hematological:  Negative for swollen glands.  Psychiatric/Behavioral:  Negative for depressed mood and sleep disturbance. The patient is not nervous/anxious.     PMFS History:  Patient Active Problem List   Diagnosis Date Noted   Left leg pain 09/06/2024   Hospital discharge follow-up 09/06/2024   Orthostatic hypotension 07/10/2024   Postural dizziness with presyncope 07/10/2024   History of pulmonary embolism 07/10/2024   History of DVT (deep vein thrombosis) 07/10/2024   Generalized anxiety disorder 07/10/2024   Chronic sciatica 07/10/2024   Healthcare maintenance 06/19/2024   DVT (deep venous thrombosis) (HCC) 05/12/2024   Pulmonary emboli (HCC) 05/11/2024   Mild aortic regurgitation 03/15/2024   HLD (hyperlipidemia) 12/27/2022   Other intervertebral disc degeneration, lumbar region 11/18/2022   History of total right knee replacement 07/05/2022   Osteopenia of neck of right femur 03/16/2022   Impaired mobility and endurance 06/16/2020   Sciatica, left side 06/16/2020   Rheumatoid arthritis involving multiple sites with positive rheumatoid factor (HCC) 03/06/2019   Degenerative arthritis of knee, bilateral 03/06/2019   Former smoker 03/06/2019   ANA positive 02/12/2019   Morbid obesity (HCC) 01/08/2019   Essential hypertension 01/08/2019    Past Medical History:  Diagnosis Date   Arthritis    Depression    Hypertension    Rheumatoid arthritis (HCC)     Family History  Problem Relation Age of Onset   Thyroid  disease Mother    CVA Mother 36   Cancer Father    Colon cancer Father  Thyroid  disease Sister    Congestive Heart Failure Sister    Hypertension Sister    Diabetes Sister    Hypertension Sister    Diabetes Sister    Hypertension Sister    Hypertension Sister    Cancer Brother        kidney    Healthy Son    Thyroid  disease Niece    Breast cancer Neg Hx    Past Surgical History:  Procedure Laterality Date   BREAST  EXCISIONAL BIOPSY Bilateral    pt unsure when- benign   BREAST SURGERY     nodules removed   COLONOSCOPY N/A 06/25/2019   Procedure: COLONOSCOPY;  Surgeon: Harvey Margo CROME, MD;  [facility];  Service: Endoscopy;  Laterality: N/A;  1:30   MOLE REMOVAL Right 01/26/2022   foot (patient was put to sleep for procedure)   POLYPECTOMY  06/25/2019   Procedure: POLYPECTOMY;  Surgeon: Harvey Margo CROME, MD;  Location: [facility];  Service: Endoscopy;;   TOTAL KNEE ARTHROPLASTY Right 07/05/2022   Procedure: RIGHT TOTAL KNEE ARTHROPLASTY;  Surgeon: Jerri Kay HERO, MD;  Location: [facility];  Service: Orthopedics;  Laterality: Right;   Social History   Tobacco Use   Smoking status: Former    Current packs/day: 0.00    Average packs/day: 1 pack/day for 15.0 years (15.0 ttl pk-yrs)    Types: Cigarettes    Start date: 14    Quit date: 2000    Years since quitting: 25.7    Passive exposure: Past   Smokeless tobacco: Never  Vaping Use   Vaping status: Never Used  Substance Use Topics   Alcohol use: Not Currently   Drug use: Never   Social History   Social History Narrative   Patient is a widow that resides independently in [place].  She has 1 son, who resides in [place].     Immunization History  Administered Date(s) Administered   Fluad Quad(high Dose 65+) 09/16/2020, 09/07/2022   Influenza, Quadrivalent, Recombinant, Inj, Pf 09/19/2019   PFIZER(Purple Top)SARS-COV-2 Vaccination 05/23/2020, 06/13/2020, 07/31/2020, 02/28/2021   PNEUMOCOCCAL CONJUGATE-20 07/07/2022     Objective: Vital Signs: BP (!) 138/91   Pulse 94   Temp 97.8 F (36.6 C)   Resp 16   Ht 5' 3 (1.6 m)   Wt 200 lb 3.2 oz (90.8 kg)   BMI 35.46 kg/m    Physical Exam Vitals and nursing note reviewed.  Constitutional:      Appearance: She is well-developed.  HENT:     Head: Normocephalic and atraumatic.  Eyes:     Conjunctiva/sclera: Conjunctivae normal.  Cardiovascular:     Rate and  Rhythm: Normal rate and regular rhythm.     Heart sounds: Normal heart sounds.  Pulmonary:     Effort: Pulmonary effort is normal.     Breath sounds: Normal breath sounds.  Abdominal:     General: Bowel sounds are normal.     Palpations: Abdomen is soft.  Musculoskeletal:     Cervical back: Normal range of motion.     Right lower leg: Edema present.     Left lower leg: Edema present.  Lymphadenopathy:     Cervical: No cervical adenopathy.  Skin:    General: Skin is warm and dry.     Capillary Refill: Capillary refill takes less than 2 seconds.  Neurological:     Mental Status: She is alert and oriented to person, place, and time.  Psychiatric:  Behavior: Behavior normal.      Musculoskeletal Exam: Patient was examined in the seated position.  She ambulates with the help of a walker.  She had limited range of motion of the cervical spine.  Thoracic and lumbar spine range of motion could not be assessed.  She had no tenderness over thoracic or lumbar spine.  Shoulder abduction was limited to about 70 degrees bilaterally.  Elbow joints with good range of motion with no synovitis.  Wrist joints, MCPs PIPs and DIPs with good range of motion with no synovitis.  She had thickening of MCP joints.  Hip joints could not be assessed.  Right knee joint was replaced.  Left knee joint was in good range of motion without any warmth swelling or effusion.  She had bilateral pedal edema.  There was no tenderness over ankles or MTPs.   CDAI Exam: CDAI Score: 12  Patient Global: 30 / 100; Provider Global: 30 / 100 Swollen: 0 ; Tender: 8  Joint Exam 09/13/2024      Right  Left  Glenohumeral   Tender   Tender  Wrist   Tender   Tender  Knee   Tender   Tender  Ankle   Tender   Tender     Investigation: No additional findings.  Imaging: US  Venous Img Lower Unilateral Left Result Date: 08/26/2024 CLINICAL DATA:  Left leg pain and swelling EXAM: Left LOWER EXTREMITY VENOUS DOPPLER ULTRASOUND  TECHNIQUE: Gray-scale sonography with compression, as well as color and duplex ultrasound, were performed to evaluate the deep venous system(s) from the level of the common femoral vein through the popliteal and proximal calf veins. COMPARISON:  None Available. FINDINGS: VENOUS Normal compressibility of the common femoral, superficial femoral, and popliteal veins, as well as the visualized calf veins. Visualized portions of profunda femoral vein and great saphenous vein unremarkable. No filling defects to suggest DVT on grayscale or color Doppler imaging. Doppler waveforms show normal direction of venous flow, normal respiratory plasticity and response to augmentation. Limited views of the contralateral common femoral vein are unremarkable. OTHER None. Limitations: none IMPRESSION: Negative for left lower extremity DVT. Electronically Signed   By: Cordella Banner   On: 08/26/2024 10:59   DG Tibia/Fibula Left Result Date: 08/25/2024 EXAM: 3 VIEW(S) XRAY OF THE LEFT TIBIA AND FIBULA 08/25/2024 02:23:58 AM COMPARISON: None available. CLINICAL HISTORY: Eval for injury. Pt with c/o L leg pain since Wednesday. Denies injury. FINDINGS: BONES AND JOINTS: No acute fracture. No focal osseous lesion. No joint dislocation. Mild degenerative changes about the knee and ankle. Small suprapatellar knee joint effusion. SOFT TISSUES: The soft tissues are unremarkable. Vascular calcifications. IMPRESSION: 1. Mild degenerative changes. 2. Small suprapatellar knee joint effusion. Electronically signed by: Pinkie Pebbles MD 08/25/2024 02:28 AM EDT RP Workstation: [id number]   DG Femur Min 2 Views Left Result Date: 08/25/2024 EXAM: 2 VIEWS XRAY OF THE LEFT FEMUR 08/25/2024 02:23:58 AM COMPARISON: None available. CLINICAL HISTORY: Pt with c/o L leg pain since Wednesday. Denies injury. FINDINGS: BONES AND JOINTS: No acute fracture. No focal osseous lesion. No joint dislocation. Degenerative changes of the knee, most prominent in  the lateral compartment. SOFT TISSUES: The soft tissues are unremarkable. Vascular calcifications. IMPRESSION: 1. No acute abnormalities. Electronically signed by: Pinkie Pebbles MD 08/25/2024 02:26 AM EDT RP Workstation: [id number]    Recent Labs: Lab Results  Component Value Date   WBC 4.0 08/28/2024   HGB 10.5 (L) 08/28/2024   PLT 320 08/28/2024   NA 138 08/25/2024  K 3.8 08/25/2024   CL 104 08/25/2024   CO2 22 08/25/2024   GLUCOSE 91 08/25/2024   BUN 9 08/25/2024   CREATININE 0.79 08/25/2024   BILITOT 0.6 08/25/2024   ALKPHOS 45 08/25/2024   AST 23 08/25/2024   ALT 11 08/25/2024   PROT 6.9 08/25/2024   ALBUMIN 2.6 (L) 08/25/2024   CALCIUM  8.5 (L) 08/25/2024   GFRAA 88 01/07/2021   QFTBGOLDPLUS Negative 07/27/2024    Speciality Comments: PLQ Eye Exam: 12/16/2018 WNL @ Dr Ross Daniels Follow up in 1 year Humira  10/20-11/22 Enbrel  11/22-8/24-psoriasis Orencia  08/04/23  Procedures:  No procedures performed Allergies: Electrodes 25mm [tens therapy replace back pads] and Enbrel  [etanercept ]   Assessment / Plan:     Visit Diagnoses: Rheumatoid arthritis involving multiple sites with positive rheumatoid factor (HCC) - Positive RF, +14-3-3 eta, Positive ANA, erosive inflammatory: Patient complains of pain and discomfort in multiple joints.  She complains of discomfort in her shoulders, wrists, hands, knees, ankles.  No synovitis was noted on the examination.  She continues to have limited range of motion of her shoulders.  She has synovial thickening over her MCP joints.  No change in treatment was advised.  High risk medication use - Orencia  125mg  sq injections once weekly since 08/04/23, Arava  20 mg 1 tablet by mouth daily. (previously on enbrel ).  Enbrel  was discontinued due to psoriasis.  August 25, 2024 CBC and CMP were normal.  Hemoglobin was 10.3 and calcium  was low at 8.5.  She was advised to take calcium  on a regular basis.  Multivitamin with iron  was advised.  Will recheck  labs in December.  She was advised to get repeat labs every 3 months.  Information reimmunization was placed in the AVS.  She was advised to hold Orencia  and Arava  if she develops an infection resume after infection resolves.  Primary osteoarthritis of left knee - Previous x-rays were consistent with moderate osteoarthritis.  She continues to have discomfort in her knee joints.  S/P TKR (total knee replacement), right - Performed by Dr. Jerri on 07/05/22.  Still in discomfort.  Chronic midline low back pain without sciatica-she had lower back pain without any tenderness.  Gait instability-she ambulates with the help of a walker.  ANA positive - No clinical features of systemic lupus at this time.  Other medical problems listed as follows:  Other fatigue  History of depression  Essential hypertension-blood pressure was elevated at 138/91.  Repeat blood pressure was 149/87.  She was advised to monitor blood pressure closely and follow-up with her PCP.  Former smoker  Family history of colon cancer  Family history of thyroid  disease  Orders: No orders of the defined types were placed in this encounter.  No orders of the defined types were placed in this encounter.    Follow-Up Instructions: Return in about 5 months (around 02/11/2025) for Rheumatoid arthritis.   Maya Nash, MD  Note - This record has been created using Animal nutritionist.  Chart creation errors have been sought, but may not always  have been located. Such creation errors do not reflect on  the standard of medical care.

## 2024-09-05 ENCOUNTER — Ambulatory Visit (INDEPENDENT_AMBULATORY_CARE_PROVIDER_SITE_OTHER): Admitting: *Deleted

## 2024-09-05 DIAGNOSIS — I10 Essential (primary) hypertension: Secondary | ICD-10-CM

## 2024-09-05 NOTE — Progress Notes (Signed)
 Patient is in office today for a nurse visit for 2 WEEK BP CHECK.   BP = 122/80

## 2024-09-05 NOTE — Progress Notes (Unsigned)
 Subjective:  Patient ID: Jean Howell, female    DOB: 05-Aug-1956, 68 y.o.   MRN: [id number]  Patient Care Team: Jolinda Norene HERO, DO as PCP - General (Family Medicine) Stacia Diannah SQUIBB, MD as PCP - Cardiology (Cardiology) Shaaron Lamar HERO, MD as Consulting Physician (Gastroenterology) Dolphus Reiter, MD as Consulting Physician (Rheumatology) Shona Rush, MD (Dermatology) Ladora Ross Lacy Phebe, MD as Referring Physician (Optometry) Jerri Kay HERO, MD as Attending Physician (Orthopedic Surgery) Merlynn Lyle LITTIE KEN as [facility] Care Management (Licensed Clinical Social Worker) Carolynn, Tillman KATHEE georgann DESIREE Care Management Bertrum Rosina HERO, RN as [facility] Care Management   Chief Complaint:  No chief complaint on file.   HPI: Jean Howell is a 68 y.o. female presenting on 09/06/2024 for No chief complaint on file.   Discussed the use of AI scribe software for clinical note transcription with the patient, who gave verbal consent to proceed.  History of Present Illness   US  Left lower Extremity 08/26/2024  Left lower extremity venous Doppler ultrasound shows no evidence of deep vein thrombosis (DVT). Normal compressibility and flow noted throughout evaluated veins.  Left Tibia/Fibula X-ray (3 views, 08/25/24): No acute fracture, dislocation, or focal bone lesion. Mild degenerative changes noted at the knee and ankle. Small suprapatellar knee joint effusion present. Soft tissues unremarkable aside from vascular calcifications.   X-ray of the left femur (2 views, 08/25/24):  No acute fracture, dislocation, or bone lesion. Degenerative changes noted in the left knee, most prominent in the lateral compartment. Soft tissues appear normal. Vascular calcifications present.     Relevant past medical, surgical, family, and social history reviewed and updated as indicated.  Allergies and medications reviewed and updated. Data reviewed: Chart in Epic.   Past Medical History:  Diagnosis Date    Arthritis    Depression    Hypertension    Rheumatoid arthritis (HCC)     Past Surgical History:  Procedure Laterality Date   BREAST EXCISIONAL BIOPSY Bilateral    pt unsure when- benign   BREAST SURGERY     nodules removed   COLONOSCOPY N/A 06/25/2019   Procedure: COLONOSCOPY;  Surgeon: Harvey Margo LITTIE, MD;  [facility];  Service: Endoscopy;  Laterality: N/A;  1:30   MOLE REMOVAL Right 01/26/2022   foot (patient was put to sleep for procedure)   POLYPECTOMY  06/25/2019   Procedure: POLYPECTOMY;  Surgeon: Harvey Margo LITTIE, MD;  [facility];  Service: Endoscopy;;   TOTAL KNEE ARTHROPLASTY Right 07/05/2022   Procedure: RIGHT TOTAL KNEE ARTHROPLASTY;  Surgeon: Jerri Kay HERO, MD;  Location: [facility];  Service: Orthopedics;  Laterality: Right;    Social History   Socioeconomic History   Marital status: Widowed    Spouse name: Not on file   Number of children: 1   Years of education: 12th   Highest education level: GED or equivalent  Occupational History   Occupation: retired  Tobacco Use   Smoking status: Former    Current packs/day: 0.00    Average packs/day: 1 pack/day for 15.0 years (15.0 ttl pk-yrs)    Types: Cigarettes    Start date: 17    Quit date: 2000    Years since quitting: 25.7    Passive exposure: Past   Smokeless tobacco: Never  Vaping Use   Vaping status: Never Used  Substance and Sexual Activity   Alcohol use: Not Currently   Drug use: Never   Sexual activity: Not Currently  Comment: widowed  Other Topics Concern   Not on file  Social History Narrative   Patient is a widow that resides independently in [place].  She has 1 son, who resides in [place].   Social Drivers of Corporate investment banker Strain: Low Risk  (07/13/2024)   Overall Financial Resource Strain (CARDIA)    Difficulty of Paying Living Expenses: Not very hard  Recent Concern: Financial Resource Strain - Medium Risk (05/16/2024)   Overall Financial  Resource Strain (CARDIA)    Difficulty of Paying Living Expenses: Somewhat hard  Food Insecurity: No Food Insecurity (08/10/2024)   Hunger Vital Sign    Worried About Running Out of Food in the Last Year: Never true    Ran Out of Food in the Last Year: Never true  Recent Concern: Food Insecurity - Food Insecurity Present (07/11/2024)   Hunger Vital Sign    Worried About Running Out of Food in the Last Year: Sometimes true    Ran Out of Food in the Last Year: Never true  Transportation Needs: No Transportation Needs (08/10/2024)   PRAPARE - Administrator, Civil Service (Medical): No    Lack of Transportation (Non-Medical): No  Physical Activity: Insufficiently Active (06/25/2024)   Exercise Vital Sign    Days of Exercise per Week: 7 days    Minutes of Exercise per Session: 10 min  Stress: No Stress Concern Present (08/10/2024)   [employer] of Occupational Health - Occupational Stress Questionnaire    Feeling of Stress: Only a little  Recent Concern: Stress - Stress Concern Present (06/25/2024)   [employer] of Occupational Health - Occupational Stress Questionnaire    Feeling of Stress: Very much  Social Connections: Socially Isolated (07/11/2024)   Social Connection and Isolation Panel    Frequency of Communication with Friends and Family: Never    Frequency of Social Gatherings with Friends and Family: Never    Attends Religious Services: Never    Database administrator or Organizations: Not on file    Attends Banker Meetings: Never    Marital Status: Widowed  Intimate Partner Violence: Not At Risk (07/18/2024)   Humiliation, Afraid, Rape, and Kick questionnaire    Fear of Current or Ex-Partner: No    Emotionally Abused: No    Physically Abused: No    Sexually Abused: No    Outpatient Encounter Medications as of 09/06/2024  Medication Sig   Abatacept  (ORENCIA  CLICKJECT) 125 MG/ML SOAJ Inject 125 mg into the skin once a week.   acetaminophen   (TYLENOL ) 650 MG CR tablet Take 1,300 mg by mouth every 8 (eight) hours as needed for pain.   apixaban  (ELIQUIS ) 5 MG TABS tablet Take 1 tablet (5 mg total) by mouth 2 (two) times daily.   betamethasone dipropionate (DIPROLENE) 0.05 % ointment Apply topically as needed.   Calcium -Magnesium-Zinc (CAL-MAG-ZINC PO) Take 1 tablet by mouth daily.   cyanocobalamin  (VITAMIN B12) 1000 MCG tablet Take 1 tablet by mouth daily. (Patient not taking: Reported on 08/28/2024)   Ergocalciferol  (VITAMIN D2) 10 MCG (400 UNIT) TABS Take 1 tablet by mouth daily.   fluticasone  (FLONASE ) 50 MCG/ACT nasal spray USE 2 SPRAYS IN EACH NOSTRIL DAILY   gabapentin  (NEURONTIN ) 600 MG tablet Take 0.5 tablets (300 mg total) by mouth at bedtime for 21 days. Then stop   Iron , Ferrous Sulfate , 325 (65 Fe) MG TABS Take 325 mg by mouth daily.   leflunomide  (ARAVA ) 20 MG tablet TAKE ONE (1)  TABLET BY MOUTH EVERY DAY   losartan  (COZAAR ) 50 MG tablet Take 1 tablet (50 mg total) by mouth daily.   Multiple Vitamin (MULTIVITAMIN PO) Take 1 tablet by mouth daily.   oxyCODONE -acetaminophen  (PERCOCET/ROXICET) 5-325 MG tablet Take 1 tablet by mouth every 6 (six) hours as needed for severe pain (pain score 7-10).   oxyCODONE -acetaminophen  (PERCOCET/ROXICET) 5-325 MG tablet Take 1 tablet by mouth every 6 (six) hours as needed for severe pain (pain score 7-10).   rosuvastatin  (CRESTOR ) 10 MG tablet Take 1 tablet (10 mg total) by mouth daily.   UNABLE TO FIND Med Name: CBD roll on, as needed   VITAMIN E  PO Take 1 capsule by mouth daily.   No facility-administered encounter medications on file as of 09/06/2024.    Allergies  Allergen Reactions   Electrodes 25mm [Tens Therapy Replace Back Pads] Rash    EKG electrodes   Enbrel  [Etanercept ] Rash    Psoriasis    Pertinent ROS per HPI, otherwise unremarkable      Objective:  There were no vitals taken for this visit.   Wt Readings from Last 3 Encounters:  08/28/24 202 lb 9.6 oz (91.9 kg)   08/25/24 202 lb 13.2 oz (92 kg)  08/22/24 203 lb (92.1 kg)    Physical Exam Physical Exam      Results for orders placed or performed in visit on 08/28/24  D-dimer, quantitative   Collection Time: 08/28/24  9:03 AM  Result Value Ref Range   D-Dimer, Quant >20.00 (H) 0.00 - 0.50 ug/mL-FEU  CBC with Differential (Cancer Center Only)   Collection Time: 08/28/24  9:03 AM  Result Value Ref Range   WBC Count 4.0 4.0 - 10.5 K/uL   RBC 3.96 3.87 - 5.11 MIL/uL   Hemoglobin 10.5 (L) 12.0 - 15.0 g/dL   HCT 66.4 (L) 63.9 - 53.9 %   MCV 84.6 80.0 - 100.0 fL   MCH 26.5 26.0 - 34.0 pg   MCHC 31.3 30.0 - 36.0 g/dL   RDW 82.7 (H) 88.4 - 84.4 %   Platelet Count 320 150 - 400 K/uL   nRBC 0.0 0.0 - 0.2 %   Neutrophils Relative % 50 %   Neutro Abs 2.0 1.7 - 7.7 K/uL   Lymphocytes Relative 44 %   Lymphs Abs 1.8 0.7 - 4.0 K/uL   Monocytes Relative 3 %   Monocytes Absolute 0.1 0.1 - 1.0 K/uL   Eosinophils Relative 3 %   Eosinophils Absolute 0.1 0.0 - 0.5 K/uL   Basophils Relative 0 %   Basophils Absolute 0.0 0.0 - 0.1 K/uL   Immature Granulocytes 0 %   Abs Immature Granulocytes 0.01 0.00 - 0.07 K/uL       Pertinent labs & imaging results that were available during my care of the patient were reviewed by me and considered in my medical decision making.  Assessment & Plan:  There are no diagnoses linked to this encounter.   Assessment and Plan Assessment & Plan       Continue all other maintenance medications.  Follow up plan: No follow-ups on file.   Continue healthy lifestyle choices, including diet (rich in fruits, vegetables, and lean proteins, and low in salt and simple carbohydrates) and exercise (at least 30 minutes of moderate physical activity daily).  Educational handout given for ***  The above assessment and management plan was discussed with the patient. The patient verbalized understanding of and has agreed to the management plan. Patient is aware to  call the  clinic if they develop any new symptoms or if symptoms persist or worsen. Patient is aware when to return to the clinic for a follow-up visit. Patient educated on when it is appropriate to go to the emergency department.  @SIGNATURE @

## 2024-09-06 ENCOUNTER — Ambulatory Visit (INDEPENDENT_AMBULATORY_CARE_PROVIDER_SITE_OTHER): Admitting: Nurse Practitioner

## 2024-09-06 ENCOUNTER — Encounter: Payer: Self-pay | Admitting: Nurse Practitioner

## 2024-09-06 VITALS — BP 123/83 | HR 97 | Temp 97.2°F | Ht 63.0 in | Wt 197.0 lb

## 2024-09-06 DIAGNOSIS — I824Y9 Acute embolism and thrombosis of unspecified deep veins of unspecified proximal lower extremity: Secondary | ICD-10-CM

## 2024-09-06 DIAGNOSIS — M79605 Pain in left leg: Secondary | ICD-10-CM | POA: Diagnosis not present

## 2024-09-06 DIAGNOSIS — Z09 Encounter for follow-up examination after completed treatment for conditions other than malignant neoplasm: Secondary | ICD-10-CM | POA: Diagnosis not present

## 2024-09-10 ENCOUNTER — Encounter: Payer: Self-pay | Admitting: *Deleted

## 2024-09-10 ENCOUNTER — Other Ambulatory Visit: Payer: Self-pay | Admitting: *Deleted

## 2024-09-10 DIAGNOSIS — I951 Orthostatic hypotension: Secondary | ICD-10-CM | POA: Diagnosis not present

## 2024-09-10 DIAGNOSIS — I2699 Other pulmonary embolism without acute cor pulmonale: Secondary | ICD-10-CM | POA: Diagnosis not present

## 2024-09-10 DIAGNOSIS — I351 Nonrheumatic aortic (valve) insufficiency: Secondary | ICD-10-CM | POA: Diagnosis not present

## 2024-09-10 DIAGNOSIS — M1712 Unilateral primary osteoarthritis, left knee: Secondary | ICD-10-CM | POA: Diagnosis not present

## 2024-09-10 DIAGNOSIS — E7800 Pure hypercholesterolemia, unspecified: Secondary | ICD-10-CM | POA: Diagnosis not present

## 2024-09-10 DIAGNOSIS — Z79899 Other long term (current) drug therapy: Secondary | ICD-10-CM | POA: Diagnosis not present

## 2024-09-10 DIAGNOSIS — M0579 Rheumatoid arthritis with rheumatoid factor of multiple sites without organ or systems involvement: Secondary | ICD-10-CM | POA: Diagnosis not present

## 2024-09-10 DIAGNOSIS — Z7901 Long term (current) use of anticoagulants: Secondary | ICD-10-CM | POA: Diagnosis not present

## 2024-09-10 DIAGNOSIS — Z87891 Personal history of nicotine dependence: Secondary | ICD-10-CM | POA: Diagnosis not present

## 2024-09-10 DIAGNOSIS — M5116 Intervertebral disc disorders with radiculopathy, lumbar region: Secondary | ICD-10-CM | POA: Diagnosis not present

## 2024-09-10 DIAGNOSIS — M85851 Other specified disorders of bone density and structure, right thigh: Secondary | ICD-10-CM | POA: Diagnosis not present

## 2024-09-10 DIAGNOSIS — I10 Essential (primary) hypertension: Secondary | ICD-10-CM | POA: Diagnosis not present

## 2024-09-10 DIAGNOSIS — Z96651 Presence of right artificial knee joint: Secondary | ICD-10-CM | POA: Diagnosis not present

## 2024-09-10 DIAGNOSIS — Z86718 Personal history of other venous thrombosis and embolism: Secondary | ICD-10-CM | POA: Diagnosis not present

## 2024-09-10 NOTE — Patient Outreach (Signed)
 Complex Care Management   Visit Note  09/10/2024  Name:  Jean Howell MRN: [id number] DOB: 22-Sep-1956  Situation: Referral received for Complex Care Management related to HTN I obtained verbal consent from Patient.  Visit completed with Patient  on the phone  Background:   Past Medical History:  Diagnosis Date   Arthritis    Depression    Hypertension    Rheumatoid arthritis (HCC)     Assessment: Patient Reported Symptoms:  Cognitive Cognitive Status: No symptoms reported Cognitive/Intellectual Conditions Management [RPT]: None reported or documented in medical history or problem list   Health Maintenance Behaviors: Annual physical exam Healing Pattern: Average Health Facilitated by: Stress management  Neurological Neurological Review of Symptoms: No symptoms reported Neurological Management Strategies: Routine screening Neurological Self-Management Outcome: 4 (good)  HEENT HEENT Symptoms Reported: Tinnitus, Ear pain HEENT Management Strategies: Routine screening HEENT Self-Management Outcome: 3 (uncertain) HEENT Comment: Right ear pain noted yesterday, tinnitus has been ongoing for some time. Denies dental pain at this time    Cardiovascular Cardiovascular Symptoms Reported: No symptoms reported Cardiovascular Management Strategies: Routine screening Cardiovascular Self-Management Outcome: 4 (good)  Respiratory Respiratory Symptoms Reported: Shortness of breath Additional Respiratory Details: only with exertion Respiratory Management Strategies: Routine screening Respiratory Self-Management Outcome: 4 (good)  Endocrine Endocrine Symptoms Reported: No symptoms reported Is patient diabetic?: No Endocrine Self-Management Outcome: 4 (good)  Gastrointestinal Gastrointestinal Symptoms Reported: No symptoms reported Gastrointestinal Self-Management Outcome: 4 (good)    Genitourinary Genitourinary Symptoms Reported: No symptoms reported Genitourinary Self-Management Outcome:  4 (good)  Integumentary Integumentary Symptoms Reported: Rash Skin Management Strategies: Routine screening Skin Self-Management Outcome: 4 (good)  Musculoskeletal Musculoskelatal Symptoms Reviewed: Difficulty walking, Limited mobility Musculoskeletal Management Strategies: Medical device, Routine screening Musculoskeletal Self-Management Outcome: 4 (good) Musculoskeletal Comment: uses cane/walker Falls in the past year?: No Number of falls in past year: 1 or less Was there an injury with Fall?: No Fall Risk Category Calculator: 0 Patient Fall Risk Level: Low Fall Risk Patient at Risk for Falls Due to: No Fall Risks Fall risk Follow up: Falls evaluation completed  Psychosocial Psychosocial Symptoms Reported: No symptoms reported Behavioral Management Strategies: Support system Behavioral Health Self-Management Outcome: 4 (good) Major Change/Loss/Stressor/Fears (CP): Denies Techniques to Cope with Loss/Stress/Change: Not applicable Quality of Family Relationships: helpful Do you feel physically threatened by others?: No    09/10/2024    PHQ2-9 Depression Screening   Little interest or pleasure in doing things Not at all  Feeling down, depressed, or hopeless Not at all  PHQ-2 - Total Score 0  Trouble falling or staying asleep, or sleeping too much    Feeling tired or having little energy    Poor appetite or overeating     Feeling bad about yourself - or that you are a failure or have let yourself or your family down    Trouble concentrating on things, such as reading the newspaper or watching television    Moving or speaking so slowly that other people could have noticed.  Or the opposite - being so fidgety or restless that you have been moving around a lot more than usual    Thoughts that you would be better off dead, or hurting yourself in some way    PHQ2-9 Total Score    If you checked off any problems, how difficult have these problems made it for you to do your work, take care  of things at home, or get along with other people    Depression Interventions/Treatment  Vitals:   09/10/24 1159  BP: (!) 147/95  Pulse: 94    Medications Reviewed Today     Reviewed by Bertrum Rosina HERO, RN (Registered Nurse) on 09/10/24 at 1209  Med List Status: <None>   Medication Order Taking? Sig Documenting Provider Last Dose Status Informant  Abatacept  (ORENCIA  CLICKJECT) 125 MG/ML SOAJ [id number] Yes Inject 125 mg into the skin once a week. Dolphus Reiter, MD  Active   acetaminophen  (TYLENOL ) 650 MG CR tablet [id number] Yes Take 1,300 mg by mouth every 8 (eight) hours as needed for pain. [provider]  Active Self, Pharmacy Records  apixaban  (ELIQUIS ) 5 MG TABS tablet [id number] Yes Take 1 tablet (5 mg total) by mouth 2 (two) times daily. Pasam, Avinash, MD  Active   betamethasone dipropionate (DIPROLENE) 0.05 % ointment [id number] Yes Apply topically as needed. [provider]  Active Self, Pharmacy Records  Calcium -Magnesium-Zinc (CAL-MAG-ZINC PO) [id number] Yes Take 1 tablet by mouth daily. [provider]  Active Self, Pharmacy Records  cyanocobalamin  (VITAMIN B12) 1000 MCG tablet [id number]  Take 1 tablet by mouth daily.  Patient not taking: Reported on 09/10/2024   [provider]  Consider Medication Status and Discontinue Self, Pharmacy Records  Ergocalciferol  (VITAMIN D2) 10 MCG (400 UNIT) TABS [id number] Yes Take 1 tablet by mouth daily. [provider]  Active Self, Pharmacy Records  fluticasone  (FLONASE ) 50 MCG/ACT nasal spray [id number] Yes USE 2 SPRAYS IN EACH NOSTRIL DAILY Gottschalk, Ashly M, DO  Active Self, Pharmacy Records  gabapentin  (NEURONTIN ) 600 MG tablet [id number] Yes Take 0.5 tablets (300 mg total) by mouth at bedtime for 21 days. Then stop  Patient taking differently: Take 600 mg by mouth at bedtime. Then stop   Gottschalk, Ashly M, DO  Active   Iron , Ferrous Sulfate , 325 (65 Fe) MG TABS [id number] Yes Take 325  mg by mouth daily. Severa Rock HERO, FNP  Active   leflunomide  (ARAVA ) 20 MG tablet [id number] Yes TAKE ONE (1) TABLET BY MOUTH EVERY DAY Cheryl Waddell HERO, PA-C  Active Self, Pharmacy Records  losartan  (COZAAR ) 50 MG tablet [id number] Yes Take 1 tablet (50 mg total) by mouth daily. Jolinda Potter M, DO  Active   Multiple Vitamin (MULTIVITAMIN PO) [id number] Yes Take 1 tablet by mouth daily. [provider]  Active Self, Pharmacy Records  oxyCODONE -acetaminophen  (PERCOCET/ROXICET) 5-325 MG tablet [id number]  Take 1 tablet by mouth every 6 (six) hours as needed for severe pain (pain score 7-10).  Patient not taking: Reported on 09/10/2024   Mesner, Selinda, MD  Consider Medication Status and Discontinue   oxyCODONE -acetaminophen  (PERCOCET/ROXICET) 5-325 MG tablet [id number] Yes Take 1 tablet by mouth every 6 (six) hours as needed for severe pain (pain score 7-10). Haviland, Julie, MD  Active   rosuvastatin  (CRESTOR ) 10 MG tablet [id number] Yes Take 1 tablet (10 mg total) by mouth daily. Jolinda Potter HERO, DO  Active Self, Pharmacy Records  UNABLE TO FIND [id number] Yes Med Name: CBD roll on, as needed [provider]  Active Self, Pharmacy Records  VITAMIN E  PO [id number] Yes Take 1 capsule by mouth daily. [provider]  Active Self, Pharmacy Records            Recommendation:   Continue Current Plan of Care  Follow Up Plan:   Telephone follow-up in 1 month  Rosina Bertrum, BSN RN [facility], [facility] RN Care Manager Direct Dial: [phone]  Fax: [phone]

## 2024-09-10 NOTE — Patient Instructions (Signed)
 Avelina Birk - I am sorry I was unable to reach you today for our scheduled appointment. I work with Jolinda Norene HERO, DO and am calling to support your healthcare needs. Please contact me at [phone] at your earliest convenience. I look forward to speaking with you soon.   Thank you,  Rosina Forte, BSN RN [facility], [facility] RN Care Manager Direct Dial: [phone]  Fax: [phone]

## 2024-09-10 NOTE — Patient Instructions (Signed)
 Visit Information  Thank you for taking time to visit with me today. Please don't hesitate to contact me if I can be of assistance to you before our next scheduled appointment.  Your next care management appointment is by telephone on 10-11-2024 at 1:30 pm  Telephone follow-up in 1 month  Please call the care guide team at [phone] if you need to cancel, schedule, or reschedule an appointment.   Please call the Suicide and Crisis Lifeline: 988 call the [place]  National Suicide Prevention Lifeline: 234-683-6059 or TTY: (906)523-4096 TTY (204)407-4267) to talk to a trained counselor call 1-800-273-TALK (toll free, 24 hour hotline) if you are experiencing a Mental Health or Behavioral Health Crisis or need someone to talk to.  Rosina Forte, BSN RN [facility], [facility] RN Care Manager Direct Dial: [phone]  Fax: [phone]

## 2024-09-11 ENCOUNTER — Telehealth: Admitting: Licensed Clinical Social Worker

## 2024-09-12 ENCOUNTER — Encounter: Payer: Self-pay | Admitting: Oncology

## 2024-09-12 DIAGNOSIS — I2699 Other pulmonary embolism without acute cor pulmonale: Secondary | ICD-10-CM | POA: Diagnosis not present

## 2024-09-12 DIAGNOSIS — I10 Essential (primary) hypertension: Secondary | ICD-10-CM | POA: Diagnosis not present

## 2024-09-12 DIAGNOSIS — Z79899 Other long term (current) drug therapy: Secondary | ICD-10-CM | POA: Diagnosis not present

## 2024-09-12 DIAGNOSIS — M1712 Unilateral primary osteoarthritis, left knee: Secondary | ICD-10-CM | POA: Diagnosis not present

## 2024-09-12 DIAGNOSIS — M0579 Rheumatoid arthritis with rheumatoid factor of multiple sites without organ or systems involvement: Secondary | ICD-10-CM | POA: Diagnosis not present

## 2024-09-12 DIAGNOSIS — Z96651 Presence of right artificial knee joint: Secondary | ICD-10-CM | POA: Diagnosis not present

## 2024-09-12 DIAGNOSIS — E7800 Pure hypercholesterolemia, unspecified: Secondary | ICD-10-CM | POA: Diagnosis not present

## 2024-09-12 DIAGNOSIS — Z7901 Long term (current) use of anticoagulants: Secondary | ICD-10-CM | POA: Diagnosis not present

## 2024-09-12 DIAGNOSIS — M5116 Intervertebral disc disorders with radiculopathy, lumbar region: Secondary | ICD-10-CM | POA: Diagnosis not present

## 2024-09-12 DIAGNOSIS — Z86718 Personal history of other venous thrombosis and embolism: Secondary | ICD-10-CM | POA: Diagnosis not present

## 2024-09-12 DIAGNOSIS — Z87891 Personal history of nicotine dependence: Secondary | ICD-10-CM | POA: Diagnosis not present

## 2024-09-12 DIAGNOSIS — I351 Nonrheumatic aortic (valve) insufficiency: Secondary | ICD-10-CM | POA: Diagnosis not present

## 2024-09-12 DIAGNOSIS — I951 Orthostatic hypotension: Secondary | ICD-10-CM | POA: Diagnosis not present

## 2024-09-12 DIAGNOSIS — M85851 Other specified disorders of bone density and structure, right thigh: Secondary | ICD-10-CM | POA: Diagnosis not present

## 2024-09-12 NOTE — Assessment & Plan Note (Addendum)
 Incidentally found to have features concerning for PE on cardiac imaging, and was sent to ER on 05/11/2024.  She was found to have moderate amount of thrombus in the distal right main pulmonary artery extending to the lobar and segmental branches of the right upper and right lower lobes and borderline heart strain.  Ultrasound of the lower extremities showed left popliteal clot that appeared to be chronic.  She was briefly admitted.  Echocardiogram on 05/12/2024 did not show evidence of right heart strain.  She was treated initially with Lovenox  and transitioned over to Eliquis .   Possible contributing factors include reduced mobility due to osteoarthritis. She does have a family history of blood clots in niece and nephew.  On her consultation with us  on 06/18/2024, thrombophilia workup was pursued.  No evidence of prothrombin gene mutation, factor V Leiden mutation, lupus anticoagulant.  Anticardiolipin antibody IgM was increased at 40 units/mL and IgA was indeterminate at 15 units/mL.  Beta-2  glycoprotein IgG antibody was increased at 28 units.  IgA and IgM were within normal limits.  D-dimer was increased >20.00.   Rest of the hypercoagulable workup was deferred as it can be falsely abnormal given recent thromboembolism.    She was advised to continue Eliquis  5 mg twice daily.   Given the extent of pulmonary embolism, at least 6 months of anticoagulation was recommended.  She had repeat CT angio of the chest on 07/10/2024 which showed no evidence of pulmonary embolism.  Had repeat ultrasound of the left lower extremity on 08/26/2024 in the ED and it showed no evidence of DVT.   Plan to repeat anticardiolipin antibody testing and beta-2  glycoprotein antibody testing, along with completion of rest of the thrombophilia workup on return visit.   Abnormal antiphospholipid antibody tests necessitate continued anticoagulation. She was advised to remain on anticoagulation until further instructions.

## 2024-09-13 ENCOUNTER — Ambulatory Visit: Attending: Rheumatology | Admitting: Rheumatology

## 2024-09-13 ENCOUNTER — Encounter: Payer: Self-pay | Admitting: Rheumatology

## 2024-09-13 VITALS — BP 149/87 | HR 103 | Temp 97.8°F | Resp 16 | Ht 63.0 in | Wt 200.2 lb

## 2024-09-13 DIAGNOSIS — Z800 Family history of malignant neoplasm of digestive organs: Secondary | ICD-10-CM

## 2024-09-13 DIAGNOSIS — M545 Low back pain, unspecified: Secondary | ICD-10-CM

## 2024-09-13 DIAGNOSIS — R2681 Unsteadiness on feet: Secondary | ICD-10-CM

## 2024-09-13 DIAGNOSIS — R7689 Other specified abnormal immunological findings in serum: Secondary | ICD-10-CM | POA: Diagnosis not present

## 2024-09-13 DIAGNOSIS — M0579 Rheumatoid arthritis with rheumatoid factor of multiple sites without organ or systems involvement: Secondary | ICD-10-CM | POA: Diagnosis not present

## 2024-09-13 DIAGNOSIS — G8929 Other chronic pain: Secondary | ICD-10-CM

## 2024-09-13 DIAGNOSIS — M1712 Unilateral primary osteoarthritis, left knee: Secondary | ICD-10-CM

## 2024-09-13 DIAGNOSIS — Z96651 Presence of right artificial knee joint: Secondary | ICD-10-CM

## 2024-09-13 DIAGNOSIS — Z8349 Family history of other endocrine, nutritional and metabolic diseases: Secondary | ICD-10-CM

## 2024-09-13 DIAGNOSIS — Z87891 Personal history of nicotine dependence: Secondary | ICD-10-CM | POA: Diagnosis not present

## 2024-09-13 DIAGNOSIS — Z8659 Personal history of other mental and behavioral disorders: Secondary | ICD-10-CM

## 2024-09-13 DIAGNOSIS — I10 Essential (primary) hypertension: Secondary | ICD-10-CM | POA: Diagnosis not present

## 2024-09-13 DIAGNOSIS — R5383 Other fatigue: Secondary | ICD-10-CM

## 2024-09-13 DIAGNOSIS — Z79899 Other long term (current) drug therapy: Secondary | ICD-10-CM | POA: Diagnosis not present

## 2024-09-13 DIAGNOSIS — R768 Other specified abnormal immunological findings in serum: Secondary | ICD-10-CM

## 2024-09-13 NOTE — Patient Instructions (Addendum)
 Standing Labs We placed an order today for your standing lab work.   Please have your standing labs drawn in December and every 3 months  Please have your labs drawn 2 weeks prior to your appointment so that the provider can discuss your lab results at your appointment, if possible.  Please note that you may see your imaging and lab results in MyChart before we have reviewed them. We will contact you once all results are reviewed. Please allow our office up to 72 hours to thoroughly review all of the results before contacting the office for clarification of your results.  WALK-IN LAB HOURS  Monday through Thursday from 8:00 am -12:30 pm and 1:00 pm-4:30 pm and Friday from 8:00 am-12:00 pm.  Patients with office visits requiring labs will be seen before walk-in labs.  You may encounter longer than normal wait times. Please allow additional time. Wait times may be shorter on  Monday and Thursday afternoons.  We do not book appointments for walk-in labs. We appreciate your patience and understanding with our staff.   Labs are drawn by [facility]. Please bring your co-pay at the time of your lab draw.  You may receive a bill from [facility] for your lab work.  Please note if you are on Hydroxychloroquine  and and an order has been placed for a Hydroxychloroquine  level,  you will need to have it drawn 4 hours or more after your last dose.  If you wish to have your labs drawn at another location, please call the office 24 hours in advance so we can fax the orders.  The office is located at [address], [address], [place], [place] [address]   If you have any questions regarding directions or hours of operation,  please call [phone].   As a reminder, please drink plenty of water  prior to coming for your lab work. Thanks!   Vaccines You are taking a medication(s) that can suppress your immune system.  The following immunizations are recommended: Flu annually (high dose) Covid-19  RSV Td/Tdap  (tetanus, diphtheria, pertussis) every 10 years Pneumonia (Prevnar 15 then Pneumovax 23 at least 1 year apart.  Alternatively, can take Prevnar 20  without needing additional dose) Shingrix: 2 doses from 4 weeks to 6 months apart  Please check with your PCP to make sure you are up to date.   If you have signs or symptoms of an infection or start antibiotics: First, call your PCP for workup of your infection. Hold your medication through the infection, until you complete your antibiotics, and until symptoms resolve if you take the following: Injectable medication (Actemra, Benlysta, Cimzia, Cosentyx, Enbrel , Humira , Kevzara, Orencia , Remicade, Simponi, Stelara, Taltz, Tremfya) Methotrexate  Leflunomide  (Arava ) Mycophenolate (Cellcept) Xeljanz, Olumiant, or Rinvoq   Please get an annual skin examination to screen for skin cancer while you are on Orencia .  Please use sunscreen and sun protection.

## 2024-09-14 ENCOUNTER — Ambulatory Visit

## 2024-09-14 DIAGNOSIS — Z86718 Personal history of other venous thrombosis and embolism: Secondary | ICD-10-CM

## 2024-09-14 DIAGNOSIS — F32A Depression, unspecified: Secondary | ICD-10-CM

## 2024-09-14 DIAGNOSIS — Z7901 Long term (current) use of anticoagulants: Secondary | ICD-10-CM | POA: Diagnosis not present

## 2024-09-14 DIAGNOSIS — Z6825 Body mass index (BMI) 25.0-25.9, adult: Secondary | ICD-10-CM

## 2024-09-14 DIAGNOSIS — Z96651 Presence of right artificial knee joint: Secondary | ICD-10-CM | POA: Diagnosis not present

## 2024-09-14 DIAGNOSIS — I951 Orthostatic hypotension: Secondary | ICD-10-CM | POA: Diagnosis not present

## 2024-09-14 DIAGNOSIS — I10 Essential (primary) hypertension: Secondary | ICD-10-CM | POA: Diagnosis not present

## 2024-09-14 DIAGNOSIS — Z87891 Personal history of nicotine dependence: Secondary | ICD-10-CM | POA: Diagnosis not present

## 2024-09-14 DIAGNOSIS — E7800 Pure hypercholesterolemia, unspecified: Secondary | ICD-10-CM | POA: Diagnosis not present

## 2024-09-14 DIAGNOSIS — M0579 Rheumatoid arthritis with rheumatoid factor of multiple sites without organ or systems involvement: Secondary | ICD-10-CM

## 2024-09-14 DIAGNOSIS — I2699 Other pulmonary embolism without acute cor pulmonale: Secondary | ICD-10-CM | POA: Diagnosis not present

## 2024-09-18 DIAGNOSIS — I951 Orthostatic hypotension: Secondary | ICD-10-CM | POA: Diagnosis not present

## 2024-09-18 DIAGNOSIS — M85851 Other specified disorders of bone density and structure, right thigh: Secondary | ICD-10-CM | POA: Diagnosis not present

## 2024-09-18 DIAGNOSIS — Z96651 Presence of right artificial knee joint: Secondary | ICD-10-CM | POA: Diagnosis not present

## 2024-09-18 DIAGNOSIS — I351 Nonrheumatic aortic (valve) insufficiency: Secondary | ICD-10-CM | POA: Diagnosis not present

## 2024-09-18 DIAGNOSIS — I2699 Other pulmonary embolism without acute cor pulmonale: Secondary | ICD-10-CM | POA: Diagnosis not present

## 2024-09-18 DIAGNOSIS — Z87891 Personal history of nicotine dependence: Secondary | ICD-10-CM | POA: Diagnosis not present

## 2024-09-18 DIAGNOSIS — Z79899 Other long term (current) drug therapy: Secondary | ICD-10-CM | POA: Diagnosis not present

## 2024-09-18 DIAGNOSIS — M1712 Unilateral primary osteoarthritis, left knee: Secondary | ICD-10-CM | POA: Diagnosis not present

## 2024-09-18 DIAGNOSIS — I10 Essential (primary) hypertension: Secondary | ICD-10-CM | POA: Diagnosis not present

## 2024-09-18 DIAGNOSIS — Z86718 Personal history of other venous thrombosis and embolism: Secondary | ICD-10-CM | POA: Diagnosis not present

## 2024-09-18 DIAGNOSIS — M5116 Intervertebral disc disorders with radiculopathy, lumbar region: Secondary | ICD-10-CM | POA: Diagnosis not present

## 2024-09-18 DIAGNOSIS — M0579 Rheumatoid arthritis with rheumatoid factor of multiple sites without organ or systems involvement: Secondary | ICD-10-CM | POA: Diagnosis not present

## 2024-09-18 DIAGNOSIS — Z7901 Long term (current) use of anticoagulants: Secondary | ICD-10-CM | POA: Diagnosis not present

## 2024-09-18 DIAGNOSIS — E7800 Pure hypercholesterolemia, unspecified: Secondary | ICD-10-CM | POA: Diagnosis not present

## 2024-09-19 ENCOUNTER — Other Ambulatory Visit: Payer: Self-pay

## 2024-09-20 ENCOUNTER — Other Ambulatory Visit: Payer: Self-pay | Admitting: Licensed Clinical Social Worker

## 2024-09-20 NOTE — Patient Outreach (Signed)
 Complex Care Management   Visit Note  09/20/2024  Name:  Jean Howell MRN: [id number] DOB: 12-11-1955  Situation: Referral received for Complex Care Management related to Mental/Behavioral Health diagnosis anxiety. I obtained verbal consent from Patient.  Visit completed with Patient  on the phone  Background:   Past Medical History:  Diagnosis Date   Arthritis    Depression    Hypertension    Rheumatoid arthritis (HCC)     Assessment: Patient Reported Symptoms:  Cognitive Cognitive Status: Able to follow simple commands, Alert and oriented to person, place, and time, Normal speech and language skills Cognitive/Intellectual Conditions Management [RPT]: None reported or documented in medical history or problem list   Health Maintenance Behaviors: Annual physical exam Healing Pattern: Average Health Facilitated by: Stress management  Neurological Neurological Review of Symptoms: No symptoms reported Neurological Management Strategies: Routine screening Neurological Self-Management Outcome: 4 (good)  HEENT HEENT Symptoms Reported: Tinnitus, Ear pain HEENT Management Strategies: Routine screening HEENT Self-Management Outcome: 3 (uncertain)    Cardiovascular Cardiovascular Symptoms Reported: No symptoms reported Does patient have uncontrolled Hypertension?: No Cardiovascular Management Strategies: Routine screening Cardiovascular Self-Management Outcome: 4 (good)  Respiratory Respiratory Symptoms Reported: Shortness of breath Other Respiratory Symptoms: Denied BH referral as anxiety dx is listed Respiratory Management Strategies: Routine screening, Adequate rest Respiratory Self-Management Outcome: 4 (good)  Musculoskeletal Musculoskelatal Symptoms Reviewed: Difficulty walking, Limited mobility Musculoskeletal Management Strategies: Medical device, Medication therapy, Routine screening, Adequate rest, Coping strategies Musculoskeletal Self-Management Outcome: 3  (uncertain) Musculoskeletal Comment: uses cane/walker Falls in the past year?: No Number of falls in past year: 1 or less Was there an injury with Fall?: No Fall Risk Category Calculator: 0 Patient Fall Risk Level: Low Fall Risk    Psychosocial Psychosocial Symptoms Reported: No symptoms reported Additional Psychological Details: Pt does not want BH support at this time Behavioral Management Strategies: Support system, Coping strategies Behavioral Health Self-Management Outcome: 4 (good) Major Change/Loss/Stressor/Fears (CP): Denies Techniques to Cope with Loss/Stress/Change: Not applicable Quality of Family Relationships: helpful, involved Do you feel physically threatened by others?: No    09/20/2024    PHQ2-9 Depression Screening   Little interest or pleasure in doing things Not at all  Feeling down, depressed, or hopeless Not at all  PHQ-2 - Total Score 0  Trouble falling or staying asleep, or sleeping too much    Feeling tired or having little energy    Poor appetite or overeating     Feeling bad about yourself - or that you are a failure or have let yourself or your family down    Trouble concentrating on things, such as reading the newspaper or watching television    Moving or speaking so slowly that other people could have noticed.  Or the opposite - being so fidgety or restless that you have been moving around a lot more than usual    Thoughts that you would be better off dead, or hurting yourself in some way    PHQ2-9 Total Score    If you checked off any problems, how difficult have these problems made it for you to do your work, take care of things at home, or get along with other people    Depression Interventions/Treatment Patient refuses Treatment, Community Resources Provided    There were no vitals filed for this visit.  Medications Reviewed Today     Reviewed by Merlynn Lyle CROME, LCSW (Social Worker) on 09/20/24 at 1315  Med List Status: <None>   Medication  Order Taking?  Sig Documenting Provider Last Dose Status Informant  Abatacept  (ORENCIA  CLICKJECT) 125 MG/ML SOAJ [id number]  Inject 125 mg into the skin once a week. Dolphus Reiter, MD  Active   acetaminophen  (TYLENOL ) 650 MG CR tablet [id number]  Take 1,300 mg by mouth every 8 (eight) hours as needed for pain. [provider]  Active Self, Pharmacy Records  apixaban  (ELIQUIS ) 5 MG TABS tablet [id number]  Take 1 tablet (5 mg total) by mouth 2 (two) times daily. Pasam, Avinash, MD  Active   betamethasone dipropionate (DIPROLENE) 0.05 % ointment [id number]  Apply topically as needed. [provider]  Active Self, Pharmacy Records  Calcium -Magnesium-Zinc (CAL-MAG-ZINC PO) [id number]  Take 1 tablet by mouth daily. [provider]  Active Self, Pharmacy Records  cyanocobalamin  (VITAMIN B12) 1000 MCG tablet [id number]  Take 1 tablet by mouth daily.  Patient not taking: Reported on 09/13/2024   [provider]  Active Self, Pharmacy Records  Ergocalciferol  (VITAMIN D2) 10 MCG (400 UNIT) TABS [id number]  Take 1 tablet by mouth daily. [provider]  Active Self, Pharmacy Records  fluticasone  (FLONASE ) 50 MCG/ACT nasal spray [id number]  USE 2 SPRAYS IN EACH NOSTRIL DAILY Gottschalk, Ashly M, DO  Active Self, Pharmacy Records  gabapentin  (NEURONTIN ) 600 MG tablet [id number]  Take 0.5 tablets (300 mg total) by mouth at bedtime for 21 days. Then stop  Patient taking differently: Take 600 mg by mouth at bedtime. Then stop   Jolinda Norene HERO, DO  Expired 09/13/24 2359   Iron , Ferrous Sulfate , 325 (65 Fe) MG TABS [id number]  Take 325 mg by mouth daily. Severa Rock HERO, FNP  Active   leflunomide  (ARAVA ) 20 MG tablet [id number]  TAKE ONE (1) TABLET BY MOUTH EVERY DAY Cheryl Waddell HERO, PA-C  Active Self, Pharmacy Records  losartan  (COZAAR ) 50 MG tablet [id number]  Take 1 tablet (50 mg total) by mouth daily. Jolinda Norene M, DO  Active   Multiple Vitamin (MULTIVITAMIN PO)  [id number]  Take 1 tablet by mouth daily. [provider]  Active Self, Pharmacy Records  oxyCODONE -acetaminophen  (PERCOCET/ROXICET) 5-325 MG tablet [id number]  Take 1 tablet by mouth every 6 (six) hours as needed for severe pain (pain score 7-10).  Patient not taking: Reported on 09/13/2024   Mesner, Selinda, MD  Active   oxyCODONE -acetaminophen  (PERCOCET/ROXICET) 5-325 MG tablet [id number]  Take 1 tablet by mouth every 6 (six) hours as needed for severe pain (pain score 7-10). Dean Clarity, MD  Active   rosuvastatin  (CRESTOR ) 10 MG tablet [id number]  Take 1 tablet (10 mg total) by mouth daily. Jolinda Norene HERO, DO  Active Self, Pharmacy Records  UNABLE TO FIND [id number]  Med Name: CBD roll on, as needed [provider]  Active Self, Pharmacy Records  VITAMIN E  PO [id number]  Take 1 capsule by mouth daily. [provider]  Active Self, Pharmacy Records           SDOH Interventions    Flowsheet Row Patient Outreach Telephone from 09/20/2024 in [facility] Office Visit from 08/22/2024 in [facility] Patient Outreach Telephone from 08/10/2024 in [place] POPULATION HEALTH DEPARTMENT Patient Outreach Telephone from 07/18/2024 in [place] POPULATION HEALTH DEPARTMENT Patient Outreach Telephone from 07/13/2024 in [place] POPULATION HEALTH DEPARTMENT Patient Outreach Telephone from 07/06/2024 in [place] POPULATION HEALTH DEPARTMENT  SDOH Interventions        Food Insecurity Interventions -- -- Intervention Not Indicated Intervention Not Indicated Intervention Not Indicated  [Ucard]  Community Resources Provided, [employer] Referral  Housing Interventions -- -- Intervention Not Indicated Intervention Not Indicated -- Intervention Not Indicated  Transportation Interventions Intervention Not Indicated -- Intervention Not Indicated Intervention Not Indicated -- --  Utilities Interventions -- --  -- Intervention Not Indicated -- --  Depression Interventions/Treatment  Patient refuses Treatment, Community Resources Provided PHQ2-9 Score <4 Follow-up Not Indicated Patient refuses Treatment, PHQ2-9 Score <4 Follow-up Not Indicated -- -- --  Financial Strain Interventions -- -- -- -- Intervention Not Indicated --  Stress Interventions Intervention Not Indicated -- Intervention Not Indicated -- -- --    Recommendation:   PCP Follow-up Continue Current Plan of Care  Follow Up Plan:   Closing From:  Complex Care Management- LCSW only  Lyle Rung, BSW, MSW, LCSW Licensed Clinical Social Worker [employer]   [email or web address] Direct Dial: [phone]

## 2024-09-20 NOTE — Patient Instructions (Signed)
 Visit Information  Thank you for taking time to visit with me today. Please don't hesitate to contact me if I can be of assistance to you before our next scheduled appointment.  Your next care management appointment is by telephone on 10/11/24 at 1:30 PM  Closing From: Complex Care Management.- LCSW only, RNCM remains involved.  Please call the care guide team at [phone] if you need to cancel, schedule, or reschedule an appointment.   Please call the Suicide and Crisis Lifeline: 988 call the [place]  National Suicide Prevention Lifeline: 718-520-7234 or TTY: 901-238-5670 TTY 717-625-1926) to talk to a trained counselor call 1-800-273-TALK (toll free, 24 hour hotline) go to [facility] [address] [place] [phone]) call the [facility] Crisis Line: [phone] call 911 if you are experiencing a Mental Health or Behavioral Health Crisis or need someone to talk to.   Lyle Rung, BSW, MSW, LCSW Licensed Clinical Social Worker [employer] [facility]   [email or web address] Direct Dial: [phone]

## 2024-09-21 ENCOUNTER — Other Ambulatory Visit: Payer: Self-pay

## 2024-09-21 ENCOUNTER — Other Ambulatory Visit (HOSPITAL_COMMUNITY): Payer: Self-pay

## 2024-09-21 NOTE — Progress Notes (Signed)
 Specialty Pharmacy Refill Coordination Note  Spoke with Jean Howell is a 68 y.o. female contacted today regarding refills of specialty medication(s) Abatacept  (Orencia  ClickJect)  Doses on hand: 1 for 10/18  Injection date: 09/29/24   Patient requested: Delivery   Delivery date: 09/25/24   Verified address: [address] [place] [address]  Medication will be filled on 09/24/24.

## 2024-09-24 ENCOUNTER — Other Ambulatory Visit: Payer: Self-pay

## 2024-10-08 ENCOUNTER — Encounter: Payer: Self-pay | Admitting: Radiology

## 2024-10-10 ENCOUNTER — Other Ambulatory Visit: Payer: Self-pay | Admitting: Physician Assistant

## 2024-10-10 NOTE — Telephone Encounter (Signed)
 Last Fill: 07/05/2024  Labs: 08/25/2024 CMP Calcium  8.5 Albumin 2.6  08/28/2024 CBC Hemoglobin 10.5 HCT 33.5 RDW 17.2  Next Visit: 02/19/2025  Last Visit: 09/13/2024  DX: Rheumatoid arthritis involving multiple sites with positive rheumatoid factor (HCC)   Current Dose per office note 09/13/2024: Arava  20 mg 1 tablet by mouth daily   Okay to refill Arava  ?

## 2024-10-11 ENCOUNTER — Other Ambulatory Visit: Payer: Self-pay | Admitting: *Deleted

## 2024-10-11 NOTE — Patient Instructions (Signed)
 Visit Information  Thank you for taking time to visit with me today. Please don't hesitate to contact me if I can be of assistance to you before our next scheduled appointment.  Your next care management appointment is by telephone on 11-09-2024 at 1:30 pm  Telephone follow-up in 1 month  Please call the care guide team at [phone] if you need to cancel, schedule, or reschedule an appointment.   Please call the Suicide and Crisis Lifeline: 988 call the [place]  National Suicide Prevention Lifeline: (437) 315-7584 or TTY: 2406556687 TTY (380)842-3965) to talk to a trained counselor call 1-800-273-TALK (toll free, 24 hour hotline) if you are experiencing a Mental Health or Behavioral Health Crisis or need someone to talk to.  Rosina Forte, BSN RN [facility], [facility] RN Care Manager Direct Dial: [phone]  Fax: [phone]

## 2024-10-11 NOTE — Patient Outreach (Signed)
 Complex Care Management   Visit Note  10/11/2024  Name:  Jean Howell MRN: [id number] DOB: December 12, 1955  Situation: Referral received for Complex Care Management related to HTN I obtained verbal consent from Patient.  Visit completed with Patient  on the phone  Background:   Past Medical History:  Diagnosis Date   Arthritis    Depression    Hypertension    Rheumatoid arthritis (HCC)     Assessment: Patient Reported Symptoms:  Cognitive Cognitive Status: Able to follow simple commands, Alert and oriented to person, place, and time   Health Maintenance Behaviors: Annual physical exam Healing Pattern: Average Health Facilitated by: Rest  Neurological Neurological Review of Symptoms: No symptoms reported Neurological Self-Management Outcome: 4 (good)  HEENT HEENT Symptoms Reported: Eye dryness HEENT Self-Management Outcome: 4 (good)    Cardiovascular Cardiovascular Symptoms Reported: Swelling in legs or feet Does patient have uncontrolled Hypertension?: No Cardiovascular Management Strategies: Routine screening Cardiovascular Self-Management Outcome: 3 (uncertain)  Respiratory Respiratory Symptoms Reported: No symptoms reported    Endocrine Endocrine Symptoms Reported: No symptoms reported Is patient diabetic?: No Endocrine Self-Management Outcome: 4 (good)  Gastrointestinal Gastrointestinal Symptoms Reported: No symptoms reported      Genitourinary Genitourinary Symptoms Reported: No symptoms reported    Integumentary Integumentary Symptoms Reported: No symptoms reported    Musculoskeletal Musculoskelatal Symptoms Reviewed: Difficulty walking, Limited mobility Musculoskeletal Management Strategies: Medical device Musculoskeletal Self-Management Outcome: 3 (uncertain) Musculoskeletal Comment: uses cane/walker Falls in the past year?: No Number of falls in past year: 1 or less Was there an injury with Fall?: No Fall Risk Category Calculator: 0 Patient Fall Risk Level:  Low Fall Risk Patient at Risk for Falls Due to: No Fall Risks Fall risk Follow up: Falls evaluation completed, Education provided  Psychosocial Psychosocial Symptoms Reported: No symptoms reported Behavioral Health Self-Management Outcome: 4 (good) Major Change/Loss/Stressor/Fears (CP): Denies Techniques to Cope with Loss/Stress/Change: Not applicable Quality of Family Relationships: helpful, involved Do you feel physically threatened by others?: No    10/11/2024    PHQ2-9 Depression Screening   Little interest or pleasure in doing things Not at all  Feeling down, depressed, or hopeless Not at all  PHQ-2 - Total Score 0  Trouble falling or staying asleep, or sleeping too much    Feeling tired or having little energy    Poor appetite or overeating     Feeling bad about yourself - or that you are a failure or have let yourself or your family down    Trouble concentrating on things, such as reading the newspaper or watching television    Moving or speaking so slowly that other people could have noticed.  Or the opposite - being so fidgety or restless that you have been moving around a lot more than usual    Thoughts that you would be better off dead, or hurting yourself in some way    PHQ2-9 Total Score    If you checked off any problems, how difficult have these problems made it for you to do your work, take care of things at home, or get along with other people    Depression Interventions/Treatment      Vitals:   10/10/24 1346 10/11/24 1345  BP: 131/79 (!) 179/96    Medications Reviewed Today     Reviewed by Bertrum Rosina HERO, RN (Registered Nurse) on 10/11/24 at 1339  Med List Status: <None>   Medication Order Taking? Sig Documenting Provider Last Dose Status Informant  Abatacept  (ORENCIA  CLICKJECT) 125 MG/ML  SOAJ [id number]  Inject 125 mg into the skin once a week. Dolphus Reiter, MD  Active   acetaminophen  (TYLENOL ) 650 MG CR tablet [id number]  Take 1,300 mg by mouth every 8  (eight) hours as needed for pain. [provider]  Active Self, Pharmacy Records  apixaban  (ELIQUIS ) 5 MG TABS tablet [id number]  Take 1 tablet (5 mg total) by mouth 2 (two) times daily. Pasam, Avinash, MD  Active   betamethasone dipropionate (DIPROLENE) 0.05 % ointment [id number]  Apply topically as needed. [provider]  Active Self, Pharmacy Records  Calcium -Magnesium-Zinc (CAL-MAG-ZINC PO) [id number]  Take 1 tablet by mouth daily. [provider]  Active Self, Pharmacy Records  cyanocobalamin  (VITAMIN B12) 1000 MCG tablet [id number]  Take 1 tablet by mouth daily.  Patient not taking: Reported on 09/13/2024   [provider]  Active Self, Pharmacy Records  Ergocalciferol  (VITAMIN D2) 10 MCG (400 UNIT) TABS [id number]  Take 1 tablet by mouth daily. [provider]  Active Self, Pharmacy Records  fluticasone  (FLONASE ) 50 MCG/ACT nasal spray [id number]  USE 2 SPRAYS IN EACH NOSTRIL DAILY Gottschalk, Ashly M, DO  Active Self, Pharmacy Records  gabapentin  (NEURONTIN ) 600 MG tablet [id number]  Take 0.5 tablets (300 mg total) by mouth at bedtime for 21 days. Then stop  Patient taking differently: Take 600 mg by mouth at bedtime. Then stop   Jolinda Norene HERO, DO  Expired 09/13/24 2359   Iron , Ferrous Sulfate , 325 (65 Fe) MG TABS [id number]  Take 325 mg by mouth daily. Severa Rock HERO, FNP  Active   leflunomide  (ARAVA ) 20 MG tablet [id number]  TAKE ONE (1) TABLET BY MOUTH EVERY DAY Deveshwar, Reiter, MD  Active   losartan  (COZAAR ) 50 MG tablet [id number]  Take 1 tablet (50 mg total) by mouth daily. Jolinda Norene M, DO  Active   Multiple Vitamin (MULTIVITAMIN PO) [id number]  Take 1 tablet by mouth daily. [provider]  Active Self, Pharmacy Records  oxyCODONE -acetaminophen  (PERCOCET/ROXICET) 5-325 MG tablet [id number]  Take 1 tablet by mouth every 6 (six) hours as needed for severe pain (pain score 7-10).  Patient not taking: Reported on 09/13/2024    Mesner, Selinda, MD  Active   oxyCODONE -acetaminophen  (PERCOCET/ROXICET) 5-325 MG tablet [id number]  Take 1 tablet by mouth every 6 (six) hours as needed for severe pain (pain score 7-10). Dean Clarity, MD  Active   rosuvastatin  (CRESTOR ) 10 MG tablet [id number]  Take 1 tablet (10 mg total) by mouth daily. Jolinda Norene HERO, DO  Active Self, Pharmacy Records  UNABLE TO FIND [id number]  Med Name: CBD roll on, as needed [provider]  Active Self, Pharmacy Records  VITAMIN E  PO [id number]  Take 1 capsule by mouth daily. [provider]  Active Self, Pharmacy Records            Recommendation:   Continue Current Plan of Care  Follow Up Plan:   Telephone follow-up in 1 month  Rosina Forte, BSN RN [facility], [facility] RN Care Manager Direct Dial: [phone]  Fax: [phone]

## 2024-10-12 ENCOUNTER — Ambulatory Visit: Payer: Self-pay

## 2024-10-12 NOTE — Telephone Encounter (Signed)
 FYI Only or Action Required?: Action required by provider: clinical question for provider and update on patient condition.  Patient was last seen in primary care on 09/06/2024 by Deitra Morton Sebastian Nena, NP.  Called Nurse Triage reporting No chief complaint on file..  Symptoms began about a month ago.  Interventions attempted: Prescription medications: Taking her blood pressure medication at night.  Symptoms are: unchanged.  Triage Disposition: See PCP When Office is Open (Within 3 Days)  Patient/caregiver understands and will follow disposition?: Yes with modifications, earliest appointment made for when patient will have transportation     Copied from CRM #[id number]. Topic: Clinical - Red Word Triage >> Oct 12, 2024  9:22 AM Geneva B wrote: [facility] that prompted transfer to Nurse Triage: blood pressure high 178/109       Reason for Disposition  Systolic BP >= 160 OR Diastolic >= 100  Answer Assessment - Initial Assessment Questions Patient unable to come in for an appointment until next week due to lack of transportation. Patient would like to know if her blood pressure medication could be adjusted prior to that appointment. Please advise.    1. BLOOD PRESSURE: What is your blood pressure? Did you take at least two measurements 5 minutes apart?     178/109 2. ONSET: When did you take your blood pressure?     Prior to calling, has had similar problems for the last month  3. HOW: How did you take your blood pressure? (e.g., automatic home BP monitor, visiting nurse)     Automatic BP cuff 4. HISTORY: Do you have a history of high blood pressure?     Yes 5. MEDICINES: Are you taking any medicines for blood pressure? Have you missed any doses recently?     Has not missed a dose  6. OTHER SYMPTOMS: Do you have any symptoms? (e.g., blurred vision, chest pain, difficulty breathing, headache, weakness)     None  Protocols used: Blood Pressure - High-A-AH

## 2024-10-12 NOTE — Telephone Encounter (Signed)
 Noted. Appt scheduled 11/13

## 2024-10-15 ENCOUNTER — Other Ambulatory Visit: Payer: Self-pay | Admitting: Rheumatology

## 2024-10-15 ENCOUNTER — Other Ambulatory Visit: Payer: Self-pay

## 2024-10-15 DIAGNOSIS — M0579 Rheumatoid arthritis with rheumatoid factor of multiple sites without organ or systems involvement: Secondary | ICD-10-CM

## 2024-10-15 DIAGNOSIS — Z79899 Other long term (current) drug therapy: Secondary | ICD-10-CM

## 2024-10-15 MED ORDER — ORENCIA CLICKJECT 125 MG/ML ~~LOC~~ SOAJ
125.0000 mg | SUBCUTANEOUS | 0 refills | Status: DC
  Filled 2024-10-15: qty 12, 84d supply, fill #0
  Filled 2024-10-17: qty 4, 28d supply, fill #0
  Filled 2024-11-08: qty 4, 28d supply, fill #1
  Filled 2024-12-10: qty 4, 28d supply, fill #2

## 2024-10-15 NOTE — Telephone Encounter (Signed)
 Last Fill: 08/02/2024  Labs: 08/28/2024 Hgb 10.5, Hct 33.5, RDW 17.2, Calcium  8.5, Albumin 2.6  TB Gold: 07/27/2024 Neg    Next Visit: 02/19/2025  Last Visit: 09/13/2024  IK:Myzlfjunpi arthritis involving multiple sites with positive rheumatoid factor   Current Dose per office note 09/13/2024: Orencia  125mg  sq injections once weekly   Okay to refill Orencia ?

## 2024-10-16 ENCOUNTER — Other Ambulatory Visit: Payer: Self-pay

## 2024-10-17 ENCOUNTER — Other Ambulatory Visit: Payer: Self-pay

## 2024-10-17 ENCOUNTER — Other Ambulatory Visit: Payer: Self-pay | Admitting: Pharmacy Technician

## 2024-10-17 NOTE — Progress Notes (Signed)
 Specialty Pharmacy Refill Coordination Note  Jean Howell is a 68 y.o. female contacted today regarding refills of specialty medication(s) Abatacept  (Orencia  ClickJect)   Patient requested Delivery   Delivery date: 10/19/24   Verified address: [address]  [place]   Medication will be filled on: 10/18/24

## 2024-10-18 ENCOUNTER — Ambulatory Visit (INDEPENDENT_AMBULATORY_CARE_PROVIDER_SITE_OTHER): Admitting: Nurse Practitioner

## 2024-10-18 ENCOUNTER — Encounter: Payer: Self-pay | Admitting: Nurse Practitioner

## 2024-10-18 VITALS — BP 144/86 | HR 91 | Temp 97.5°F | Ht 63.0 in | Wt 193.0 lb

## 2024-10-18 DIAGNOSIS — I10 Essential (primary) hypertension: Secondary | ICD-10-CM

## 2024-10-18 MED ORDER — LOSARTAN POTASSIUM 100 MG PO TABS: 100.0000 mg | ORAL_TABLET | Freq: Every day | ORAL | 1 refills | Status: DC

## 2024-10-18 NOTE — Progress Notes (Signed)
 Subjective:    Patient ID: Jean Howell, female    DOB: 05-17-1956, 68 y.o.   MRN: [id number]   Chief Complaint: hypertension  HPI  Patient was seen for annual physical on 08/06/24. Blood pressure was 150/97.note stating elevated blood pressure possibly due to pain at that time. Patient has been keeping a check of blood pressure at home and has been running around 170's systolic.  Patient Active Problem List   Diagnosis Date Noted   Left leg pain 09/06/2024   Hospital discharge follow-up 09/06/2024   Orthostatic hypotension 07/10/2024   Postural dizziness with presyncope 07/10/2024   History of pulmonary embolism 07/10/2024   History of DVT (deep vein thrombosis) 07/10/2024   Generalized anxiety disorder 07/10/2024   Chronic sciatica 07/10/2024   Healthcare maintenance 06/19/2024   DVT (deep venous thrombosis) (HCC) 05/12/2024   Pulmonary emboli (HCC) 05/11/2024   Mild aortic regurgitation 03/15/2024   HLD (hyperlipidemia) 12/27/2022   Other intervertebral disc degeneration, lumbar region 11/18/2022   History of total right knee replacement 07/05/2022   Osteopenia of neck of right femur 03/16/2022   Impaired mobility and endurance 06/16/2020   Sciatica, left side 06/16/2020   Rheumatoid arthritis involving multiple sites with positive rheumatoid factor (HCC) 03/06/2019   Degenerative arthritis of knee, bilateral 03/06/2019   Former smoker 03/06/2019   ANA positive 02/12/2019   Morbid obesity (HCC) 01/08/2019   Essential hypertension 01/08/2019       Review of Systems  Constitutional:  Negative for diaphoresis.  Eyes:  Negative for pain.  Respiratory:  Negative for shortness of breath.   Cardiovascular:  Negative for chest pain, palpitations and leg swelling.  Gastrointestinal:  Negative for abdominal pain.  Endocrine: Negative for polydipsia.  Skin:  Negative for rash.  Neurological:  Negative for dizziness, weakness and headaches.  Hematological:  Does not  bruise/bleed easily.  All other systems reviewed and are negative.      Objective:   Physical Exam Vitals and nursing note reviewed.  Constitutional:      General: She is not in acute distress.    Appearance: Normal appearance. She is well-developed.  Neck:     Vascular: No carotid bruit or JVD.  Cardiovascular:     Rate and Rhythm: Normal rate and regular rhythm.     Heart sounds: Normal heart sounds.  Pulmonary:     Effort: Pulmonary effort is normal. No respiratory distress.     Breath sounds: Normal breath sounds. No wheezing or rales.  Chest:     Chest wall: No tenderness.  Abdominal:     General: Bowel sounds are normal. There is no distension or abdominal bruit.     Palpations: Abdomen is soft. There is no hepatomegaly, splenomegaly, mass or pulsatile mass.     Tenderness: There is no abdominal tenderness.  Musculoskeletal:        General: Normal range of motion.     Cervical back: Normal range of motion and neck supple.  Lymphadenopathy:     Cervical: No cervical adenopathy.  Skin:    General: Skin is warm and dry.  Neurological:     Mental Status: She is alert and oriented to person, place, and time.     Deep Tendon Reflexes: Reflexes are normal and symmetric.  Psychiatric:        Behavior: Behavior normal.        Thought Content: Thought content normal.        Judgment: Judgment normal.     BP ROLLEN)  144/86   Pulse 91   Temp (!) 97.5 F (36.4 C) (Temporal)   Ht 5' 3 (1.6 m)   Wt 193 lb (87.5 kg)   SpO2 95%   BMI 34.19 kg/m        Assessment & Plan:   Jean Howell in today with chief complaint of BP running high at home   1. Essential hypertension (Primary) Increase losartan  to 100mg  daily Dash diet Continue to keep diary of blood pressure at home.keep follow up appt with PCP  Meds ordered this encounter  Medications   losartan  (COZAAR ) 100 MG tablet    Sig: Take 1 tablet (100 mg total) by mouth daily.    Dispense:  90 tablet    Refill:   1    Supervising Provider:   MARYANNE CHEW A [[id number]]       The above assessment and management plan was discussed with the patient. The patient verbalized understanding of and has agreed to the management plan. Patient is aware to call the clinic if symptoms persist or worsen. Patient is aware when to return to the clinic for a follow-up visit. Patient educated on when it is appropriate to go to the emergency department.   Jean Gladis, FNP

## 2024-10-18 NOTE — Patient Instructions (Signed)

## 2024-10-29 ENCOUNTER — Other Ambulatory Visit: Payer: Self-pay

## 2024-10-29 NOTE — Progress Notes (Signed)
 Specialty Pharmacy Ongoing Clinical Assessment Note  Jean Howell is a 68 y.o. female who is being followed by the specialty pharmacy service for RxSp Rheumatoid Arthritis   Patient's specialty medication(s) reviewed today: Abatacept  (Orencia  ClickJect)   Missed doses in the last 4 weeks: 0   Patient/Caregiver did not have any additional questions or concerns.   Therapeutic benefit summary: Patient is achieving benefit   Adverse events/side effects summary: No adverse events/side effects   Patient's therapy is appropriate to: Continue    Goals Addressed             This Visit's Progress    Reduce inflammation   On track    Patient is on track. Patient will maintain adherence         Follow up: 12 months  Aviv Rota M Jaquay Morneault Specialty Pharmacist

## 2024-11-08 ENCOUNTER — Other Ambulatory Visit (HOSPITAL_COMMUNITY): Payer: Self-pay

## 2024-11-09 ENCOUNTER — Other Ambulatory Visit: Payer: Self-pay | Admitting: *Deleted

## 2024-11-09 NOTE — Patient Outreach (Signed)
 Complex Care Management   Visit Note  11/09/2024  Name:  Jean Howell MRN: [id number] DOB: [birth year]  Situation: Referral received for Complex Care Management related to HTN I obtained verbal consent from Patient.  Visit completed with Patient  on the phone  Background:   Past Medical History:  Diagnosis Date   Arthritis    Depression    Hypertension    Rheumatoid arthritis (HCC)     Assessment: Patient Reported Symptoms:  Cognitive Cognitive Status: No symptoms reported Cognitive/Intellectual Conditions Management [RPT]: None reported or documented in medical history or problem list   Health Maintenance Behaviors: Annual physical exam Healing Pattern: Average Health Facilitated by: Rest  Neurological Neurological Review of Symptoms: No symptoms reported Neurological Management Strategies: Routine screening Neurological Self-Management Outcome: 4 (good)  HEENT HEENT Symptoms Reported: No symptoms reported HEENT Management Strategies: Routine screening HEENT Self-Management Outcome: 4 (good)    Cardiovascular Cardiovascular Symptoms Reported: No symptoms reported Does patient have uncontrolled Hypertension?: Yes Is patient checking Blood Pressure at home?: Yes Patient's Recent BP reading at home: 187/104 (patient reported) Cardiovascular Management Strategies: Routine screening, Medication therapy Cardiovascular Self-Management Outcome: 3 (uncertain)  Respiratory Respiratory Symptoms Reported: No symptoms reported Respiratory Management Strategies: Routine screening Respiratory Self-Management Outcome: 4 (good)  Endocrine Endocrine Symptoms Reported: No symptoms reported Is patient diabetic?: No Endocrine Self-Management Outcome: 4 (good)  Gastrointestinal Gastrointestinal Symptoms Reported: No symptoms reported Gastrointestinal Self-Management Outcome: 4 (good)    Genitourinary Genitourinary Symptoms Reported: No symptoms reported Genitourinary Self-Management  Outcome: 4 (good)  Integumentary Integumentary Symptoms Reported: Rash Skin Management Strategies: Routine screening Skin Self-Management Outcome: 4 (good)  Musculoskeletal Musculoskelatal Symptoms Reviewed: Joint pain Musculoskeletal Management Strategies: Routine screening Musculoskeletal Self-Management Outcome: 3 (uncertain) Falls in the past year?: No Number of falls in past year: 1 or less Was there an injury with Fall?: No Fall Risk Category Calculator: 0 Patient Fall Risk Level: Low Fall Risk Patient at Risk for Falls Due to: No Fall Risks Fall risk Follow up: Falls evaluation completed  Psychosocial Psychosocial Symptoms Reported: No symptoms reported Behavioral Management Strategies: Support system Behavioral Health Self-Management Outcome: 4 (good) Major Change/Loss/Stressor/Fears (CP): Denies Techniques to Cope with Loss/Stress/Change: Not applicable      11/09/2024    PHQ2-9 Depression Screening   Little interest or pleasure in doing things    Feeling down, depressed, or hopeless    PHQ-2 - Total Score    Trouble falling or staying asleep, or sleeping too much    Feeling tired or having little energy    Poor appetite or overeating     Feeling bad about yourself - or that you are a failure or have let yourself or your family down    Trouble concentrating on things, such as reading the newspaper or watching television    Moving or speaking so slowly that other people could have noticed.  Or the opposite - being so fidgety or restless that you have been moving around a lot more than usual    Thoughts that you would be better off dead, or hurting yourself in some way    PHQ2-9 Total Score    If you checked off any problems, how difficult have these problems made it for you to do your work, take care of things at home, or get along with other people    Depression Interventions/Treatment      Today's Vitals   11/09/24 1354  BP: (!) 169/105  Pulse:    Pain Score: 0-No  pain  Medications Reviewed Today     Reviewed by Bertrum Rosina HERO, RN (Registered Nurse) on 11/09/24 at 1342  Med List Status: <None>   Medication Order Taking? Sig Documenting Provider Last Dose Status Informant  Abatacept  (ORENCIA  CLICKJECT) 125 MG/ML SOAJ [id number] Yes Inject 125 mg into the skin once a week. Cheryl Waddell HERO, PA-C  Active   acetaminophen  (TYLENOL ) 650 MG CR tablet [id number] Yes Take 1,300 mg by mouth every 8 (eight) hours as needed for pain. [provider]  Active Self, Pharmacy Records  apixaban  (ELIQUIS ) 5 MG TABS tablet [id number] Yes Take 1 tablet (5 mg total) by mouth 2 (two) times daily. Pasam, Avinash, MD  Active   betamethasone dipropionate (DIPROLENE) 0.05 % ointment [id number]  Apply topically as needed. [provider]  Active Self, Pharmacy Records  Calcium -Magnesium-Zinc (CAL-MAG-ZINC PO) [id number]  Take 1 tablet by mouth daily. [provider]  Active Self, Pharmacy Records  cyanocobalamin  (VITAMIN B12) 1000 MCG tablet [id number]  Take 1 tablet by mouth daily. [provider]  Active Self, Pharmacy Records  Ergocalciferol  (VITAMIN D2) 10 MCG (400 UNIT) TABS [id number] Yes Take 1 tablet by mouth daily. [provider]  Active Self, Pharmacy Records  fluticasone  (FLONASE ) 50 MCG/ACT nasal spray [id number] Yes USE 2 SPRAYS IN EACH NOSTRIL DAILY Gottschalk, Ashly M, DO  Active Self, Pharmacy Records  gabapentin  (NEURONTIN ) 600 MG tablet [id number] Yes Take 0.5 tablets (300 mg total) by mouth at bedtime for 21 days. Then stop  Patient taking differently: Take 600 mg by mouth at bedtime. Then stop   Gottschalk, Ashly M, DO  Active   Iron , Ferrous Sulfate , 325 (65 Fe) MG TABS [id number]  Take 325 mg by mouth daily.  Patient not taking: Reported on 11/09/2024   Severa Rock HERO, FNP  Active   leflunomide  (ARAVA ) 20 MG tablet [id number] Yes TAKE ONE (1) TABLET BY MOUTH EVERY DAY Dolphus Reiter, MD  Active   losartan  (COZAAR ) 100 MG  tablet [id number] Yes Take 1 tablet (100 mg total) by mouth daily. Gladis, Mary-Margaret, FNP  Active   Multiple Vitamin (MULTIVITAMIN PO) [id number] Yes Take 1 tablet by mouth daily. [provider]  Active Self, Pharmacy Records  oxyCODONE -acetaminophen  (PERCOCET/ROXICET) 5-325 MG tablet [id number] Yes Take 1 tablet by mouth every 6 (six) hours as needed for severe pain (pain score 7-10). Mesner, Selinda, MD  Active   oxyCODONE -acetaminophen  (PERCOCET/ROXICET) 5-325 MG tablet [id number]  Take 1 tablet by mouth every 6 (six) hours as needed for severe pain (pain score 7-10). Dean Clarity, MD  Active   rosuvastatin  (CRESTOR ) 10 MG tablet [id number] Yes Take 1 tablet (10 mg total) by mouth daily. Jolinda Norene HERO, DO  Active Self, Pharmacy Records  UNABLE TO FIND [id number] Yes Med Name: CBD roll on, as needed [provider]  Active Self, Pharmacy Records  VITAMIN E  PO [id number] Yes Take 1 capsule by mouth daily. [provider]  Active Self, Pharmacy Records            Recommendation:   Acute PCP follow-up Hypertension. Continue Current Plan of Care Take medications as prescribed Get at least 30 minutes exercise daily Follow DASH diet, limit sodium intake - make mindful choices   Follow Up Plan:   Telephone follow-up in 1 month  Rosina Bertrum, BSN RN [facility], [facility] RN Care Manager Direct Dial: [phone]  Fax: [phone]

## 2024-11-09 NOTE — Patient Instructions (Signed)
 Visit Information  Thank you for taking time to visit with me today. Please don't hesitate to contact me if I can be of assistance to you before our next scheduled appointment.  Your next care management appointment is by telephone on 12-10-2024 at 1:30 pm  Telephone follow-up in 1 month  Please call the care guide team at [phone] if you need to cancel, schedule, or reschedule an appointment.   Please call the Suicide and Crisis Lifeline: 988 call the [place]  National Suicide Prevention Lifeline: 661 439 4801 or TTY: (848)812-3969 TTY 509-390-9511) to talk to a trained counselor call 1-800-273-TALK (toll free, 24 hour hotline) if you are experiencing a Mental Health or Behavioral Health Crisis or need someone to talk to.  Rosina Forte, BSN RN [facility], [facility] RN Care Manager Direct Dial: [phone]  Fax: [phone]

## 2024-11-12 ENCOUNTER — Other Ambulatory Visit: Payer: Self-pay

## 2024-11-12 ENCOUNTER — Other Ambulatory Visit (HOSPITAL_COMMUNITY): Payer: Self-pay

## 2024-11-12 NOTE — Progress Notes (Signed)
 Specialty Pharmacy Refill Coordination Note  Jean Howell is a 68 y.o. female contacted today regarding refills of specialty medication(s) Abatacept  (Orencia  ClickJect)   Patient requested Delivery   Delivery date: 11/20/24   Verified address: [address]  [place]   Medication will be filled on: 11/19/24

## 2024-11-13 ENCOUNTER — Inpatient Hospital Stay: Attending: Oncology

## 2024-11-13 ENCOUNTER — Ambulatory Visit: Admitting: Oncology

## 2024-11-13 ENCOUNTER — Telehealth: Payer: Self-pay | Admitting: Oncology

## 2024-11-13 ENCOUNTER — Other Ambulatory Visit (HOSPITAL_COMMUNITY): Payer: Self-pay

## 2024-11-14 ENCOUNTER — Telehealth: Payer: Self-pay | Admitting: Oncology

## 2024-11-14 NOTE — Telephone Encounter (Signed)
 Called PT to reschedule appt, unable to leave voicemail, mailbox is full.

## 2024-11-14 NOTE — Telephone Encounter (Signed)
 Spoke with PT, day and time confirmed.

## 2024-11-17 ENCOUNTER — Other Ambulatory Visit: Payer: Self-pay | Admitting: Family Medicine

## 2024-11-17 DIAGNOSIS — M5432 Sciatica, left side: Secondary | ICD-10-CM

## 2024-11-19 ENCOUNTER — Other Ambulatory Visit: Payer: Self-pay

## 2024-11-21 ENCOUNTER — Encounter: Payer: Self-pay | Admitting: Family Medicine

## 2024-11-21 ENCOUNTER — Ambulatory Visit: Admitting: Family Medicine

## 2024-11-21 ENCOUNTER — Other Ambulatory Visit: Payer: Self-pay | Admitting: *Deleted

## 2024-11-21 VITALS — BP 129/85 | HR 91 | Temp 97.1°F | Ht 63.0 in | Wt 194.0 lb

## 2024-11-21 DIAGNOSIS — D649 Anemia, unspecified: Secondary | ICD-10-CM

## 2024-11-21 DIAGNOSIS — Z23 Encounter for immunization: Secondary | ICD-10-CM | POA: Diagnosis not present

## 2024-11-21 DIAGNOSIS — Z79899 Other long term (current) drug therapy: Secondary | ICD-10-CM

## 2024-11-21 DIAGNOSIS — I10 Essential (primary) hypertension: Secondary | ICD-10-CM

## 2024-11-21 DIAGNOSIS — I2699 Other pulmonary embolism without acute cor pulmonale: Secondary | ICD-10-CM | POA: Diagnosis not present

## 2024-11-21 LAB — BASIC METABOLIC PANEL WITH GFR
BUN/Creatinine Ratio: 11 — ABNORMAL LOW (ref 12–28)
BUN: 9 mg/dL (ref 8–27)
CO2: 23 mmol/L (ref 20–29)
Calcium: 9.3 mg/dL (ref 8.7–10.3)
Chloride: 104 mmol/L (ref 96–106)
Creatinine, Ser: 0.85 mg/dL (ref 0.57–1.00)
Glucose: 90 mg/dL (ref 70–99)
Potassium: 4.2 mmol/L (ref 3.5–5.2)
Sodium: 141 mmol/L (ref 134–144)
eGFR: 74 mL/min/1.73 (ref >59)

## 2024-11-21 NOTE — Progress Notes (Signed)
 Subjective: CC:HTN PCP: Jean Howell, Jean Howell YEP:Jean Howell is a 68 y.o. female presenting to clinic today for:  Losartan  increased to 100mg  last month. She present today and notes that she had a recent visit by a home health nurse and her blood pressure apparently was elevated.  She was taking her losartan  at bedtime because she been told to Jean Howell that previously.  However after that visit she started taking it back in the morning again.  Home blood pressures have been relatively well-controlled.  She denies any chest pain, shortness of breath, visual disturbance.  ROS: Per HPI  Allergies[1] Past Medical History:  Diagnosis Date   Arthritis    Depression    Hypertension    Rheumatoid arthritis (HCC)    Current Medications[2] Social History   Socioeconomic History   Marital status: Widowed    Spouse name: Not on file   Number of children: 1   Years of education: 12th   Highest education level: GED or equivalent  Occupational History   Occupation: retired  Tobacco Use   Smoking status: Former    Current packs/day: 0.00    Average packs/day: 1 pack/day for 15.0 years (15.0 ttl pk-yrs)    Types: Cigarettes    Start date: 49    Quit date: 2000    Years since quitting: 25.9    Passive exposure: Past   Smokeless tobacco: Never  Vaping Use   Vaping status: Never Used  Substance and Sexual Activity   Alcohol use: Not Currently   Drug use: Never   Sexual activity: Not Currently    Comment: widowed  Other Topics Concern   Not on file  Social History Narrative   Patient is a widow that resides independently in [place].  She has 1 son, who resides in [place].   Social Drivers of Health   Tobacco Use: Medium Risk (10/18/2024)   Patient History    Smoking Tobacco Use: Former    Smokeless Tobacco Use: Never    Passive Exposure: Past  Physicist, Medical Strain: Low Risk (07/13/2024)   Overall Financial Resource Strain (CARDIA)    Difficulty of Paying Living  Expenses: Not very hard  Recent Concern: Financial Resource Strain - Medium Risk (05/16/2024)   Overall Financial Resource Strain (CARDIA)    Difficulty of Paying Living Expenses: Somewhat hard  Food Insecurity: No Food Insecurity (08/10/2024)   Epic    Worried About Programme Researcher, Broadcasting/film/video in the Last Year: Never true    Ran Out of Food in the Last Year: Never true  Recent Concern: Food Insecurity - Food Insecurity Present (07/11/2024)   Epic    Worried About Programme Researcher, Broadcasting/film/video in the Last Year: Sometimes true    Ran Out of Food in the Last Year: Never true  Transportation Needs: No Transportation Needs (09/20/2024)   Epic    Lack of Transportation (Medical): No    Lack of Transportation (Non-Medical): No  Physical Activity: Insufficiently Active (06/25/2024)   Exercise Vital Sign    Days of Exercise per Week: 7 days    Minutes of Exercise per Session: 10 min  Stress: No Stress Concern Present (09/20/2024)   [employer] of Occupational Health - Occupational Stress Questionnaire    Feeling of Stress: Not at all  Recent Concern: Stress - Stress Concern Present (06/25/2024)   [employer] of Occupational Health - Occupational Stress Questionnaire    Feeling of Stress: Very much  Social Connections: Socially Isolated (07/11/2024)  Social Advertising Account Executive    Frequency of Communication with Friends and Family: Never    Frequency of Social Gatherings with Friends and Family: Never    Attends Religious Services: Never    Database Administrator or Organizations: Not on file    Attends Banker Meetings: Never    Marital Status: Widowed  Intimate Partner Violence: Not At Risk (07/18/2024)   Epic    Fear of Current or Ex-Partner: No    Emotionally Abused: No    Physically Abused: No    Sexually Abused: No  Depression (PHQ2-9): Low Risk (10/18/2024)   Depression (PHQ2-9)    PHQ-2 Score: 0  Recent Concern: Depression (PHQ2-9) - Medium Risk (08/10/2024)    Depression (PHQ2-9)    PHQ-2 Score: 7  Alcohol Screen: Low Risk (06/23/2023)   Alcohol Screen    Last Alcohol Screening Score (AUDIT): 0  Housing: Low Risk (08/10/2024)   Epic    Unable to Pay for Housing in the Last Year: No    Number of Times Moved in the Last Year: 0    Homeless in the Last Year: No  Utilities: Not At Risk (07/18/2024)   Epic    Threatened with loss of utilities: No  Health Literacy: Adequate Health Literacy (06/25/2024)   B1300 Health Literacy    Frequency of need for help with medical instructions: Never   Family History  Problem Relation Age of Onset   Thyroid  disease Mother    CVA Mother 35   Cancer Father    Colon cancer Father    Thyroid  disease Sister    Congestive Heart Failure Sister    Hypertension Sister    Diabetes Sister    Hypertension Sister    Diabetes Sister    Hypertension Sister    Hypertension Sister    Cancer Brother        kidney    Healthy Son    Thyroid  disease Niece    Breast cancer Neg Hx     Objective: Office vital signs reviewed. BP 129/85   Pulse 91   Temp (!) 97.1 F (36.2 C)   Ht 5' 3 (1.6 m)   Wt 194 lb (88 kg)   SpO2 96%   BMI 34.37 kg/m   Physical Examination:  General: Awake, alert, nontoxic elderly female, No acute distress HEENT: Sclera white.  Moist mucous membranes Cardio: regular rate and rhythm, S1S2 heard, no murmurs appreciated Pulm: clear to auscultation bilaterally, no wheezes, rhonchi or rales; normal work of breathing on room air   Assessment/ Plan: 68 y.o. female   Essential hypertension - Plan: Basic Metabolic Panel  Encounter for immunization - Plan: Flu vaccine HIGH DOSE PF(Fluzone Trivalent)   Blood pressure is under good control with current regimen.  Not sure what caused the outlier but I see no reason to change her medications at this time.  I will check BMP given increase in ARB.  Additionally, it appears that the lab attempted to collect her labs for her upcoming hematology visit  but were unsuccessful.  We did not discuss any of her hematologic issues in detail today   Jean Howell, Jean Howell [facility] [phone]     [1]  Allergies Allergen Reactions   Electrodes 25mm [Tens Therapy Replace Back Pads] Rash    EKG electrodes   Enbrel  [Etanercept ] Rash    Psoriasis  [2]  Current Outpatient Medications:    Abatacept  (ORENCIA  CLICKJECT) 125 MG/ML SOAJ, Inject  125 mg into the skin once a week., Disp: 12 mL, Rfl: 0   acetaminophen  (TYLENOL ) 650 MG CR tablet, Take 1,300 mg by mouth every 8 (eight) hours as needed for pain., Disp: , Rfl:    apixaban  (ELIQUIS ) 5 MG TABS tablet, Take 1 tablet (5 mg total) by mouth 2 (two) times daily., Disp: 60 tablet, Rfl: 3   betamethasone dipropionate (DIPROLENE) 0.05 % ointment, Apply topically as needed., Disp: , Rfl:    Calcium -Magnesium-Zinc (CAL-MAG-ZINC PO), Take 1 tablet by mouth daily., Disp: , Rfl:    cyanocobalamin  (VITAMIN B12) 1000 MCG tablet, Take 1 tablet by mouth daily., Disp: , Rfl:    Ergocalciferol  (VITAMIN D2) 10 MCG (400 UNIT) TABS, Take 1 tablet by mouth daily., Disp: , Rfl:    fluticasone  (FLONASE ) 50 MCG/ACT nasal spray, USE 2 SPRAYS IN EACH NOSTRIL DAILY, Disp: 16 g, Rfl: 6   gabapentin  (NEURONTIN ) 600 MG tablet, TAKE ONE TABLET BY MOUTH AT BEDTIME, Disp: 90 tablet, Rfl: 0   Iron , Ferrous Sulfate , 325 (65 Fe) MG TABS, Take 325 mg by mouth daily. (Patient not taking: Reported on 11/09/2024), Disp: 30 tablet, Rfl: 1   leflunomide  (ARAVA ) 20 MG tablet, TAKE ONE (1) TABLET BY MOUTH EVERY DAY, Disp: 90 tablet, Rfl: 0   losartan  (COZAAR ) 100 MG tablet, Take 1 tablet (100 mg total) by mouth daily., Disp: 90 tablet, Rfl: 1   Multiple Vitamin (MULTIVITAMIN PO), Take 1 tablet by mouth daily., Disp: , Rfl:    oxyCODONE -acetaminophen  (PERCOCET/ROXICET) 5-325 MG tablet, Take 1 tablet by mouth every 6 (six) hours as needed for severe pain (pain score 7-10)., Disp: 6 tablet, Rfl: 0    oxyCODONE -acetaminophen  (PERCOCET/ROXICET) 5-325 MG tablet, Take 1 tablet by mouth every 6 (six) hours as needed for severe pain (pain score 7-10)., Disp: 10 tablet, Rfl: 0   rosuvastatin  (CRESTOR ) 10 MG tablet, Take 1 tablet (10 mg total) by mouth daily., Disp: 90 tablet, Rfl: 3   UNABLE TO FIND, Med Name: CBD roll on, as needed, Disp: , Rfl:    VITAMIN E  PO, Take 1 capsule by mouth daily., Disp: , Rfl:

## 2024-11-21 NOTE — Addendum Note (Signed)
 Addended by: ARLYN FANG D on: 11/21/2024 10:27 AM   Modules accepted: Orders

## 2024-11-21 NOTE — Addendum Note (Signed)
 Addended by: ARLYN FANG D on: 11/21/2024 10:26 AM   Modules accepted: Orders

## 2024-11-21 NOTE — Patient Instructions (Signed)
Blood pressure looks great

## 2024-11-22 ENCOUNTER — Telehealth: Payer: Self-pay | Admitting: Pharmacist

## 2024-11-22 ENCOUNTER — Ambulatory Visit: Payer: Self-pay | Admitting: Rheumatology

## 2024-11-22 LAB — CMP14+EGFR
ALT: 11 IU/L (ref 0–32)
AST: 20 IU/L (ref 0–40)
Albumin: 3.5 g/dL — ABNORMAL LOW (ref 3.9–4.9)
Alkaline Phosphatase: 71 IU/L (ref 49–135)
BUN/Creatinine Ratio: 11 — ABNORMAL LOW (ref 12–28)
BUN: 9 mg/dL (ref 8–27)
Bilirubin Total: 0.3 mg/dL (ref 0.0–1.2)
CO2: 23 mmol/L (ref 20–29)
Calcium: 9.2 mg/dL (ref 8.7–10.3)
Chloride: 103 mmol/L (ref 96–106)
Creatinine, Ser: 0.81 mg/dL (ref 0.57–1.00)
Globulin, Total: 3.7 g/dL (ref 1.5–4.5)
Glucose: 90 mg/dL (ref 70–99)
Potassium: 4.2 mmol/L (ref 3.5–5.2)
Sodium: 140 mmol/L (ref 134–144)
Total Protein: 7.2 g/dL (ref 6.0–8.5)
eGFR: 78 mL/min/1.73 (ref >59)

## 2024-11-22 LAB — CBC WITH DIFFERENTIAL/PLATELET
Basophils Absolute: 0 x10E3/uL (ref 0.0–0.2)
Basos: 0 %
EOS (ABSOLUTE): 0.1 x10E3/uL (ref 0.0–0.4)
Eos: 2 %
Hematocrit: 38.2 % (ref 34.0–46.6)
Hemoglobin: 11.6 g/dL (ref 11.1–15.9)
Immature Grans (Abs): 0 x10E3/uL (ref 0.0–0.1)
Immature Granulocytes: 0 %
Lymphocytes Absolute: 1.2 x10E3/uL (ref 0.7–3.1)
Lymphs: 29 %
MCH: 26.3 pg — ABNORMAL LOW (ref 26.6–33.0)
MCHC: 30.4 g/dL — ABNORMAL LOW (ref 31.5–35.7)
MCV: 87 fL (ref 79–97)
Monocytes Absolute: 0.1 x10E3/uL (ref 0.1–0.9)
Monocytes: 3 %
Neutrophils Absolute: 2.7 x10E3/uL (ref 1.4–7.0)
Neutrophils: 66 %
Platelets: 372 x10E3/uL (ref 150–450)
RBC: 4.41 x10E6/uL (ref 3.77–5.28)
RDW: 15.6 % — ABNORMAL HIGH (ref 11.7–15.4)
WBC: 4.1 x10E3/uL (ref 3.4–10.8)

## 2024-11-22 NOTE — Telephone Encounter (Signed)
 Received letter from [facility] stating Orencia  will no longer be on formulary in 2026  Submitted a Prior Authorization request to [employer] for ORENCIA  SQ via CoverMyMeds. Will update once we receive a response.  Key: [id number]

## 2024-11-22 NOTE — Progress Notes (Signed)
 CBC and CMP are stable.

## 2024-11-23 ENCOUNTER — Ambulatory Visit: Payer: Self-pay | Admitting: Family Medicine

## 2024-11-23 NOTE — Telephone Encounter (Signed)
 Pt called and verbalized understanding.

## 2024-11-25 LAB — IRON AND TIBC
Iron Saturation: 11 % — ABNORMAL LOW (ref 15–55)
Iron: 31 ug/dL (ref 27–139)
Total Iron Binding Capacity: 282 ug/dL (ref 250–450)
UIBC: 251 ug/dL (ref 118–369)

## 2024-11-25 LAB — D-DIMER, QUANTITATIVE: D-DIMER: 4.84 mg{FEU}/L — AB (ref 0.00–0.49)

## 2024-11-25 LAB — ANTITHROMBIN PANEL
AT III AG PPP IMM-ACNC: 249 % — AB (ref 72–124)
AntiThromb III Func: 133 % (ref 75–135)

## 2024-11-25 LAB — BETA-2-GLYCOPROTEIN I ABS, IGG/M/A
Beta-2 Glyco 1 IgA: <9 (ref 0–25)
Beta-2 Glyco 1 IgM: 20 (ref 0–32)
Beta-2 Glyco I IgG: 27 — AB (ref 0–20)

## 2024-11-25 LAB — PROTEIN S PANEL
Protein S Activity: 53 % — AB (ref 63–140)
Protein S Ag, Free: 71 % (ref 61–136)
Protein S Ag, Total: 251 % — AB (ref 60–150)

## 2024-11-25 LAB — PROTEIN C ACTIVITY: Protein C Activity: 115 % (ref 73–180)

## 2024-11-25 LAB — FERRITIN: Ferritin: 262 ng/mL — ABNORMAL HIGH (ref 15–150)

## 2024-11-25 LAB — CARDIOLIPIN ANTIBODIES, IGG, IGM, IGA
Anticardiolipin IgA: 14 U/mL — AB (ref 0–11)
Anticardiolipin IgG: <9 (ref 0–14)
Anticardiolipin IgM: 104 [MPL'U]/mL — AB (ref 0–12)

## 2024-11-27 ENCOUNTER — Encounter: Payer: Self-pay | Admitting: Oncology

## 2024-11-27 ENCOUNTER — Inpatient Hospital Stay (HOSPITAL_BASED_OUTPATIENT_CLINIC_OR_DEPARTMENT_OTHER): Admitting: Oncology

## 2024-11-27 ENCOUNTER — Other Ambulatory Visit: Payer: Self-pay | Admitting: Oncology

## 2024-11-27 ENCOUNTER — Inpatient Hospital Stay: Attending: Oncology

## 2024-11-27 VITALS — BP 156/69 | HR 84 | Temp 97.8°F | Resp 18 | Wt 182.6 lb

## 2024-11-27 DIAGNOSIS — Z86718 Personal history of other venous thrombosis and embolism: Secondary | ICD-10-CM | POA: Insufficient documentation

## 2024-11-27 DIAGNOSIS — R600 Localized edema: Secondary | ICD-10-CM | POA: Diagnosis not present

## 2024-11-27 DIAGNOSIS — M79604 Pain in right leg: Secondary | ICD-10-CM | POA: Insufficient documentation

## 2024-11-27 DIAGNOSIS — R7989 Other specified abnormal findings of blood chemistry: Secondary | ICD-10-CM | POA: Diagnosis not present

## 2024-11-27 DIAGNOSIS — R7689 Other specified abnormal immunological findings in serum: Secondary | ICD-10-CM | POA: Diagnosis present

## 2024-11-27 DIAGNOSIS — Z86711 Personal history of pulmonary embolism: Secondary | ICD-10-CM | POA: Insufficient documentation

## 2024-11-27 DIAGNOSIS — Z7901 Long term (current) use of anticoagulants: Secondary | ICD-10-CM | POA: Insufficient documentation

## 2024-11-27 DIAGNOSIS — I2699 Other pulmonary embolism without acute cor pulmonale: Secondary | ICD-10-CM

## 2024-11-27 NOTE — Progress Notes (Signed)
 "  [facility]  HEMATOLOGY CLINIC PROGRESS NOTE  PATIENT NAME: Jean Howell   MR#: [id number] DOB: [birth year]  Patient Care Team: Jolinda Norene HERO, DO as PCP - General (Family Medicine) Mallipeddi, Diannah SQUIBB, MD as PCP - Cardiology (Cardiology) Shaaron Lamar HERO, MD as Consulting Physician (Gastroenterology) Dolphus Reiter, MD as Consulting Physician (Rheumatology) Shona Rush, MD (Dermatology) Ladora Ross Lacy Phebe, MD as Referring Physician (Optometry) Jerri Kay HERO, MD as Attending Physician (Orthopedic Surgery) Bertrum Rosina HERO, RN as [facility] Care Management  Date of visit: 11/27/2024   ASSESSMENT & PLAN:   Cherye Gaertner is a 68 y.o. lady with a past medical history of hypertension, depression, arthritis, obesity, was referred to our service for evaluation after she was diagnosed with DVT and pulmonary embolism on 05/11/2024.     History of pulmonary embolism Incidentally found to have features concerning for PE on cardiac imaging, and was sent to ER on 05/11/2024.  She was found to have moderate amount of thrombus in the distal right main pulmonary artery extending to the lobar and segmental branches of the right upper and right lower lobes and borderline heart strain.  Ultrasound of the lower extremities showed left popliteal clot that appeared to be chronic.  She was briefly admitted.  Echocardiogram on 05/12/2024 did not show evidence of right heart strain.  She was treated initially with Lovenox  and transitioned over to Eliquis .   Possible contributing factors include reduced mobility due to osteoarthritis. She does have a family history of blood clots in niece and nephew.  On her consultation with us  on 06/18/2024, thrombophilia workup was pursued.  No evidence of prothrombin gene mutation, factor V Leiden mutation, lupus anticoagulant.  Anticardiolipin antibody IgM was increased at 40 units/mL and IgA was indeterminate at 15 units/mL.  Beta-2  glycoprotein IgG antibody was  increased at 28 units.  IgA and IgM were within normal limits.  D-dimer was increased >20.00.     She was advised to continue Eliquis  5 mg twice daily.   She had repeat CT angio of the chest on 07/10/2024 which showed no evidence of pulmonary embolism.  Had repeat ultrasound of the left lower extremity on 08/26/2024 in the ED and it showed no evidence of DVT.   On 11/21/2024, repeat labs showed persistently elevated anticardiolipin antibodies and IgM was actually increased at 104 U/mL, compared to 40 previously.  Beta-2  glycoprotein antibodies IgG was also increased at 27 consistently.  D-dimer was better but still elevated at 4.84.  There was no evidence of protein C, protein S or Antithrombin III deficiency.  Given persistently elevated anticardiolipin antibodies and beta-2  glycoprotein antibodies, in the context of moderate volume pulmonary embolism in the past, she was advised to remain on anticoagulation lifelong.  She was agreeable.  Plan to see her in 6 months for follow-up with repeat labs.  If she remains clinically stable, she can be discharged from our office after next visit.  Dependent edema and pain of right lower extremity New onset swelling and pain in right lower extremity, likely dependent edema from prolonged sitting or standing. No evidence of acute DVT. Symptoms improve with leg elevation. - Advise use of compression stockings up to the knees - Recommend leg elevation to reduce swelling   I spent a total of 30 minutes during this encounter with the patient including review of chart and various tests results, discussions about plan of care and coordination of care plan.  I reviewed lab results and outside records for  this visit and discussed relevant results with the patient. Diagnosis, plan of care and treatment options were also discussed in detail with the patient. Opportunity provided to ask questions and answers provided to her apparent satisfaction. Provided instructions to  call our clinic with any problems, questions or concerns prior to return visit. I recommended to continue follow-up with PCP and sub-specialists. She verbalized understanding and agreed with the plan. No barriers to learning was detected.  Jean Patten, MD  11/27/2024 4:06 PM  [facility] - A DEPT OF [facility] [address] [place] [address] Dept: [phone] Dept Fax: [phone]   CHIEF COMPLAINT/ REASON FOR VISIT:  Follow-up for history of pulmonary embolism and left lower extremity DVT, diagnosed in June 2025.  INTERVAL HISTORY:  Discussed the use of AI scribe software for clinical note transcription with the patient, who gave verbal consent to proceed.  History of Present Illness  Jean Howell is a 68 year old female with antiphospholipid syndrome and recurrent venous thromboembolism who presents for hematology follow-up and review of recent laboratory results while on lifelong anticoagulation.  She has a history of multiple thrombotic events, including pulmonary emboli involving both upper and lower lobes and a deep vein thrombosis in the leg, initially diagnosed in June. She continues to experience chronic lower extremity swelling. No new symptoms were reported at this visit.  She remains on apixaban  5 mg twice daily, with no issues regarding medication access. Recent laboratory studies show persistently positive antiphospholipid antibodies, with anti-cardiolipin antibody increasing from 40 to 104 and beta-2  glycoprotein antibody remaining positive. D-dimer has decreased from over 20 at initial presentation to 4.8, though it remains above the normal range. Recent lower extremity ultrasound demonstrated resolution of the prior deep vein thrombosis.    SUMMARY OF HEMATOLOGIC HISTORY:  She was referred by her primary doctor for evaluation of blood clots.   Incidentally found to have features  concerning for PE on cardiac imaging, and was sent to ER on 05/11/2024.  She was found to have moderate amount of thrombus in the distal right main pulmonary artery extending to the lobar and segmental branches of the right upper and right lower lobes and borderline heart strain.  Ultrasound of the lower extremities showed left popliteal clot that appeared to be chronic.  She was briefly admitted.  Echocardiogram on 05/12/2024 did not show evidence of right heart strain.  She was treated initially with Lovenox  and transitioned over to Eliquis .    She experienced episodes of syncope prior to the discovery of the clots. No recent surgeries or prolonged bed rest were reported prior to the clot discovery.   She has a history of arthritis, leading to decreased mobility over the past six years, and uses a walker at home due to arthritis-related mobility issues.   She is currently taking Eliquis  5 mg twice daily without missing any doses.   There is a family history of blood clots in her niece and nephew. Her father had colon cancer, and she underwent a colonoscopy in July 2020.   No recent surgeries or being on bed rest prior to the clots.  Incidentally found to have features concerning for PE on cardiac imaging, and was sent to ER on 05/11/2024.  She was found to have moderate amount of thrombus in the distal right main pulmonary artery extending to the lobar and segmental branches of the right upper and right lower lobes and borderline heart strain.  Ultrasound  of the lower extremities showed left popliteal clot that appeared to be chronic.  She was briefly admitted.  Echocardiogram on 05/12/2024 did not show evidence of right heart strain.  She was treated initially with Lovenox  and transitioned over to Eliquis .    Possible contributing factors include reduced mobility due to osteoarthritis. She does have a family history of blood clots in niece and nephew.   On her consultation with us  on 06/18/2024,  thrombophilia workup was pursued.  No evidence of prothrombin gene mutation, factor V Leiden mutation, lupus anticoagulant.  Anticardiolipin antibody IgM was increased at 40 units/mL and IgA was indeterminate at 15 units/mL.  Beta-2  glycoprotein IgG antibody was increased at 28 units.  IgA and IgM were within normal limits.  D-dimer was increased >20.00.   Rest of the hypercoagulable workup was deferred as it can be falsely abnormal given recent thromboembolism.    She was advised to continue Eliquis  5 mg twice daily.   She had repeat CT angio of the chest on 07/10/2024 which showed no evidence of pulmonary embolism.  Had repeat ultrasound of the left lower extremity on 08/26/2024 in the ED and it showed no evidence of DVT.   On 11/21/2024, repeat labs showed persistently elevated anticardiolipin antibodies and IgM was actually increased at 104 U/mL, compared to 40 previously.  Beta-2  glycoprotein antibodies IgG was also increased at 27 consistently.  D-dimer was better but still elevated at 4.84.  There was no evidence of protein C, protein S or Antithrombin III deficiency.  Given persistently elevated anticardiolipin antibodies and beta-2  glycoprotein antibodies, in the context of moderate volume pulmonary embolism in the past, she was advised to remain on anticoagulation lifelong.  I have reviewed the past medical history, past surgical history, social history and family history with the patient and they are unchanged from previous note.  ALLERGIES: She is allergic to electrodes 25mm [tens therapy replace back pads] and enbrel  [etanercept ].  MEDICATIONS:  Current Outpatient Medications  Medication Sig Dispense Refill   Abatacept  (ORENCIA  CLICKJECT) 125 MG/ML SOAJ Inject 125 mg into the skin once a week. 12 mL 0   acetaminophen  (TYLENOL ) 650 MG CR tablet Take 1,300 mg by mouth every 8 (eight) hours as needed for pain.     apixaban  (ELIQUIS ) 5 MG TABS tablet Take 1 tablet (5 mg total) by mouth 2  (two) times daily. 60 tablet 3   betamethasone dipropionate (DIPROLENE) 0.05 % ointment Apply topically as needed.     Calcium -Magnesium-Zinc (CAL-MAG-ZINC PO) Take 1 tablet by mouth daily.     Ergocalciferol  (VITAMIN D2) 10 MCG (400 UNIT) TABS Take 1 tablet by mouth daily.     fluticasone  (FLONASE ) 50 MCG/ACT nasal spray USE 2 SPRAYS IN EACH NOSTRIL DAILY 16 g 6   gabapentin  (NEURONTIN ) 600 MG tablet TAKE ONE TABLET BY MOUTH AT BEDTIME 90 tablet 0   Iron , Ferrous Sulfate , 325 (65 Fe) MG TABS Take 325 mg by mouth daily. 30 tablet 1   leflunomide  (ARAVA ) 20 MG tablet TAKE ONE (1) TABLET BY MOUTH EVERY DAY 90 tablet 0   losartan  (COZAAR ) 100 MG tablet Take 1 tablet (100 mg total) by mouth daily. 90 tablet 1   Multiple Vitamin (MULTIVITAMIN PO) Take 1 tablet by mouth daily.     oxyCODONE -acetaminophen  (PERCOCET/ROXICET) 5-325 MG tablet Take 1 tablet by mouth every 6 (six) hours as needed for severe pain (pain score 7-10). 6 tablet 0   oxyCODONE -acetaminophen  (PERCOCET/ROXICET) 5-325 MG tablet Take 1 tablet by mouth every  6 (six) hours as needed for severe pain (pain score 7-10). 10 tablet 0   rosuvastatin  (CRESTOR ) 10 MG tablet Take 1 tablet (10 mg total) by mouth daily. 90 tablet 3   UNABLE TO FIND Med Name: CBD roll on, as needed     VITAMIN E  PO Take 1 capsule by mouth daily.     cyanocobalamin  (VITAMIN B12) 1000 MCG tablet Take 1 tablet by mouth daily. (Patient not taking: Reported on 11/21/2024)     No current facility-administered medications for this visit.     REVIEW OF SYSTEMS:    Review of Systems - Oncology  All other pertinent systems were reviewed with the patient and are negative.  PHYSICAL EXAMINATION:   Onc Performance Status - 11/27/24 0929       ECOG Perf Status   ECOG Perf Status Ambulatory and capable of all selfcare but unable to carry out any work activities.  Up and about more than 50% of waking hours      KPS SCALE   KPS % SCORE Requires occasional assistance  but is able to care for most needs          Vitals:   11/27/24 0920  BP: (!) 156/69  Pulse: 84  Resp: 18  Temp: 97.8 F (36.6 C)  SpO2: 100%   Filed Weights   11/27/24 0920  Weight: 182 lb 9.6 oz (82.8 kg)    Physical Exam Constitutional:      General: She is not in acute distress.    Appearance: Normal appearance.  HENT:     Head: Normocephalic and atraumatic.  Cardiovascular:     Rate and Rhythm: Normal rate.  Pulmonary:     Effort: Pulmonary effort is normal. No respiratory distress.  Abdominal:     General: There is no distension.  Musculoskeletal:     Right lower leg: Edema present.     Left lower leg: Edema present.  Neurological:     General: No focal deficit present.     Mental Status: She is alert and oriented to person, place, and time.  Psychiatric:        Mood and Affect: Mood normal.        Behavior: Behavior normal.     LABORATORY DATA:   I have reviewed the data as listed.  No results found for any visits on 11/27/24.  Recent Results (from the past 2160 hours)  Ferritin     Status: Abnormal   Collection Time: 11/21/24 10:34 AM  Result Value Ref Range   Ferritin 262 (H) 15 - 150 ng/mL  Iron  and TIBC     Status: Abnormal   Collection Time: 11/21/24 10:34 AM  Result Value Ref Range   Total Iron  Binding Capacity 282 250 - 450 ug/dL   UIBC 748 881 - 630 ug/dL   Iron  31 27 - 139 ug/dL   Iron  Saturation 11 (L) 15 - 55 %  PROTEIN S PANEL     Status: Abnormal   Collection Time: 11/21/24 10:34 AM  Result Value Ref Range   Protein S Ag, Total 251 (H) 60 - 150 %    Comment: This test was developed and its performance characteristics determined by [facility]. It has not been cleared or approved by the Food and Drug Administration. Total Protein S Antigen is an acute phase reactant protein and can be elevated in inflammatory states.    Protein S Ag, Free 71 61 - 136 %   Protein S Activity 53 (L)  63 - 140 %    Comment: A deficiency of protein S  (PS), either congenital or acquired, increases the risk of thromboembolism. PS activity levels may be falsely low in individuals with APCR/Factor V Leiden. Consider performing free protein S antigen in those with APCR/Factor V Leiden before making a diagnosis of protein S deficiency. Acquired PS deficiency is more common than congenital deficiency. PS values decrease with normal pregnancy, and are also dependent on age, sex and hormone status. PS values tend to be lower in a younger age group and lower in women than in men. Levels may be decreased in pre-menopausal women on oral contraceptive agents. Acquired deficiency can occur as a result of vitamin K deficiency or antagonism, severe hepatic disorders, (hepatitis, cirrhosis, etc.), nephrotic syndrome, inflammatory bowel disease, certain chemotherapeutic agents, L-asparaginse therapy, sepsis, disseminated intravascular coagulation (DIC) and acute thrombosis. Levels may be decreased in patients  with polycythemia vera, sickle cell disease and essential thrombocythemia. Repeat evaluation on a new plasma sample to confirm or refute this result should be considered, after ruling out acquired causes, depending on the clinical scenario.   Protein C activity     Status: None   Collection Time: 11/21/24 10:34 AM  Result Value Ref Range   Protein C Activity 115 73 - 180 %  Beta-2 -glycoprotein i abs, IgG/M/A     Status: Abnormal   Collection Time: 11/21/24 10:34 AM  Result Value Ref Range   Beta-2  Glyco I IgG 27 (H) 0 - 20 GPI IgG units   Beta-2  Glyco 1 IgA <9 0 - 25 GPI IgA units   Beta-2  Glyco 1 IgM 20 0 - 32 GPI IgM units  Cardiolipin antibodies, IgG, IgM, IgA     Status: Abnormal   Collection Time: 11/21/24 10:34 AM  Result Value Ref Range   Anticardiolipin IgG <9 0 - 14 GPL U/mL    Comment:                           Negative:              <15                           Indeterminate:     15 - 20                           Low-Med  Positive: >20 - 80                           High Positive:         >80    Anticardiolipin IgM 104 (H) 0 - 12 MPL U/mL    Comment:                           Negative:              <13                           Indeterminate:     13 - 20                           Low-Med Positive: >20 - 80  High Positive:         >80    Anticardiolipin IgA 14 (H) 0 - 11 APL U/mL    Comment:                           Negative:              <12                           Indeterminate:     12 - 20                           Low-Med Positive: >20 - 80                           High Positive:         >80   D-dimer, quantitative     Status: Abnormal   Collection Time: 11/21/24 10:34 AM  Result Value Ref Range   D-DIMER 4.84 (H) 0.00 - 0.49 mg/L FEU    Comment: According to the assay manufacturer's published package insert, a normal (<0.50 mg/L FEU) D-dimer result in conjunction with a non-high clinical probability assessment, excludes deep vein thrombosis (DVT) and pulmonary embolism (PE) with high sensitivity. D-dimer values increase with age and this can make VTE exclusion of an older population difficult. To address this, the [employer], based on best available evidence and recent guidelines, recommends that clinicians use age-adjusted D-dimer thresholds in patients greater than 44 years of age with: a) a low probability of PE who do not meet all Pulmonary Embolism Rule Out Criteria, or b) in those with intermediate probability of PE. The formula for an age-adjusted D-dimer cut-off is age/100. For example, a 70 year old patient would have an age-adjusted cut-off of 0.60 mg/L FEU and an 70 year old 0.80 mg/L FEU.   Antithrombin panel     Status: Abnormal   Collection Time: 11/21/24 10:34 AM  Result Value Ref Range   AntiThromb III Func 133 75 - 135 %    Comment: Direct Xa inhibitor anticoagulants such as rivaroxaban, apixaban  and edoxaban will lead to  spuriously elevated antithrombin activity levels possibly masking a deficiency.    AT III AG PPP IMM-ACNC 249 (H) 72 - 124 %  Basic Metabolic Panel     Status: Abnormal   Collection Time: 11/21/24 10:43 AM  Result Value Ref Range   Glucose 90 70 - 99 mg/dL   BUN 9 8 - 27 mg/dL   Creatinine, Ser 9.14 0.57 - 1.00 mg/dL   eGFR 74 >40 fO/fpw/8.26   BUN/Creatinine Ratio 11 (L) 12 - 28   Sodium 141 134 - 144 mmol/L   Potassium 4.2 3.5 - 5.2 mmol/L   Chloride 104 96 - 106 mmol/L   CO2 23 20 - 29 mmol/L   Calcium  9.3 8.7 - 10.3 mg/dL  [id number]     Status: Abnormal   Collection Time: 11/21/24 10:44 AM  Result Value Ref Range   Glucose 90 70 - 99 mg/dL   BUN 9 8 - 27 mg/dL   Creatinine, Ser 9.18 0.57 - 1.00 mg/dL   eGFR 78 >40 fO/fpw/8.26   BUN/Creatinine Ratio 11 (L) 12 - 28   Sodium 140 134 - 144 mmol/L   Potassium 4.2 3.5 - 5.2 mmol/L  Chloride 103 96 - 106 mmol/L   CO2 23 20 - 29 mmol/L   Calcium  9.2 8.7 - 10.3 mg/dL   Total Protein 7.2 6.0 - 8.5 g/dL   Albumin 3.5 (L) 3.9 - 4.9 g/dL   Globulin, Total 3.7 1.5 - 4.5 g/dL   Bilirubin Total 0.3 0.0 - 1.2 mg/dL   Alkaline Phosphatase 71 49 - 135 IU/L   AST 20 0 - 40 IU/L   ALT 11 0 - 32 IU/L  CBC with Differential/Platelet     Status: Abnormal   Collection Time: 11/21/24 10:44 AM  Result Value Ref Range   WBC 4.1 3.4 - 10.8 x10E3/uL   RBC 4.41 3.77 - 5.28 x10E6/uL   Hemoglobin 11.6 11.1 - 15.9 g/dL   Hematocrit 61.7 65.9 - 46.6 %   MCV 87 79 - 97 fL   MCH 26.3 (L) 26.6 - 33.0 pg   MCHC 30.4 (L) 31.5 - 35.7 g/dL   RDW 84.3 (H) 88.2 - 84.5 %   Platelets 372 150 - 450 x10E3/uL   Neutrophils 66 Not Estab. %   Lymphs 29 Not Estab. %   Monocytes 3 Not Estab. %   Eos 2 Not Estab. %   Basos 0 Not Estab. %   Neutrophils Absolute 2.7 1.4 - 7.0 x10E3/uL   Lymphocytes Absolute 1.2 0.7 - 3.1 x10E3/uL   Monocytes Absolute 0.1 0.1 - 0.9 x10E3/uL   EOS (ABSOLUTE) 0.1 0.0 - 0.4 x10E3/uL   Basophils Absolute 0.0 0.0 - 0.2 x10E3/uL    Immature Granulocytes 0 Not Estab. %   Immature Grans (Abs) 0.0 0.0 - 0.1 x10E3/uL     RADIOGRAPHIC STUDIES:  US  Venous Img Lower Unilateral Left CLINICAL DATA:  Left leg pain and swelling  EXAM: Left LOWER EXTREMITY VENOUS DOPPLER ULTRASOUND  TECHNIQUE: Gray-scale sonography with compression, as well as color and duplex ultrasound, were performed to evaluate the deep venous system(s) from the level of the common femoral vein through the popliteal and proximal calf veins.  COMPARISON:  None Available.  FINDINGS: VENOUS  Normal compressibility of the common femoral, superficial femoral, and popliteal veins, as well as the visualized calf veins. Visualized portions of profunda femoral vein and great saphenous vein unremarkable. No filling defects to suggest DVT on grayscale or color Doppler imaging. Doppler waveforms show normal direction of venous flow, normal respiratory plasticity and response to augmentation.  Limited views of the contralateral common femoral vein are unremarkable.  OTHER  None.  Limitations: none  IMPRESSION: Negative for left lower extremity DVT.  Electronically Signed   By: Cordella Banner   On: 08/26/2024 10:59   Orders Placed This Encounter  Procedures   CBC with Differential (Cancer Center Only)    Standing Status:   Future    Expiration Date:   11/27/2025   D-dimer, quantitative    Standing Status:   Future    Expiration Date:   11/27/2025     Future Appointments  Date Time Provider Department Center  12/10/2024  1:30 PM Bertrum Rosina HERO, RN [facility] None  02/19/2025 11:00 AM Jolinda Norene HERO, DO [facility] [address]  02/19/2025  2:20 PM Dolphus Reiter, MD [facility] None  05/28/2025 11:00 AM [facility] PHLEBOTOMIST [facility] None  05/28/2025 11:30 AM Janat Tabbert, Chinita, MD [facility] None  06/26/2025  8:40 AM [facility]-ANNUAL WELLNESS VISIT [facility] [address]     This document was completed utilizing engineer, civil (consulting).  Grammatical errors, random word insertions, pronoun errors, and incomplete sentences are an  occasional consequence of this system due to software limitations, ambient noise, and hardware issues. Any formal questions or concerns about the content, text or information contained within the body of this dictation should be directly addressed to the provider for clarification.  "

## 2024-11-27 NOTE — Assessment & Plan Note (Deleted)
 Incidentally found to have features concerning for PE on cardiac imaging, and was sent to ER on 05/11/2024.  She was found to have moderate amount of thrombus in the distal right main pulmonary artery extending to the lobar and segmental branches of the right upper and right lower lobes and borderline heart strain.  Ultrasound of the lower extremities showed left popliteal clot that appeared to be chronic.  She was briefly admitted.  Echocardiogram on 05/12/2024 did not show evidence of right heart strain.  She was treated initially with Lovenox  and transitioned over to Eliquis .   Possible contributing factors include reduced mobility due to osteoarthritis. She does have a family history of blood clots in niece and nephew.  On her consultation with us  on 06/18/2024, thrombophilia workup was pursued.  No evidence of prothrombin gene mutation, factor V Leiden mutation, lupus anticoagulant.  Anticardiolipin antibody IgM was increased at 40 units/mL and IgA was indeterminate at 15 units/mL.  Beta-2  glycoprotein IgG antibody was increased at 28 units.  IgA and IgM were within normal limits.  D-dimer was increased >20.00.   Rest of the hypercoagulable workup was deferred as it can be falsely abnormal given recent thromboembolism.    She was advised to continue Eliquis  5 mg twice daily.   Given the extent of pulmonary embolism, at least 6 months of anticoagulation was recommended.  She had repeat CT angio of the chest on 07/10/2024 which showed no evidence of pulmonary embolism.  Had repeat ultrasound of the left lower extremity on 08/26/2024 in the ED and it showed no evidence of DVT.   Plan to repeat anticardiolipin antibody testing and beta-2  glycoprotein antibody testing, along with completion of rest of the thrombophilia workup on return visit.   Abnormal antiphospholipid antibody tests necessitate continued anticoagulation. She was advised to remain on anticoagulation until further instructions.

## 2024-11-27 NOTE — Assessment & Plan Note (Addendum)
 Incidentally found to have features concerning for PE on cardiac imaging, and was sent to ER on 05/11/2024.  She was found to have moderate amount of thrombus in the distal right main pulmonary artery extending to the lobar and segmental branches of the right upper and right lower lobes and borderline heart strain.  Ultrasound of the lower extremities showed left popliteal clot that appeared to be chronic.  She was briefly admitted.  Echocardiogram on 05/12/2024 did not show evidence of right heart strain.  She was treated initially with Lovenox  and transitioned over to Eliquis .   Possible contributing factors include reduced mobility due to osteoarthritis. She does have a family history of blood clots in niece and nephew.  On her consultation with us  on 06/18/2024, thrombophilia workup was pursued.  No evidence of prothrombin gene mutation, factor V Leiden mutation, lupus anticoagulant.  Anticardiolipin antibody IgM was increased at 40 units/mL and IgA was indeterminate at 15 units/mL.  Beta-2  glycoprotein IgG antibody was increased at 28 units.  IgA and IgM were within normal limits.  D-dimer was increased >20.00.     She was advised to continue Eliquis  5 mg twice daily.   She had repeat CT angio of the chest on 07/10/2024 which showed no evidence of pulmonary embolism.  Had repeat ultrasound of the left lower extremity on 08/26/2024 in the ED and it showed no evidence of DVT.   On 11/21/2024, repeat labs showed persistently elevated anticardiolipin antibodies and IgM was actually increased at 104 U/mL, compared to 40 previously.  Beta-2  glycoprotein antibodies IgG was also increased at 27 consistently.  D-dimer was better but still elevated at 4.84.  There was no evidence of protein C, protein S or Antithrombin III deficiency.  Given persistently elevated anticardiolipin antibodies and beta-2  glycoprotein antibodies, in the context of moderate volume pulmonary embolism in the past, she was advised to remain  on anticoagulation lifelong.  She was agreeable.  Plan to see her in 6 months for follow-up with repeat labs.  If she remains clinically stable, she can be discharged from our office after next visit.

## 2024-11-30 NOTE — Telephone Encounter (Signed)
 Submitted an URGENT appeal to [facility] for ORENCIA  SQ.  Case #: [id number] Appeal phone: [phone] Appeal fax: [phone]

## 2024-11-30 NOTE — Telephone Encounter (Signed)
 Received a fax regarding Prior Authorization from [facility] for ORENCIA  SQ. Authorization has been DENIED because:   Case #: [id number] Appeal phone: [phone] Appeal fax: [phone]

## 2024-12-07 ENCOUNTER — Other Ambulatory Visit (HOSPITAL_COMMUNITY): Payer: Self-pay

## 2024-12-10 ENCOUNTER — Other Ambulatory Visit: Payer: Self-pay

## 2024-12-10 ENCOUNTER — Telehealth: Payer: Self-pay | Admitting: *Deleted

## 2024-12-10 ENCOUNTER — Encounter: Payer: Self-pay | Admitting: *Deleted

## 2024-12-10 NOTE — Progress Notes (Signed)
 Specialty Pharmacy Refill Coordination Note  Jean Howell is a 69 y.o. female contacted today regarding refills of specialty medication(s) Abatacept  (Orencia  [employer])   Patient requested Delivery   Delivery date: 12/18/24   Verified address: [address]  [place]   Medication will be filled on: 12/17/24  Patient aware of $12.65 copay and provided updated cc info, on file.

## 2024-12-10 NOTE — Patient Instructions (Signed)
 Avelina Birk - I am sorry I was unable to reach you today for our scheduled appointment. I work with Jolinda Norene HERO, DO and am calling to support your healthcare needs. Please contact me at [phone] at your earliest convenience. I look forward to speaking with you soon.   Thank you,  Rosina Forte, BSN RN [facility], [facility] RN Care Manager Direct Dial: [phone]  Fax: [phone]

## 2024-12-11 NOTE — Telephone Encounter (Signed)
 Received letter from [facility]. Orencia  denial has been OVERTURNED. Orencia  is APPROVED through 12/05/2025  Case # [id number]  Sherry Pennant, PharmD, MPH, BCPS, CPP Clinical Pharmacist

## 2024-12-13 ENCOUNTER — Ambulatory Visit: Payer: Self-pay

## 2024-12-13 ENCOUNTER — Other Ambulatory Visit: Payer: Self-pay | Admitting: Family Medicine

## 2024-12-13 DIAGNOSIS — T7840XA Allergy, unspecified, initial encounter: Secondary | ICD-10-CM

## 2024-12-13 NOTE — Telephone Encounter (Signed)
 FYI Only or Action Required?: FYI only for provider: would like medication sent in.  Patient was last seen in primary care on 11/21/2024 by Jolinda Norene HERO, DO.  Called Nurse Triage reporting Cough.  Symptoms began several days ago.  Interventions attempted: Nothing.  Symptoms are: unchanged.  Triage Disposition: Home Care  Patient/caregiver understands and will follow disposition?: No, wishes to speak with PCP  Copied from CRM #[id number]. Topic: Clinical - Red Word Triage >> Dec 13, 2024 12:39 PM Larissa S wrote: [facility] that prompted transfer to Nurse Triage: cough w/ mucous Reason for Disposition  Cough with cold symptoms (e.g., runny nose, postnasal drip, throat clearing)  Answer Assessment - Initial Assessment Questions 1. ONSET: When did the cough begin?      3 days ago 3. SPUTUM: Describe the color of your sputum (e.g., none, dry cough; clear, white, yellow, green)     I don't know 5. DIFFICULTY BREATHING: Are you having difficulty breathing? If Yes, ask: How bad is it? (e.g., mild, moderate, severe)      denies 6. FEVER: Do you have a fever? If Yes, ask: What is your temperature, how was it measured, and when did it start?     denies 10. OTHER SYMPTOMS: Do you have any other symptoms? (e.g., runny nose, wheezing, chest pain)       Nose congestion  Pt requesting something called in over the phone.  Protocols used: Cough - Acute Productive-A-AH

## 2024-12-17 ENCOUNTER — Other Ambulatory Visit: Payer: Self-pay

## 2025-01-07 ENCOUNTER — Other Ambulatory Visit (HOSPITAL_COMMUNITY): Payer: Self-pay

## 2025-01-07 ENCOUNTER — Other Ambulatory Visit: Payer: Self-pay | Admitting: Physician Assistant

## 2025-01-07 ENCOUNTER — Other Ambulatory Visit: Payer: Self-pay

## 2025-01-07 DIAGNOSIS — M0579 Rheumatoid arthritis with rheumatoid factor of multiple sites without organ or systems involvement: Secondary | ICD-10-CM

## 2025-01-07 DIAGNOSIS — Z79899 Other long term (current) drug therapy: Secondary | ICD-10-CM

## 2025-01-07 MED ORDER — ORENCIA CLICKJECT 125 MG/ML ~~LOC~~ SOAJ
125.0000 mg | SUBCUTANEOUS | 0 refills | Status: AC
  Filled 2025-01-07: qty 12, 84d supply, fill #0
  Filled 2025-01-08: qty 4, 28d supply, fill #0

## 2025-01-07 NOTE — Telephone Encounter (Signed)
 Last Fill: 10/15/2024  Labs: 11/21/2024 CBC and CMP are stable.   TB Gold: 07/27/2024 Neg    Next Visit: 02/19/2025  Last Visit: 09/13/2024  IK:Myzlfjunpi arthritis involving multiple sites with positive rheumatoid factor   Current Dose per office note 09/13/2024: Orencia  125mg  sq injections once weekly   Okay to refill Orencia ?

## 2025-01-08 ENCOUNTER — Other Ambulatory Visit: Payer: Self-pay

## 2025-01-08 ENCOUNTER — Other Ambulatory Visit (HOSPITAL_COMMUNITY): Payer: Self-pay

## 2025-01-09 ENCOUNTER — Other Ambulatory Visit: Payer: Self-pay | Admitting: Rheumatology

## 2025-01-09 ENCOUNTER — Other Ambulatory Visit: Payer: Self-pay | Admitting: Family Medicine

## 2025-01-09 DIAGNOSIS — E7800 Pure hypercholesterolemia, unspecified: Secondary | ICD-10-CM

## 2025-01-09 NOTE — Telephone Encounter (Signed)
 Last Fill: 10/10/2024  Labs: 121/17/2025 CBC and CMP are stable.   Next Visit: 02/20/2024  Last Visit: 09/13/2024  DX: Rheumatoid arthritis involving multiple sites with positive rheumatoid factor   Current Dose per office note 09/13/2024: Arava  20 mg 1 tablet by mouth daily   Okay to refill Arava  ?

## 2025-01-09 NOTE — Telephone Encounter (Signed)
 Last Fill: 10/10/2024  Labs: 11/21/2024 CBC and CMP are stable.   Next Visit: 02/19/2025  Last Visit: 09/13/2024  DX: Rheumatoid arthritis involving multiple sites with positive rheumatoid factor   Current Dose per office note on 09/13/2024: Arava  20 mg 1 tablet by mouth daily.   Okay to refill Arava  ?

## 2025-01-10 ENCOUNTER — Other Ambulatory Visit: Payer: Self-pay

## 2025-01-11 ENCOUNTER — Other Ambulatory Visit: Payer: Self-pay | Admitting: Nurse Practitioner

## 2025-02-19 ENCOUNTER — Ambulatory Visit: Admitting: Rheumatology

## 2025-02-19 ENCOUNTER — Ambulatory Visit: Payer: Self-pay | Admitting: Family Medicine

## 2025-05-28 ENCOUNTER — Inpatient Hospital Stay: Admitting: Oncology

## 2025-05-28 ENCOUNTER — Inpatient Hospital Stay

## 2025-06-26 ENCOUNTER — Ambulatory Visit: Payer: Self-pay
